# Patient Record
Sex: Female | Born: 1940
Health system: Southern US, Community
[De-identification: ages and names within clinical notes are randomized; demographics above are authoritative.]

## PROBLEM LIST (undated history)

## (undated) DIAGNOSIS — R413 Other amnesia: Secondary | ICD-10-CM

## (undated) DIAGNOSIS — M8580 Other specified disorders of bone density and structure, unspecified site: Secondary | ICD-10-CM

## (undated) DIAGNOSIS — E538 Deficiency of other specified B group vitamins: Secondary | ICD-10-CM

## (undated) DIAGNOSIS — K760 Fatty (change of) liver, not elsewhere classified: Secondary | ICD-10-CM

## (undated) DIAGNOSIS — T7840XA Allergy, unspecified, initial encounter: Secondary | ICD-10-CM

## (undated) DIAGNOSIS — E785 Hyperlipidemia, unspecified: Secondary | ICD-10-CM

## (undated) DIAGNOSIS — M1990 Unspecified osteoarthritis, unspecified site: Secondary | ICD-10-CM

## (undated) DIAGNOSIS — C8590 Non-Hodgkin lymphoma, unspecified, unspecified site: Secondary | ICD-10-CM

## (undated) DIAGNOSIS — K529 Noninfective gastroenteritis and colitis, unspecified: Secondary | ICD-10-CM

## (undated) DIAGNOSIS — K5790 Diverticulosis of intestine, part unspecified, without perforation or abscess without bleeding: Secondary | ICD-10-CM

## (undated) DIAGNOSIS — R011 Cardiac murmur, unspecified: Secondary | ICD-10-CM

## (undated) DIAGNOSIS — C801 Malignant (primary) neoplasm, unspecified: Secondary | ICD-10-CM

## (undated) DIAGNOSIS — G35 Multiple sclerosis: Secondary | ICD-10-CM

## (undated) DIAGNOSIS — K2970 Gastritis, unspecified, without bleeding: Secondary | ICD-10-CM

## (undated) DIAGNOSIS — B9681 Helicobacter pylori [H. pylori] as the cause of diseases classified elsewhere: Secondary | ICD-10-CM

## (undated) DIAGNOSIS — I10 Essential (primary) hypertension: Secondary | ICD-10-CM

## (undated) DIAGNOSIS — K219 Gastro-esophageal reflux disease without esophagitis: Secondary | ICD-10-CM

## (undated) DIAGNOSIS — L309 Dermatitis, unspecified: Secondary | ICD-10-CM

## (undated) HISTORY — DX: Cardiac murmur, unspecified: R01.1

## (undated) HISTORY — DX: Allergy, unspecified, initial encounter: T78.40XA

## (undated) HISTORY — DX: Noninfective gastroenteritis and colitis, unspecified: K52.9

## (undated) HISTORY — DX: Deficiency of other specified B group vitamins: E53.8

## (undated) HISTORY — DX: Diverticulosis of intestine, part unspecified, without perforation or abscess without bleeding: K57.90

## (undated) HISTORY — DX: Fatty (change of) liver, not elsewhere classified: K76.0

## (undated) HISTORY — PX: ROTATOR CUFF REPAIR: SHX139

## (undated) HISTORY — DX: Other specified disorders of bone density and structure, unspecified site: M85.80

## (undated) HISTORY — PX: CERVICAL SPINE SURGERY: SHX589

## (undated) HISTORY — DX: Dermatitis, unspecified: L30.9

## (undated) HISTORY — PX: HEMORRHOID SURGERY: SHX153

## (undated) HISTORY — DX: Gastritis, unspecified, without bleeding: K29.70

## (undated) HISTORY — PX: CHOLECYSTECTOMY: SHX55

## (undated) HISTORY — DX: Helicobacter pylori (H. pylori) as the cause of diseases classified elsewhere: B96.81

## (undated) HISTORY — PX: TUBAL LIGATION: SHX77

## (undated) HISTORY — DX: Other amnesia: R41.3

## (undated) HISTORY — DX: Gastro-esophageal reflux disease without esophagitis: K21.9

## (undated) HISTORY — DX: Essential (primary) hypertension: I10

## (undated) HISTORY — DX: Unspecified osteoarthritis, unspecified site: M19.90

## (undated) HISTORY — DX: Hyperlipidemia, unspecified: E78.5

## (undated) HISTORY — DX: Multiple sclerosis: G35

## (undated) HISTORY — PX: SPINAL FUSION: SHX223

---

## 1988-01-31 ENCOUNTER — Encounter: Payer: Self-pay | Admitting: Internal Medicine

## 1997-10-01 ENCOUNTER — Emergency Department (HOSPITAL_COMMUNITY): Admission: EM | Admit: 1997-10-01 | Discharge: 1997-10-01 | Payer: Self-pay | Admitting: Emergency Medicine

## 1998-02-22 ENCOUNTER — Other Ambulatory Visit: Admission: RE | Admit: 1998-02-22 | Discharge: 1998-02-22 | Payer: Self-pay | Admitting: Obstetrics and Gynecology

## 1998-08-21 ENCOUNTER — Ambulatory Visit (HOSPITAL_COMMUNITY): Admission: RE | Admit: 1998-08-21 | Discharge: 1998-08-21 | Payer: Self-pay | Admitting: Neurosurgery

## 1998-08-21 ENCOUNTER — Encounter: Payer: Self-pay | Admitting: Neurosurgery

## 1998-09-04 ENCOUNTER — Ambulatory Visit (HOSPITAL_COMMUNITY): Admission: RE | Admit: 1998-09-04 | Discharge: 1998-09-04 | Payer: Self-pay | Admitting: Neurosurgery

## 1998-09-04 ENCOUNTER — Encounter: Payer: Self-pay | Admitting: Neurosurgery

## 1998-09-27 ENCOUNTER — Ambulatory Visit (HOSPITAL_COMMUNITY): Admission: RE | Admit: 1998-09-27 | Discharge: 1998-09-27 | Payer: Self-pay | Admitting: Neurology

## 1999-03-18 ENCOUNTER — Other Ambulatory Visit: Admission: RE | Admit: 1999-03-18 | Discharge: 1999-03-18 | Payer: Self-pay | Admitting: Obstetrics and Gynecology

## 1999-03-22 ENCOUNTER — Encounter: Admission: RE | Admit: 1999-03-22 | Discharge: 1999-03-22 | Payer: Self-pay | Admitting: Obstetrics and Gynecology

## 1999-03-22 ENCOUNTER — Encounter: Payer: Self-pay | Admitting: Obstetrics and Gynecology

## 2000-01-16 ENCOUNTER — Encounter: Admission: RE | Admit: 2000-01-16 | Discharge: 2000-01-16 | Payer: Self-pay | Admitting: Family Medicine

## 2000-01-16 ENCOUNTER — Encounter: Payer: Self-pay | Admitting: Family Medicine

## 2000-02-24 ENCOUNTER — Other Ambulatory Visit: Admission: RE | Admit: 2000-02-24 | Discharge: 2000-02-24 | Payer: Self-pay | Admitting: Obstetrics and Gynecology

## 2000-03-23 ENCOUNTER — Encounter: Payer: Self-pay | Admitting: Obstetrics and Gynecology

## 2000-03-23 ENCOUNTER — Encounter: Admission: RE | Admit: 2000-03-23 | Discharge: 2000-03-23 | Payer: Self-pay | Admitting: Obstetrics and Gynecology

## 2001-02-26 ENCOUNTER — Other Ambulatory Visit: Admission: RE | Admit: 2001-02-26 | Discharge: 2001-02-26 | Payer: Self-pay | Admitting: Obstetrics and Gynecology

## 2001-03-25 ENCOUNTER — Encounter: Admission: RE | Admit: 2001-03-25 | Discharge: 2001-03-25 | Payer: Self-pay | Admitting: Obstetrics and Gynecology

## 2001-03-25 ENCOUNTER — Encounter: Payer: Self-pay | Admitting: Obstetrics and Gynecology

## 2001-04-05 ENCOUNTER — Encounter: Payer: Self-pay | Admitting: Internal Medicine

## 2003-03-02 ENCOUNTER — Ambulatory Visit (HOSPITAL_COMMUNITY): Admission: RE | Admit: 2003-03-02 | Discharge: 2003-03-02 | Payer: Self-pay | Admitting: Orthopedic Surgery

## 2003-03-02 ENCOUNTER — Ambulatory Visit (HOSPITAL_BASED_OUTPATIENT_CLINIC_OR_DEPARTMENT_OTHER): Admission: RE | Admit: 2003-03-02 | Discharge: 2003-03-02 | Payer: Self-pay | Admitting: Orthopedic Surgery

## 2004-05-03 ENCOUNTER — Ambulatory Visit: Payer: Self-pay | Admitting: Internal Medicine

## 2004-05-15 ENCOUNTER — Ambulatory Visit: Payer: Self-pay | Admitting: Internal Medicine

## 2004-05-29 ENCOUNTER — Ambulatory Visit: Payer: Self-pay | Admitting: Internal Medicine

## 2004-07-17 ENCOUNTER — Ambulatory Visit: Payer: Self-pay | Admitting: Internal Medicine

## 2004-09-06 ENCOUNTER — Ambulatory Visit: Payer: Self-pay | Admitting: Internal Medicine

## 2004-09-10 ENCOUNTER — Ambulatory Visit: Payer: Self-pay | Admitting: Internal Medicine

## 2004-10-17 ENCOUNTER — Ambulatory Visit: Payer: Self-pay | Admitting: Internal Medicine

## 2004-12-09 ENCOUNTER — Ambulatory Visit: Payer: Self-pay | Admitting: Internal Medicine

## 2005-02-06 ENCOUNTER — Ambulatory Visit: Payer: Self-pay | Admitting: Internal Medicine

## 2007-05-12 ENCOUNTER — Ambulatory Visit: Payer: Self-pay | Admitting: Internal Medicine

## 2007-05-27 ENCOUNTER — Encounter: Payer: Self-pay | Admitting: Internal Medicine

## 2007-05-27 ENCOUNTER — Ambulatory Visit: Payer: Self-pay | Admitting: Internal Medicine

## 2007-07-01 ENCOUNTER — Ambulatory Visit: Payer: Self-pay | Admitting: Internal Medicine

## 2008-04-14 HISTORY — PX: BLADDER SURGERY: SHX569

## 2008-04-14 HISTORY — PX: ABDOMINAL HYSTERECTOMY: SHX81

## 2008-11-17 ENCOUNTER — Telehealth: Payer: Self-pay | Admitting: Internal Medicine

## 2008-11-28 DIAGNOSIS — K5730 Diverticulosis of large intestine without perforation or abscess without bleeding: Secondary | ICD-10-CM | POA: Insufficient documentation

## 2008-11-28 DIAGNOSIS — K7689 Other specified diseases of liver: Secondary | ICD-10-CM | POA: Insufficient documentation

## 2008-11-28 DIAGNOSIS — K5289 Other specified noninfective gastroenteritis and colitis: Secondary | ICD-10-CM | POA: Insufficient documentation

## 2008-11-28 DIAGNOSIS — G35 Multiple sclerosis: Secondary | ICD-10-CM | POA: Insufficient documentation

## 2008-11-28 DIAGNOSIS — E785 Hyperlipidemia, unspecified: Secondary | ICD-10-CM | POA: Insufficient documentation

## 2008-11-28 DIAGNOSIS — Z8711 Personal history of peptic ulcer disease: Secondary | ICD-10-CM | POA: Insufficient documentation

## 2008-12-04 ENCOUNTER — Ambulatory Visit: Payer: Self-pay | Admitting: Internal Medicine

## 2008-12-04 DIAGNOSIS — R6889 Other general symptoms and signs: Secondary | ICD-10-CM | POA: Insufficient documentation

## 2008-12-06 ENCOUNTER — Ambulatory Visit (HOSPITAL_COMMUNITY): Admission: RE | Admit: 2008-12-06 | Discharge: 2008-12-06 | Payer: Self-pay | Admitting: Internal Medicine

## 2008-12-06 ENCOUNTER — Telehealth: Payer: Self-pay | Admitting: Internal Medicine

## 2008-12-13 ENCOUNTER — Encounter (INDEPENDENT_AMBULATORY_CARE_PROVIDER_SITE_OTHER): Payer: Self-pay | Admitting: Obstetrics and Gynecology

## 2008-12-13 ENCOUNTER — Ambulatory Visit (HOSPITAL_COMMUNITY): Admission: RE | Admit: 2008-12-13 | Discharge: 2008-12-14 | Payer: Self-pay | Admitting: Obstetrics and Gynecology

## 2009-11-27 ENCOUNTER — Emergency Department (HOSPITAL_BASED_OUTPATIENT_CLINIC_OR_DEPARTMENT_OTHER): Admission: EM | Admit: 2009-11-27 | Discharge: 2009-11-28 | Payer: Self-pay | Admitting: Emergency Medicine

## 2009-11-28 ENCOUNTER — Ambulatory Visit: Payer: Self-pay | Admitting: Diagnostic Radiology

## 2009-12-27 ENCOUNTER — Encounter: Admission: RE | Admit: 2009-12-27 | Discharge: 2009-12-27 | Payer: Self-pay | Admitting: Neurology

## 2010-01-01 ENCOUNTER — Telehealth (INDEPENDENT_AMBULATORY_CARE_PROVIDER_SITE_OTHER): Payer: Self-pay | Admitting: *Deleted

## 2010-05-14 NOTE — Progress Notes (Signed)
  Phone Note Other Incoming   Request: Send information Summary of Call: Request for records received from [place] [facility]. Request forwarded to [facility] for all of patient's records.

## 2010-06-28 LAB — DIFFERENTIAL
Eosinophils Absolute: 0.1 10*3/uL (ref 0.0–0.7)
Eosinophils Relative: 2 % (ref 0–5)
Lymphs Abs: 1.4 10*3/uL (ref 0.7–4.0)
Monocytes Absolute: 0.5 10*3/uL (ref 0.1–1.0)
Monocytes Relative: 7 % (ref 3–12)
Neutrophils Relative %: 73 % (ref 43–77)

## 2010-06-28 LAB — CBC
HCT: 35.6 % — ABNORMAL LOW (ref 36.0–46.0)
Hemoglobin: 12.4 g/dL (ref 12.0–15.0)
MCH: 32.6 pg (ref 26.0–34.0)
MCV: 93.3 fL (ref 78.0–100.0)
RBC: 3.82 MIL/uL — ABNORMAL LOW (ref 3.87–5.11)

## 2010-06-28 LAB — BASIC METABOLIC PANEL
CO2: 23 mEq/L (ref 19–32)
Chloride: 105 mEq/L (ref 96–112)
GFR calc Af Amer: >60 mL/min (ref >60)
Glucose, Bld: 170 mg/dL — ABNORMAL HIGH (ref 70–99)
Potassium: 4.2 mEq/L (ref 3.5–5.1)
Sodium: 140 mEq/L (ref 135–145)

## 2010-06-28 LAB — URINALYSIS, ROUTINE W REFLEX MICROSCOPIC
Bilirubin Urine: NEGATIVE
Glucose, UA: NEGATIVE mg/dL
Hgb urine dipstick: NEGATIVE
Specific Gravity, Urine: 1.013 (ref 1.005–1.030)

## 2010-07-19 LAB — CBC
MCHC: 33.8 g/dL (ref 30.0–36.0)
MCV: 93.6 fL (ref 78.0–100.0)
MCV: 94.2 fL (ref 78.0–100.0)
Platelets: 237 10*3/uL (ref 150–400)
RBC: 3.64 MIL/uL — ABNORMAL LOW (ref 3.87–5.11)
RDW: 13.3 % (ref 11.5–15.5)
WBC: 6.8 10*3/uL (ref 4.0–10.5)

## 2010-07-20 LAB — DIFFERENTIAL
Basophils Absolute: 0 10*3/uL (ref 0.0–0.1)
Basophils Relative: 0 % (ref 0–1)
Eosinophils Relative: 4 % (ref 0–5)
Monocytes Absolute: 0.3 10*3/uL (ref 0.1–1.0)

## 2010-07-20 LAB — COMPREHENSIVE METABOLIC PANEL
AST: 39 U/L — ABNORMAL HIGH (ref 0–37)
Albumin: 3.7 g/dL (ref 3.5–5.2)
Alkaline Phosphatase: 64 U/L (ref 39–117)
Chloride: 101 mEq/L (ref 96–112)
GFR calc Af Amer: >60 mL/min (ref >60)
Potassium: 3.6 mEq/L (ref 3.5–5.1)
Total Bilirubin: 0.7 mg/dL (ref 0.3–1.2)

## 2010-07-20 LAB — CBC
HCT: 38.4 % (ref 36.0–46.0)
Platelets: 240 10*3/uL (ref 150–400)
WBC: 4.7 10*3/uL (ref 4.0–10.5)

## 2010-08-27 NOTE — Op Note (Signed)
NAMECARMYN, [name]               ACCOUNT NO.:  [id number]   MEDICAL RECORD NO.:  [id number]          PATIENT TYPE:  OIB   LOCATION:  9313                          FACILITY:  [facility]   PHYSICIAN:  Lucille Passy. Ulanda Edison, M.D. DATE OF BIRTH:  1940/04/26   DATE OF PROCEDURE:  12/13/2008  DATE OF DISCHARGE:                               OPERATIVE REPORT   PREOPERATIVE DIAGNOSES:  Uterine prolapse, fourth-degree cystocele, and  second-degree rectocele.   POSTOPERATIVE DIAGNOSES:  Uterine prolapse, fourth-degree cystocele, and  second-degree rectocele.   OPERATION:  Vaginal hysterectomy, A and P repair.   OPERATOR:  Lucille Passy. Ulanda Edison, MD   ASSISTANT:  Clarene Duke, MD   ANESTHESIA:  General anesthesia.   PROCEDURE IN DETAIL:  The patient was brought to the operating room and  placed under satisfactory general anesthesia, and placed in lithotomy  position in the [place] stirrups.  The vulva, vagina, and perineum were  prepped with Betadine solution.  The bladder was catheterized after the  urethra was prepped with Betadine and the Foley catheter was hooked to  straight drain.  A time-out was done, verifying the patient's name and  operation, allergies, and potential complications.  The area was then  draped as a sterile field.  Exam revealed the preop findings, the cul-de-  sac still felt somewhat thick, so I wanted to be especially careful to  avoid any injury to the rectum.  A weighted speculum was placed along  with a Deaver.  Cervix was grasped with a Lahey clamps on the anterior  and posterior lips.  A dilute solution of Neo-Synephrine was injected at  the cervicovaginal junction and a circumferential incision was made  around the cervix.  The bladder was pushed ahead, I could not find a  perfect plain there, so I dissected posteriorly, saw the cul-de-sac  bulging, entered it without difficulty, clamped, cut, and suture ligated  the uterosacral ligaments and held them.  I clamped,  cut, and suture  ligated the cardinal ligaments.  I then was able to find the anterior  peritoneum without difficulty, entered it, retracted the bladder away,  and then continued up the parametrium tissues, clamping, cutting, and  suture ligating, and then inverted the uterus through the incision, and  the cul-de-sac clamped across the upper pedicles, doubly suture ligated  them and held.  I then sutured the posterior vaginal cuff with a running  suture of 0 Vicryl from uterosacral ligament through uterosacral  ligament including the pelvic peritoneum with the vaginal mucosa.  I  then placed an additional suture above the uterosacral ligaments through  the uterosacral ligaments and pelvic peritoneum.  After I done a  pursestring suture of #1 Vicryl through the anterior peritoneum, the  left upper pedicle, uterosacral ligament, pelvic peritoneum, right  uterosacral ligament, right upper pedicle, and back to the anterior  pelvic peritoneum.  Before I tied this down, I tied the suture through  the uterosacral ligaments down and held it to exited through the vagina  later.  I then pushed all material away from the peritoneal closure  site, tied it  down, and cut away the upper pedicles, and the closure  suture.  I then undermined the anterior vaginal mucosa all the way to  the urethral meatus, I developed a cystocele, which was fourth-degree,  it easily developed on the left side and the right side, finding the  proper plain was more difficult, but ultimately did find the exact  plain, created the cystocele without difficulty, then imbricated the  cystocele with multiple interrupted sutures of 0 Vicryl, cut redundant  vaginal mucosa way and then closed the vaginal mucosa in the midline  with interrupted figure of eight sutures of 0 Vicryl.  When I got down  to the inferior most portion of the cuff, I took suture through the  uterosacral ligaments and exited through the vaginal mucosa and tied  it  down to elevate the vaginal cuff up to the uterosacral ligaments.  Before closing the peritoneum, I did search for the ovaries, I can see  both tubes, the right ovary was palpable and was small; the left ovary I  never could palpate, I kept feeling when I thought were bowel  diverticula that I felt preoperatively, but I never could find the  ovary.  The posterior fourchette was then cut across the posterior  vaginal mucosa, it was undermined almost to the cuff.  I developed the  rectocele, fulgurated some bleeders with the Bovie and I sutured them  with 3-0 Vicryl.  Then, I did a rectal exam to ensure that they had been  no rectal injury and then imbricated over the rectocele with several  sutures of 0 Vicryl and cut redundant vaginal mucosa way and reunited  the vaginal mucosa in the midline with interrupted figure-of-eight  sutures of 0 Vicryl and reapproximated the perineal tissue with 3-0  Vicryl.  There was one bleeder close to the upper most part of the  anterior vaginal mucosa closure that I sutured with 3-0 Vicryl for  complete hemostasis.  I then packed the vaginal tissue with 2-inch  Iodoform dressing or packing and terminated the procedure.   Blood loss was estimated by Pathology staff at 150 mL.  I estimated 200  mL.  The patient was then returned to recovery in satisfactory  condition.      Lucille Passy. Ulanda Edison, M.D.  Electronically Signed     TFH/MEDQ  D:  12/13/2008  T:  12/13/2008  Job:  [id number]

## 2010-08-27 NOTE — H&P (Signed)
NAMEEDITHA, BRIDGEFORTH               ACCOUNT NO.:  [id number]   MEDICAL RECORD NO.:  [id number]          PATIENT TYPE:  AMB   LOCATION:  [place]                           FACILITY:  [facility]   PHYSICIAN:  Lucille Passy. Ulanda Edison, M.D. DATE OF BIRTH:  [birth year]   DATE OF ADMISSION:  12/13/2008  DATE OF DISCHARGE:                              HISTORY & PHYSICAL   PRESENT ILLNESS:  This is a 70 year old white female para 3-0-0-3 who is  admitted to the hospital for vaginal hysterectomy, A and P repair  because of uterine prolapse, fourth-degree cystocele, and smaller  rectocele.  This patient has had a relatively benign life with regard to  gynecologic abnormalities.  She did have lichen sclerosis diagnosed by  biopsy in the past, although otherwise has done well.  In June 2010, she  presented to my office complaining that she had more of a problem with  her cystocele in the last few days.  She had always had some stress  urinary incontinence for years, but this was not a problem.  She stated  that her urine flow was slow and after finishing voiding, she would  always have more urine to evacuate.  She does have a history of multiple  sclerosis.  Because of her somewhat minor urinary complaints, I asked  Dr. Diona Fanti to see her regarding whether a saline would be necessary  at the time of the vaginal hysterectomy and A and P repair.  After a  thorough investigation, he felt that a sling was not necessary.  The  patient is admitted now for correction of her cystocele and prolapse.   ALLERGIES:  MORPHINE, SULFA, MACROBID, and KEFLEX.   PAST MEDICAL HISTORY:  Multiple sclerosis.  Colitis.  She has also been  recently diagnosed with a problem with her esophagus that causes sort of  a balloon shape to the esophagus.   OPERATIONS:  Cholecystectomy, tubal ligation, D&C, hemorrhoidectomy.  Tobacco none.   MEDICATIONS:  Vitamin D, Boniva, betaserone, Tricor, gabapentin,  lovastatin, ranitidine, Allegra.   FAMILY HISTORY:  Mother is 15 with no health problems.  Father died of a  kidney infection and prostate problems.  Two brothers have high  cholesterol, 1 brother with high blood pressure.  One sister with high  cholesterol.   PHYSICAL EXAMINATION:  A well-developed, well-nourished white female in  no distress.  HEENT: Normal.  Blood pressure 140/90, pulse 80.  Weight 146-3/4 pounds.  NECK:  Supple without thyromegaly.  HEART:  Normal size and sounds.  No murmurs.  LUNGS:  Clear to auscultation.  Abdomen is soft, nontender.  The vulva and vagina are clean.  There is a fourth-degree cystocele.  The cervix is clean.  The cervix prolapses close to the introitus.  Pap  smear in December 2008 was normal, and the uterus itself is anterior not  enlarged.  There is thickening posterior to the cervix that is thought  to be secondary to diverticular disease.  Rectal exam is negative.   ADMITTING IMPRESSION:  Fourth-degree cystocele, third-degree uterine  prolapse, smaller rectocele.  Multiple sclerosis.   The patient is  admitted for vaginal hysterectomy, A and P repair.  She  has been counseled about the risks of surgery, including but not limited  to heart attack, stroke, pulmonary embolus, wound disruption, hemorrhage  with need for  reoperation and/or transfusion, fistula formation, nerve injury.  She  understands that the surgery will cause shortening and narrowing of the  vagina and might cause problems with intercourse.  She and her husband  have been counseled about this, and they understand and agree to  proceed.      Lucille Passy. Ulanda Edison, M.D.  Electronically Signed     TFH/MEDQ  D:  12/12/2008  T:  12/12/2008  Job:  [id number]

## 2010-08-27 NOTE — Assessment & Plan Note (Signed)
[facility] OFFICE NOTE   NAME:Beidleman, JORDAN CARAVEO                      MRN:          [id number]  DATE:07/01/2007                            DOB:          30-Oct-1940    The patient is a 70 year old white female with inflammatory bowel  disease positive for IBD markers and pseudopolyps on colonoscopy on  May 27, 2007.  She had prior colonoscopies in 2002 and 2006 which  also showed pseudopolyps showing inflammatory tissue and small ileocecal  valve ulceration.  She was in the past symptomatically relieved with  systemic steroids, but this has been discontinued.  She has multiple  sclerosis followed by Dr. Jacolyn Reedy.  The patient has been under  reasonable control with Betaseron and gabapentin.  She in the past was  on high dose steroids and during those times her colitis improved  remarkably well.  I have put her on Entocort 9 mg a day after her most  recent colonoscopy and she showed improvement in her symptoms of  diarrhea.  She is down to 6 mg a day and still is not having much  diarrhea at all.  She is planning to go to 3 mg a day on April 12.  Unfortunately the medication is quite expensive and so we probably will  not be able to keep her on it indefinitely.  Another medical problem is  a positive family history of colon cancer in a direct relative.  The  patient has fatty liver on upper abdominal ultrasound and she was  treated for H. Pylori gastropathy in the past.   PAST MEDICAL HISTORY:  Significant for cholecystectomy in 1986 and  hemorrhoidectomy around 2005.   PHYSICAL EXAMINATION:  VITAL SIGNS:  Blood pressure 128/80, pulse 64,  and weight 150 pounds.  GENERAL:  She was alert and oriented in no distress.  LUNGS:  Clear to auscultation.  HEART:  Normal S1 and normal S2.  ABDOMEN:  Soft and somewhat hypotonic with decreased muscle tone, but  normal active bowel sounds.  Liver edge was about 1-2 cm  below right  costal margin.  It was nontender and not enlarged.  There was no  tenderness.   RECOMMENDATIONS:  Not repeated.   IMPRESSION:  A 70 year old white female with inflammatory bowel disease,  low grade colitis, and multiple sclerosis improved on Entocort, but the  medication is quite expensive.   PLAN:  1. Switch to Asacol 400 mg three tablets twice a day longterm.  2. Consider using 6-mercaptopurine as immunomodulator for control of      her diarrhea.  3. Add probiotics, Align one daily.  4. I would like to see her in three months after she tapers off her      Entocort and starts the      Asacol.  If the Asacol does not completely control her symptoms, we      will try her on 6-mercaptopurine.     Lowella Bandy. Olevia Perches, MD  Electronically Signed    DMB/MedQ  DD: 07/01/2007  DT: 07/01/2007  Job #: [id number]   cc:   Josph Macho  Rosalva Ferron, M.D.  Catherine A. Jacolyn Reedy, M.D.

## 2010-08-27 NOTE — Discharge Summary (Signed)
NAMEKATARA, GRINER               ACCOUNT NO.:  [id number]   MEDICAL RECORD NO.:  [id number]          PATIENT TYPE:  OIB   LOCATION:  9313                          FACILITY:  [facility]   PHYSICIAN:  Lucille Passy. Ulanda Edison, M.D. DATE OF BIRTH:  [birth year]   DATE OF ADMISSION:  12/13/2008  DATE OF DISCHARGE:  12/14/2008                               DISCHARGE SUMMARY   This is a 70 year old white female admitted for vaginal hysterectomy, A  and P repair because of a large cystocele, uterine prolapse and smaller  rectocele.  She underwent a vaginal hysterectomy, A and P repair, on  December 13, 2008, by Dr. Ulanda Edison with Dr. Willis Modena assisting under  general anesthesia with blood loss of about 200 mL.  Postoperatively,  she did well.  The catheter was removed on the first postop day.  She  has voided, but residual urine by scan was approximately 245 mL on the  first postop day.  The patient is ready for discharge.  She has  ambulated well without difficulty, tolerated a regular diet, but she  will go home with the catheter if a repeat scan is greater than 200 mL.   LABORATORY DATA:  A comprehensive metabolic profile that was not  fasting, showed a glucose of 147, SGOT of 39, sodium of 134.  Initial  hemoglobin 13, hematocrit 38.4, white count 47,100, platelet count  240,000.  Followup hemoglobins 11.5 and 11.3.   FINAL DIAGNOSES:  Fourth-degree cystocele, smaller rectocele, uterine  prolapse.   OPERATION:  Vaginal hysterectomy, A and P repair.   FINAL CONDITION:  Improved.   INSTRUCTIONS:  Our regular discharge instructions, report any  temperature elevation greater than 100.4 degrees, report any heavy  vaginal bleeding, avoid vaginal entrance, avoid heavy lifting.  The  patient will be discharged with a catheter if her residual urine is  greater than 200 mL on repeat scanning.  She will be discharged without  a catheter if the residual urine is less than 200 mL.  She is given a  prescription  of Percocet 5/335, 30 tablets, 1 every 4-6 hours as needed  for pain and if she goes home with a catheter, she will be given Cipro  500 mg by mouth twice a day for 7 days.  She is to continue all the  medication she came to the hospital with.  She will return in 5 days  with a catheter or 10-14 days without it.      Lucille Passy. Ulanda Edison, M.D.  Electronically Signed     TFH/MEDQ  D:  12/14/2008  T:  12/15/2008  Job:  [id number]

## 2010-08-30 NOTE — Op Note (Signed)
[name], [name]                         ACCOUNT NO.:  [id number]   MEDICAL RECORD NO.:  [id number]                   PATIENT TYPE:  AMB   LOCATION:  [place]                                  FACILITY:  [facility]   PHYSICIAN:  Ninetta Lights, M.D.              DATE OF BIRTH:  1940/11/18   DATE OF PROCEDURE:  03/02/2003  DATE OF DISCHARGE:                                 OPERATIVE REPORT   PREOPERATIVE DIAGNOSIS:  Marked symptomatic triggering, left thumb.   POSTOPERATIVE DIAGNOSES:  Marked symptomatic triggering, left thumb with  marked intratendinous abrasive partial tearing, flexor tendons thumb.   OPERATIVE PROCEDURE:  Release A1 pulley to obliterate triggering left thumb.  Tenosynovectomy and debridement of flexor tendons.   SURGEON:  Ninetta Lights, M.D.   ASSISTANT:  Aaron Edelman D. Petrarca, P.A.-C.   ANESTHESIA:  A. regional.   SPECIMENS:  None.   CULTURES:  None.   COMPLICATIONS:  None.   DRESSINGS:  Soft compressive with bulky hand dressing and thumb spica  splint.   PROCEDURE:  The patient was brought to the operating room and after adequate  anesthesia had been obtained, prepped and draped in usual sterile fashion.  Transverse incision over the volar aspect of the thumb A1 pulley.  The skin  and subcutaneous tissue divided.  Blunt dissection exposing the A1 pulley.  Marked inflammatory debris.  Neurovascular structures identified and  protected.  The A1 pulley was then released in its entirety with a small  section excised.  Below this, there was marked attritional changes and  tenosynovitis along the flexor tendons.  All the small flaps and frayed  tears on the tendon debrided.  Tenosynovectomy throughout.  The remaining  tendons, which had more than 75% of continuity intact were then inspected.  She could actively extend and flex, confirming all obliteration of  triggering and confirming good continuity of remaining tendons.  Wound  irrigated.  Skin closed with  nylon.  The margins of the wound were injected  with Marcaine.  Sterile compressive dressing with thumb spica splint and  bulky hand dressing applied.  Anesthesia reversed.  Brought to Recovery  Room.  Tolerated surgery well.  No complications.                                               Ninetta Lights, M.D.    DFM/MEDQ  D:  03/02/2003  T:  03/03/2003  Job:  [id number]

## 2011-03-13 ENCOUNTER — Ambulatory Visit (INDEPENDENT_AMBULATORY_CARE_PROVIDER_SITE_OTHER): Payer: Medicare Other | Admitting: Internal Medicine

## 2011-03-13 ENCOUNTER — Encounter: Payer: Self-pay | Admitting: Internal Medicine

## 2011-03-13 DIAGNOSIS — R109 Unspecified abdominal pain: Secondary | ICD-10-CM

## 2011-03-13 DIAGNOSIS — R160 Hepatomegaly, not elsewhere classified: Secondary | ICD-10-CM

## 2011-03-13 DIAGNOSIS — R197 Diarrhea, unspecified: Secondary | ICD-10-CM

## 2011-03-13 DIAGNOSIS — K5289 Other specified noninfective gastroenteritis and colitis: Secondary | ICD-10-CM

## 2011-03-13 DIAGNOSIS — R110 Nausea: Secondary | ICD-10-CM

## 2011-03-13 MED ORDER — DICYCLOMINE HCL 10 MG PO CAPS: 10.0000 mg | ORAL_CAPSULE | Freq: Two times a day (BID) | ORAL | Status: DC

## 2011-03-13 NOTE — Patient Instructions (Signed)
We have sent the following medications to your pharmacy for you to pick up at your convenience: Bentyl You have been scheduled for an endoscopy. Please follow written instructions given to you at your visit today. You have been scheduled for an abdominal ultrasound at [facility] (1st floor of hospital) on Monday 03/17/11 at 8:00 am. Please arrive 15 minutes prior to your appointment for registration. Make certain not to have anything to eat or drink 6 hours prior to your appointment. Should you need to reschedule your appointment, please contact radiology at [phone]. CC :Dr Stephanie Acre

## 2011-03-13 NOTE — Progress Notes (Signed)
[name] [birth year] MRN [id number]    History of Present Illness:  This is a 70 year old white female with multiple sclerosis treated with interferon. She also has inflammatory bowel disease and positive IBD markers. She had multiple inflammatory polyps on colonoscopies in 2006 and again in February 2009. She is currently not being treated for her colitis because she did not tolerate mesalamine. She has been experiencing more crampy abdominal pain and some diarrhea. She denies rectal bleeding. She has a history of diverticulitis documented on a CT scan of the abdomen in August 2011 which showed stranding in the left colon consistent with diverticulitis. There is a positive family history of colon cancer. She has a history of mild hepatosplenomegaly on an upper abdominal ultrasound in 2006 and mild fatty liver. She denies alcohol use. She had an upper endoscopy in 1989 with findings of gastritis and duodenitis. She has had some solid food dysphagia. She also has a history of B12 deficiency. She has been on Zantac for reflux. Patient has been concerned about possible liver problems because she has noticed white-colored stool while on a trip last month.   Past Medical History  Diagnosis Date  . Multiple sclerosis   . Gastritis   . Duodenitis   . Helicobacter pylori gastritis   . Hyperlipidemia   . Fatty liver   . Diverticulosis   . Hypertension   . IBD (inflammatory bowel disease)    Past Surgical History  Procedure Date  . Cholecystectomy   . Hemorrhoid surgery   . Tubal ligation   . Spinal fusion   . Rotator cuff repair   . Abdominal hysterectomy 2010  . Bladder surgery 2010    Bladder Tack    reports that she has quit smoking. She has never used smokeless tobacco. She reports that she drinks alcohol. She reports that she does not use illicit drugs. family history includes Breast cancer in her cousin; Colon cancer in her maternal grandmother; Colon polyps in her brother and  mother; Diabetes in her father; and Heart disease in her father. Allergies  Allergen Reactions  . Amoxicillin     REACTION: hives  . Doxycycline     Raw mouth   . Erythromycin     REACTION: weakness  . Morphine   . Sulfonamide Derivatives         Review of Systems: Positive for dysphagia. Negative for chest pain or shortness of breath. Positive for heartburn. Negative for rectal bleeding  The remainder of the 10 point ROS is negative except as outlined in H&P   Physical Exam: General appearance  Well developed, in no distress. Eyes- non icteric. HEENT nontraumatic, normocephalic. Mouth no lesions, tongue papillated, no cheilosis. Neck supple without adenopathy, thyroid not enlarged, no carotid bruits, no JVD. Lungs Clear to auscultation bilaterally. Cor normal S1, normal S2, regular rhythm, no murmur,  quiet precordium. Abdomen: Soft abdomen mildly tender in left lower quadrant. Pulse cholecystectomy scar in right upper quadrant. Mild hepatomegaly with liver edge 2 cm below right costal margin. No splenomegaly. Rectal: liquidy Hemoccult-negative stool. Extremities no pedal edema. Skin no lesions. Neurological alert and oriented x 3. Psychological normal mood and affect.  Assessment and Plan:  Problem #1 crampy abdominal pain. Patient has low-grade inflammatory bowel disease. Patient is intolerant to mesalamine.Shehas never been treated  For any length of time because her disease was found incidentally on a screening colonoscopy and she was never interested in a long term treatment. We will start Bentyl 10 mg by  mouth twice a day. She is up-to-date on her colonoscopy.  Problem #2 dysphagia and heartburn. This may be further investigated. We need to rule out pernicious anemia resulting in B12 deficiency. We also need to rule out esophageal varices and H. pylori. We will schedule an upper endoscopy and possible dilation.  Problem #3 hepatomegaly. This was noted 6 years ago.  Etiology is not clear although it may be simply autoimmune. We will obtain an upper abdominal ultrasound and liver function tests. She had lab work done at dr [employer]  office.   03/13/2011 Delfin Edis

## 2011-03-17 ENCOUNTER — Ambulatory Visit (HOSPITAL_COMMUNITY)
Admission: RE | Admit: 2011-03-17 | Discharge: 2011-03-17 | Disposition: A | Discharge status: Home / Self Care | Payer: Medicare Other | Source: Ambulatory Visit | Attending: Internal Medicine | Admitting: Internal Medicine

## 2011-03-17 DIAGNOSIS — Q619 Cystic kidney disease, unspecified: Secondary | ICD-10-CM | POA: Insufficient documentation

## 2011-03-17 DIAGNOSIS — K7689 Other specified diseases of liver: Secondary | ICD-10-CM | POA: Insufficient documentation

## 2011-03-17 DIAGNOSIS — R109 Unspecified abdominal pain: Secondary | ICD-10-CM | POA: Insufficient documentation

## 2011-03-17 DIAGNOSIS — R110 Nausea: Secondary | ICD-10-CM

## 2011-03-17 NOTE — Progress Notes (Signed)
Left message for patient to call back  

## 2011-03-17 NOTE — Progress Notes (Signed)
Patient advised of fatty liver and that her spleen and liver are no longer enlarged. Patient verbalizes understanding.

## 2011-03-18 ENCOUNTER — Ambulatory Visit (AMBULATORY_SURGERY_CENTER): Payer: Medicare Other | Admitting: Internal Medicine

## 2011-03-18 ENCOUNTER — Encounter: Payer: Self-pay | Admitting: Internal Medicine

## 2011-03-18 DIAGNOSIS — K2940 Chronic atrophic gastritis without bleeding: Secondary | ICD-10-CM

## 2011-03-18 DIAGNOSIS — K2100 Gastro-esophageal reflux disease with esophagitis, without bleeding: Secondary | ICD-10-CM

## 2011-03-18 DIAGNOSIS — R197 Diarrhea, unspecified: Secondary | ICD-10-CM

## 2011-03-18 DIAGNOSIS — K2980 Duodenitis without bleeding: Secondary | ICD-10-CM

## 2011-03-18 DIAGNOSIS — R110 Nausea: Secondary | ICD-10-CM

## 2011-03-18 DIAGNOSIS — R109 Unspecified abdominal pain: Secondary | ICD-10-CM

## 2011-03-18 MED ORDER — SODIUM CHLORIDE 0.9 % IV SOLN: 500.0000 mL | INTRAVENOUS | Status: DC

## 2011-03-18 NOTE — Patient Instructions (Signed)
Read the handout given to you by your recovery room nurse.   You may resume your routine medications today.   Your biopsy results will be mailed to you within two weeks.   If you have any questions, please call us at [phone]. Thank-you.

## 2011-03-18 NOTE — Progress Notes (Signed)
Patient did not have preoperative order for IV antibiotic SSI prophylaxis. (G8918)  Patient did not experience any of the following events: a burn prior to discharge; a fall within the facility; wrong site/side/patient/procedure/implant event; or a hospital transfer or hospital admission upon discharge from the facility. (G8907)  

## 2011-03-19 ENCOUNTER — Telehealth: Payer: Self-pay | Admitting: *Deleted

## 2011-03-19 NOTE — Telephone Encounter (Signed)
CALLED NUMBER GIVEN IN ADMISSION, NO ANSWER, CALLED MOBILE NUMBER, IDENTIFIER ON ANSWERING MACHINE, MESSAGE LEFT FOR PATIENT.

## 2011-03-24 ENCOUNTER — Encounter: Payer: Self-pay | Admitting: Internal Medicine

## 2011-04-11 ENCOUNTER — Encounter: Payer: Self-pay | Admitting: Internal Medicine

## 2012-12-22 ENCOUNTER — Encounter: Payer: Self-pay | Admitting: Neurology

## 2013-01-05 ENCOUNTER — Ambulatory Visit (INDEPENDENT_AMBULATORY_CARE_PROVIDER_SITE_OTHER): Payer: Medicare Other | Admitting: Neurology

## 2013-01-05 ENCOUNTER — Encounter: Payer: Self-pay | Admitting: Neurology

## 2013-01-05 VITALS — BP 119/72 | HR 80 | Wt 142.0 lb

## 2013-01-05 DIAGNOSIS — R413 Other amnesia: Secondary | ICD-10-CM

## 2013-01-05 DIAGNOSIS — Z5181 Encounter for therapeutic drug level monitoring: Secondary | ICD-10-CM

## 2013-01-05 DIAGNOSIS — G35 Multiple sclerosis: Secondary | ICD-10-CM

## 2013-01-05 HISTORY — DX: Other amnesia: R41.3

## 2013-01-05 MED ORDER — MEMANTINE HCL 10 MG PO TABS: 10.0000 mg | ORAL_TABLET | Freq: Two times a day (BID) | ORAL | Status: DC

## 2013-01-05 NOTE — Progress Notes (Signed)
Reason for visit: Multiple sclerosis  [name] is an 72 y.o. female  History of present illness:  Ms. Well is a 72 year old left-handed white female with a history of multiple sclerosis and a history of a mild memory disturbance. The patient had been on an Exelon patch, but she developed problems with diarrhea. The patient has a history of colitis as well, and a history of a low vitamin B12 level. The patient felt that the Exelon patch did help her memory, and when she went off the medication, she has felt a decline in her ability to concentrate and a decline in her general memory. The patient has not noted any definite changes in her functional level with her multiple sclerosis. There have been no new problems with numbness, weakness, coordination, or with balance. The patient denies any new visual disturbances, and she has been followed by her ophthalmologist on the [place]. The patient is tolerating the Gilenya quite well. The patient reports some discomfort and irritation in the medial aspect of the right eye within the last week. The patient has also noted some decrease in hearing of the right ear. This has been noted since December 2013. The patient denies any ear pain. The patient returns for an evaluation.  Past Medical History  Diagnosis Date  . Multiple sclerosis   . Gastritis   . Duodenitis   . Helicobacter pylori gastritis   . Hyperlipidemia   . Fatty liver   . Diverticulosis   . Hypertension   . IBD (inflammatory bowel disease)   . Arthritis, degenerative     both knees  . Memory loss     mild  . Colitis   . Memory difficulty 01/05/2013    Past Surgical History  Procedure Laterality Date  . Cholecystectomy    . Hemorrhoid surgery    . Tubal ligation    . Spinal fusion    . Rotator cuff repair    . Abdominal hysterectomy  2010  . Bladder surgery  2010    Bladder Tack  . Cervical spine surgery      Family History  Problem Relation Age of Onset  .  Diabetes Father   . Heart disease Father   . Breast cancer Cousin   . Colon cancer Maternal Grandmother     ?  . Colon polyps Mother   . Colon polyps Brother   . Hypertension Brother     Social history:  reports that she has quit smoking. She has never used smokeless tobacco. She reports that  drinks alcohol. She reports that she does not use illicit drugs.    Allergies  Allergen Reactions  . Amoxicillin     REACTION: hives  . Doxycycline     Raw mouth   . Erythromycin     REACTION: weakness  . Macrobid [Nitrofurantoin Macrocrystal]   . Morphine   . Sulfonamide Derivatives     Medications:  Current Outpatient Prescriptions on File Prior to Visit  Medication Sig Dispense Refill  . B Complex Vitamins (VITAMIN B COMPLEX PO) Take 1 tablet by mouth daily.        . benazepril (LOTENSIN) 20 MG tablet Take 20 mg by mouth daily.        [name] Kitchen gabapentin (NEURONTIN) 600 MG tablet Take 600 mg by mouth 3 (three) times daily.        . Glucosamine 500 MG TABS Take 1 tablet by mouth 2 (two) times daily.       [name] Kitchen  lovastatin (MEVACOR) 40 MG tablet Take 40 mg by mouth 2 (two) times daily.        . Omega-3 Fatty Acids (FISH OIL) 1200 MG CAPS Take 1 capsule by mouth daily.        . Probiotic Product (PROBIOTIC FORMULA PO) Take 1 tablet by mouth daily.        . vitamin B-12 (CYANOCOBALAMIN) 1000 MCG tablet Take 1,000 mcg by mouth daily.         No current facility-administered medications on file prior to visit.    ROS:  Out of a complete 14 system review of symptoms, the patient complains only of the following symptoms, and all other reviewed systems are negative.  Fatigue Hearing loss, right ear Blurred vision, dry eyes, eye pain Diarrhea Feeling cold Runny nose Memory loss, numbness in the hands  Blood pressure 119/72, pulse 80, weight 142 lb (64.411 kg).  Physical Exam  General: The patient is alert and cooperative at the time of the examination. Tympanic membranes are clear  bilaterally.  Skin: No significant peripheral edema is noted.   Neurologic Exam  Mental status: Mini-Mental status examination done today shows a total score of 26/30.  Cranial nerves: Facial symmetry is present. Speech is normal, no aphasia or dysarthria is noted. Extraocular movements are full. Visual fields are full. Mild redness of the sclera in the right eye medially is noted. This is not present on the left.  Motor: The patient has good strength in all 4 extremities.  Coordination: The patient has good finger-nose-finger and heel-to-shin bilaterally.  Gait and station: The patient has a normal gait. Tandem gait is minimally unsteady. Romberg is negative. No drift is seen.  Reflexes: Deep tendon reflexes are symmetric.   Assessment/Plan:  One. Multiple sclerosis  2. Mild memory disturbance  3. History of vitamin B12 deficiency  The patient reports some changes in her hearing, and it would be unusual for this to be related to multiple sclerosis, and I have recommended that she seek out an audiologist. The patient will be switched from Exelon to [place] at this time. The patient has colitis, and she could not tolerate the Exelon. The patient will continue the Gilenya, blood work will be done today. We will continue to follow the mild memory issues. The patient will followup in 6-8 months.   Jill Alexanders MD 01/05/2013 8:45 AM  [facility] [address] [place], [place] [address]  Phone [phone] Fax [phone]

## 2013-01-06 ENCOUNTER — Telehealth: Payer: Self-pay | Admitting: Neurology

## 2013-01-06 LAB — CBC WITH DIFFERENTIAL
Basos: 0 %
Eos: 3 %
HCT: 40.1 % (ref 34.0–46.6)
Hemoglobin: 13.2 g/dL (ref 11.1–15.9)
Lymphocytes Absolute: 0.3 10*3/uL — ABNORMAL LOW (ref 0.7–3.1)
MCH: 30.8 pg (ref 26.6–33.0)
MCV: 94 fL (ref 79–97)
Monocytes: 6 %
Neutrophils Relative %: 82 %
Platelets: 241 10*3/uL (ref 150–379)
RBC: 4.29 x10E6/uL (ref 3.77–5.28)
WBC: 3.1 10*3/uL — ABNORMAL LOW (ref 3.4–10.8)

## 2013-01-06 LAB — COMPREHENSIVE METABOLIC PANEL
AST: 28 IU/L (ref 0–40)
Albumin: 4.3 g/dL (ref 3.5–4.8)
Alkaline Phosphatase: 62 IU/L (ref 39–117)
BUN/Creatinine Ratio: 25 (ref 11–26)
BUN: 16 mg/dL (ref 8–27)
CO2: 29 mmol/L (ref 18–29)
Calcium: 10 mg/dL (ref 8.6–10.2)
Chloride: 103 mmol/L (ref 96–108)
Creatinine, Ser: 0.65 mg/dL (ref 0.57–1.00)
Globulin, Total: 3 g/dL (ref 1.5–4.5)
Potassium: 4 mmol/L (ref 3.5–5.2)

## 2013-01-06 NOTE — Telephone Encounter (Signed)
I called the patient. The blood work is unremarkable, white blood count is slightly low at 3.1, but this is expected on Gilenya.

## 2013-01-07 ENCOUNTER — Other Ambulatory Visit: Payer: Self-pay

## 2013-01-07 MED ORDER — FINGOLIMOD HCL 0.5 MG PO CAPS: 0.5000 mg | ORAL_CAPSULE | Freq: Every day | ORAL | Status: DC

## 2013-04-11 ENCOUNTER — Telehealth: Payer: Self-pay | Admitting: Neurology

## 2013-04-11 NOTE — Telephone Encounter (Signed)
Patient has new ins. She is faxing over ins info, she needs Gilenya refill ordered.

## 2013-04-18 MED ORDER — FINGOLIMOD HCL 0.5 MG PO CAPS: 0.5000 mg | ORAL_CAPSULE | Freq: Every day | ORAL | Status: DC

## 2013-04-19 ENCOUNTER — Telehealth: Payer: Self-pay | Admitting: *Deleted

## 2013-04-19 MED ORDER — GABAPENTIN 600 MG PO TABS: 600.0000 mg | ORAL_TABLET | Freq: Three times a day (TID) | ORAL | Status: DC

## 2013-04-19 MED ORDER — MEMANTINE HCL 10 MG PO TABS: 10.0000 mg | ORAL_TABLET | Freq: Two times a day (BID) | ORAL | Status: DC

## 2013-04-19 NOTE — Telephone Encounter (Signed)
All info was already submitted to ins and approved PA Case [id number] effective 04/15/2013.  I called the patient.  She is aware.  She would also like her Namenda and Gabapentin sent to mail order.  I have sent Rx's per patient request.

## 2013-04-20 ENCOUNTER — Telehealth: Payer: Self-pay | Admitting: Neurology

## 2013-04-20 NOTE — Telephone Encounter (Signed)
Sheena with [facility] needs to confirm patient has been on Gilenya before mailing prescription to patient's home.  Please call [phone] to confirm.

## 2013-04-21 NOTE — Telephone Encounter (Signed)
I called back.  Spoke with [employer].  I verified the patient has been taking Gilenya and this is not a new start.  They will note this on her records and contact the patient to schedule shipment.

## 2013-04-26 ENCOUNTER — Telehealth: Payer: Self-pay | Admitting: Internal Medicine

## 2013-04-26 NOTE — Telephone Encounter (Signed)
LVOM FOR PT TO RETURN CALL IN RE REFERRAL  PT SCHEDULE TO SEE DR. Juliann Mule 01/23 @ 9:30.  WELCOME PACKET MAILED.

## 2013-04-26 NOTE — Telephone Encounter (Signed)
C/D 04/26/13 for appt. 05/06/13

## 2013-04-28 ENCOUNTER — Telehealth: Payer: Self-pay | Admitting: Internal Medicine

## 2013-04-28 NOTE — Telephone Encounter (Signed)
S/w pt and gve np appt 01/23 @ 9:30 w/Dr. Juliann Mule  Referring Dr. Jonathon Jordan Dx- Evaluate/treat low wbc, elevated lymphocytes Welcome packet emailed

## 2013-05-06 ENCOUNTER — Encounter: Payer: Self-pay | Admitting: Internal Medicine

## 2013-05-06 ENCOUNTER — Other Ambulatory Visit (HOSPITAL_BASED_OUTPATIENT_CLINIC_OR_DEPARTMENT_OTHER): Payer: Medicare HMO

## 2013-05-06 ENCOUNTER — Ambulatory Visit (HOSPITAL_BASED_OUTPATIENT_CLINIC_OR_DEPARTMENT_OTHER): Payer: Medicare HMO | Admitting: Internal Medicine

## 2013-05-06 ENCOUNTER — Other Ambulatory Visit: Payer: Self-pay | Admitting: Internal Medicine

## 2013-05-06 ENCOUNTER — Telehealth: Payer: Self-pay | Admitting: Internal Medicine

## 2013-05-06 ENCOUNTER — Ambulatory Visit (HOSPITAL_BASED_OUTPATIENT_CLINIC_OR_DEPARTMENT_OTHER): Payer: Medicare PPO

## 2013-05-06 ENCOUNTER — Encounter (INDEPENDENT_AMBULATORY_CARE_PROVIDER_SITE_OTHER): Payer: Self-pay

## 2013-05-06 VITALS — BP 131/69 | HR 81 | Temp 97.9°F | Resp 18 | Ht 66.5 in | Wt 139.9 lb

## 2013-05-06 DIAGNOSIS — K5730 Diverticulosis of large intestine without perforation or abscess without bleeding: Secondary | ICD-10-CM

## 2013-05-06 DIAGNOSIS — D72819 Decreased white blood cell count, unspecified: Secondary | ICD-10-CM

## 2013-05-06 DIAGNOSIS — K7689 Other specified diseases of liver: Secondary | ICD-10-CM

## 2013-05-06 DIAGNOSIS — D72820 Lymphocytosis (symptomatic): Secondary | ICD-10-CM

## 2013-05-06 DIAGNOSIS — G35 Multiple sclerosis: Secondary | ICD-10-CM

## 2013-05-06 LAB — CBC WITH DIFFERENTIAL/PLATELET
BASO%: 0.3 % (ref 0.0–2.0)
Basophils Absolute: 0 10*3/uL (ref 0.0–0.1)
EOS%: 4 % (ref 0.0–7.0)
Eosinophils Absolute: 0.1 10*3/uL (ref 0.0–0.5)
HEMATOCRIT: 39.6 % (ref 34.8–46.6)
HGB: 13.4 g/dL (ref 11.6–15.9)
LYMPH%: 6.4 % — ABNORMAL LOW (ref 14.0–49.7)
MCH: 32.1 pg (ref 25.1–34.0)
MCHC: 33.9 g/dL (ref 31.5–36.0)
MCV: 94.6 fL (ref 79.5–101.0)
MONO#: 0.4 10*3/uL (ref 0.1–0.9)
MONO%: 10.7 % (ref 0.0–14.0)
NEUT#: 2.9 10*3/uL (ref 1.5–6.5)
NEUT%: 78.6 % — AB (ref 38.4–76.8)
PLATELETS: 252 10*3/uL (ref 145–400)
RBC: 4.19 10*6/uL (ref 3.70–5.45)
RDW: 13.3 % (ref 11.2–14.5)
WBC: 3.7 10*3/uL — ABNORMAL LOW (ref 3.9–10.3)
lymph#: 0.2 10*3/uL — ABNORMAL LOW (ref 0.9–3.3)

## 2013-05-06 LAB — COMPREHENSIVE METABOLIC PANEL (CC13)
ALT: 16 U/L (ref 0–55)
ANION GAP: 11 meq/L (ref 3–11)
AST: 24 U/L (ref 5–34)
Albumin: 4.1 g/dL (ref 3.5–5.0)
Alkaline Phosphatase: 49 U/L (ref 40–150)
BILIRUBIN TOTAL: 0.58 mg/dL (ref 0.20–1.20)
BUN: 21.6 mg/dL (ref 7.0–26.0)
CO2: 26 mEq/L (ref 22–29)
Calcium: 10 mg/dL (ref 8.4–10.4)
Chloride: 104 mEq/L (ref 98–109)
Creatinine: 0.8 mg/dL (ref 0.6–1.1)
Glucose: 119 mg/dl (ref 70–140)
Potassium: 3.9 mEq/L (ref 3.5–5.1)
Sodium: 141 mEq/L (ref 136–145)
Total Protein: 7.7 g/dL (ref 6.4–8.3)

## 2013-05-06 LAB — LACTATE DEHYDROGENASE (CC13): LDH: 125 U/L (ref 125–245)

## 2013-05-06 NOTE — Telephone Encounter (Signed)
gv pt appt schedule for july. pof did not come across and was obtained from chart note. per pof pt will f/u in 46mo and have lb drawn at primary's office and [place] script from provider.

## 2013-05-06 NOTE — Progress Notes (Signed)
Checked in new patient with no financial issues. She has appt card and has not been to [place].

## 2013-05-06 NOTE — Patient Instructions (Addendum)
1. Please obtain repeat CBC with differential in 3 months and faxed to our office.  2. RTC in 6 months.  Fingolimod oral capsules What is this medicine? FINGOLIMOD (fin GOL i mod) may help prevent relapses of multiple sclerosis. This medicine is not a cure. This medicine may be used for other purposes; ask your health care provider or pharmacist if you have questions. COMMON BRAND NAME(S): Gilenya What should I tell my health care provider before I take this medicine? They need to know if you have any of these conditions: -diabetes -heart disease -high blood pressure -history of irregular heartbeat -history of stroke -immune system problems -infection (especially a virus infection such as chickenpox, cold sores, or herpes) -liver disease -low blood counts, like low white cell, platelet, or red cell counts -lung or breathing disease, like asthma -uveitis -an unusual or allergic reaction to fingolimod, other medicines, foods, dyes, or preservatives -pregnant or trying to get pregnant -breast-feeding How should I use this medicine? Take this medicine by mouth with a glass of water. Follow the directions on the prescription label. You can take it with or without food. If it upsets your stomach, take it with food. Take your medicine at regular intervals. Do not take it more often than directed. Do not stop taking except on your doctor's advice. A special MedGuide will be given to you by the pharmacist with each prescription and refill. Be sure to read this information carefully each time. Talk to your pediatrician regarding the use of this medicine in children. Special care may be needed. Overdosage: If you think you've taken too much of this medicine contact a poison control center or emergency room at once. Overdosage: If you think you have taken too much of this medicine contact a poison control center or emergency room at once. NOTE: This medicine is only for you. Do not share this  medicine with others. What if I miss a dose? It is important not to miss any doses. If you miss a dose, take it as soon as you can. If it is almost time for your next dose, take only that dose. Do not take double or extra doses. What may interact with this medicine? Do not take this medicine with any of the following medications: -arsenic trioxide -certain antipsychotics like pimozide, thioridazine, ziprasidone -certain medicines for irregular heart beat like amiodarone, disopyramide, dofetilide, ibutilide, procainamide, propafenone, quinidine, sotalol -certain medicines used for nausea like chlorpromazine, droperidol -certain medicines used to treat infections like chloroquine, clarithromycin, erythromycin, pentamidine -cisapride -dextromethorphan; quinidine -dronedarone -methadone -posaconazole -saquinavir  This medicine may also interact with the following medications: -beta-blockers like metoprolol and propranolol -citalopram -digoxin -diltiazem -haloperidol -ketoconazole -live virus vaccines -medicines that lower your chance of fighting infection -mitoxantrone -natalizumab -verapamil This list may not describe all possible interactions. Give your health care provider a list of all the medicines, herbs, non-prescription drugs, or dietary supplements you use. Also tell them if you smoke, drink alcohol, or use illegal drugs. Some items may interact with your medicine. What should I watch for while using this medicine? Tell your doctor or healthcare professional if your symptoms do not start to get better or if they get worse. After the first dose, you will be watched for at least 6 hours. If you miss more than 1 dose within the first 2 weeks of treatment, if you do not take it for more than 7 days during weeks 3 or 4 of treatment, or if you do not take this medicine  for at least 2 weeks after taking it for at least a month, do not start taking it again without talking with your  doctor. You will need to be watched again for at least 6 hours after the first dose. This medicine may stay in your body for up to 2 months after your last dose. Tell your doctor about any unusual symptoms. If you're a woman, do not get pregnant for at least 2 months after your last dose. If you do get pregnant, tell your doctor. Tell your doctor or health care professional right away if you have any change in your eyesight. Talk with your doctor if you have not had chickenpox or the vaccine for chickenpox. Your vision and blood may be tested before and during use of this medicine. What side effects may I notice from receiving this medicine? Side effects that you should report to your doctor or health care professional as soon as possible: -allergic reactions like skin rash, itching or hives, swelling of the face, lips, or tongue -breathing problems -changes in vision -chest pain or palpitations -dark urine -dizziness or fainting -feeling faint or lightheaded, falls -fever or chills, sore throat -general ill feeling or flu-like symptoms -light-colored stools -loss of appetite -nausea -right upper belly pain -unusually slow heartbeat -unusually weak or tired -yellowing of the eyes or skin  Side effects that usually do not require medical attention (Report these to your doctor or health care professional if they continue or are bothersome.): -back pain -cough -diarrhea -headache This list may not describe all possible side effects. Call your doctor for medical advice about side effects. You may report side effects to FDA at 1-800-FDA-1088. Where should I keep my medicine? Keep out of the reach of children. Store at room temperature between 15 and 30 degrees C (59 and 86 degrees F). Throw away any unused medicine after the expiration date. NOTE: This sheet is a summary. It may not cover all possible information. If you have questions about this medicine, talk to your doctor, pharmacist,  or health care provider.  2014, Elsevier/Gold Standard. (2011-10-23 17:25:50)

## 2013-05-06 NOTE — Progress Notes (Signed)
[name] Telephone:(336) [phone]   Fax:(336) [phone]  NEW PATIENT EVALUATION   Name: [name] Date: 05/09/2013 MRN: [id number] DOB: [birth year]  PCP: Lilian Coma, MD   REFERRING PHYSICIAN: Lilian Coma, MD  REASON FOR REFERRAL: Leukopenia    HISTORY OF PRESENT ILLNESS:[name] is a 73 y.o. female who has past medical history of Multiple sclerosis since 2000 well controlled on fingolimod HCL 0.5 mg daily is here for further evaluation of her leukopenia with co-existing neutrophilia.  She is accompanied by her husband [name] and her daughter [name].    She reports that her MS has been well controlled on fingolimid since starting nearly one year ago.  Prior to that she was on interferon.  Her CBC recently has been noticeable to mild leukopenia.  On 04/18/2013, her WBC was 3.2 with increased in Neutrophils of 82.2, decreased in lymphs of 5.3%, a normal Hgb of 12.8 and Plt of 315.   On 04/13/2013, her WBC was 3.3 with increased neutrophil % of 81.9, decreased lymph % of 5.2 and normal Hgb of 12.7 and Plt of 307.  On 04/05/2013, her WBC was normal at 7.2 with increased neutrophil % of 84.9, decreased lymphs of 3.8, with a normal Hgb of 12.4 and plts of 237.  Her CMP on 03/03/2013 was within normal limits.  An earlier CBC on 08/20/2012 demonstrated a wBC of 4.3, Hgb of 2.7, Plts of 294.  There was no differential on this date.  Vitamin B12 was normal range on 08/20/2012.  Finally, a CBC with differential collected when she was on interferon demonstrated a WBC of 9.2 with a mildly low lymph of 11; Hgb of 13.5;  With normal plts of 291.   She reports dry eyes, numbness in forearms, tingling in spine and R calf at times.   She does his dry cough (on lisinopril) occasionally.  She reports heartburn.  She has IBS with 3-4 loose stools/ day.   She has bouts of diverticulitis.  She denies recurrent infections or night sweats.  She denies fevers or chills.  She reports  having bronchitis in 2012.  She finished abx for a bout of diverticulitis treated with 10 days of cipro and metronidazole.  Otherwise, she denies any recent hospitalizations or emergency room visits.     PAST MEDICAL HISTORY:  has a past medical history of Multiple sclerosis; Gastritis; Duodenitis; Helicobacter pylori gastritis; Hyperlipidemia; Fatty liver; Diverticulosis; Hypertension; IBD (inflammatory bowel disease); Arthritis, degenerative; Memory loss; Colitis; Memory difficulty (01/05/2013); and Vitamin B12 deficiency.     PAST SURGICAL HISTORY: Past Surgical History  Procedure Laterality Date  . Cholecystectomy    . Hemorrhoid surgery    . Tubal ligation    . Spinal fusion    . Rotator cuff repair    . Abdominal hysterectomy  2010  . Bladder surgery  2010    Bladder Tack  . Cervical spine surgery       CURRENT MEDICATIONS: has a current medication list which includes the following prescription(s): b complex vitamins, benazepril, cetirizine, vitamin d-3, fenofibrate, fingolimod hcl, fluticasone, gabapentin, glucosamine, lovastatin, memantine, multivitamin, fish oil, probiotic product, vitamin b-12, and vitamin e.   ALLERGIES: Doxycycline; Erythromycin; Macrobid; Morphine; Sulfonamide derivatives; and Amoxicillin   SOCIAL HISTORY:  reports that she has quit smoking. She has never used smokeless tobacco. She reports that she drinks alcohol. She reports that she does not use illicit drugs.   FAMILY HISTORY: family history includes Breast cancer in her  cousin; Colon cancer in her maternal grandmother; Colon polyps in her brother and mother; Diabetes in her father; Heart disease in her father; Hypertension in her brother.   LABORATORY DATA:  CBC    Component Value Date/Time   WBC 3.7* 05/06/2013 0926   WBC 3.1* 01/05/2013 0844   WBC 7.7 11/27/2009 2235   RBC 4.19 05/06/2013 0926   RBC 4.29 01/05/2013 0844   RBC 3.82* 11/27/2009 2235   HGB 13.4 05/06/2013 0926   HGB 13.2 01/05/2013  0844   HCT 39.6 05/06/2013 0926   HCT 40.1 01/05/2013 0844   PLT 252 05/06/2013 0926   PLT 241 01/05/2013 0844   MCV 94.6 05/06/2013 0926   MCV 94 01/05/2013 0844   MCH 32.1 05/06/2013 0926   MCH 30.8 01/05/2013 0844   MCH 32.6 11/27/2009 2235   MCHC 33.9 05/06/2013 0926   MCHC 32.9 01/05/2013 0844   MCHC 34.9 11/27/2009 2235   RDW 13.3 05/06/2013 0926   RDW 13.8 01/05/2013 0844   RDW 12.3 11/27/2009 2235   LYMPHSABS 0.2* 05/06/2013 0926   LYMPHSABS 0.3* 01/05/2013 0844   LYMPHSABS 1.4 11/27/2009 2235   MONOABS 0.4 05/06/2013 0926   MONOABS 0.5 11/27/2009 2235   EOSABS 0.1 05/06/2013 0926   EOSABS 0.1 11/27/2009 2235   BASOSABS 0.0 05/06/2013 0926   BASOSABS 0.0 01/05/2013 0844   BASOSABS 0.1 11/27/2009 2235    CMP     Component Value Date/Time   NA 141 05/06/2013 0926   NA 141 01/05/2013 0844   NA 140 11/27/2009 2235   K 3.9 05/06/2013 0926   K 4.0 01/05/2013 0844   CL 103 01/05/2013 0844   CO2 26 05/06/2013 0926   CO2 29 01/05/2013 0844   GLUCOSE 119 05/06/2013 0926   GLUCOSE 100* 01/05/2013 0844   GLUCOSE 170* 11/27/2009 2235   BUN 21.6 05/06/2013 0926   BUN 16 01/05/2013 0844   BUN 19 11/27/2009 2235   CREATININE 0.8 05/06/2013 0926   CREATININE 0.65 01/05/2013 0844   CALCIUM 10.0 05/06/2013 0926   CALCIUM 10.0 01/05/2013 0844   PROT 7.7 05/06/2013 0926   PROT 7.3 01/05/2013 0844   PROT 7.3 12/11/2008 1010   ALBUMIN 4.1 05/06/2013 0926   ALBUMIN 3.7 12/11/2008 1010   AST 24 05/06/2013 0926   AST 28 01/05/2013 0844   ALT 16 05/06/2013 0926   ALT 16 01/05/2013 0844   ALKPHOS 49 05/06/2013 0926   ALKPHOS 62 01/05/2013 0844   BILITOT 0.58 05/06/2013 0926   BILITOT 0.4 01/05/2013 0844   GFRNONAA 90 01/05/2013 0844   GFRAA 103 01/05/2013 0844   Results for [name], [name] (MRN [id number]) as of 05/09/2013 04:57  Ref. Range 12/13/2008 16:00 12/14/2008 05:25 11/27/2009 22:35 01/05/2013 08:44 05/06/2013 09:26  WBC Latest Range: 3.4-10.8 x10E3/uL 5.9 6.8 7.7 3.1 (L) 3.7 (L)   Results for [name], [name] (MRN  [id number]) as of 05/09/2013 04:57  Ref. Range 12/11/2008 10:10 11/27/2009 22:35 01/05/2013 08:44 05/06/2013 09:26  Lymphocytes Absolute Latest Range: 0.7-3.1 x10E3/uL 1.3 1.4 0.3 (L)    Results for [name], [name] (MRN [id number]) as of 05/09/2013 04:57  Ref. Range 12/11/2008 10:10 11/27/2009 22:35 01/05/2013 08:44 05/06/2013 09:26  Neutrophils Relative % No range found 61 73 82   NEUT% Latest Range: 38.4-76.8 %    78.6 (H)   RADIOGRAPHY: No results found.     REVIEW OF SYSTEMS:  Constitutional: Denies fevers, chills or abnormal weight loss Eyes: Denies blurriness of vision Ears, nose, mouth, throat,  and face: Denies mucositis or sore throat Respiratory: Denies cough, dyspnea or wheezes Cardiovascular: Denies palpitation, chest discomfort or lower extremity swelling Gastrointestinal:  Denies nausea, heartburn or change in bowel habits Skin: Denies abnormal skin rashes Lymphatics: Denies new lymphadenopathy or easy bruising Neurological:Denies numbness, tingling or new weaknesses Behavioral/Psych: Mood is stable, no new changes  All other systems were reviewed with the patient and are negative.  PHYSICAL EXAM:  height is 5' 6.5" (1.689 m) and weight is 139 lb 14.4 oz (63.458 kg). Her oral temperature is 97.9 F (36.6 C). Her blood pressure is 131/69 and her pulse is 81. Her respiration is 18 and oxygen saturation is 97%.    GENERAL:alert, no distress and comfortable; well developed and well nourished.  SKIN: skin color, texture, turgor are normal, no rashes or significant lesions EYES: normal, Conjunctiva are pink and non-injected, sclera clear OROPHARYNX:no exudate, no erythema and lips, buccal mucosa, and tongue normal  NECK: supple, thyroid normal size, non-tender, without nodularity LYMPH:  no palpable lymphadenopathy in the cervical, axillary or inguinal LUNGS: clear to auscultation with normal breathing effort HEART: regular rate & rhythm and no murmurs and no lower extremity  edema ABDOMEN:abdomen soft, non-tender and normal bowel sounds Musculoskeletal:no cyanosis of digits and no clubbing  NEURO: alert & oriented x 3 with fluent speech, no focal motor/sensory deficits   IMPRESSION: [name] is a 73 y.o. female with a history of MS on an immunosuppressive therapy now with mild leukopenia with a neutrophilia.  She is asymptomatic.   PLAN: 1.  Leukopenia. --  Fingolimid is used for Treatment of relapsing forms of multiple sclerosis (MS) to reduce the frequency of clinical exacerbations and to delay the accumulation of physical disability. It can cause a Leukopenia and lymphocytopenia as one of its side effects.  In review of her WBC and differential, Her neutrophils were normal with a normal differential in 11/2008, 11/2009 but became decreased on labs collected on 01/05/2013.  She reports starting fingolimid one year ago.   This coincides with her leukopenia.  Therefore, I think her leukopenia is mostly like a drug-related effect.  A primary bone marrow problem is less likely.  She has normal hemoglobin and plts.   Her calcium and creatinine are all normal.   We will continue to monitor her counts every 3 months and patient has been advised to call our office if she has repeated infections.   We discussed that if counts decrease despite being off this medication and/or involve her hemoglobin and plts, a bone marrow biopsy may be warranted to exclude a primary bone marrow problem.  We will examine a peripheral smear.   2. MS. --Continue Fingolimid as her leukopenia is mild and her MS has been well controlled with this medications.  May require more frequent laboratory monitoring.   3. Follow-up. --Patient will have labs drawn at her PCP's office and faxed to our office in 3 months.  She will follow-up for an office visit and repeat labs in 6 months.   All questions were answered. The patient knows to call the clinic with any problems, questions or concerns. We can  certainly see the patient much sooner if necessary.  I spent 25 minutes counseling the patient face to face. The total time spent in the appointment was 45 minutes.    Betha Shadix, MD 05/09/2013 4:38 AM                   HPI

## 2013-05-11 ENCOUNTER — Encounter: Payer: Self-pay | Admitting: Internal Medicine

## 2013-05-12 NOTE — Progress Notes (Signed)
PATIENT CALLED TO MAKE SURE WE RECEIVED THE REFERRAL FROM DR Jonathon Jordan FOR HER VISIT WITH DR CHISM. WE DID RECEIVE IT FOR 4 VISITS.    05/12/13 LEFT MESSAGE PER HER INSTRUCTIONS ON [phone].

## 2013-05-17 ENCOUNTER — Encounter: Payer: Self-pay | Admitting: *Deleted

## 2013-05-25 ENCOUNTER — Telehealth: Payer: Self-pay

## 2013-05-25 NOTE — Telephone Encounter (Signed)
[employer] sent [place] a letter saying they have approved our request for coverage on Gilenya effective until 04/18/2015 Ref Member ID # [id number]

## 2013-07-05 ENCOUNTER — Ambulatory Visit (INDEPENDENT_AMBULATORY_CARE_PROVIDER_SITE_OTHER): Payer: Commercial Managed Care - HMO | Admitting: Nurse Practitioner

## 2013-07-05 ENCOUNTER — Encounter: Payer: Self-pay | Admitting: Nurse Practitioner

## 2013-07-05 VITALS — BP 120/68 | HR 78 | Ht 67.0 in | Wt 142.0 lb

## 2013-07-05 DIAGNOSIS — G35 Multiple sclerosis: Secondary | ICD-10-CM

## 2013-07-05 DIAGNOSIS — G35D Multiple sclerosis, unspecified: Secondary | ICD-10-CM

## 2013-07-05 DIAGNOSIS — R413 Other amnesia: Secondary | ICD-10-CM

## 2013-07-05 MED ORDER — MEMANTINE HCL ER 28 MG PO CP24: 28.0000 mg | ORAL_CAPSULE | Freq: Every day | ORAL | Status: DC

## 2013-07-05 NOTE — Patient Instructions (Signed)
Will switch to Namenda 77m daily voucher for free month given Will continue Gilenya reviewed labs F/U in 6 months

## 2013-07-05 NOTE — Progress Notes (Signed)
[facility]  PATIENT: [name] DOB: 1940-05-13   REASON FOR VISIT: Follow up  for multiple sclerosis and mild memory disturbance   HISTORY OF PRESENT ILLNESS: [name], 73 year old female returns for followup. She was last seen in this office by Dr. Jannifer Franklin 01/05/2013. She has a history of multiple sclerosis and is currently on gilenya tolerating without side effects. She also has mild memory difficulty and is currently on Namenda 10 mg twice daily. 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 No new interval medical issues. She returns for reevaluation. HISTORY: of multiple sclerosis and a history of a mild memory disturbance. The patient had been on an Exelon patch, but she developed problems with diarrhea. The patient has a history of colitis as well, and a history of a low vitamin B12 level. The patient felt that the Exelon patch did help her memory, and when she went off the medication, she has felt a decline in her ability to concentrate and a decline in her general memory. The patient has not noted any definite changes in her functional level with her multiple sclerosis. There have been no new problems with numbness, weakness, coordination, or with balance. The patient denies any new visual disturbances, and she has been followed by her ophthalmologist on the [place]. The patient is tolerating the Gilenya quite well. The patient reports some discomfort and irritation in the medial aspect of the right eye within the last week. The patient has also noted some decrease in hearing of the right ear. This has been noted since December 2013. The patient denies any ear pain. The patient returns for an evaluation.   REVIEW OF SYSTEMS: Full 14 system review of systems performed and notable only for those listed, all others are neg:  Constitutional: N/A  Cardiovascular: N/A  Ear/Nose/Throat:  Hearing loss  Skin: N/A  Eyes: Sensitivity to light  Respiratory: Shortness of breath Gastroitestinal: Urinary frequency Hematology/Lymphatic: N/A  Endocrine: N/A Musculoskeletal:N/A  Allergy/Immunology: N/A  Neurological: Memory loss, numbness Psychiatric: N/A   ALLERGIES: Allergies  Allergen Reactions  . Doxycycline     Raw mouth   . Erythromycin     REACTION: weakness  . Macrobid [Nitrofurantoin Macrocrystal] Hives  . Morphine   . Sulfonamide Derivatives Hives and Nausea And Vomiting  . Amoxicillin Hives    HOME MEDICATIONS: Outpatient Prescriptions Prior to Visit  Medication Sig Dispense Refill  . B Complex Vitamins (VITAMIN B COMPLEX PO) Take 1 tablet by mouth daily.        . benazepril (LOTENSIN) 20 MG tablet Take 10 mg by mouth daily.       . cetirizine (ZYRTEC) 10 MG tablet Take 10 mg by mouth daily.      . Cholecalciferol (VITAMIN D-3) 5000 UNITS TABS Take 5,000 Units by mouth daily.      . fenofibrate (TRICOR) 145 MG tablet Take 145 mg by mouth daily.      . Fingolimod HCl (GILENYA) 0.5 MG CAPS Take 1 capsule (0.5 mg total) by mouth daily.  30 capsule  6  . fluticasone (FLONASE) 50 MCG/ACT nasal spray Place 50 g into the nose.      . gabapentin (NEURONTIN) 600 MG tablet Take 1 tablet (600 mg total) by mouth 3 (three) times daily.  270 tablet  1  . Glucosamine 500 MG TABS Take 1 tablet by mouth 2 (two) times daily.       [name] Kitchen  lovastatin (MEVACOR) 40 MG tablet Take 40 mg by mouth 2 (two) times daily.        . Multiple Vitamin (MULTIVITAMIN) tablet Take 1 tablet by mouth daily.      . Omega-3 Fatty Acids (FISH OIL) 1200 MG CAPS Take 1 capsule by mouth daily.        . Probiotic Product (PROBIOTIC FORMULA PO) Take 1 tablet by mouth daily.        . vitamin B-12 (CYANOCOBALAMIN) 1000 MCG tablet Take 1,000 mcg by mouth daily.        . vitamin E 400 UNIT capsule Take 400 Units by mouth daily.      . memantine (NAMENDA) 10 MG tablet Take 1 tablet (10 mg total) by mouth 2  (two) times daily.  180 tablet  1   No facility-administered medications prior to visit.    PAST MEDICAL HISTORY: Past Medical History  Diagnosis Date  . Multiple sclerosis   . Gastritis   . Duodenitis   . Helicobacter pylori gastritis   . Hyperlipidemia   . Fatty liver   . Diverticulosis   . Hypertension   . IBD (inflammatory bowel disease)   . Arthritis, degenerative     both knees  . Memory loss     mild  . Colitis   . Memory difficulty 01/05/2013  . Vitamin B12 deficiency     PAST SURGICAL HISTORY: Past Surgical History  Procedure Laterality Date  . Cholecystectomy    . Hemorrhoid surgery    . Tubal ligation    . Spinal fusion    . Rotator cuff repair    . Abdominal hysterectomy  2010  . Bladder surgery  2010    Bladder Tack  . Cervical spine surgery      FAMILY HISTORY: Family History  Problem Relation Age of Onset  . Diabetes Father   . Heart disease Father   . Breast cancer Cousin   . Colon cancer Maternal Grandmother     ? may be duodenal cancer  . Colon polyps Mother   . Colon polyps Brother   . Hypertension Brother     SOCIAL HISTORY: History   Social History  . Marital Status: Married    Spouse Name: [name]     Number of Children: 4  . Years of Education: 12+   Occupational History  . Retired     Social History Main Topics  . Smoking status: Former Research scientist (life sciences)  . Smokeless tobacco: Never Used  . Alcohol Use: Yes     Comment: rare  . Drug Use: No  . Sexual Activity: Not on file   Other Topics Concern  . Not on file   Social History Narrative   Caffeine daily.    Patient lives at home with husband [name] but goes by [employer].    Patient has 4 children.    Patient has 1 year of college.    Patient is retired.    Patient is left handed.      PHYSICAL EXAM  Filed Vitals:   07/05/13 0822  BP: 120/68  Pulse: 78  Height: 5' 7"  (1.702 m)  Weight: 142 lb (64.411 kg)   Body mass index is 22.24 kg/(m^2).  Generalized: Well  developed, in no acute distress  Head: normocephalic and atraumatic,. Oropharynx benign  Neck: Supple, no carotid bruits  Cardiac: Regular rate rhythm, no murmur  Musculoskeletal: No deformity   Neurological examination   Mentation: Alert oriented to time, place, history taking. MMSE  29/30. AFT 20. Follows all commands speech and language fluent  Cranial nerve II-XII: Visual acuity 20/30 bilaterally .Pupils were equal round reactive to light extraocular movements were full, visual field were full on confrontational test. Facial sensation and strength were normal. hearing was intact to finger rubbing bilaterally. Uvula tongue midline. head turning and shoulder shrug were normal and symmetric.Tongue protrusion into cheek strength was normal. Motor: normal bulk and tone, full strength in the BUE, BLE, No focal weakness Coordination: finger-nose-finger, heel-to-shin bilaterally, no dysmetria Reflexes: Brachioradialis 2/2, biceps 2/2, triceps 2/2, patellar 2/2, Achilles 2/2, plantar responses were flexor bilaterally. Gait and Station: Rising up from seated position without assistance, normal stance,  moderate stride, good arm swing, smooth turning, able to perform tiptoe, and heel walking without difficulty. Tandem gait is mildly unsteady  DIAGNOSTIC DATA (LABS, IMAGING, TESTING) - I reviewed patient records, labs, notes, testing and imaging myself where available.  Lab Results  Component Value Date   WBC 3.7* 05/06/2013   HGB 13.4 05/06/2013   HCT 39.6 05/06/2013   MCV 94.6 05/06/2013   PLT 252 05/06/2013      Component Value Date/Time   NA 141 05/06/2013 0926   NA 141 01/05/2013 0844   NA 140 11/27/2009 2235   K 3.9 05/06/2013 0926   K 4.0 01/05/2013 0844   CL 103 01/05/2013 0844   CO2 26 05/06/2013 0926   CO2 29 01/05/2013 0844   GLUCOSE 119 05/06/2013 0926   GLUCOSE 100* 01/05/2013 0844   GLUCOSE 170* 11/27/2009 2235   BUN 21.6 05/06/2013 0926   BUN 16 01/05/2013 0844   BUN 19 11/27/2009 2235     CREATININE 0.8 05/06/2013 0926   CREATININE 0.65 01/05/2013 0844   CALCIUM 10.0 05/06/2013 0926   CALCIUM 10.0 01/05/2013 0844   PROT 7.7 05/06/2013 0926   PROT 7.3 01/05/2013 0844   PROT 7.3 12/11/2008 1010   ALBUMIN 4.1 05/06/2013 0926   ALBUMIN 3.7 12/11/2008 1010   AST 24 05/06/2013 0926   AST 28 01/05/2013 0844   ALT 16 05/06/2013 0926   ALT 16 01/05/2013 0844   ALKPHOS 49 05/06/2013 0926   ALKPHOS 62 01/05/2013 0844   BILITOT 0.58 05/06/2013 0926   BILITOT 0.4 01/05/2013 0844   GFRNONAA 90 01/05/2013 0844   GFRAA 103 01/05/2013 0844    ASSESSMENT AND PLAN  73 y.o. year old female  has a past medical history of Multiple sclerosis; Memory loss; and Vitamin B12 deficiency. here in followup for her memory loss and multiple sclerosis  Will switch to Namenda 61m daily voucher for free month given, MMSE 29/30 today Will continue Gilenya reviewed labs from 04/2013 F/U in 6 months NDennie Bible [facility] [id number] [place] [phone]

## 2013-07-05 NOTE — Progress Notes (Signed)
I have read the note, and I agree with the clinical assessment and plan.  [name],[name]   

## 2013-07-27 ENCOUNTER — Telehealth: Payer: Self-pay | Admitting: Internal Medicine

## 2013-07-27 NOTE — Telephone Encounter (Signed)
pt called to r/s appt due to going [place]...done...pt aware of new d.t

## 2013-07-29 ENCOUNTER — Emergency Department (HOSPITAL_COMMUNITY)
Admission: EM | Admit: 2013-07-29 | Discharge: 2013-07-29 | Disposition: A | Discharge status: Home / Self Care | Payer: Medicare HMO | Attending: Emergency Medicine | Admitting: Emergency Medicine

## 2013-07-29 ENCOUNTER — Encounter (HOSPITAL_COMMUNITY): Payer: Self-pay | Admitting: Emergency Medicine

## 2013-07-29 ENCOUNTER — Emergency Department (HOSPITAL_COMMUNITY): Payer: Medicare HMO

## 2013-07-29 ENCOUNTER — Telehealth: Payer: Self-pay

## 2013-07-29 DIAGNOSIS — K5732 Diverticulitis of large intestine without perforation or abscess without bleeding: Secondary | ICD-10-CM | POA: Insufficient documentation

## 2013-07-29 DIAGNOSIS — Z9089 Acquired absence of other organs: Secondary | ICD-10-CM | POA: Insufficient documentation

## 2013-07-29 DIAGNOSIS — Z880 Allergy status to penicillin: Secondary | ICD-10-CM | POA: Insufficient documentation

## 2013-07-29 DIAGNOSIS — K5792 Diverticulitis of intestine, part unspecified, without perforation or abscess without bleeding: Secondary | ICD-10-CM

## 2013-07-29 DIAGNOSIS — R000 Tachycardia, unspecified: Secondary | ICD-10-CM | POA: Insufficient documentation

## 2013-07-29 DIAGNOSIS — Z9851 Tubal ligation status: Secondary | ICD-10-CM | POA: Insufficient documentation

## 2013-07-29 DIAGNOSIS — IMO0002 Reserved for concepts with insufficient information to code with codable children: Secondary | ICD-10-CM | POA: Insufficient documentation

## 2013-07-29 DIAGNOSIS — M1990 Unspecified osteoarthritis, unspecified site: Secondary | ICD-10-CM | POA: Insufficient documentation

## 2013-07-29 DIAGNOSIS — M1710 Unilateral primary osteoarthritis, unspecified knee: Secondary | ICD-10-CM | POA: Insufficient documentation

## 2013-07-29 DIAGNOSIS — I10 Essential (primary) hypertension: Secondary | ICD-10-CM | POA: Insufficient documentation

## 2013-07-29 DIAGNOSIS — Z90710 Acquired absence of both cervix and uterus: Secondary | ICD-10-CM | POA: Insufficient documentation

## 2013-07-29 DIAGNOSIS — Z87891 Personal history of nicotine dependence: Secondary | ICD-10-CM | POA: Insufficient documentation

## 2013-07-29 DIAGNOSIS — Z79899 Other long term (current) drug therapy: Secondary | ICD-10-CM | POA: Insufficient documentation

## 2013-07-29 DIAGNOSIS — Z8669 Personal history of other diseases of the nervous system and sense organs: Secondary | ICD-10-CM | POA: Insufficient documentation

## 2013-07-29 DIAGNOSIS — E785 Hyperlipidemia, unspecified: Secondary | ICD-10-CM | POA: Insufficient documentation

## 2013-07-29 DIAGNOSIS — E538 Deficiency of other specified B group vitamins: Secondary | ICD-10-CM | POA: Insufficient documentation

## 2013-07-29 DIAGNOSIS — Z8619 Personal history of other infectious and parasitic diseases: Secondary | ICD-10-CM | POA: Insufficient documentation

## 2013-07-29 DIAGNOSIS — Z9889 Other specified postprocedural states: Secondary | ICD-10-CM | POA: Insufficient documentation

## 2013-07-29 LAB — URINALYSIS, ROUTINE W REFLEX MICROSCOPIC
Bilirubin Urine: NEGATIVE
GLUCOSE, UA: NEGATIVE mg/dL
HGB URINE DIPSTICK: NEGATIVE
Ketones, ur: NEGATIVE mg/dL
Leukocytes, UA: NEGATIVE
Nitrite: NEGATIVE
PROTEIN: 100 mg/dL — AB
SPECIFIC GRAVITY, URINE: 1.01 (ref 1.005–1.030)
Urobilinogen, UA: 0.2 mg/dL (ref 0.0–1.0)
pH: 6.5 (ref 5.0–8.0)

## 2013-07-29 LAB — CBC WITH DIFFERENTIAL/PLATELET
Basophils Absolute: 0 10*3/uL (ref 0.0–0.1)
Basophils Relative: 0 % (ref 0–1)
Eosinophils Absolute: 0.1 10*3/uL (ref 0.0–0.7)
Eosinophils Relative: 1 % (ref 0–5)
HCT: 39.4 % (ref 36.0–46.0)
Hemoglobin: 13.2 g/dL (ref 12.0–15.0)
Lymphocytes Relative: 4 % — ABNORMAL LOW (ref 12–46)
Lymphs Abs: 0.3 10*3/uL — ABNORMAL LOW (ref 0.7–4.0)
MCH: 31.4 pg (ref 26.0–34.0)
MCHC: 33.5 g/dL (ref 30.0–36.0)
MCV: 93.6 fL (ref 78.0–100.0)
Monocytes Absolute: 0.9 10*3/uL (ref 0.1–1.0)
Monocytes Relative: 10 % (ref 3–12)
NEUTROS ABS: 8 10*3/uL — AB (ref 1.7–7.7)
Neutrophils Relative %: 86 % — ABNORMAL HIGH (ref 43–77)
PLATELETS: 222 10*3/uL (ref 150–400)
RBC: 4.21 MIL/uL (ref 3.87–5.11)
RDW: 13.2 % (ref 11.5–15.5)
WBC: 9.4 10*3/uL (ref 4.0–10.5)

## 2013-07-29 LAB — COMPREHENSIVE METABOLIC PANEL
ALBUMIN: 3.8 g/dL (ref 3.5–5.2)
ALT: 15 U/L (ref 0–35)
AST: 25 U/L (ref 0–37)
Alkaline Phosphatase: 50 U/L (ref 39–117)
BUN: 33 mg/dL — ABNORMAL HIGH (ref 6–23)
CHLORIDE: 101 meq/L (ref 96–112)
CO2: 21 meq/L (ref 19–32)
Calcium: 9.7 mg/dL (ref 8.4–10.5)
Creatinine, Ser: 1.67 mg/dL — ABNORMAL HIGH (ref 0.50–1.10)
GFR calc Af Amer: 34 mL/min — ABNORMAL LOW (ref >90)
GFR, EST NON AFRICAN AMERICAN: 30 mL/min — AB (ref >90)
Glucose, Bld: 136 mg/dL — ABNORMAL HIGH (ref 70–99)
Potassium: 4.8 mEq/L (ref 3.7–5.3)
Sodium: 139 mEq/L (ref 137–147)
Total Bilirubin: 0.5 mg/dL (ref 0.3–1.2)
Total Protein: 7.4 g/dL (ref 6.0–8.3)

## 2013-07-29 LAB — URINE MICROSCOPIC-ADD ON

## 2013-07-29 MED ORDER — SODIUM CHLORIDE 0.9 % IV BOLUS (SEPSIS)
1000.0000 mL | Freq: Once | INTRAVENOUS | Status: AC
  Administered 2013-07-29: 1000 mL via INTRAVENOUS

## 2013-07-29 MED ORDER — IOHEXOL 300 MG/ML  SOLN
50.0000 mL | Freq: Once | INTRAMUSCULAR | Status: AC | PRN
  Administered 2013-07-29: 50 mL via ORAL

## 2013-07-29 NOTE — ED Notes (Signed)
Per pt, was diagnosed with diverticulitis by PCP yesterday and was given meds-was told to come to ED if increased temp and/or pain

## 2013-07-29 NOTE — Discharge Instructions (Signed)
Diverticulitis °A diverticulum is a small pouch or sac on the colon. Diverticulosis is the presence of these diverticula on the colon. Diverticulitis is the irritation (inflammation) or infection of diverticula. °CAUSES  °The colon and its diverticula contain bacteria. If food particles block the tiny opening to a diverticulum, the bacteria inside can grow and cause an increase in pressure. This leads to infection and inflammation and is called diverticulitis. °SYMPTOMS  °· Abdominal pain and tenderness. Usually, the pain is located on the left side of your abdomen. However, it could be located elsewhere. °· Fever. °· Bloating. °· Feeling sick to your stomach (nausea). °· Throwing up (vomiting). °· Abnormal stools. °DIAGNOSIS  °Your caregiver will take a history and perform a physical exam. Since many things can cause abdominal pain, other tests may be necessary. Tests may include: °· Blood tests. °· Urine tests. °· X-ray of the abdomen. °· CT scan of the abdomen. °Sometimes, surgery is needed to determine if diverticulitis or other conditions are causing your symptoms. °TREATMENT  °Most of the time, you can be treated without surgery. Treatment includes: °· Resting the bowels by only having liquids for a few days. As you improve, you will need to eat a low-fiber diet. °· Intravenous (IV) fluids if you are losing body fluids (dehydrated). °· Antibiotic medicines that treat infections may be given. °· Pain and nausea medicine, if needed. °· Surgery if the inflamed diverticulum has burst. °HOME CARE INSTRUCTIONS  °· Try a clear liquid diet (broth, tea, or water for as long as directed by your caregiver). You may then gradually begin a low-fiber diet as tolerated.  °A low-fiber diet is a diet with less than 10 grams of fiber. Choose the foods below to reduce fiber in the diet: °· White breads, cereals, rice, and pasta. °· Cooked fruits and vegetables or soft fresh fruits and vegetables without the skin. °· Ground or  well-cooked tender beef, ham, veal, lamb, pork, or poultry. °· Eggs and seafood. °· After your diverticulitis symptoms have improved, your caregiver may put you on a high-fiber diet. A high-fiber diet includes 14 grams of fiber for every 1000 calories consumed. For a standard 2000 calorie diet, you would need 28 grams of fiber. Follow these diet guidelines to help you increase the fiber in your diet. It is important to slowly increase the amount fiber in your diet to avoid gas, constipation, and bloating. °· Choose whole-grain breads, cereals, pasta, and brown rice. °· Choose fresh fruits and vegetables with the skin on. Do not overcook vegetables because the more vegetables are cooked, the more fiber is lost. °· Choose more nuts, seeds, legumes, dried peas, beans, and lentils. °· Look for food products that have greater than 3 grams of fiber per serving on the Nutrition Facts label. °· Take all medicine as directed by your caregiver. °· If your caregiver has given you a follow-up appointment, it is very important that you go. Not going could result in lasting (chronic) or permanent injury, pain, and disability. If there is any problem keeping the appointment, call to reschedule. °SEEK MEDICAL CARE IF:  °· Your pain does not improve. °· You have a hard time advancing your diet beyond clear liquids. °· Your bowel movements do not return to normal. °SEEK IMMEDIATE MEDICAL CARE IF:  °· Your pain becomes worse. °· You have an oral temperature above 102° F (38.9° C), not controlled by medicine. °· You have repeated vomiting. °· You have bloody or black, tarry stools. °·   Symptoms that brought you to your caregiver become worse or are not getting better. °MAKE SURE YOU:  °· Understand these instructions. °· Will watch your condition. °· Will get help right away if you are not doing well or get worse. °Document Released: 01/08/2005 Document Revised: 06/23/2011 Document Reviewed: 05/06/2010 °Exit[employer]® Patient Information  ©2014 [employer]. ° °

## 2013-07-29 NOTE — Telephone Encounter (Signed)
Answering pt question of earlier today. Dr Juliann Mule stated to not get CBC/diff until she finishes her antibiotics. She has active divurticulitis at present.

## 2013-07-29 NOTE — ED Provider Notes (Addendum)
CSN: [id number]     Arrival date & time 07/29/13  1652 History   First MD Initiated Contact with Patient 07/29/13 1726     Chief Complaint  Patient presents with  . left lower qaudrant pain      (Consider location/radiation/quality/duration/timing/severity/associated sxs/prior Treatment) HPI Comments: Pt seen by PCP yesterday and dx with diverticulitis and started on flagyl and cipro yesterday based on hx and exam  Patient is a 73 y.o. female presenting with abdominal pain. The history is provided by the patient.  Abdominal Pain Pain location:  LLQ Pain quality: cramping, gnawing and sharp   Pain radiates to:  Does not radiate Pain severity:  Moderate Onset quality:  Gradual Duration:  3 days Timing:  Constant Progression:  Worsening Chronicity:  Recurrent Context: eating   Relieved by:  Nothing Worsened by:  Eating Ineffective treatments:  None tried Associated symptoms: anorexia, fever and nausea   Associated symptoms: no cough, no diarrhea, no dysuria and no vomiting   Risk factors comment:  Hx of diverticulitis   Past Medical History  Diagnosis Date  . Multiple sclerosis   . Gastritis   . Duodenitis   . Helicobacter pylori gastritis   . Hyperlipidemia   . Fatty liver   . Diverticulosis   . Hypertension   . IBD (inflammatory bowel disease)   . Arthritis, degenerative     both knees  . Memory loss     mild  . Colitis   . Memory difficulty 01/05/2013  . Vitamin B12 deficiency    Past Surgical History  Procedure Laterality Date  . Cholecystectomy    . Hemorrhoid surgery    . Tubal ligation    . Spinal fusion    . Rotator cuff repair    . Abdominal hysterectomy  2010  . Bladder surgery  2010    Bladder Tack  . Cervical spine surgery     Family History  Problem Relation Age of Onset  . Diabetes Father   . Heart disease Father   . Breast cancer Cousin   . Colon cancer Maternal Grandmother     ? may be duodenal cancer  . Colon polyps Mother   . Colon  polyps Brother   . Hypertension Brother    History  Substance Use Topics  . Smoking status: Former Research scientist (life sciences)  . Smokeless tobacco: Never Used  . Alcohol Use: Yes     Comment: rare   OB History   Grav Para Term Preterm Abortions TAB SAB Ect Mult Living                 Review of Systems  Constitutional: Positive for fever.  Respiratory: Negative for cough.   Gastrointestinal: Positive for nausea, abdominal pain and anorexia. Negative for vomiting and diarrhea.  Genitourinary: Negative for dysuria.  All other systems reviewed and are negative.     Allergies  Vytorin; Doxycycline; Erythromycin; Macrobid; Morphine; Sulfonamide derivatives; and Amoxicillin  Home Medications   Prior to Admission medications   Medication Sig Start Date End Date Taking? Authorizing Provider  B Complex Vitamins (VITAMIN B COMPLEX PO) Take 1 tablet by mouth daily.      Historical Provider, MD  benazepril (LOTENSIN) 20 MG tablet Take 10 mg by mouth daily.     Historical Provider, MD  Calcium Carb-Cholecalciferol (CALCIUM 1000 + D PO) Take by mouth daily.    Historical Provider, MD  cetirizine (ZYRTEC) 10 MG tablet Take 10 mg by mouth daily.    Historical Provider,  MD  Cholecalciferol (VITAMIN D-3) 5000 UNITS TABS Take 5,000 Units by mouth daily.    Historical Provider, MD  ciprofloxacin (CIPRO) 500 MG tablet  07/28/13   Historical Provider, MD  desonide (DESOWEN) 0.05 % lotion Apply 1 application topically daily.    Historical Provider, MD  fenofibrate (TRICOR) 145 MG tablet Take 145 mg by mouth daily.    Historical Provider, MD  Fingolimod HCl (GILENYA) 0.5 MG CAPS Take 1 capsule (0.5 mg total) by mouth daily. 04/18/13   Kathrynn Ducking, MD  fluticasone (FLONASE) 50 MCG/ACT nasal spray Place 50 g into the nose. 12/24/12   Historical Provider, MD  gabapentin (NEURONTIN) 600 MG tablet Take 1 tablet (600 mg total) by mouth 3 (three) times daily. 04/19/13   Kathrynn Ducking, MD  Glucosamine 500 MG TABS Take 1  tablet by mouth 2 (two) times daily.     Historical Provider, MD  lovastatin (MEVACOR) 40 MG tablet Take 40 mg by mouth 2 (two) times daily.      Historical Provider, MD  Memantine HCl ER (NAMENDA XR) 28 MG CP24 Take 28 mg by mouth daily. 07/05/13   Dennie Bible, NP  metroNIDAZOLE (FLAGYL) 500 MG tablet  07/28/13   Historical Provider, MD  Multiple Vitamin (MULTIVITAMIN) tablet Take 1 tablet by mouth daily.    Historical Provider, MD  Omega-3 Fatty Acids (FISH OIL) 1200 MG CAPS Take 1 capsule by mouth daily.      Historical Provider, MD  Probiotic Product (PROBIOTIC FORMULA PO) Take 1 tablet by mouth daily.      Historical Provider, MD  vitamin B-12 (CYANOCOBALAMIN) 1000 MCG tablet Take 1,000 mcg by mouth daily.      Historical Provider, MD  vitamin E 400 UNIT capsule Take 400 Units by mouth daily.    Historical Provider, MD   BP 134/73  Pulse 118  Temp(Src) 103.1 F (39.5 C) (Oral)  Resp 16  SpO2 96% Physical Exam  Nursing note and vitals reviewed. Constitutional: She is oriented to person, place, and time. She appears well-developed and well-nourished. No distress.  HENT:  Head: Normocephalic and atraumatic.  Mouth/Throat: Oropharynx is clear and moist.  Eyes: Conjunctivae and EOM are normal. Pupils are equal, round, and reactive to light.  Neck: Normal range of motion. Neck supple.  Cardiovascular: Regular rhythm and intact distal pulses.  Tachycardia present.   No murmur heard. Pulmonary/Chest: Effort normal and breath sounds normal. No respiratory distress. She has no wheezes. She has no rales.  Abdominal: Soft. She exhibits no distension. There is tenderness in the left lower quadrant. There is no rebound and no guarding.  Musculoskeletal: Normal range of motion. She exhibits no edema and no tenderness.  Neurological: She is alert and oriented to person, place, and time.  Skin: Skin is warm and dry. No rash noted. No erythema.  Psychiatric: She has a normal mood and affect.  Her behavior is normal.    ED Course  Procedures (including critical care time) Labs Review Labs Reviewed  CBC WITH DIFFERENTIAL - Abnormal; Notable for the following:    Neutrophils Relative % 86 (*)    Neutro Abs 8.0 (*)    Lymphocytes Relative 4 (*)    Lymphs Abs 0.3 (*)    All other components within normal limits  COMPREHENSIVE METABOLIC PANEL - Abnormal; Notable for the following:    Glucose, Bld 136 (*)    BUN 33 (*)    Creatinine, Ser 1.67 (*)    GFR calc  non Af Amer 30 (*)    GFR calc Af Amer 34 (*)    All other components within normal limits  URINALYSIS, ROUTINE W REFLEX MICROSCOPIC - Abnormal; Notable for the following:    Protein, ur 100 (*)    All other components within normal limits  URINE MICROSCOPIC-ADD ON - Abnormal; Notable for the following:    Casts HYALINE CASTS (*)    All other components within normal limits    Imaging Review Ct Abdomen Pelvis Wo Contrast  07/29/2013   CLINICAL DATA:  Diverticulitis.  Fever and increasing pain.  EXAM: CT ABDOMEN AND PELVIS WITHOUT CONTRAST  TECHNIQUE: Multidetector CT imaging of the abdomen and pelvis was performed following the standard protocol without intravenous contrast.  COMPARISON:  CT ABD/PELVIS W CM dated 11/28/2009  FINDINGS: Lung bases show no acute findings. Heart size normal. No pericardial or pleural effusion.  Liver margin may be minimally irregular. Cholecystectomy. Adrenal glands are unremarkable. Low-attenuation lesion in the right kidney measures 12 mm, stable and likely a cyst. Punctate calcifications in the kidneys bilaterally. Spleen, pancreas and stomach are unremarkable. Duodenal diverticulum is incidentally noted. Thickening of the distal and terminal ileum appear similar to 11/28/2009. Inflammatory stranding and haziness are seen around the distal descending and proximal sigmoid colon. No extraluminal air or fluid collection.  Hysterectomy. Ovaries are visualized. Atherosclerotic calcification of the  arterial vasculature without abdominal aortic aneurysm. Retroperitoneal lymph nodes measure up to 12 mm in the periaortic region, unchanged. No free fluid. Degenerative changes are seen in the spine.  IMPRESSION: 1. Diverticulitis involving the distal descending and proximal sigmoid colon. No complicating features. 2. Liver margin may be minimally irregular, suggestive of early/mild cirrhosis. 3. Punctate bilateral renal stones.   Electronically Signed   By: Lorin Picket M.D.   On: 07/29/2013 20:10     EKG Interpretation None      MDM   Final diagnoses:  Diverticulitis    Patient diagnosed with diverticulitis he yesterday by her PCP based on history and exam. She was started on Cipro and Flagyl however last night developed a fever with persistent fever today up to 103. She called her physician and they recommended she come in for further evaluation. Patient has a history of diverticulitis and today has left lower quadrant pain without guarding or rebound.  She denies any vomiting but decreased appetite and mild nausea. Denies any bloody stools. Positive bowel sounds without evidence or concern for bowel obstruction. CT ordered to rule out complicated diverticulitis. Patient has taken 2 doses of antibiotics at this point. White cell count yesterday was 7.8 no other labs available.  CBC, CMP, UA, CT abdomen pelvis with contrast pending. Patient given IV fluids.  8:25 PM CT showed uncomplicated diverticulitis and will d/c home to continue abx.  Blanchie Dessert, MD 07/29/13 8250  Blanchie Dessert, MD 07/29/13 2040

## 2013-08-22 ENCOUNTER — Telehealth: Payer: Self-pay | Admitting: Nurse Practitioner

## 2013-08-22 MED ORDER — MEMANTINE HCL ER 28 MG PO CP24: 28.0000 mg | ORAL_CAPSULE | Freq: Every day | ORAL | Status: DC

## 2013-08-22 NOTE — Telephone Encounter (Signed)
Patient finished Namenda tablets and now wants Rx for Namenda 28 in capsules called to [facility]--thank you.

## 2013-08-22 NOTE — Telephone Encounter (Signed)
Rx has been sent  

## 2013-09-02 ENCOUNTER — Other Ambulatory Visit: Payer: Self-pay

## 2013-09-02 MED ORDER — GABAPENTIN 600 MG PO TABS: 600.0000 mg | ORAL_TABLET | Freq: Three times a day (TID) | ORAL | Status: DC

## 2013-10-30 ENCOUNTER — Other Ambulatory Visit: Payer: Self-pay

## 2013-10-30 MED ORDER — FINGOLIMOD HCL 0.5 MG PO CAPS: 0.5000 mg | ORAL_CAPSULE | Freq: Every day | ORAL | Status: DC

## 2013-11-03 ENCOUNTER — Ambulatory Visit (HOSPITAL_BASED_OUTPATIENT_CLINIC_OR_DEPARTMENT_OTHER): Payer: Commercial Managed Care - HMO

## 2013-11-03 ENCOUNTER — Encounter: Payer: Self-pay | Admitting: Internal Medicine

## 2013-11-03 ENCOUNTER — Ambulatory Visit (HOSPITAL_BASED_OUTPATIENT_CLINIC_OR_DEPARTMENT_OTHER): Payer: Medicare HMO | Admitting: Internal Medicine

## 2013-11-03 ENCOUNTER — Telehealth: Payer: Self-pay | Admitting: Internal Medicine

## 2013-11-03 VITALS — BP 150/76 | HR 80 | Temp 98.2°F | Resp 18 | Ht 67.0 in | Wt 137.2 lb

## 2013-11-03 DIAGNOSIS — D72819 Decreased white blood cell count, unspecified: Secondary | ICD-10-CM

## 2013-11-03 DIAGNOSIS — G35 Multiple sclerosis: Secondary | ICD-10-CM

## 2013-11-03 LAB — CBC WITH DIFFERENTIAL/PLATELET
BASO%: 0.2 % (ref 0.0–2.0)
BASOS ABS: 0 10*3/uL (ref 0.0–0.1)
EOS ABS: 0.2 10*3/uL (ref 0.0–0.5)
EOS%: 3.3 % (ref 0.0–7.0)
HCT: 38.1 % (ref 34.8–46.6)
HEMOGLOBIN: 12.2 g/dL (ref 11.6–15.9)
LYMPH#: 0.3 10*3/uL — AB (ref 0.9–3.3)
LYMPH%: 6.4 % — ABNORMAL LOW (ref 14.0–49.7)
MCH: 31 pg (ref 25.1–34.0)
MCHC: 32 g/dL (ref 31.5–36.0)
MCV: 96.7 fL (ref 79.5–101.0)
MONO#: 0.4 10*3/uL (ref 0.1–0.9)
MONO%: 9.1 % (ref 0.0–14.0)
NEUT%: 81 % — ABNORMAL HIGH (ref 38.4–76.8)
NEUTROS ABS: 3.7 10*3/uL (ref 1.5–6.5)
Platelets: 243 10*3/uL (ref 145–400)
RBC: 3.94 10*6/uL (ref 3.70–5.45)
RDW: 14 % (ref 11.2–14.5)
WBC: 4.5 10*3/uL (ref 3.9–10.3)

## 2013-11-03 LAB — CHCC SMEAR

## 2013-11-03 NOTE — Patient Instructions (Addendum)
Neutropenia Neutropenia is a condition that occurs when the level of a certain type of white blood cell (neutrophil) in your body becomes lower than normal. Neutrophils are made in the bone marrow and fight infections. These cells protect against bacteria and viruses. The fewer neutrophils you have, and the longer your body remains without them, the greater your risk of getting a severe infection becomes. CAUSES  The cause of neutropenia may be hard to determine. However, it is usually due to 3 main problems:   Decreased production of neutrophils. This may be due to:  Certain medicines such as chemotherapy.  Genetic problems.  Cancer.  Radiation treatments.  Vitamin deficiency.  Some pesticides.  Increased destruction of neutrophils. This may be due to:  Overwhelming infections.  Hemolytic anemia. This is when the body destroys its own blood cells.  Chemotherapy.  Neutrophils moving to areas of the body where they cannot fight infections. This may be due to:  Dialysis procedures.  Conditions where the spleen becomes enlarged. Neutrophils are held in the spleen and are not available to the rest of the body.  Overwhelming infections. The neutrophils are held in the area of the infection and are not available to the rest of the body. SYMPTOMS  There are no specific symptoms of neutropenia. The lack of neutrophils can result in an infection, and an infection can cause various problems. DIAGNOSIS  Diagnosis is made by a blood test. A complete blood count is performed. The normal level of neutrophils in human blood differs with age and race. Infants have lower counts than older children and adults. African Americans have lower counts than Caucasians or Asians. The average adult level is 1500 cells/mm3 of blood. Neutrophil counts are interpreted as follows:  Greater than 1000 cells/mm3 gives normal protection against infection.  500 to 1000 cells/mm3 gives an increased risk for  infection.  200 to 500 cells/mm3 is a greater risk for severe infection.  Lower than 200 cells/mm3 is a marked risk of infection. This may require hospitalization and treatment with antibiotic medicines. TREATMENT  Treatment depends on the underlying cause, severity, and presence of infections or symptoms. It also depends on your health. Your caregiver will discuss the treatment plan with you. Mild cases are often easily treated and have a good outcome. Preventative measures may also be started to limit your risk of infections. Treatment can include:  Taking antibiotics.  Stopping medicines that are known to cause neutropenia.  Correcting nutritional deficiencies by eating green vegetables to supply folic acid and taking vitamin B supplements.  Stopping exposure to pesticides if your neutropenia is related to pesticide exposure.  Taking a blood growth factor called sargramostim, pegfilgrastim, or filgrastim if you are undergoing chemotherapy for cancer. This stimulates white blood cell production.  Removal of the spleen if you have Felty's syndrome and have repeated infections. HOME CARE INSTRUCTIONS   Follow your caregiver's instructions about when you need to have blood work done.  Wash your hands often. Make sure others who come in contact with you also wash their hands.  Wash raw fruits and vegetables before eating them. They can carry bacteria and fungi.  Avoid people with colds or spreadable (contagious) diseases (chickenpox, herpes zoster, influenza).  Avoid large crowds.  Avoid construction areas. The dust can release fungus into the air.  Be cautious around children in daycare or school environments.  Take care of your respiratory system by coughing and deep breathing.  Bathe daily.  Protect your skin from cuts and  burns.  Do not work in the garden or with flowers and plants.  Care for the mouth before and after meals by brushing with a soft toothbrush. If you have  mucositis, do not use mouthwash. Mouthwash contains alcohol and can dry out the mouth even more.  Clean the area between the genitals and the anus (perineal area) after urination and bowel movements. Women need to wipe from front to back.  Use a water soluble lubricant during sexual intercourse and practice good hygiene after. Do not have intercourse if you are severely neutropenic. Check with your caregiver for guidelines.  Exercise daily as tolerated.  Avoid people who were vaccinated with a live vaccine in the past 30 days. You should not receive live vaccines (polio, typhoid).  Do not provide direct care for pets. Avoid animal droppings. Do not clean litter boxes and bird cages.  Do not share food utensils.  Do not use tampons, enemas, or rectal suppositories unless directed by your caregiver.  Use an electric razor to remove hair.  Wash your hands after handling magazines, letters, and newspapers. SEEK IMMEDIATE MEDICAL CARE IF:   You have a fever.  You have chills or start to shake.  You feel nauseous or vomit.  You develop mouth sores.  You develop aches and pains.  You have redness and swelling around open wounds.  Your skin is warm to the touch.  You have pus coming from your wounds.  You develop swollen lymph nodes.  You feel weak or fatigued.  You develop red streaks on the skin. MAKE SURE YOU:  Understand these instructions.  Will watch your condition.  Will get help right away if you are not doing well or get worse. Document Released: 09/20/2001 Document Revised: 06/23/2011 Document Reviewed: 10/18/2010 [facility] Patient Information 2015 [place], [place]. This information is not intended to replace advice given to you by your health care provider. Make sure you discuss any questions you have with your health care provider.   Nosebleed Nosebleeds can be caused by many conditions, including trauma, infections, polyps, foreign bodies, dry mucous membranes  or climate, medicines, and air conditioning. Most nosebleeds occur in the front of the nose. Because of this location, most nosebleeds can be controlled by pinching the nostrils gently and continuously for at least 10 to 20 minutes. The long, continuous pressure allows enough time for the blood to clot. If pressure is released during that 10 to 20 minute time period, the process may have to be started again. The nosebleed may stop by itself or quit with pressure, or it may need concentrated heating (cautery) or pressure from packing. HOME CARE INSTRUCTIONS   If your nose was packed, try to maintain the pack inside until your health care provider removes it. If a gauze pack was used and it starts to fall out, gently replace it or cut the end off. Do not cut if a balloon catheter was used to pack the nose. Otherwise, do not remove unless instructed.  Avoid blowing your nose for 12 hours after treatment. This could dislodge the pack or clot and start the bleeding again.  If the bleeding starts again, sit up and bend forward, gently pinching the front half of your nose continuously for 20 minutes.  If bleeding was caused by dry mucous membranes, use over-the-counter saline nasal spray or gel. This will keep the mucous membranes moist and allow them to heal. If you must use a lubricant, choose the water-soluble variety. Use it only sparingly and  not within several hours of lying down.  Do not use petroleum jelly or mineral oil, as these may drip into the lungs and cause serious problems.  Maintain humidity in your home by using less air conditioning or by using a humidifier.  Do not use aspirin or medicines which make bleeding more likely. Your health care provider can give you recommendations on this.  Resume normal activities as you are able, but try to avoid straining, lifting, or bending at the waist for several days.  If the nosebleeds become recurrent and the cause is unknown, your health care  provider may suggest laboratory tests. SEEK MEDICAL CARE IF: You have a fever. SEEK IMMEDIATE MEDICAL CARE IF:   Bleeding recurs and cannot be controlled.  There is unusual bleeding from or bruising on other parts of the body.  Nosebleeds continue.  There is any worsening of the condition which originally brought you in.  You become light-headed, feel faint, become sweaty, or vomit blood. MAKE SURE YOU:   Understand these instructions.  Will watch your condition.  Will get help right away if you are not doing well or get worse. Document Released: 01/08/2005 Document Revised: 08/15/2013 Document Reviewed: 03/01/2009 [facility] Patient Information 2015 [place], [place]. This information is not intended to replace advice given to you by your health care provider. Make sure you discuss any questions you have with your health care provider.

## 2013-11-03 NOTE — Progress Notes (Signed)
[facility] OFFICE PROGRESS NOTE  Vicki Coma, MD [address] [place] [address]  DIAGNOSIS: Leukopenia - Plan: CBC with Differential, Basic metabolic panel (Bmet) - [facility], CBC with Differential, Basic metabolic panel (Bmet) - [facility]  Chief Complaint  Patient presents with  . Leukopenia    CURRENT TREATMENT: Observation.  INTERVAL HISTORY: [name] 73 y.o. female who has past medical history of Multiple sclerosis since 2000 well controlled on fingolimod HCL 0.5 mg daily is here for follow up of her leukopenia with co-existing neutrophilia. She was initially seen by me on 05/06/2013.   Today, she is alone and states she is doing well overall.  She does endorse a second bout of diverticulitis on 07/29/2013.  It involved the distal descending and proximal sigmoid colon without complicating features. It has resolved.  She does take flonase daily and states intermittent nose bleeds all lasting less than 5 mins. She denies any recent hospitalizations or emergency room visits.  She went to a 10-day trip to [place] without incident.   She reported dry eyes, numbness in forearms, tingling in spine and R calf at times. She does his dry cough (on lisinopril) occasionally. She reports heartburn. She has IBS with 3-4 loose stools/ day.  She denies recurrent infections or night sweats. She denies fevers or chills.   Leukopenia history: Previously, she reports that her MS has been well controlled on fingolimid since starting nearly one year ago. Prior to that she was on interferon. Her CBC had been noticeable to mild leukopenia. On 04/18/2013, her WBC was 3.2 with increased in Neutrophils of 82.2, decreased in lymphs of 5.3%, a normal Hgb of 12.8 and Plt of 315. On 04/13/2013, her WBC was 3.3 with increased neutrophil % of 81.9, decreased lymph % of 5.2 and normal Hgb of 12.7 and Plt of 307. On 04/05/2013, her WBC was normal at 7.2 with increased neutrophil % of  84.9, decreased lymphs of 3.8, with a normal Hgb of 12.4 and plts of 237. Her CMP on 03/03/2013 was within normal limits. An earlier CBC on 08/20/2012 demonstrated a wBC of 4.3, Hgb of 2.7, Plts of 294. There was no differential on this date. Vitamin B12 was normal range on 08/20/2012. Finally, a CBC with differential collected when she was on interferon demonstrated a WBC of 9.2 with a mildly low lymph of 11; Hgb of 13.5; With normal plts of 291.  MEDICAL HISTORY: Past Medical History  Diagnosis Date  . Multiple sclerosis   . Gastritis   . Duodenitis   . Helicobacter pylori gastritis   . Hyperlipidemia   . Fatty liver   . Diverticulosis   . Hypertension   . IBD (inflammatory bowel disease)   . Arthritis, degenerative     both knees  . Memory loss     mild  . Colitis   . Memory difficulty 01/05/2013  . Vitamin B12 deficiency     INTERIM HISTORY: has HYPERLIPIDEMIA; MULTIPLE SCLEROSIS; INFLAMMATORY BOWEL DISEASE; DIVERTICULOSIS, COLON; FATTY LIVER DISEASE; OTHER SYMPTOMS INVOLVING HEAD AND NECK; PERSONAL HISTORY OF PEPTIC ULCER DISEASE; Memory difficulty; and Leukopenia on her problem list.    ALLERGIES:  is allergic to vytorin; doxycycline; erythromycin; macrobid; morphine; sulfonamide derivatives; and amoxicillin.  MEDICATIONS: has a current medication list which includes the following prescription(s): b complex vitamins, calcium carb-cholecalciferol, carboxymethylcellulose, cetirizine, vitamin d-3, desonide, evening primrose oil, fenofibrate, fingolimod hcl, fluticasone, gabapentin, glucosamine, lovastatin, memantine hcl er, multivitamin, fish oil, probiotic product, vitamin b-12,  and vitamin e.  SURGICAL HISTORY:  Past Surgical History  Procedure Laterality Date  . Cholecystectomy    . Hemorrhoid surgery    . Tubal ligation    . Spinal fusion    . Rotator cuff repair    . Abdominal hysterectomy  2010  . Bladder surgery  2010    Bladder Tack  . Cervical spine surgery       REVIEW OF SYSTEMS:   Constitutional: Denies fevers, chills or abnormal weight loss Eyes: Denies blurriness of vision Ears, nose, mouth, throat, and face: Denies mucositis or sore throat Respiratory: Denies cough, dyspnea or wheezes Cardiovascular: Denies palpitation, chest discomfort or lower extremity swelling Gastrointestinal:  Denies nausea, heartburn or change in bowel habits Skin: Denies abnormal skin rashes Lymphatics: Denies new lymphadenopathy or easy bruising Neurological:Denies numbness, tingling or new weaknesses Behavioral/Psych: Mood is stable, no new changes  All other systems were reviewed with the patient and are negative.  PHYSICAL EXAMINATION: ECOG PERFORMANCE STATUS: 0 - Asymptomatic  Blood pressure 150/76, pulse 80, temperature 98.2 F (36.8 C), temperature source Oral, resp. rate 18, height 5' 7" (1.702 m), weight 137 lb 3.2 oz (62.234 kg), SpO2 97.00%.  GENERAL:alert, no distress and comfortable; well developed and well nourished.  SKIN: skin color, texture, turgor are normal, no rashes or significant lesions EYES: normal, Conjunctiva are pink and non-injected, sclera clear OROPHARYNX:no exudate, no erythema and lips, buccal mucosa, and tongue normal  NECK: supple, thyroid normal size, non-tender, without nodularity LYMPH:  no palpable lymphadenopathy in the cervical, axillary or supraclavicular LUNGS: clear to auscultation with normal breathing effort, no wheezes or rhonchi HEART: regular rate & rhythm and no murmurs and no lower extremity edema ABDOMEN:abdomen soft, non-tender and normal bowel sounds Musculoskeletal:no cyanosis of digits and no clubbing  NEURO: alert & oriented x 3 with fluent speech, no focal motor/sensory deficits  Labs:  Lab Results  Component Value Date   WBC 4.5 11/03/2013   HGB 12.2 11/03/2013   HCT 38.1 11/03/2013   MCV 96.7 11/03/2013   PLT 243 11/03/2013   NEUTROABS 3.7 11/03/2013      Chemistry      Component Value  Date/Time   NA 139 07/29/2013 1740   NA 141 05/06/2013 0926   NA 141 01/05/2013 0844   K 4.8 07/29/2013 1740   K 3.9 05/06/2013 0926   CL 101 07/29/2013 1740   CO2 21 07/29/2013 1740   CO2 26 05/06/2013 0926   BUN 33* 07/29/2013 1740   BUN 21.6 05/06/2013 0926   BUN 16 01/05/2013 0844   CREATININE 1.67* 07/29/2013 1740   CREATININE 0.8 05/06/2013 0926      Component Value Date/Time   CALCIUM 9.7 07/29/2013 1740   CALCIUM 10.0 05/06/2013 0926   ALKPHOS 50 07/29/2013 1740   ALKPHOS 49 05/06/2013 0926   AST 25 07/29/2013 1740   AST 24 05/06/2013 0926   ALT 15 07/29/2013 1740   ALT 16 05/06/2013 0926   BILITOT 0.5 07/29/2013 1740   BILITOT 0.58 05/06/2013 0926     Studies:  No results found.   RADIOGRAPHIC STUDIES: No results found.  IMPRESSION: [name] is a 73 y.o. female with a history of MS on an immunosuppressive therapy now with mild leukopenia with a neutrophilia, now resolved. She is asymptomatic.   PLAN:  1. Leukopenia, resolved.  --She will require labs today.  Her WBC is 4.5. She denies any recurrent infections.  -- Fingolimid is used for Treatment of relapsing forms  of multiple sclerosis (MS) to reduce the frequency of clinical exacerbations and to delay the accumulation of physical disability. It can cause a Leukopenia and lymphocytopenia as one of its side effects. In review of her WBC and differential, Her neutrophils were normal with a normal differential in 11/2008, 11/2009 but became decreased on labs collected on 01/05/2013. She reports starting fingolimid one year ago. This coincides with her leukopenia. Therefore, I think her leukopenia is mostly like a drug-related effect.   --A primary bone marrow problem is less likely. She has normal hemoglobin and plts. We will continue to monitor her counts every 3 months and patient has been advised to call our office if she has repeated infections. We discussed that if counts decrease despite being off this medication and/or involve  her hemoglobin and plts, a bone marrow biopsy may be warranted to exclude a primary bone marrow problem.   2. MS, stable.  --Continue Fingolimid as her leukopenia is mild and her MS has been well controlled with this medications. May require more frequent laboratory monitoring.   3. Follow-up.  --Patient will have labs drawn at her PCP's office and faxed to our office in 3 months. She will follow-up for an office visit and repeat labs in 6 months.   All questions were answered. The patient knows to call the clinic with any problems, questions or concerns. We can certainly see the patient much sooner if necessary.  I spent 10 minutes counseling the patient face to face. The total time spent in the appointment was 15 minutes.    , , MD 11/03/2013 10:33 AM    HPI

## 2013-11-03 NOTE — Telephone Encounter (Signed)
gv and printed appt sched and avs for pt for July and Jan 2016....sent to lab

## 2013-11-04 ENCOUNTER — Ambulatory Visit: Payer: Medicare HMO

## 2013-12-27 ENCOUNTER — Other Ambulatory Visit: Payer: Self-pay

## 2013-12-27 MED ORDER — MEMANTINE HCL ER 28 MG PO CP24: 28.0000 mg | ORAL_CAPSULE | Freq: Every day | ORAL | Status: DC

## 2014-01-05 ENCOUNTER — Ambulatory Visit (INDEPENDENT_AMBULATORY_CARE_PROVIDER_SITE_OTHER): Payer: Commercial Managed Care - HMO | Admitting: Nurse Practitioner

## 2014-01-05 ENCOUNTER — Encounter: Payer: Self-pay | Admitting: Nurse Practitioner

## 2014-01-05 VITALS — BP 142/80 | HR 75 | Ht 67.0 in | Wt 142.0 lb

## 2014-01-05 DIAGNOSIS — R413 Other amnesia: Secondary | ICD-10-CM

## 2014-01-05 DIAGNOSIS — Z5181 Encounter for therapeutic drug level monitoring: Secondary | ICD-10-CM

## 2014-01-05 DIAGNOSIS — G35 Multiple sclerosis: Secondary | ICD-10-CM

## 2014-01-05 LAB — COMPREHENSIVE METABOLIC PANEL
ALBUMIN: 4.6 g/dL (ref 3.5–4.8)
ALK PHOS: 59 IU/L (ref 39–117)
ALT: 16 IU/L (ref 0–32)
AST: 28 IU/L (ref 0–40)
Albumin/Globulin Ratio: 1.6 (ref 1.1–2.5)
BILIRUBIN TOTAL: 0.3 mg/dL (ref 0.0–1.2)
BUN / CREAT RATIO: 24 (ref 11–26)
BUN: 18 mg/dL (ref 8–27)
CO2: 31 mmol/L — ABNORMAL HIGH (ref 18–29)
CREATININE: 0.75 mg/dL (ref 0.57–1.00)
Calcium: 10 mg/dL (ref 8.7–10.3)
Chloride: 102 mmol/L (ref 96–108)
GFR calc non Af Amer: 80 mL/min/{1.73_m2} (ref >59)
GFR, EST AFRICAN AMERICAN: 92 mL/min/{1.73_m2} (ref >59)
Globulin, Total: 2.8 g/dL (ref 1.5–4.5)
Glucose: 82 mg/dL (ref 65–99)
Potassium: 4.1 mmol/L (ref 3.5–5.2)
Sodium: 141 mmol/L (ref 134–144)
Total Protein: 7.4 g/dL (ref 6.0–8.5)

## 2014-01-05 LAB — CBC WITH DIFFERENTIAL
BASOS: 0 %
Basophils Absolute: 0 10*3/uL (ref 0.0–0.2)
EOS ABS: 0.1 10*3/uL (ref 0.0–0.4)
EOS: 3 %
HCT: 40.1 % (ref 34.0–46.6)
HEMOGLOBIN: 13.3 g/dL (ref 11.1–15.9)
LYMPHS: 15 %
Lymphocytes Absolute: 0.5 10*3/uL — ABNORMAL LOW (ref 0.7–3.1)
MCH: 30.9 pg (ref 26.6–33.0)
MCHC: 33.2 g/dL (ref 31.5–35.7)
MCV: 93 fL (ref 79–97)
Monocytes Absolute: 0.4 10*3/uL (ref 0.1–0.9)
Monocytes: 11 %
NEUTROS ABS: 2.4 10*3/uL (ref 1.4–7.0)
Neutrophils Relative %: 60 %
Platelets: 245 10*3/uL (ref 150–379)
RBC: 4.31 x10E6/uL (ref 3.77–5.28)
RDW: 13.5 % (ref 12.3–15.4)
WBC: 3.4 10*3/uL (ref 3.4–10.8)

## 2014-01-05 LAB — IMMATURE CELLS: Bands(Auto) Relative: 11 %

## 2014-01-05 NOTE — Progress Notes (Signed)
[facility]  PATIENT: [name] DOB: [birth year]   REASON FOR VISIT: for MS and memory loss   HISTORY OF PRESENT ILLNESS: [name], 73 year old female returns for followup. She was last seen in this office 07/05/13.  She has a history of multiple sclerosis and is currently on gilenya tolerating without side effects.There has been no new problems with numbness, weakness, coordination, or with balance. No falls, exercising several times per week. The patient denies any new visual disturbances, She also has mild memory difficulty and is currently on Namenda 29m daily. 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 No new interval medical issues. She returns for reevaluation.   HISTORY: of multiple sclerosis and a history of a mild memory disturbance. The patient had been on an Exelon patch, but she developed problems with diarrhea. The patient has a history of colitis as well, and a history of a low vitamin B12 level. The patient felt that the Exelon patch did help her memory, and when she went off the medication, she has felt a decline in her ability to concentrate and a decline in her general memory. The patient has not noted any definite changes in her functional level with her multiple sclerosis. There have been no new problems with numbness, weakness, coordination, or with balance. The patient denies any new visual disturbances, and she has been followed by her ophthalmologist on the [place] The patient is tolerating the Gilenya quite well. The patient reports some discomfort and irritation in the medial aspect of the right eye within the last week. The patient has also noted some decrease in hearing of the right ear. This has been noted since December 2013. The patient denies any ear pain. The patient returns for an evaluation.  REVIEW OF SYSTEMS: Full 14 system review of systems performed  and notable only for those listed, all others are neg:  Constitutional: N/A  Cardiovascular: N/A  Ear/Nose/Throat: N/A  Skin: N/A  Eyes: Blurred vision  Respiratory: N/A  Gastroitestinal: N/A  Hematology/Lymphatic: N/A  Endocrine: Intolerance to heat  Musculoskeletal: Joint pain, neck stiffness Allergy/Immunology: Environmental allergies Neurological: Memory loss Psychiatric: N/A Sleep : NA   ALLERGIES: Allergies  Allergen Reactions  . Vytorin [Ezetimibe-Simvastatin]     White skin patch  . Doxycycline     Raw mouth   . Erythromycin     REACTION: weakness  . Macrobid [Nitrofurantoin Macrocrystal] Hives  . Morphine   . Sulfonamide Derivatives Hives and Nausea And Vomiting  . Amoxicillin Hives    HOME MEDICATIONS: Outpatient Prescriptions Prior to Visit  Medication Sig Dispense Refill  . B Complex Vitamins (VITAMIN B COMPLEX PO) Take 1 tablet by mouth daily.       . Calcium Carb-Cholecalciferol (CALCIUM 1000 + D PO) Take by mouth daily.      . carboxymethylcellulose 1 % ophthalmic solution Apply 1 drop to eye daily.      . cetirizine (ZYRTEC) 10 MG tablet Take 10 mg by mouth as needed.       . Cholecalciferol (VITAMIN D-3) 5000 UNITS TABS Take 5,000 Units by mouth daily.      .[name] Kitchendesonide (DESOWEN) 0.05 % lotion Apply 1 application topically daily.      . Evening Primrose Oil 500 MG CAPS Take 1 capsule by mouth daily.      . fenofibrate (TRICOR) 145 MG tablet Take 145 mg by mouth daily.      .[name] Kitchen  Fingolimod HCl (GILENYA) 0.5 MG CAPS Take 1 capsule (0.5 mg total) by mouth daily.  30 capsule  6  . fluticasone (FLONASE) 50 MCG/ACT nasal spray Place 1 spray into the nose as needed.       . gabapentin (NEURONTIN) 600 MG tablet Take 1 tablet (600 mg total) by mouth 3 (three) times daily.  270 tablet  1  . Glucosamine 500 MG TABS Take 1 tablet by mouth 2 (two) times daily.       [name] Kitchen lovastatin (MEVACOR) 40 MG tablet Take 40 mg by mouth 2 (two) times daily.        . Memantine HCl ER  (NAMENDA XR) 28 MG CP24 Take 28 mg by mouth daily.  90 capsule  0  . Multiple Vitamin (MULTIVITAMIN) tablet Take 0.5 tablets by mouth daily.       . Omega-3 Fatty Acids (FISH OIL) 1200 MG CAPS Take 1 capsule by mouth daily.       . Probiotic Product (PROBIOTIC FORMULA PO) Take 1 tablet by mouth daily.        . vitamin B-12 (CYANOCOBALAMIN) 1000 MCG tablet Take 1,000 mcg by mouth daily.        . vitamin E 400 UNIT capsule Take 400 Units by mouth daily.       No facility-administered medications prior to visit.    PAST MEDICAL HISTORY: Past Medical History  Diagnosis Date  . Multiple sclerosis   . Gastritis   . Duodenitis   . Helicobacter pylori gastritis   . Hyperlipidemia   . Fatty liver   . Diverticulosis   . Hypertension   . IBD (inflammatory bowel disease)   . Arthritis, degenerative     both knees  . Memory loss     mild  . Colitis   . Memory difficulty 01/05/2013  . Vitamin B12 deficiency   . Osteopenia     PAST SURGICAL HISTORY: Past Surgical History  Procedure Laterality Date  . Cholecystectomy    . Hemorrhoid surgery    . Tubal ligation    . Spinal fusion    . Rotator cuff repair    . Abdominal hysterectomy  2010  . Bladder surgery  2010    Bladder Tack  . Cervical spine surgery      FAMILY HISTORY: Family History  Problem Relation Age of Onset  . Diabetes Father   . Heart disease Father   . Breast cancer Cousin   . Colon cancer Maternal Grandmother     ? may be duodenal cancer  . Colon polyps Mother   . Colon polyps Brother   . Hypertension Brother     SOCIAL HISTORY: History   Social History  . Marital Status: Married    Spouse Name: Eduard Clos     Number of Children: 4  . Years of Education: 12+   Occupational History  . Retired     Social History Main Topics  . Smoking status: Former Research scientist (life sciences)  . Smokeless tobacco: Never Used  . Alcohol Use: Yes     Comment: rare  . Drug Use: No  . Sexual Activity: Not on file   Other Topics Concern    . Not on file   Social History Narrative   Caffeine daily.    Patient lives at home with husband Eduard Clos but goes by [employer].    Patient has 4 children.    Patient has 1 year of college.    Patient is retired.    Patient is  left handed.      PHYSICAL EXAM  Filed Vitals:   01/05/14 0822  BP: 142/80  Pulse: 75  Height: 5' 7"  (1.702 m)  Weight: 142 lb (64.411 kg)   Body mass index is 22.24 kg/(m^2). Generalized: Well developed, in no acute distress  Head: normocephalic and atraumatic,. Oropharynx benign  Neck: Supple, no carotid bruits  Cardiac: Regular rate rhythm, no murmur  Musculoskeletal: No deformity  Neurological examination  Mentation: Alert oriented to time, place, history taking. MMSE 30/30. AFT 20. Follows all commands speech and language fluent  Cranial nerve II-XII: Visual acuity 20/50 with glasses.Pupils were equal round reactive to light extraocular movements were full, visual field were full on confrontational test. Facial sensation and strength were normal. hearing was intact to finger rubbing bilaterally. Uvula tongue midline. head turning and shoulder shrug were normal and symmetric.Tongue protrusion into cheek strength was normal.  Motor: normal bulk and tone, full strength in the BUE, BLE, No focal weakness  Coordination: finger-nose-finger, heel-to-shin bilaterally, no dysmetria  Sensory: Intact to pinprick, soft touch, and vibratory in upper and lower extremities Reflexes: Brachioradialis 2/2, biceps 2/2, triceps 2/2, patellar 2/2, Achilles 2/2, plantar responses were flexor bilaterally.  Gait and Station: Rising up from seated position without assistance, normal stance, moderate stride, good arm swing, smooth turning, able to perform tiptoe, and heel walking without difficulty. Tandem gait is mildly unsteady  DIAGNOSTIC DATA (LABS, IMAGING, TESTING) - I reviewed patient records, labs, notes, testing and imaging myself where available.  Lab Results  Component  Value Date   WBC 4.5 11/03/2013   HGB 12.2 11/03/2013   HCT 38.1 11/03/2013   MCV 96.7 11/03/2013   PLT 243 11/03/2013      Component Value Date/Time   NA 139 07/29/2013 1740   NA 141 05/06/2013 0926   NA 141 01/05/2013 0844   K 4.8 07/29/2013 1740   K 3.9 05/06/2013 0926   CL 101 07/29/2013 1740   CO2 21 07/29/2013 1740   CO2 26 05/06/2013 0926   GLUCOSE 136* 07/29/2013 1740   GLUCOSE 119 05/06/2013 0926   GLUCOSE 100* 01/05/2013 0844   BUN 33* 07/29/2013 1740   BUN 21.6 05/06/2013 0926   BUN 16 01/05/2013 0844   CREATININE 1.67* 07/29/2013 1740   CREATININE 0.8 05/06/2013 0926   CALCIUM 9.7 07/29/2013 1740   CALCIUM 10.0 05/06/2013 0926   PROT 7.4 07/29/2013 1740   PROT 7.7 05/06/2013 0926   PROT 7.3 01/05/2013 0844   ALBUMIN 3.8 07/29/2013 1740   ALBUMIN 4.1 05/06/2013 0926   AST 25 07/29/2013 1740   AST 24 05/06/2013 0926   ALT 15 07/29/2013 1740   ALT 16 05/06/2013 0926   ALKPHOS 50 07/29/2013 1740   ALKPHOS 49 05/06/2013 0926   BILITOT 0.5 07/29/2013 1740   BILITOT 0.58 05/06/2013 0926   GFRNONAA 30* 07/29/2013 1740   GFRAA 34* 07/29/2013 1740    ASSESSMENT AND PLAN  73 y.o. year old female  has a past medical history of Multiple sclerosis;  Fatty liver; Diverticulosis; Hypertension; IBD (inflammatory bowel disease); Arthritis, degenerative; Memory loss; Colitis;  Vitamin B12 deficiency; here  to followup  Continue Namenda at current dose, memory score is stable just refilled  Continue gilenya, Check labs today, CBC CMP Followup in 6 months next visit with Dr. Jerline Pain, [facility], [facility], [facility] [address] [place], [place] [address] [phone]

## 2014-01-05 NOTE — Patient Instructions (Signed)
Continue Namenda at current dose, memory score is stable Continue gilenya, Check labs today Followup in 6 months next visit with Dr. Jannifer Franklin

## 2014-01-06 ENCOUNTER — Telehealth: Payer: Self-pay | Admitting: Nurse Practitioner

## 2014-01-06 NOTE — Telephone Encounter (Signed)
Patient returning Carolyn's call. She will be available at this number until late this afternoon. Best number is  [phone].

## 2014-01-06 NOTE — Telephone Encounter (Signed)
Discussed with Dr. Janann Colonel, [place] repeat in 3 months. TC to patient and made aware of labs.

## 2014-01-06 NOTE — Telephone Encounter (Signed)
Patient calling to state that she has a family emergency and can be reached at her cell phone on [phone] or a detailed message can be left on her home number.

## 2014-01-06 NOTE — Telephone Encounter (Signed)
Attempted to call all 3 numbers. Left message I will call back.

## 2014-01-06 NOTE — Progress Notes (Signed)
I have read the note, and I agree with the clinical assessment and plan.  WILLIS,CHARLES KEITH   

## 2014-02-01 ENCOUNTER — Telehealth: Payer: Self-pay | Admitting: Nurse Practitioner

## 2014-02-01 NOTE — Telephone Encounter (Signed)
Spoke to patient and she is aware that she needs to come in to have labs drawn and she does not need an appointment scheduled for just labs.

## 2014-02-01 NOTE — Telephone Encounter (Signed)
Patient calling to check whether she still needs to come in and have blood work done in 3 months, states she couldn't remember exactly what Hoyle Sauer had said, please return call and advise.

## 2014-03-29 ENCOUNTER — Other Ambulatory Visit: Payer: Self-pay | Admitting: Dermatology

## 2014-04-03 ENCOUNTER — Telehealth: Payer: Self-pay | Admitting: Nurse Practitioner

## 2014-04-03 ENCOUNTER — Other Ambulatory Visit (INDEPENDENT_AMBULATORY_CARE_PROVIDER_SITE_OTHER): Payer: Self-pay

## 2014-04-03 DIAGNOSIS — Z5181 Encounter for therapeutic drug level monitoring: Secondary | ICD-10-CM

## 2014-04-03 DIAGNOSIS — Z0289 Encounter for other administrative examinations: Secondary | ICD-10-CM

## 2014-04-03 DIAGNOSIS — R413 Other amnesia: Secondary | ICD-10-CM

## 2014-04-03 NOTE — Telephone Encounter (Signed)
Patient here for 3 month CBC follow up.

## 2014-04-04 ENCOUNTER — Encounter: Payer: Self-pay | Admitting: *Deleted

## 2014-04-04 LAB — CBC WITH DIFFERENTIAL/PLATELET
Basophils Absolute: 0 10*3/uL (ref 0.0–0.2)
Basos: 1 %
EOS ABS: 0.2 10*3/uL (ref 0.0–0.4)
EOS: 5 %
HEMATOCRIT: 39.4 % (ref 34.0–46.6)
HEMOGLOBIN: 12.8 g/dL (ref 11.1–15.9)
IMMATURE GRANS (ABS): 0 10*3/uL (ref 0.0–0.1)
Immature Granulocytes: 0 %
Lymphocytes Absolute: 0.3 10*3/uL — ABNORMAL LOW (ref 0.7–3.1)
Lymphs: 8 %
MCH: 30 pg (ref 26.6–33.0)
MCHC: 32.5 g/dL (ref 31.5–35.7)
MCV: 93 fL (ref 79–97)
MONOS ABS: 0.4 10*3/uL (ref 0.1–0.9)
Monocytes: 10 %
NEUTROS ABS: 2.7 10*3/uL (ref 1.4–7.0)
NEUTROS PCT: 76 %
RBC: 4.26 x10E6/uL (ref 3.77–5.28)
RDW: 14.2 % (ref 12.3–15.4)
WBC: 3.5 10*3/uL (ref 3.4–10.8)

## 2014-04-12 ENCOUNTER — Telehealth: Payer: Self-pay | Admitting: Oncology

## 2014-04-12 NOTE — Telephone Encounter (Signed)
, °

## 2014-04-17 ENCOUNTER — Telehealth: Payer: Self-pay | Admitting: Neurology

## 2014-04-21 ENCOUNTER — Telehealth: Payer: Self-pay | Admitting: *Deleted

## 2014-04-21 NOTE — Telephone Encounter (Signed)
Ins has been contacted.  The request is currently under review.  They will notify patient of outcome once a decision has been made.  I called back.  Got no answer.  Left message.

## 2014-04-21 NOTE — Telephone Encounter (Signed)
Patient stated prior authorization is needed for Rx Fingolimod HCl (GILENYA) 0.5 MG CAPS.  Please forward to [facility], new ID # [place], group # [id number].  Please call and advise.

## 2014-04-21 NOTE — Telephone Encounter (Signed)
-----   Message from Dennie Bible, NP sent at 04/04/2014 10:12 AM EST ----- Labs ok will recheck next visit

## 2014-04-21 NOTE — Telephone Encounter (Signed)
LVM of normal labs and to call back with any questions or concerns.

## 2014-04-21 NOTE — Telephone Encounter (Signed)
Patient calling back and spoke with medical records regarding faxed copy of new insurance card.  Medical records informed patient that fax would have went to Pharmacy.  Patient checking status.  Please call and advise.

## 2014-04-24 ENCOUNTER — Telehealth: Payer: Self-pay | Admitting: Neurology

## 2014-04-24 ENCOUNTER — Telehealth: Payer: Self-pay

## 2014-04-24 MED ORDER — FINGOLIMOD HCL 0.5 MG PO CAPS: 0.5000 mg | ORAL_CAPSULE | Freq: Every day | ORAL | Status: DC

## 2014-04-24 NOTE — Telephone Encounter (Signed)
Holland Falling has approved the request for coverage on Gilenya effective until 04/14/2015 Ref # [id number]

## 2014-04-24 NOTE — Telephone Encounter (Signed)
The patient has changed to [name].  [employer] was trying to fill an old Rx they had on file.

## 2014-04-24 NOTE — Telephone Encounter (Signed)
Aisha with [facility] is calling to advise the Rx for Gilenya cannot be filled because the patients insurance termed 04-13-14. Thank you.

## 2014-04-25 ENCOUNTER — Other Ambulatory Visit: Payer: Self-pay

## 2014-04-25 MED ORDER — GABAPENTIN 600 MG PO TABS: 600.0000 mg | ORAL_TABLET | Freq: Three times a day (TID) | ORAL | Status: DC

## 2014-04-25 MED ORDER — MEMANTINE HCL ER 28 MG PO CP24: 28.0000 mg | ORAL_CAPSULE | Freq: Every day | ORAL | Status: DC

## 2014-04-26 ENCOUNTER — Other Ambulatory Visit: Payer: Self-pay

## 2014-04-26 MED ORDER — FINGOLIMOD HCL 0.5 MG PO CAPS: 0.5000 mg | ORAL_CAPSULE | Freq: Every day | ORAL | Status: DC

## 2014-04-26 NOTE — Telephone Encounter (Signed)
April, Pharmacist with [facility] # [phone], stated patient wants Rx refill for Fingolimod HCl (GILENYA) 0.5 MG CAPS forwarded to [facility].  Please call and advise

## 2014-04-26 NOTE — Telephone Encounter (Signed)
I called and spoke with April.  She feels confident they will be able to get this drug from their wholesaler even though it is a specialty med.  I will send Rx, and they will cal [place] back if they are not able to get this drug.

## 2014-05-02 ENCOUNTER — Encounter: Payer: Self-pay | Admitting: Internal Medicine

## 2014-05-04 ENCOUNTER — Ambulatory Visit: Payer: Medicare PPO

## 2014-05-04 ENCOUNTER — Other Ambulatory Visit: Payer: Medicare PPO

## 2014-05-15 ENCOUNTER — Other Ambulatory Visit: Payer: Self-pay | Admitting: *Deleted

## 2014-05-15 DIAGNOSIS — D72819 Decreased white blood cell count, unspecified: Secondary | ICD-10-CM

## 2014-05-16 ENCOUNTER — Other Ambulatory Visit (HOSPITAL_BASED_OUTPATIENT_CLINIC_OR_DEPARTMENT_OTHER): Payer: Medicare HMO

## 2014-05-16 ENCOUNTER — Ambulatory Visit (HOSPITAL_BASED_OUTPATIENT_CLINIC_OR_DEPARTMENT_OTHER): Payer: Medicare HMO | Admitting: Oncology

## 2014-05-16 VITALS — BP 146/74 | HR 73 | Temp 98.4°F | Resp 18 | Ht 67.0 in | Wt 140.9 lb

## 2014-05-16 DIAGNOSIS — D72810 Lymphocytopenia: Secondary | ICD-10-CM

## 2014-05-16 DIAGNOSIS — D72819 Decreased white blood cell count, unspecified: Secondary | ICD-10-CM

## 2014-05-16 LAB — CBC WITH DIFFERENTIAL/PLATELET
BASO%: 0.5 % (ref 0.0–2.0)
Basophils Absolute: 0 10*3/uL (ref 0.0–0.1)
EOS%: 4.7 % (ref 0.0–7.0)
Eosinophils Absolute: 0.2 10*3/uL (ref 0.0–0.5)
HEMATOCRIT: 39.9 % (ref 34.8–46.6)
HEMOGLOBIN: 12.7 g/dL (ref 11.6–15.9)
LYMPH#: 0.2 10*3/uL — AB (ref 0.9–3.3)
LYMPH%: 6.5 % — AB (ref 14.0–49.7)
MCH: 30.1 pg (ref 25.1–34.0)
MCHC: 31.9 g/dL (ref 31.5–36.0)
MCV: 94.3 fL (ref 79.5–101.0)
MONO#: 0.4 10*3/uL (ref 0.1–0.9)
MONO%: 10.4 % (ref 0.0–14.0)
NEUT#: 2.9 10*3/uL (ref 1.5–6.5)
NEUT%: 77.9 % — AB (ref 38.4–76.8)
Platelets: 218 10*3/uL (ref 145–400)
RBC: 4.23 10*6/uL (ref 3.70–5.45)
RDW: 13.5 % (ref 11.2–14.5)
WBC: 3.7 10*3/uL — ABNORMAL LOW (ref 3.9–10.3)

## 2014-05-16 NOTE — Progress Notes (Signed)
  [facility] OFFICE PROGRESS NOTE   Diagnosis: Lymphopenia  INTERVAL HISTORY:   [name] was evaluated by Dr. Juliann Mule for lymphopenia. She began Gilenya in early 2014. She reports this has helped the him as symptoms. Ms. Mchale feels well. No recent infection aside from an episode of diverticulitis. She has a history of inflammatory bowel disease, but is not on treatment. She denies recent steroid therapy and radiation exposure. She has a hot flash when she misses a dose of gabapentin. She denies risk factors for HIV infection.  Objective:  Vital signs in last 24 hours:  Blood pressure 146/74, pulse 73, temperature 98.4 F (36.9 C), temperature source Oral, resp. rate 18, height 5' 7"  (1.702 m), weight 140 lb 14.4 oz (63.912 kg).    HEENT: Neck without mass Lymphatics: No cervical, supra-clavicular, or inguinal nodes. Soft mobile 1/2-1 cm bilateral axillary nodes versus prominent fat pads. Resp: Lungs clear bilaterally Cardio: Regular rate and rhythm GI: No hepatosplenomegaly, nontender, no mass Vascular: No leg edema   Lab Results:  Lab Results  Component Value Date   WBC 3.7* 05/16/2014   HGB 12.7 05/16/2014   HCT 39.9 05/16/2014   MCV 94.3 05/16/2014   PLT 218 05/16/2014   NEUTROABS 2.9 05/16/2014   absolute lymphocyte count 0.2   Medications: I have reviewed the patient's current medications.  Assessment/Plan:  She has persistent lymphopenia and is stable from a hematologic standpoint. I suspect the lymphopenia is related to fingolimod or less likely gabapentin. I have a low clinical suspicion for another systemic process causing the lymphopenia.  Disposition:  She plans to continue clinical follow-up with Drs. Willis and [facility]. I will defer a decision on discontinuing the fingolimod to Dr. Jannifer Franklin.  She is not scheduled for a follow-up appointment in the hematology clinic. I am available to see her in the future as needed.  Betsy Coder,  MD  05/16/2014  11:14 AM

## 2014-06-01 ENCOUNTER — Ambulatory Visit (AMBULATORY_SURGERY_CENTER): Payer: Medicare HMO

## 2014-06-01 VITALS — Ht 65.5 in | Wt 142.6 lb

## 2014-06-01 DIAGNOSIS — Z860100 Personal history of colon polyps, unspecified: Secondary | ICD-10-CM

## 2014-06-01 MED ORDER — MOVIPREP 100 G PO SOLR: 1.0000 | Freq: Once | ORAL | Status: DC

## 2014-06-01 NOTE — Progress Notes (Signed)
No allergies to eggs or soy (GI intolerance to eggs/takes flu shot without difficulties) No home oxygen No past problems with anesthesia (PONV with general anesthesia) No diet/weight loss meds  Has email  Emmi instructions given for colonoscopy

## 2014-06-08 ENCOUNTER — Encounter: Payer: Self-pay | Admitting: Internal Medicine

## 2014-06-14 ENCOUNTER — Encounter: Payer: Self-pay | Admitting: Internal Medicine

## 2014-06-14 ENCOUNTER — Encounter: Payer: Self-pay | Admitting: *Deleted

## 2014-06-14 ENCOUNTER — Ambulatory Visit (AMBULATORY_SURGERY_CENTER): Payer: Medicare HMO | Admitting: Internal Medicine

## 2014-06-14 VITALS — BP 142/67 | HR 64 | Temp 96.9°F | Resp 16 | Ht 65.5 in | Wt 142.0 lb

## 2014-06-14 DIAGNOSIS — Z86010 Personal history of colonic polyps: Secondary | ICD-10-CM

## 2014-06-14 DIAGNOSIS — K5010 Crohn's disease of large intestine without complications: Secondary | ICD-10-CM

## 2014-06-14 DIAGNOSIS — K529 Noninfective gastroenteritis and colitis, unspecified: Secondary | ICD-10-CM

## 2014-06-14 MED ORDER — SODIUM CHLORIDE 0.9 % IV SOLN: 500.0000 mL | INTRAVENOUS | Status: DC

## 2014-06-14 NOTE — Progress Notes (Signed)
Called to room to assist during endoscopic procedure.  Patient ID and intended procedure confirmed with present staff. Received instructions for my participation in the procedure from the performing physician.  

## 2014-06-14 NOTE — Op Note (Signed)
[place]  [address] [place], [address]   COLONOSCOPY PROCEDURE REPORT  PATIENT: [name], [name]  MR#: [id number] BIRTHDATE: 03/08/1941 , 73  yrs. old GENDER: female ENDOSCOPIST: Lafayette Dragon, MD REFERRED BW:LSLHTD Stephanie Acre, M.D. PROCEDURE DATE:  06/14/2014 PROCEDURE:   Colonoscopy with biopsy First Screening Colonoscopy - Avg.  risk and is 50 yrs.  old or older - No.  Prior Negative Screening - Now for repeat screening. N/A  History of Adenoma - Now for follow-up colonoscopy & has been > or = to 3 yrs.  Yes hx of adenoma.  Has been 3 or more years since last colonoscopy.  Polyps Removed Today? No.  Polyps Removed Today? No.  Recommend repeat exam, <10 yrs? Polyps Removed Today? No.  Recommend repeat exam, <10 yrs? No. ASA CLASS:   Class II INDICATIONS:history of adenomatous polyp in 2006.  Last colonoscopy in February 2009 showed inflammatory polyp and a cecal ulcer. Biopsies confirmed focal colitis.  Patient has positive IBD markers for Crohn's disease.  She has chronic diarrhea..  She discontinued all medications. MEDICATIONS: Monitored anesthesia care and Propofol 250 mg IV  DESCRIPTION OF PROCEDURE:   After the risks benefits and alternatives of the procedure were thoroughly explained, informed consent was obtained.  The digital rectal exam revealed no abnormalities of the rectum.   The [id number]  endoscope was introduced through the anus and advanced to the terminal ileum which was intubated for a short distance. No adverse events experienced.   The quality of the prep was good, using MoviPrep The instrument was then slowly withdrawn as the colon was fully examined.      COLON FINDINGS: Mucosa throughout the colon was normal.  There was moderately severe diverticulosis of the sigmoid and descending colon with focal narrowing hypertrophy and thickened folds. Ileocecal valve was erythematous edematous and friable.  Multiple biopsies  were obtained area terminal ileum was entered and showed mild erythema of the distal ileum.  Biopsies were obtained to rule out ileitis.  Colonoscopy was then slowly retracted and random biopsies were obtained from the right transverse and left colon to rule out microscopic colitis.  Retroflexed views revealed no abnormalities. The time to cecum=8 minutes 09 seconds.  Withdrawal time=13 minutes 19 seconds.  The scope was withdrawn and the procedure completed. COMPLICATIONS: There were no immediate complications.  ENDOSCOPIC IMPRESSION: Mucosa throughout the colon was normal.  There was moderately severe diverticulosis of the sigmoid and descending colon with focal narrowing hypertrophy and thickened folds.  Ileocecal valve was erythematous edematous and friable.  Multiple biopsies were obtained area terminal ileum was entered and showed mild erythema of the distal ileum.  Biopsies were obtained to rule out ileitis. Colonoscopy was then slowly retracted and random biopsies were obtained from the right transverse and left colon to rule out microscopic colitis in summary there is evidence of focal colitis involving ileocecal valve. Multiple biopsies obtained from terminal ileum and randomly through the colon as well as from the ileocecal valve  RECOMMENDATIONS: 1.  Await pathology results 2.  Depending on the biopsy results of patient may reinitiate treatment for inflammatory bowel disease.  He was that the prior medication did not help.  We will see her in the office to discuss options for treatment,  eSigned:  Lafayette Dragon, MD 06/14/2014 9:05 AM   cc:   PATIENT NAME:  Shavonte, Zhao MR#: [id number]

## 2014-06-14 NOTE — Patient Instructions (Signed)
YOU HAD AN ENDOSCOPIC PROCEDURE TODAY AT [facility]:   Refer to the procedure report that was given to you for any specific questions about what was found during the examination.  If the procedure report does not answer your questions, please call your gastroenterologist to clarify.  If you requested that your care partner not be given the details of your procedure findings, then the procedure report has been included in a sealed envelope for you to review at your convenience later.  YOU SHOULD EXPECT: Some feelings of bloating in the abdomen. Passage of more gas than usual.  Walking can help get rid of the air that was put into your GI tract during the procedure and reduce the bloating. If you had a lower endoscopy (such as a colonoscopy or flexible sigmoidoscopy) you may notice spotting of blood in your stool or on the toilet paper. If you underwent a bowel prep for your procedure, you may not have a normal bowel movement for a few days.  Please Note:  You might notice some irritation and congestion in your nose or some drainage.  This is from the oxygen used during your procedure.  There is no need for concern and it should clear up in a day or so.  SYMPTOMS TO REPORT IMMEDIATELY:   Following lower endoscopy (colonoscopy or flexible sigmoidoscopy):  Excessive amounts of blood in the stool  Significant tenderness or worsening of abdominal pains  Swelling of the abdomen that is new, acute  Fever of 100F or higher   A gastroenterologist can be reached at any hour by calling [phone].   DIET: Your first meal following the procedure should be a small meal and then it is ok to progress to your normal diet. Heavy or fried foods are harder to digest and may make you feel nauseous or bloated.  Likewise, meals heavy in dairy and vegetables can increase bloating.  Drink plenty of fluids but you should avoid alcoholic beverages for 24 hours.  ACTIVITY:  You should plan to take it  easy for the rest of today and you should NOT DRIVE or use heavy machinery until tomorrow (because of the sedation medicines used during the test).    FOLLOW UP: Our staff will call the number listed on your records the next business day following your procedure to check on you and address any questions or concerns that you may have regarding the information given to you following your procedure. If we do not reach you, we will leave a message.  However, if you are feeling well and you are not experiencing any problems, there is no need to return our call.  We will assume that you have returned to your regular daily activities without incident.  If any biopsies were taken you will be contacted by phone or by letter within the next 1-3 weeks.  Please call us at [phone] if you have not heard about the biopsies in 3 weeks.    SIGNATURES/CONFIDENTIALITY: You and/or your care partner have signed paperwork which will be entered into your electronic medical record.  These signatures attest to the fact that that the information above on your After Visit Summary has been reviewed and is understood.  Full responsibility of the confidentiality of this discharge information lies with you and/or your care-partner.  Wait biopsies.  [name] will mail you a letter with your appointment date and time. If you do not receive this by 06/21/14 please call the office for a  follow-up appointment-first available.

## 2014-06-14 NOTE — Progress Notes (Signed)
Patient awakening,vss,report to rn 

## 2014-06-15 ENCOUNTER — Telehealth: Payer: Self-pay

## 2014-06-15 NOTE — Telephone Encounter (Signed)
Left message on answering machine. 

## 2014-06-22 ENCOUNTER — Encounter: Payer: Self-pay | Admitting: Internal Medicine

## 2014-07-04 ENCOUNTER — Ambulatory Visit (INDEPENDENT_AMBULATORY_CARE_PROVIDER_SITE_OTHER): Payer: Medicare HMO | Admitting: Internal Medicine

## 2014-07-04 ENCOUNTER — Other Ambulatory Visit (INDEPENDENT_AMBULATORY_CARE_PROVIDER_SITE_OTHER): Payer: Medicare HMO

## 2014-07-04 ENCOUNTER — Encounter: Payer: Self-pay | Admitting: Internal Medicine

## 2014-07-04 VITALS — BP 130/70 | HR 90 | Ht 65.6 in | Wt 141.6 lb

## 2014-07-04 DIAGNOSIS — K50119 Crohn's disease of large intestine with unspecified complications: Secondary | ICD-10-CM

## 2014-07-04 DIAGNOSIS — R197 Diarrhea, unspecified: Secondary | ICD-10-CM

## 2014-07-04 LAB — VITAMIN B12: Vitamin B-12: 480 pg/mL (ref 211–911)

## 2014-07-04 LAB — SEDIMENTATION RATE: Sed Rate: 40 mm/hr — ABNORMAL HIGH (ref 0–22)

## 2014-07-04 MED ORDER — BUDESONIDE 3 MG PO CP24: 9.0000 mg | ORAL_CAPSULE | Freq: Every day | ORAL | Status: DC

## 2014-07-04 NOTE — Progress Notes (Signed)
[name] 05-Apr-1941 [id number]  Note: This dictation was prepared with Dragon digital system. Any transcriptional errors that result from this procedure are unintentional.   History of Present Illness: This is a 74 year old white female with inflammatory bowel disease. Positive IBD markers. Chronic diarrhea. History of multiple sclerosis treated with Gilenia .5 mg qd by Dr Jannifer Franklin. She has history of inflammatory polyps on colonoscopy in 2009. Adenomatous polyp in 2006 and cecal ulcer on the ileocecal valve on a recent colonoscopy in March 2016 which showed moderately severe diverticulosis of the left colon. She is intolerant to mesalamine. Biopsies of the colon were negative except for ileocecal valve which showed minimal active colitis with focal inflammation but no granulomas. Hhe has had 3 episodes of diverticulitis since August 2011 last attack 10/12/2013. She is currently asymptomatic    Past Medical History  Diagnosis Date  . Multiple sclerosis   . Gastritis   . Duodenitis   . Helicobacter pylori gastritis   . Hyperlipidemia   . Fatty liver   . Diverticulosis   . Hypertension   . IBD (inflammatory bowel disease)   . Arthritis, degenerative     both knees  . Memory loss     mild  . Colitis   . Memory difficulty 01/05/2013  . Vitamin B12 deficiency   . Osteopenia     Past Surgical History  Procedure Laterality Date  . Cholecystectomy    . Hemorrhoid surgery    . Tubal ligation    . Spinal fusion    . Rotator cuff repair    . Abdominal hysterectomy  2010  . Bladder surgery  2010    Bladder Tack  . Cervical spine surgery      Allergies  Allergen Reactions  . Vytorin [Ezetimibe-Simvastatin]     White skin patch  . Doxycycline     Raw mouth   . Erythromycin     REACTION: weakness  . Macrobid [Nitrofurantoin Macrocrystal] Hives  . Morphine   . Sulfonamide Derivatives Hives and Nausea And Vomiting  . Amoxicillin Hives    Family history and social history  have been reviewed.  Review of Systems: Average she has 3 bowel movements a day mostly loose. Occasional nocturnal diarrhea. Denies abdominal pain. Denies nausea vomiting or rectal bleeding  The remainder of the 10 point ROS is negative except as outlined in the H&P  Physical Exam: General Appearance Well developed, in no distress    Neurological Alert and oriented x 3 Psychological Normal mood and affect  Assessment and Plan:  Patient came to discuss results of colonoscopy which again  confirms inflammatory changes at the ileocecal valve suggestive of  limited inflammatory bowel disease. She also has a  history of inflammatory polyps and positive IBD markers. We will start her on trial of Entocort 9 mg daily for 4 weeks 6 mg daily for 4 weeks and 3 mg daily for 4 weeks. We will be checking her sedimentation rates today, B12 and sprue profile. Last CT scan of the abdomen in April 2015 showed thickening of the terminal ileum as well as diverticulitis causing sigmoid colon stranding. I will see her in 3 months  Cc Dr Jonathon Jordan  Delfin Edis 07/04/2014

## 2014-07-04 NOTE — Patient Instructions (Signed)
Your physician has requested that you go to the basement for the following lab work before leaving today: We have sent the following medications to your pharmacy for you to pick up at your convenience: Follow up with Dr Olevia Perches on 09/08/14 3:30 pm. CC:  Jonathon Jordan MD

## 2014-07-05 LAB — CELIAC PANEL 10
Endomysial Screen: NEGATIVE
GLIADIN IGA: 9 U (ref <20)
Gliadin IgG: 2 Units (ref <20)
IgA: 257 mg/dL (ref 69–380)
TISSUE TRANSGLUT AB: 1 U/mL (ref <6)
Tissue Transglutaminase Ab, IgA: 1 U/mL (ref <4)

## 2014-07-06 ENCOUNTER — Ambulatory Visit (INDEPENDENT_AMBULATORY_CARE_PROVIDER_SITE_OTHER): Payer: Medicare HMO | Admitting: Neurology

## 2014-07-06 ENCOUNTER — Encounter: Payer: Self-pay | Admitting: Neurology

## 2014-07-06 VITALS — BP 160/90 | HR 80 | Ht 65.0 in | Wt 143.2 lb

## 2014-07-06 DIAGNOSIS — E538 Deficiency of other specified B group vitamins: Secondary | ICD-10-CM

## 2014-07-06 DIAGNOSIS — Z5181 Encounter for therapeutic drug level monitoring: Secondary | ICD-10-CM

## 2014-07-06 DIAGNOSIS — G35 Multiple sclerosis: Secondary | ICD-10-CM | POA: Diagnosis not present

## 2014-07-06 DIAGNOSIS — R413 Other amnesia: Secondary | ICD-10-CM

## 2014-07-06 HISTORY — DX: Deficiency of other specified B group vitamins: E53.8

## 2014-07-06 NOTE — Patient Instructions (Signed)

## 2014-07-06 NOTE — Progress Notes (Signed)
Reason for visit: Multiple sclerosis  [name] is an 74 y.o. female  History of present illness:  [name] is a 74 year old left-handed white female with a history of multiple sclerosis. The patient has been on Gilenya, she is tolerating medication well. She reports some ongoing fatigue issues, but she has not had any new problems with numbness, weakness, visual disturbance, bowel or bladder control issues, or problems with balance. The patient does have issues with colitis, and a vitamin B12 deficiency. She has some fecal incontinence at times. The patient has been evaluated for a low white blood count, but this is an expected effect of the Gilenya. The patient feels that overall she is doing well. She has noted some mild problems with memory, she believes this has been stable. She is on Namenda for this. The patient also takes gabapentin. She returns to this office for an evaluation. The last MRI the brain was done in 2011, by my review appears to show fairly minimal periventricular white matter changes.  Past Medical History  Diagnosis Date  . Multiple sclerosis   . Gastritis   . Duodenitis   . Helicobacter pylori gastritis   . Hyperlipidemia   . Fatty liver   . Diverticulosis   . Hypertension   . IBD (inflammatory bowel disease)   . Arthritis, degenerative     both knees  . Memory loss     mild  . Colitis   . Memory difficulty 01/05/2013  . Vitamin B12 deficiency   . Osteopenia   . B12 deficiency 07/06/2014    Past Surgical History  Procedure Laterality Date  . Cholecystectomy    . Hemorrhoid surgery    . Tubal ligation    . Spinal fusion    . Rotator cuff repair    . Abdominal hysterectomy  2010  . Bladder surgery  2010    Bladder Tack  . Cervical spine surgery      Family History  Problem Relation Age of Onset  . Diabetes Father   . Heart disease Father   . Breast cancer Cousin   . Colon cancer Maternal Grandmother     ? may be duodenal cancer  .  Colon polyps Mother   . Colon polyps Brother   . Hypertension Brother     Social history:  reports that she has quit smoking. She has never used smokeless tobacco. She reports that she does not drink alcohol or use illicit drugs.    Allergies  Allergen Reactions  . Vytorin [Ezetimibe-Simvastatin]     White skin patch  . Doxycycline     Raw mouth   . Erythromycin     REACTION: weakness  . Macrobid [Nitrofurantoin Macrocrystal] Hives  . Morphine   . Sulfonamide Derivatives Hives and Nausea And Vomiting  . Amoxicillin Hives    Medications:  Prior to Admission medications   Medication Sig Start Date End Date Taking? Authorizing Provider  B Complex Vitamins (VITAMIN B COMPLEX PO) Take 1 tablet by mouth daily.    Yes Historical Provider, MD  budesonide (ENTOCORT EC) 3 MG 24 hr capsule Take 3 capsules (9 mg total) by mouth daily. 3 capsules daily for 4 weeks, then 2 per day for 4 weeks, then 1 per day for 4 weeks then stop 07/04/14  Yes [place] Dragon, MD  Calcium Carb-Cholecalciferol (CALCIUM 1000 + D PO) Take by mouth daily.   Yes Historical Provider, MD  carboxymethylcellulose 1 % ophthalmic solution Apply 1 drop  to eye daily.   Yes Historical Provider, MD  cetirizine (ZYRTEC) 10 MG tablet Take 10 mg by mouth as needed.    Yes Historical Provider, MD  Cholecalciferol (VITAMIN D-3) 5000 UNITS TABS Take 5,000 Units by mouth daily.   Yes Historical Provider, MD  desonide (DESOWEN) 0.05 % lotion Apply 1 application topically daily.   Yes Historical Provider, MD  Evening Primrose Oil 500 MG CAPS Take 1 capsule by mouth daily.   Yes Historical Provider, MD  fenofibrate (TRICOR) 145 MG tablet Take 145 mg by mouth daily.   Yes Historical Provider, MD  Fingolimod HCl (GILENYA) 0.5 MG CAPS Take 1 capsule (0.5 mg total) by mouth daily. 04/26/14  Yes Kathrynn Ducking, MD  fluticasone [facility]) 50 MCG/ACT nasal spray Place 1 spray into the nose as needed.  12/24/12  Yes Historical Provider, MD    gabapentin (NEURONTIN) 600 MG tablet Take 1 tablet (600 mg total) by mouth 3 (three) times daily. 04/25/14  Yes Kathrynn Ducking, MD  Glucosamine 500 MG TABS Take 1 tablet by mouth 2 (two) times daily.    Yes Historical Provider, MD  lovastatin (MEVACOR) 40 MG tablet Take 40 mg by mouth 2 (two) times daily.     Yes Historical Provider, MD  memantine (NAMENDA XR) 28 MG CP24 24 hr capsule Take 1 capsule (28 mg total) by mouth daily. 04/25/14  Yes Kathrynn Ducking, MD  Multiple Vitamin (MULTIVITAMIN) tablet Take 0.5 tablets by mouth daily.    Yes Historical Provider, MD  Omega-3 Fatty Acids (FISH OIL) 1200 MG CAPS Take 1 capsule by mouth daily.    Yes Historical Provider, MD  Probiotic Product (PROBIOTIC FORMULA PO) Take 1 tablet by mouth daily.     Yes Historical Provider, MD  vitamin B-12 (CYANOCOBALAMIN) 1000 MCG tablet Take 1,000 mcg by mouth daily.     Yes Historical Provider, MD    ROS:  Out of a complete 14 system review of symptoms, the patient complains only of the following symptoms, and all other reviewed systems are negative.  Runny nose Light sensitivity, blurred vision Heat intolerance Rectal bleeding, diarrhea, incontinence of bowels Environmental allergy Urinary urgency  Blood pressure 160/90, pulse 80, height 5' 5"  (1.651 m), weight 143 lb 3.2 oz (64.955 kg).  Physical Exam  General: The patient is alert and cooperative at the time of the examination.  Skin: No significant peripheral edema is noted.   Neurologic Exam  Mental status: The patient is alert and oriented x 3 at the time of the examination. The patient has apparent normal recent and remote memory, with an apparently normal attention span and concentration ability. Mini-Mental Status Examination done today shows a total score of 30/30.   Cranial nerves: Facial symmetry is present. Speech is normal, no aphasia or dysarthria is noted. Extraocular movements are full. Visual fields are full. Pupils are equal,  round, and reactive to light. Discs are flat bilaterally.  Motor: The patient has good strength in all 4 extremities.  Sensory examination: Soft touch sensation is symmetric on the face, arms, and legs.  Coordination: The patient has good finger-nose-finger and heel-to-shin bilaterally.  Gait and station: The patient has a normal gait. Tandem gait is normal. Romberg is negative. No drift is seen.  Reflexes: Deep tendon reflexes are symmetric.   Assessment/Plan:  1. Multiple sclerosis  2. Mild leukopenia on Gilenya  3. History of vitamin B12 deficiency  4. Mild memory disturbance  The patient overall is doing relatively well.  We will check blood work today, and recheck MRI of the brain, the last MRI was done in 2011. The patient will follow-up in 6 months. She will contact our office if new issues arise.  Jill Alexanders MD 07/06/2014 8:28 AM  [facility] [address] [place], [place] [address]  Phone [phone] Fax [phone]

## 2014-07-07 LAB — CBC WITH DIFFERENTIAL/PLATELET
Basophils Absolute: 0 10*3/uL (ref 0.0–0.2)
Basos: 1 %
EOS: 3 %
Eosinophils Absolute: 0.1 10*3/uL (ref 0.0–0.4)
HCT: 41.2 % (ref 34.0–46.6)
HEMOGLOBIN: 13.5 g/dL (ref 11.1–15.9)
IMMATURE GRANULOCYTES: 0 %
Immature Grans (Abs): 0 10*3/uL (ref 0.0–0.1)
Lymphocytes Absolute: 0.3 10*3/uL — ABNORMAL LOW (ref 0.7–3.1)
Lymphs: 9 %
MCH: 30.7 pg (ref 26.6–33.0)
MCHC: 32.8 g/dL (ref 31.5–35.7)
MCV: 94 fL (ref 79–97)
MONOCYTES: 9 %
Monocytes Absolute: 0.3 10*3/uL (ref 0.1–0.9)
Neutrophils Absolute: 2.7 10*3/uL (ref 1.4–7.0)
Neutrophils Relative %: 78 %
Platelets: 234 10*3/uL (ref 150–379)
RBC: 4.4 x10E6/uL (ref 3.77–5.28)
RDW: 14.4 % (ref 12.3–15.4)
WBC: 3.4 10*3/uL (ref 3.4–10.8)

## 2014-07-07 LAB — COMPREHENSIVE METABOLIC PANEL
ALT: 17 IU/L (ref 0–32)
AST: 27 IU/L (ref 0–40)
Albumin/Globulin Ratio: 1.5 (ref 1.1–2.5)
Albumin: 4.5 g/dL (ref 3.5–4.8)
Alkaline Phosphatase: 55 IU/L (ref 39–117)
BUN/Creatinine Ratio: 19 (ref 11–26)
BUN: 16 mg/dL (ref 8–27)
Bilirubin Total: 0.5 mg/dL (ref 0.0–1.2)
CALCIUM: 10.1 mg/dL (ref 8.7–10.3)
CO2: 25 mmol/L (ref 18–29)
CREATININE: 0.85 mg/dL (ref 0.57–1.00)
Chloride: 100 mmol/L (ref 97–108)
GFR calc non Af Amer: 69 mL/min/{1.73_m2} (ref >59)
GFR, EST AFRICAN AMERICAN: 79 mL/min/{1.73_m2} (ref >59)
Globulin, Total: 3 g/dL (ref 1.5–4.5)
Glucose: 135 mg/dL — ABNORMAL HIGH (ref 65–99)
Potassium: 4.2 mmol/L (ref 3.5–5.2)
Sodium: 140 mmol/L (ref 134–144)
TOTAL PROTEIN: 7.5 g/dL (ref 6.0–8.5)

## 2014-07-19 ENCOUNTER — Telehealth: Payer: Self-pay | Admitting: Neurology

## 2014-07-19 NOTE — Telephone Encounter (Signed)
I called back.  Got no answer.  Left message providing info we currently have on file.

## 2014-07-19 NOTE — Telephone Encounter (Signed)
Collie Siad with [facility] is calling to get insurance information for the patient in order to fill Gilenya 70m taking 1 capsule daily. Please call and advise. Thank you.

## 2014-07-20 ENCOUNTER — Ambulatory Visit
Admission: RE | Admit: 2014-07-20 | Discharge: 2014-07-20 | Disposition: A | Discharge status: Home / Self Care | Payer: Medicare HMO | Source: Ambulatory Visit | Attending: Neurology | Admitting: Neurology

## 2014-07-20 DIAGNOSIS — G35 Multiple sclerosis: Secondary | ICD-10-CM | POA: Diagnosis not present

## 2014-07-20 MED ORDER — GADOBENATE DIMEGLUMINE 529 MG/ML IV SOLN: 13.0000 mL | Freq: Once | INTRAVENOUS | Status: AC | PRN

## 2014-07-21 ENCOUNTER — Other Ambulatory Visit: Payer: Self-pay | Admitting: Neurology

## 2014-07-23 ENCOUNTER — Telehealth: Payer: Self-pay | Admitting: Neurology

## 2014-07-23 NOTE — Telephone Encounter (Signed)
  I called the patient. The MRI of the brain shows good stability from 2011 with the MS.  MRI brain 07/22/14:  IMPRESSION: Abnormal MRI scan of the brain showing scattered periventricular, subcortical white matter hyperintensities compatible with multiple sclerosis. No enhancing lesions are noted. There is mild degree of generalized cerebral atrophy. Overall no significant change compared with prior MRI scan dated 12/27/2009

## 2014-09-08 ENCOUNTER — Encounter: Payer: Self-pay | Admitting: Internal Medicine

## 2014-09-08 ENCOUNTER — Ambulatory Visit (INDEPENDENT_AMBULATORY_CARE_PROVIDER_SITE_OTHER): Payer: Medicare HMO | Admitting: Internal Medicine

## 2014-09-08 VITALS — BP 160/80 | HR 76 | Ht 65.75 in | Wt 143.5 lb

## 2014-09-08 DIAGNOSIS — R197 Diarrhea, unspecified: Secondary | ICD-10-CM

## 2014-09-08 DIAGNOSIS — K50119 Crohn's disease of large intestine with unspecified complications: Secondary | ICD-10-CM

## 2014-09-08 MED ORDER — LIDOCAINE (ANORECTAL) 5 % EX CREA: TOPICAL_CREAM | CUTANEOUS | Status: DC

## 2014-09-08 NOTE — Progress Notes (Signed)
[name] 05-09-1940 [id number]  Note: This dictation was prepared with Dragon digital system. Any transcriptional errors that result from this procedure are unintentional.   History of Present Illness: This is a 66 white female with inflammatory bowel disease and positive IBD markers. Chronic diarrhea and MS taking Gilenia followed by  Dr. Jannifer Franklin. She had cecal ulcer on most recent colonoscopy in March 2016 and moderately severe diverticulosis. She also had adenomatous polyp in 2006 and an inflammatory polyp in 2009.  CT scan of the abdomen in 07/14/2013 showed  nonspecific thickening of the terminal ileum. We started  Entecort 9 mg daily for 4 weeks and she tapered  to 6 mg daily for past 3 weeks. After next week she will go down to 3 mg daily. She feels much improved on budesonide. Unfortunately she is allergic to mesalamine and cannot take it. 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 Last white cell count 07/06/2014 was 3.4. She has gained total of fall 3 pounds since last appointment    Past Medical History  Diagnosis Date  . Multiple sclerosis   . Gastritis   . Duodenitis   . Helicobacter pylori gastritis   . Hyperlipidemia   . Fatty liver   . Diverticulosis   . Hypertension   . IBD (inflammatory bowel disease)   . Arthritis, degenerative     both knees  . Memory loss     mild  . Colitis   . Memory difficulty 01/05/2013  . Vitamin B12 deficiency   . Osteopenia   . B12 deficiency 07/06/2014    Past Surgical History  Procedure Laterality Date  . Cholecystectomy    . Hemorrhoid surgery    . Tubal ligation    . Spinal fusion    . Rotator cuff repair    . Abdominal hysterectomy  2010  . Bladder surgery  2010    Bladder Tack  . Cervical spine surgery      Allergies  Allergen Reactions  . Vytorin [Ezetimibe-Simvastatin]     White skin patch  . Doxycycline     Raw mouth   . Erythromycin     REACTION: weakness  .  Macrobid [Nitrofurantoin Macrocrystal] Hives  . Morphine   . Sulfonamide Derivatives Hives and Nausea And Vomiting  . Amoxicillin Hives    Family history and social history have been reviewed.  Review of Systems: Occasional diarrhea but mostly formed stools. Denies rectal bleeding. Denies abdominal pain  The remainder of the 10 point ROS is negative except as outlined in the H&P  Physical Exam: General Appearance Well developed, in no distress Eyes  Non icteric  HEENT  Non traumatic, normocephalic  Mouth No lesion, tongue papillated, no cheilosis Neck Supple without adenopathy, thyroid not enlarged, no carotid bruits, no JVD Lungs Clear to auscultation bilaterally COR Normal S1, normal S2, regular rhythm, no murmur, quiet precordium Abdomen soft nontender abdomen with normoactive bowel sounds. No distention or tympany Rectal not done Extremities  No pedal edema Skin No lesions Neurological Alert and oriented x 3 Psychological Normal mood and affect  Assessment and Plan:   74 year old white female with inflammatory bowel disease likely Crohn's colitis and possibly ileitis as per per abnormal CT scan of the abdomen last year. She is intolerant to mesalamine and we are  unable to use immunomodulators due to low white blood cell count. We will keep her on budesonide 3 mg daily until next appointment in  2 months. and if her symptoms stay as mild as they are at this point we may not consider biologicals. However if her diarrhea resumes we'll consider Humira or Entyvio.. I will see her end of July 2016    Delfin Edis 09/08/2014

## 2014-09-08 NOTE — Patient Instructions (Addendum)
We have sent the following medications to your pharmacy for you to pick up at your convenience:[facility]icare.  Continue budesonide 2 tablets daily x 1 week, then reduce to 1 tablet daily x 1 week. Continue this until appt with Dr. Olevia Perches on 11/07/14 at 1:45pm.   Please follow up with your primary care physician regarding your elevated blood pressure.  Dr Jonathon Jordan

## 2014-10-24 ENCOUNTER — Other Ambulatory Visit: Payer: Self-pay | Admitting: Neurology

## 2014-11-07 ENCOUNTER — Ambulatory Visit (INDEPENDENT_AMBULATORY_CARE_PROVIDER_SITE_OTHER): Payer: Medicare HMO | Admitting: Internal Medicine

## 2014-11-07 ENCOUNTER — Encounter: Payer: Self-pay | Admitting: Internal Medicine

## 2014-11-07 VITALS — BP 102/62 | HR 88 | Ht 65.75 in | Wt 141.1 lb

## 2014-11-07 DIAGNOSIS — K50119 Crohn's disease of large intestine with unspecified complications: Secondary | ICD-10-CM | POA: Diagnosis not present

## 2014-11-07 MED ORDER — BALSALAZIDE DISODIUM 750 MG PO CAPS: 1500.0000 mg | ORAL_CAPSULE | Freq: Every day | ORAL | Status: DC

## 2014-11-07 NOTE — Progress Notes (Signed)
[name] 1940-07-11 [id number]  Note: This dictation was prepared with Dragon digital system. Any transcriptional errors that result from this procedure are unintentional.   History of Present Illness: This is a 74 year old white female with Crohn's colitis and a positive IBD markers, also multiple sclerosis followed by Dr. Jannifer Franklin, currently on [place]. She has chronic low-grade diarrhea. Last colonoscopy in March 2016 confirmed cecal ulcer ( biopsies chronic minimally active colitis), normal TI biopsies, normal random Bx's of the colon. and diverticulosis. Prior colonoscopy in 2009 showed inflammatory polyps and in 2006 tubular adenoma. She just came off budesonide which was tapered from 9 mg to 3 mg. She is doing well. She has a chronica leukopenia  which is a contraindication for use of  immunomodulators. She is allergic to mesalamine. She did not want to use biologicals at this time. She takes occasionally Imodium when necessary diarrhea    Past Medical History  Diagnosis Date  . Multiple sclerosis   . Gastritis   . Duodenitis   . Helicobacter pylori gastritis   . Hyperlipidemia   . Fatty liver   . Diverticulosis   . Hypertension   . IBD (inflammatory bowel disease)   . Arthritis, degenerative     both knees  . Memory loss     mild  . Colitis   . Memory difficulty 01/05/2013  . Vitamin B12 deficiency   . Osteopenia   . B12 deficiency 07/06/2014    Past Surgical History  Procedure Laterality Date  . Cholecystectomy    . Hemorrhoid surgery    . Tubal ligation    . Spinal fusion    . Rotator cuff repair    . Abdominal hysterectomy  2010  . Bladder surgery  2010    Bladder Tack  . Cervical spine surgery      Allergies  Allergen Reactions  . Vytorin [Ezetimibe-Simvastatin]     White skin patch  . Doxycycline     Raw mouth   . Erythromycin     REACTION: weakness  . Macrobid [Nitrofurantoin Macrocrystal] Hives  . Morphine   . Sulfonamide Derivatives Hives and  Nausea And Vomiting  . Amoxicillin Hives    Family history and social history have been reviewed.  Review of Systems: Occasionally loose stools. Denies abdominal pain weight has been stable  The remainder of the 10 point ROS is negative except as outlined in the H&P  Physical Exam: General Appearance Well developed, in no distress Psychological Normal mood and affect  Assessment and Plan:  74 year old white female with the mild Crohn's colitis responded to budesonide. We will start: Colazal 750 mg 2 a day. She has a history of leukopenia. She does not qualify for  immunomodulators. She is allergic to mesalamine. She will transfer to care or Dr. Silverio Decamp following my retirement    Delfin Edis 11/07/2014

## 2014-11-07 NOTE — Patient Instructions (Addendum)
We have sent the following medications to your pharmacy for you to pick up at your convenience:Colazal. Dr Jonathon Jordan,

## 2015-01-09 ENCOUNTER — Ambulatory Visit (INDEPENDENT_AMBULATORY_CARE_PROVIDER_SITE_OTHER): Payer: Medicare HMO | Admitting: Nurse Practitioner

## 2015-01-09 ENCOUNTER — Encounter: Payer: Self-pay | Admitting: Nurse Practitioner

## 2015-01-09 VITALS — BP 125/84 | HR 88 | Ht 65.5 in | Wt 141.4 lb

## 2015-01-09 DIAGNOSIS — D72819 Decreased white blood cell count, unspecified: Secondary | ICD-10-CM | POA: Diagnosis not present

## 2015-01-09 DIAGNOSIS — G35 Multiple sclerosis: Secondary | ICD-10-CM | POA: Diagnosis not present

## 2015-01-09 DIAGNOSIS — G3184 Mild cognitive impairment, so stated: Secondary | ICD-10-CM | POA: Diagnosis not present

## 2015-01-09 DIAGNOSIS — Z5181 Encounter for therapeutic drug level monitoring: Secondary | ICD-10-CM

## 2015-01-09 NOTE — Progress Notes (Signed)
I have read the note, and I agree with the clinical assessment and plan.  WILLIS,CHARLES KEITH   

## 2015-01-09 NOTE — Progress Notes (Signed)
[facility]  PATIENT: [name] DOB: March 25, 1941   REASON FOR VISIT:  Follow-up for multiple sclerosis  mild cognitive impairment HISTORY FROM: patient    HISTORY OF PRESENT ILLNESS:[name] is a 74 year old left-handed white female with a history of multiple sclerosis. The patient has been on Gilenya, she is tolerating medication well. She reports some ongoing fatigue issues, but she has not had any new problems with numbness, weakness, visual disturbance, bowel or bladder control issues, or problems with balance. The patient does have issues with colitis, and a vitamin B12 deficiency. She has some fecal incontinence at times. The patient has been evaluated for a low white blood count, but this is an expected effect of the Gilenya. The patient feels that overall she is doing well. She has noted some mild  Cognitive impairment she believes this has been stable. She is on Namenda for this. The patient also takes gabapentin. She returns to this office for an evaluation. The last MRI the brain was done in 2016,  without significant change from 2011.  REVIEW OF SYSTEMS: Full 14 system review of systems performed and notable only for those listed, all others are neg:  Constitutional: neg  Cardiovascular: neg Ear/Nose/Throat: neg  Skin: neg Eyes: neg Respiratory: neg Gastroitestinal: neg  Hematology/Lymphatic: neg  Endocrine:  intolerance to heat and cold Musculoskeletal:neg Allergy/Immunology: neg Neurological:  Numbness,  Memory loss Psychiatric: neg Sleep : neg   ALLERGIES: Allergies  Allergen Reactions  . Vytorin [Ezetimibe-Simvastatin]     White skin patch  . Doxycycline     Raw mouth   . Erythromycin     REACTION: weakness  . Macrobid [Nitrofurantoin Macrocrystal] Hives  . Morphine   . Sulfonamide Derivatives Hives and Nausea And Vomiting  . Amoxicillin Hives    HOME MEDICATIONS: Outpatient Prescriptions Prior to Visit  Medication Sig  Dispense Refill  . B Complex Vitamins (VITAMIN B COMPLEX PO) Take 1 tablet by mouth daily.     . balsalazide (COLAZAL) 750 MG capsule Take 2 capsules (1,500 mg total) by mouth daily. 60 capsule 11  . Calcium Carb-Cholecalciferol (CALCIUM 1000 + D PO) Take by mouth daily.    . carboxymethylcellulose 1 % ophthalmic solution Apply 1 drop to eye daily.    . cetirizine (ZYRTEC) 10 MG tablet Take 10 mg by mouth as needed.     . Cholecalciferol (VITAMIN D-3) 5000 UNITS TABS Take 5,000 Units by mouth daily.    [name] Kitchen desonide (DESOWEN) 0.05 % lotion Apply 1 application topically daily.    . Evening Primrose Oil 500 MG CAPS Take 1 capsule by mouth daily.    . fenofibrate (TRICOR) 145 MG tablet Take 145 mg by mouth daily.    [name] Kitchen gabapentin (NEURONTIN) 600 MG tablet TAKE ONE TABLET BY MOUTH THREE TIMES DAILY 270 tablet 1  . GILENYA 0.5 MG CAPS TAKE ONE CAPSULE BY MOUTH DAILY 30 capsule 1  . Glucosamine 500 MG TABS Take 1 tablet by mouth 2 (two) times daily.     . Lidocaine, Anorectal, (RECTICARE) 5 % CREA Use topically twice daily 15 g 1  . lovastatin (MEVACOR) 40 MG tablet Take 40 mg by mouth 2 (two) times daily.      . memantine (NAMENDA XR) 28 MG CP24 24 hr capsule Take 1 capsule (28 mg total) by mouth daily. 90 capsule 1  . Multiple Vitamin (MULTIVITAMIN) tablet Take 0.5 tablets by mouth daily.     . Omega-3 Fatty Acids (FISH OIL) 1200 MG  CAPS Take 1 capsule by mouth daily.     . Probiotic Product (PROBIOTIC FORMULA PO) Take 1 tablet by mouth daily.      . vitamin B-12 (CYANOCOBALAMIN) 1000 MCG tablet Take 1,000 mcg by mouth daily.       No facility-administered medications prior to visit.    PAST MEDICAL HISTORY: Past Medical History  Diagnosis Date  . Multiple sclerosis   . Gastritis   . Duodenitis   . Helicobacter pylori gastritis   . Hyperlipidemia   . Fatty liver   . Diverticulosis   . Hypertension   . IBD (inflammatory bowel disease)   . Arthritis, degenerative     both knees  . Memory  loss     mild  . Colitis   . Memory difficulty 01/05/2013  . Vitamin B12 deficiency   . Osteopenia   . B12 deficiency 07/06/2014    PAST SURGICAL HISTORY: Past Surgical History  Procedure Laterality Date  . Cholecystectomy    . Hemorrhoid surgery    . Tubal ligation    . Spinal fusion    . Rotator cuff repair    . Abdominal hysterectomy  2010  . Bladder surgery  2010    Bladder Tack  . Cervical spine surgery      FAMILY HISTORY: Family History  Problem Relation Age of Onset  . Diabetes Father   . Heart disease Father   . Breast cancer Cousin   . Colon cancer Maternal Grandmother     ? may be duodenal cancer  . Colon polyps Mother   . Colon polyps Brother   . Hypertension Brother     SOCIAL HISTORY: Social History   Social History  . Marital Status: Married    Spouse Name: Eduard Clos   . Number of Children: 4  . Years of Education: 12+   Occupational History  . Retired     Social History Main Topics  . Smoking status: Former Research scientist (life sciences)  . Smokeless tobacco: Never Used  . Alcohol Use: No     Comment: rare  . Drug Use: No  . Sexual Activity: Not on file   Other Topics Concern  . Not on file   Social History Narrative   Caffeine daily.    Patient lives at home with husband Eduard Clos but goes by [employer].    Patient has 4 children.    Patient has 1 year of college.    Patient is retired.    Patient is left handed.    Patient drinks 2 cups of coffee per day.     PHYSICAL EXAM  Filed Vitals:   01/09/15 0754  BP: 125/84  Pulse: 88  Height: 5' 5.5" (1.664 m)  Weight: 141 lb 6.4 oz (64.139 kg)   Body mass index is 23.16 kg/(m^2). General: The patient is alert and cooperative at the time of the examination. Skin: No significant peripheral edema is noted.  Neurologic Exam Mental status: The patient is alert and oriented x 3 at the time of the examination. The patient has apparent normal recent and remote memory, with an apparently normal attention span and  concentration ability. Mini-Mental Status Examination done today shows a total score of 30/30.AFT 17. Clock drawing 4/4.  Cranial nerves: Facial symmetry is present. Speech is normal, no aphasia or dysarthria is noted. Extraocular movements are full. Visual fields are full. Pupils are equal, round, and reactive to light. Discs are flat bilaterally. Motor: The patient has good strength in all 4 extremities. No  focal weakness Sensory examination: Soft touch sensation is symmetric on the face, arms, and legs. Coordination: The patient has good finger-nose-finger and heel-to-shin bilaterally. Gait and station: The patient has a normal gait. Tandem gait is normal. Romberg is negative. No drift is seen. Reflexes: Deep tendon reflexes are symmetric upper and lower.  DIAGNOSTIC DATA (LABS, IMAGING, TESTING) - I reviewed patient records, labs, notes, testing and imaging myself where available.  Lab Results  Component Value Date   WBC 3.4 07/06/2014   HGB 13.5 07/06/2014   HCT 41.2 07/06/2014   MCV 94 07/06/2014   PLT 234 07/06/2014      Component Value Date/Time   NA 140 07/06/2014 0830   NA 139 07/29/2013 1740   NA 141 05/06/2013 0926   K 4.2 07/06/2014 0830   K 3.9 05/06/2013 0926   CL 100 07/06/2014 0830   CO2 25 07/06/2014 0830   CO2 26 05/06/2013 0926   GLUCOSE 135* 07/06/2014 0830   GLUCOSE 136* 07/29/2013 1740   GLUCOSE 119 05/06/2013 0926   BUN 16 07/06/2014 0830   BUN 33* 07/29/2013 1740   BUN 21.6 05/06/2013 0926   CREATININE 0.85 07/06/2014 0830   CREATININE 0.8 05/06/2013 0926   CALCIUM 10.1 07/06/2014 0830   CALCIUM 10.0 05/06/2013 0926   PROT 7.5 07/06/2014 0830   PROT 7.4 07/29/2013 1740   PROT 7.7 05/06/2013 0926   ALBUMIN 3.8 07/29/2013 1740   ALBUMIN 4.1 05/06/2013 0926   AST 27 07/06/2014 0830   AST 24 05/06/2013 0926   ALT 17 07/06/2014 0830   ALT 16 05/06/2013 0926   ALKPHOS 55 07/06/2014 0830   ALKPHOS 49 05/06/2013 0926   BILITOT 0.5 07/06/2014 0830    BILITOT 0.3 01/05/2014 0916   BILITOT 0.58 05/06/2013 0926   GFRNONAA 69 07/06/2014 0830   GFRAA 79 07/06/2014 0830    Lab Results  Component Value Date   VITAMINB12 480 07/04/2014    ASSESSMENT AND PLAN  74 y.o. year old female  has a past medical history of Multiple sclerosis; Hyperlipidemia; IBD (inflammatory bowel disease); Arthritis, degenerative; Memory loss;  Vitamin B12 deficiency;  here to follow up.The last MRI the brain was done in 2016,  without significant change from 2011.   Continue Gilenya at current dose Get labs today , CBC and CMP to monitor adverse effects on Gilenya particularly lymphocyte count  Continue Namenda at current dose Memory score is stable FU in 6 months Dennie Bible, [facility], [facility], APRN  [facility] [address] [place], [place] [address] [phone]

## 2015-01-09 NOTE — Patient Instructions (Addendum)
Continue Gilenya at current dose Get labs today  Continue Namenda at current dose Memory score is stable FU in 6 months

## 2015-01-10 ENCOUNTER — Other Ambulatory Visit: Payer: Self-pay | Admitting: Neurology

## 2015-01-10 ENCOUNTER — Telehealth: Payer: Self-pay | Admitting: Nurse Practitioner

## 2015-01-10 LAB — COMPREHENSIVE METABOLIC PANEL
ALBUMIN: 4.2 g/dL (ref 3.5–4.8)
ALT: 16 IU/L (ref 0–32)
AST: 28 IU/L (ref 0–40)
Albumin/Globulin Ratio: 1.4 (ref 1.1–2.5)
Alkaline Phosphatase: 62 IU/L (ref 39–117)
BUN / CREAT RATIO: 20 (ref 11–26)
BUN: 16 mg/dL (ref 8–27)
Bilirubin Total: 0.4 mg/dL (ref 0.0–1.2)
CALCIUM: 9.9 mg/dL (ref 8.7–10.3)
CO2: 24 mmol/L (ref 18–29)
CREATININE: 0.81 mg/dL (ref 0.57–1.00)
Chloride: 101 mmol/L (ref 97–108)
GFR, EST AFRICAN AMERICAN: 83 mL/min/{1.73_m2} (ref >59)
GFR, EST NON AFRICAN AMERICAN: 72 mL/min/{1.73_m2} (ref >59)
GLUCOSE: 110 mg/dL — AB (ref 65–99)
Globulin, Total: 2.9 g/dL (ref 1.5–4.5)
Potassium: 4.1 mmol/L (ref 3.5–5.2)
Sodium: 142 mmol/L (ref 134–144)
Total Protein: 7.1 g/dL (ref 6.0–8.5)

## 2015-01-10 LAB — CBC WITH DIFFERENTIAL/PLATELET
BASOS ABS: 0 10*3/uL (ref 0.0–0.2)
Basos: 0 %
EOS (ABSOLUTE): 0.1 10*3/uL (ref 0.0–0.4)
EOS: 4 %
HEMOGLOBIN: 12.8 g/dL (ref 11.1–15.9)
Hematocrit: 40.3 % (ref 34.0–46.6)
Immature Grans (Abs): 0 10*3/uL (ref 0.0–0.1)
Immature Granulocytes: 1 %
Lymphocytes Absolute: 0.3 10*3/uL — ABNORMAL LOW (ref 0.7–3.1)
Lymphs: 8 %
MCH: 29.9 pg (ref 26.6–33.0)
MCHC: 31.8 g/dL (ref 31.5–35.7)
MCV: 94 fL (ref 79–97)
MONOCYTES: 12 %
Monocytes Absolute: 0.4 10*3/uL (ref 0.1–0.9)
Neutrophils Absolute: 2.7 10*3/uL (ref 1.4–7.0)
Neutrophils: 75 %
Platelets: 271 10*3/uL (ref 150–379)
RBC: 4.28 x10E6/uL (ref 3.77–5.28)
RDW: 13.9 % (ref 12.3–15.4)
WBC: 3.6 10*3/uL (ref 3.4–10.8)

## 2015-01-10 NOTE — Telephone Encounter (Signed)
I called and spoke to pt and relayed her lab results as stable.  She verbalized understanding.   I released results to mychart.

## 2015-01-10 NOTE — Telephone Encounter (Signed)
Labs are stable please call the patient

## 2015-01-23 ENCOUNTER — Other Ambulatory Visit: Payer: Self-pay | Admitting: Neurology

## 2015-03-12 ENCOUNTER — Other Ambulatory Visit: Payer: Self-pay | Admitting: Neurology

## 2015-03-30 ENCOUNTER — Telehealth: Payer: Self-pay | Admitting: Neurology

## 2015-03-30 NOTE — Telephone Encounter (Signed)
Patient called regarding 73 yr old grandson diagnosed with RSV, daughter wonders if she should cancel her trip to avoid exposure to patient. Please call to advise if ok for pt to be around daughter and grandson.

## 2015-03-30 NOTE — Telephone Encounter (Signed)
Spoke to patient. She says her daughter and 60 month old grandson who has RSV were coming to visit for the holidays, but does not think it is a good idea now with patient's low white blood count in the past and MS therapy. Requesting advice. Explained to  patient the difficulty to make determination. Advised patient to have daughter to contact pediatrician to get his opinion being that the patient says she feels fine and her grandson now just has an ear infection. Patient was very understanding and agreed.

## 2015-03-30 NOTE — Telephone Encounter (Signed)
Returned call. No answer.

## 2015-04-24 ENCOUNTER — Telehealth: Payer: Self-pay | Admitting: Neurology

## 2015-04-24 NOTE — Telephone Encounter (Signed)
Ins has been contacted.  Request is under review Ref # [facility]  Holland Falling has approved the request for coverage on Gilenya effective until 04/13/2016 Ref # [id number]

## 2015-04-24 NOTE — Telephone Encounter (Signed)
Patient called to advise certification is due for renewal on GILENYA 0.5 MG CAPS this month in order for her to get more refills.

## 2015-04-26 ENCOUNTER — Other Ambulatory Visit: Payer: Self-pay | Admitting: Neurology

## 2015-04-30 DIAGNOSIS — R0981 Nasal congestion: Secondary | ICD-10-CM | POA: Diagnosis not present

## 2015-05-21 ENCOUNTER — Telehealth: Payer: Self-pay | Admitting: Neurology

## 2015-05-21 DIAGNOSIS — Z1231 Encounter for screening mammogram for malignant neoplasm of breast: Secondary | ICD-10-CM | POA: Diagnosis not present

## 2015-05-21 NOTE — Telephone Encounter (Signed)
Tim with [facility] [phone] opt [phone] ext [phone] called sts need updated Start form (SRF) for pt.

## 2015-05-22 NOTE — Telephone Encounter (Signed)
Form completed, signed and faxed to [phone].

## 2015-05-30 ENCOUNTER — Encounter: Payer: Self-pay | Admitting: Internal Medicine

## 2015-06-12 DIAGNOSIS — R69 Illness, unspecified: Secondary | ICD-10-CM | POA: Diagnosis not present

## 2015-06-28 ENCOUNTER — Ambulatory Visit (INDEPENDENT_AMBULATORY_CARE_PROVIDER_SITE_OTHER): Payer: Medicare HMO | Admitting: Nurse Practitioner

## 2015-06-28 ENCOUNTER — Encounter: Payer: Self-pay | Admitting: Nurse Practitioner

## 2015-06-28 VITALS — BP 139/86 | HR 72 | Ht 65.5 in | Wt 137.4 lb

## 2015-06-28 DIAGNOSIS — Z5181 Encounter for therapeutic drug level monitoring: Secondary | ICD-10-CM | POA: Diagnosis not present

## 2015-06-28 DIAGNOSIS — G35 Multiple sclerosis: Secondary | ICD-10-CM | POA: Diagnosis not present

## 2015-06-28 DIAGNOSIS — G3184 Mild cognitive impairment, so stated: Secondary | ICD-10-CM

## 2015-06-28 NOTE — Progress Notes (Signed)
I have read the note, and I agree with the clinical assessment and plan.  WILLIS,CHARLES KEITH   

## 2015-06-28 NOTE — Patient Instructions (Signed)
Continue Gilenya at current dose Get labs today , CBC and CMP to monitor adverse effects on Gilenya particularly lymphocyte count and Vitamin D level Continue Namenda at current dose will refill patient will call back with pharmacy Memory score is stable FU in 6 months

## 2015-06-28 NOTE — Progress Notes (Signed)
[facility]  PATIENT: [name] DOB: 12-Jul-1940   REASON FOR VISIT: Follow-up for multiple sclerosis mild cognitive impairment HISTORY FROM: Patient    HISTORY OF PRESENT ILLNESS:[name] is a 75 year old left-handed white female with a history of multiple sclerosis. The patient has been on Gilenya, she is tolerating medication well. She reports some ongoing fatigue issues, but she has not had any new problems with numbness, weakness,  bowel or bladder control issues, or problems with balance. She does have cataracts and is supposed to have surgery by Dr. Katy Fitch.  The patient does have issues with colitis, and a vitamin B12 deficiency. She has some fecal incontinence at times, none recently. The patient has been evaluated for a low white blood count, but this is an expected effect of the Gilenya. The patient feels that overall she is doing well. She has noted some mild Cognitive impairment she believes this has been stable. She is on Namenda for this. The patient also takes gabapentin. She has some increased anxiety at present because her divorced daughter with grandchild is living with her temporarily . She participates in yoga and also walks for exercise when the weather is nice. She returns to this office for an evaluation. The last MRI the brain was done in 2016, without significant change from 2011.    REVIEW OF SYSTEMS: Full 14 system review of systems performed and notable only for those listed, all others are neg:  Constitutional: neg  Cardiovascular: neg Ear/Nose/Throat: Hearing loss  Skin: neg Eyes: Blurred vision Respiratory: neg Gastroitestinal: neg  Hematology/Lymphatic: neg  Endocrine: Intolerance to heat Musculoskeletal:neg Allergy/Immunology: neg Neurological: Mild cognitive impairment, occasional numbness Psychiatric: neg Sleep : neg   ALLERGIES: Allergies  Allergen Reactions  . Vytorin [Ezetimibe-Simvastatin]     White skin patch    . Doxycycline     Raw mouth   . Erythromycin     REACTION: weakness  . Macrobid [Nitrofurantoin Macrocrystal] Hives  . Morphine Nausea And Vomiting  . Sulfonamide Derivatives Hives and Nausea And Vomiting  . Amoxicillin Hives    HOME MEDICATIONS: Outpatient Prescriptions Prior to Visit  Medication Sig Dispense Refill  . B Complex Vitamins (VITAMIN B COMPLEX PO) Take 1 tablet by mouth daily.     . Calcium Carb-Cholecalciferol (CALCIUM 1000 + D PO) Take by mouth daily.    . carboxymethylcellulose 1 % ophthalmic solution Apply 1 drop to eye daily.    . Cholecalciferol (VITAMIN D-3) 5000 UNITS TABS Take 5,000 Units by mouth daily.    [name] Kitchen desonide (DESOWEN) 0.05 % lotion Apply 1 application topically daily.    . Evening Primrose Oil 500 MG CAPS Take 1 capsule by mouth daily.    . fenofibrate (TRICOR) 145 MG tablet Take 145 mg by mouth daily.    [name] Kitchen gabapentin (NEURONTIN) 600 MG tablet TAKE ONE TABLET BY MOUTH THREE TIMES DAILY 270 tablet 1  . GILENYA 0.5 MG CAPS TAKE ONE CAPSULE BY MOUTH DAILY 30 capsule 6  . Glucosamine 500 MG TABS Take 1 tablet by mouth 2 (two) times daily.     [name] Kitchen lovastatin (MEVACOR) 40 MG tablet Take 40 mg by mouth 2 (two) times daily.      . Multiple Vitamin (MULTIVITAMIN) tablet Take 0.5 tablets by mouth daily.     [name] Kitchen NAMENDA XR 28 MG CP24 24 hr capsule TAKE ONE TABLET BY MOUTH DAILY 90 capsule 0  . Omega-3 Fatty Acids (FISH OIL) 1200 MG CAPS Take 1 capsule by mouth daily.     [name] Kitchen  Probiotic Product (PROBIOTIC FORMULA PO) Take 1 tablet by mouth daily.      . vitamin B-12 (CYANOCOBALAMIN) 1000 MCG tablet Take 1,000 mcg by mouth daily.      . Lidocaine, Anorectal, (RECTICARE) 5 % CREA Use topically twice daily (Patient not taking: Reported on 06/28/2015) 15 g 1  . balsalazide (COLAZAL) 750 MG capsule Take 2 capsules (1,500 mg total) by mouth daily. 60 capsule 11  . cetirizine (ZYRTEC) 10 MG tablet Take 10 mg by mouth as needed.     [name] Kitchen ESTRACE VAGINAL 0.1 MG/GM vaginal cream  INSERT 1 G VAGINALLY TWICE A WEEK AS DIRECTED.  2   No facility-administered medications prior to visit.    PAST MEDICAL HISTORY: Past Medical History  Diagnosis Date  . Multiple sclerosis ([place])   . Gastritis   . Duodenitis   . Helicobacter pylori gastritis   . Hyperlipidemia   . Fatty liver   . Diverticulosis   . Hypertension   . IBD (inflammatory bowel disease)   . Arthritis, degenerative     both knees  . Memory loss     mild  . Colitis   . Memory difficulty 01/05/2013  . Vitamin B12 deficiency   . Osteopenia   . B12 deficiency 07/06/2014    PAST SURGICAL HISTORY: Past Surgical History  Procedure Laterality Date  . Cholecystectomy    . Hemorrhoid surgery    . Tubal ligation    . Spinal fusion    . Rotator cuff repair    . Abdominal hysterectomy  2010  . Bladder surgery  2010    Bladder Tack  . Cervical spine surgery      FAMILY HISTORY: Family History  Problem Relation Age of Onset  . Diabetes Father   . Heart disease Father   . Breast cancer Cousin   . Colon cancer Maternal Grandmother     ? may be duodenal cancer  . Colon polyps Mother   . Colon polyps Brother   . Hypertension Brother     SOCIAL HISTORY: Social History   Social History  . Marital Status: Married    Spouse Name: [name]   . Number of Children: 4  . Years of Education: 12+   Occupational History  . Retired     Social History Main Topics  . Smoking status: Former Smoker    Quit date: 06/27/1997  . Smokeless tobacco: Never Used  . Alcohol Use: No     Comment: rare  . Drug Use: No  . Sexual Activity: Not on file   Other Topics Concern  . Not on file   Social History Narrative   Caffeine daily.    Patient lives at home with husband [name] but goes by [employer].    Patient has 4 children.    Patient has 1 year of college.    Patient is retired.    Patient is left handed.    Patient drinks 2 cups of coffee per day.     PHYSICAL EXAM  Filed Vitals:   06/28/15 0753    Height: 5' 5.5" (1.664 m)  Weight: 137 lb 6.4 oz (62.324 kg)   Body mass index is 22.51 kg/(m^2). General: The patient is alert and cooperative at the time of the examination. Skin: No significant peripheral edema is noted.  Neurologic Exam Mental status: The patient is alert and oriented x 3 at the time of the examination. The patient has apparent normal recent and remote memory, with an apparently normal attention  span and concentration ability. Mini-Mental Status Examination done today shows a total score of 30/30.AFT 16. Clock drawing 4/4.  Cranial nerves: Facial symmetry is present. Speech is normal, no aphasia or dysarthria is noted. Extraocular movements are full. Visual fields are full. Pupils are equal, round, and reactive to light. Discs are flat bilaterally. Visual acuity 20/30 bilaterally. Motor: The patient has good strength in all 4 extremities. No focal weakness Sensory examination: Soft touch sensation, pinprick and vibratory are symmetric on the face, arms, and legs. Coordination: The patient has good finger-nose-finger and heel-to-shin bilaterally. Gait and station: The patient has a normal gait. Tandem gait is normal. Romberg is negative. No drift is seen. Reflexes: Deep tendon reflexes are symmetric upper and lower.  DIAGNOSTIC DATA (LABS, IMAGING, TESTING) - I reviewed patient records, labs, notes, testing and imaging myself where available.  Lab Results  Component Value Date   WBC 3.6 01/09/2015   HGB 13.5 07/06/2014   HCT 40.3 01/09/2015   MCV 94 01/09/2015   PLT 271 01/09/2015      Component Value Date/Time   NA 142 01/09/2015 0851   NA 139 07/29/2013 1740   NA 141 05/06/2013 0926   K 4.1 01/09/2015 0851   K 3.9 05/06/2013 0926   CL 101 01/09/2015 0851   CO2 24 01/09/2015 0851   CO2 26 05/06/2013 0926   GLUCOSE 110* 01/09/2015 0851   GLUCOSE 136* 07/29/2013 1740   GLUCOSE 119 05/06/2013 0926   BUN 16 01/09/2015 0851   BUN 33* 07/29/2013 1740   BUN  21.6 05/06/2013 0926   CREATININE 0.81 01/09/2015 0851   CREATININE 0.8 05/06/2013 0926   CALCIUM 9.9 01/09/2015 0851   CALCIUM 10.0 05/06/2013 0926   PROT 7.1 01/09/2015 0851   PROT 7.4 07/29/2013 1740   PROT 7.7 05/06/2013 0926   ALBUMIN 4.2 01/09/2015 0851   ALBUMIN 3.8 07/29/2013 1740   ALBUMIN 4.1 05/06/2013 0926   AST 28 01/09/2015 0851   AST 24 05/06/2013 0926   ALT 16 01/09/2015 0851   ALT 16 05/06/2013 0926   ALKPHOS 62 01/09/2015 0851   ALKPHOS 49 05/06/2013 0926   BILITOT 0.4 01/09/2015 0851   BILITOT 0.3 01/05/2014 0916   BILITOT 0.58 05/06/2013 0926   GFRNONAA 72 01/09/2015 0851   GFRAA 83 01/09/2015 0851   ASSESSMENT AND PLAN 75 y.o. year old female has a past medical history of Multiple sclerosis; Hyperlipidemia; IBD (inflammatory bowel disease); Arthritis, degenerative; Memory loss; Vitamin B12 deficiency; vitamin D deficiency here to follow up.The last MRI the brain was done in 2016, without significant change from 2011.   Continue Gilenya at current dose Get labs today , CBC and CMP to monitor adverse effects on Gilenya particularly lymphocyte count and vitamin D level Continue Namenda at current dose, patient will call with the correct mail order drug company Continue Neurontin at current dose, patient to call the correct mail order drug company Memory score is stable Continue yoga and walking for exercise FU in 6 months Dennie Bible, [facility], [facility], [facility] [address] [place], [place] [address] [phone]

## 2015-06-29 ENCOUNTER — Telehealth: Payer: Self-pay | Admitting: *Deleted

## 2015-06-29 LAB — CBC WITH DIFFERENTIAL/PLATELET
BASOS ABS: 0 10*3/uL (ref 0.0–0.2)
Basos: 1 %
EOS (ABSOLUTE): 0.1 10*3/uL (ref 0.0–0.4)
Eos: 3 %
HEMOGLOBIN: 13.3 g/dL (ref 11.1–15.9)
Hematocrit: 42.1 % (ref 34.0–46.6)
IMMATURE GRANULOCYTES: 1 %
Immature Grans (Abs): 0 10*3/uL (ref 0.0–0.1)
LYMPHS ABS: 0.1 10*3/uL — AB (ref 0.7–3.1)
LYMPHS: 4 %
MCH: 29.7 pg (ref 26.6–33.0)
MCHC: 31.6 g/dL (ref 31.5–35.7)
MCV: 94 fL (ref 79–97)
MONOCYTES: 14 %
Monocytes Absolute: 0.5 10*3/uL (ref 0.1–0.9)
Neutrophils Absolute: 3 10*3/uL (ref 1.4–7.0)
Neutrophils: 77 %
Platelets: 292 10*3/uL (ref 150–379)
RBC: 4.48 x10E6/uL (ref 3.77–5.28)
RDW: 14.1 % (ref 12.3–15.4)
WBC: 3.8 10*3/uL (ref 3.4–10.8)

## 2015-06-29 LAB — COMPREHENSIVE METABOLIC PANEL
ALK PHOS: 59 IU/L (ref 39–117)
ALT: 16 IU/L (ref 0–32)
AST: 29 IU/L (ref 0–40)
Albumin/Globulin Ratio: 1.7 (ref 1.2–2.2)
Albumin: 4.6 g/dL (ref 3.5–4.8)
BILIRUBIN TOTAL: 0.5 mg/dL (ref 0.0–1.2)
BUN/Creatinine Ratio: 25 (ref 11–26)
BUN: 19 mg/dL (ref 8–27)
CHLORIDE: 98 mmol/L (ref 96–106)
CO2: 26 mmol/L (ref 18–29)
Calcium: 10.3 mg/dL (ref 8.7–10.3)
Creatinine, Ser: 0.76 mg/dL (ref 0.57–1.00)
GFR calc Af Amer: 90 mL/min/{1.73_m2} (ref >59)
GFR calc non Af Amer: 78 mL/min/{1.73_m2} (ref >59)
Globulin, Total: 2.7 g/dL (ref 1.5–4.5)
Glucose: 98 mg/dL (ref 65–99)
POTASSIUM: 5 mmol/L (ref 3.5–5.2)
Sodium: 141 mmol/L (ref 134–144)
Total Protein: 7.3 g/dL (ref 6.0–8.5)

## 2015-06-29 LAB — VITAMIN D 25 HYDROXY (VIT D DEFICIENCY, FRACTURES): VIT D 25 HYDROXY: 45 ng/mL (ref 30.0–100.0)

## 2015-06-29 NOTE — Telephone Encounter (Signed)
-----   Message from Dennie Bible, NP sent at 06/29/2015  9:50 AM EDT ----- Please have patient repeat CBC in 1 month. Vitamin D level is 45. Continue Vitamin D at current dose. CMP normal. Please call

## 2015-06-29 NOTE — Telephone Encounter (Signed)
LMVM for pt to return call for lab results.  

## 2015-07-02 NOTE — Telephone Encounter (Signed)
I gave her the labs results.   She verbalized understanding.  She will come in one month and repeat CBC.

## 2015-07-02 NOTE — Telephone Encounter (Signed)
Patient returned Sandy's call

## 2015-07-04 ENCOUNTER — Telehealth: Payer: Self-pay | Admitting: Nurse Practitioner

## 2015-07-04 ENCOUNTER — Telehealth: Payer: Self-pay

## 2015-07-04 MED ORDER — GABAPENTIN 600 MG PO TABS: 600.0000 mg | ORAL_TABLET | Freq: Three times a day (TID) | ORAL | Status: DC

## 2015-07-04 MED ORDER — MEMANTINE HCL ER 28 MG PO CP24: 28.0000 mg | ORAL_CAPSULE | Freq: Every day | ORAL | Status: DC

## 2015-07-04 NOTE — Telephone Encounter (Signed)
Patient called to advise [name] is in the car with her and has just been diagnosed positive flu, Grandson's Pediatrician Dr. Elnita Maxwell recommended she call our office, for possible tamiflu for her due to her recent labs (lymphocytes) being low. Try to send to [name] (recommended to call 1st in case they don't have Tamiflu) as [name] is out of Tamiflu.[name] Kitchen

## 2015-07-04 NOTE — Telephone Encounter (Signed)
Spoke with patient who expressed concern over her recent lab results and taking tamiflu. Discussed with Daun Peacock, NP who suggested patient call her PCP for medication. Called patient back and advised. She verbalized understanding, appreciation.

## 2015-07-04 NOTE — Telephone Encounter (Signed)
Refill request came in, refills sent to to patient's pharmacy

## 2015-07-10 ENCOUNTER — Ambulatory Visit: Payer: Medicare HMO | Admitting: Nurse Practitioner

## 2015-08-01 ENCOUNTER — Other Ambulatory Visit (INDEPENDENT_AMBULATORY_CARE_PROVIDER_SITE_OTHER): Payer: Self-pay

## 2015-08-01 ENCOUNTER — Other Ambulatory Visit: Payer: Self-pay | Admitting: *Deleted

## 2015-08-01 DIAGNOSIS — G35 Multiple sclerosis: Secondary | ICD-10-CM

## 2015-08-01 DIAGNOSIS — Z0289 Encounter for other administrative examinations: Secondary | ICD-10-CM

## 2015-08-01 NOTE — Progress Notes (Signed)
Pt came in for CBC diff (repeat one month).  Placed order.

## 2015-08-02 ENCOUNTER — Telehealth: Payer: Self-pay | Admitting: Nurse Practitioner

## 2015-08-02 LAB — CBC WITH DIFFERENTIAL/PLATELET
Basophils Absolute: 0 10*3/uL (ref 0.0–0.2)
Basos: 0 %
EOS (ABSOLUTE): 0.1 10*3/uL (ref 0.0–0.4)
Eos: 3 %
HEMATOCRIT: 39.3 % (ref 34.0–46.6)
HEMOGLOBIN: 12.9 g/dL (ref 11.1–15.9)
Immature Grans (Abs): 0 10*3/uL (ref 0.0–0.1)
Immature Granulocytes: 1 %
LYMPHS: 9 %
Lymphocytes Absolute: 0.3 10*3/uL — ABNORMAL LOW (ref 0.7–3.1)
MCH: 30.4 pg (ref 26.6–33.0)
MCHC: 32.8 g/dL (ref 31.5–35.7)
MCV: 93 fL (ref 79–97)
MONOCYTES: 8 %
Monocytes Absolute: 0.2 10*3/uL (ref 0.1–0.9)
Neutrophils Absolute: 2.4 10*3/uL (ref 1.4–7.0)
Neutrophils: 79 %
Platelets: 227 10*3/uL (ref 150–379)
RBC: 4.24 x10E6/uL (ref 3.77–5.28)
RDW: 14.4 % (ref 12.3–15.4)
WBC: 3.1 10*3/uL — AB (ref 3.4–10.8)

## 2015-08-02 NOTE — Telephone Encounter (Signed)
Called and spoke to patient relayed labs were stable. Patient understood.

## 2015-08-02 NOTE — Telephone Encounter (Signed)
-----   Message from Dennie Bible, NP sent at 08/02/2015  7:57 AM EDT ----- Labs stable please call the patient

## 2015-08-02 NOTE — Progress Notes (Signed)
Quick Note:  Called and spoke to patient relayed labs were stable. Patient understood. ______

## 2015-08-02 NOTE — Progress Notes (Signed)
Quick Note:  Spoke to patient relayed labs were stable. Patient understood. ______

## 2015-08-23 DIAGNOSIS — H04123 Dry eye syndrome of bilateral lacrimal glands: Secondary | ICD-10-CM | POA: Diagnosis not present

## 2015-08-23 DIAGNOSIS — Z09 Encounter for follow-up examination after completed treatment for conditions other than malignant neoplasm: Secondary | ICD-10-CM | POA: Diagnosis not present

## 2015-08-23 DIAGNOSIS — Z79899 Other long term (current) drug therapy: Secondary | ICD-10-CM | POA: Diagnosis not present

## 2015-08-23 DIAGNOSIS — G35 Multiple sclerosis: Secondary | ICD-10-CM | POA: Diagnosis not present

## 2015-08-23 DIAGNOSIS — H2513 Age-related nuclear cataract, bilateral: Secondary | ICD-10-CM | POA: Diagnosis not present

## 2015-08-23 DIAGNOSIS — H18423 Band keratopathy, bilateral: Secondary | ICD-10-CM | POA: Diagnosis not present

## 2015-08-31 ENCOUNTER — Telehealth: Payer: Self-pay | Admitting: Neurology

## 2015-08-31 NOTE — Telephone Encounter (Signed)
Dr. Clent Jacks has seen the patient, no evidence of retinal pathology on Gilenya. Evaluation was done 08/23/2015.

## 2015-10-12 DIAGNOSIS — R69 Illness, unspecified: Secondary | ICD-10-CM | POA: Diagnosis not present

## 2015-10-12 DIAGNOSIS — E785 Hyperlipidemia, unspecified: Secondary | ICD-10-CM | POA: Diagnosis not present

## 2015-10-12 DIAGNOSIS — E538 Deficiency of other specified B group vitamins: Secondary | ICD-10-CM | POA: Diagnosis not present

## 2015-10-12 DIAGNOSIS — D709 Neutropenia, unspecified: Secondary | ICD-10-CM | POA: Diagnosis not present

## 2015-10-12 DIAGNOSIS — I10 Essential (primary) hypertension: Secondary | ICD-10-CM | POA: Diagnosis not present

## 2015-10-12 DIAGNOSIS — Z0000 Encounter for general adult medical examination without abnormal findings: Secondary | ICD-10-CM | POA: Diagnosis not present

## 2015-10-12 DIAGNOSIS — L299 Pruritus, unspecified: Secondary | ICD-10-CM | POA: Diagnosis not present

## 2015-10-12 DIAGNOSIS — Z79899 Other long term (current) drug therapy: Secondary | ICD-10-CM | POA: Diagnosis not present

## 2015-10-12 DIAGNOSIS — G35 Multiple sclerosis: Secondary | ICD-10-CM | POA: Diagnosis not present

## 2015-10-12 DIAGNOSIS — E559 Vitamin D deficiency, unspecified: Secondary | ICD-10-CM | POA: Diagnosis not present

## 2015-11-01 ENCOUNTER — Other Ambulatory Visit: Payer: Self-pay | Admitting: Neurology

## 2015-11-06 ENCOUNTER — Telehealth: Payer: Self-pay | Admitting: Neurology

## 2015-11-06 NOTE — Telephone Encounter (Signed)
Called and spoke to pt. Reports that she is feeling some better this afternoon but is going out of town on Friday, driving to [place] and then on a 7 day cruise. Says that her balance has been off and she's had more dizziness/weakness over the past few days. Work-in appt scheduled for tomorrow morning.

## 2015-11-06 NOTE — Telephone Encounter (Signed)
Patient called states had episode yesterday, "dizziness, gait is off, states has been feeling weak recently, little finger on right hand lost sensation, like when a bee stings and you touch it, this morning feels better, head still doesn't feel right, slightly unbalanced, when closes eyes, feels woozy".

## 2015-11-06 NOTE — Telephone Encounter (Signed)
Pt called said she will be leaving around 5 and can be reached at [phone]

## 2015-11-07 ENCOUNTER — Encounter: Payer: Self-pay | Admitting: Neurology

## 2015-11-07 ENCOUNTER — Ambulatory Visit (INDEPENDENT_AMBULATORY_CARE_PROVIDER_SITE_OTHER): Payer: Medicare HMO | Admitting: Neurology

## 2015-11-07 ENCOUNTER — Telehealth: Payer: Self-pay | Admitting: Neurology

## 2015-11-07 VITALS — BP 141/84 | HR 83 | Ht 65.5 in | Wt 136.5 lb

## 2015-11-07 DIAGNOSIS — R42 Dizziness and giddiness: Secondary | ICD-10-CM

## 2015-11-07 DIAGNOSIS — R413 Other amnesia: Secondary | ICD-10-CM

## 2015-11-07 DIAGNOSIS — R269 Unspecified abnormalities of gait and mobility: Secondary | ICD-10-CM

## 2015-11-07 DIAGNOSIS — R5382 Chronic fatigue, unspecified: Secondary | ICD-10-CM | POA: Diagnosis not present

## 2015-11-07 DIAGNOSIS — G35 Multiple sclerosis: Secondary | ICD-10-CM | POA: Diagnosis not present

## 2015-11-07 DIAGNOSIS — Z5181 Encounter for therapeutic drug level monitoring: Secondary | ICD-10-CM | POA: Diagnosis not present

## 2015-11-07 DIAGNOSIS — E538 Deficiency of other specified B group vitamins: Secondary | ICD-10-CM | POA: Diagnosis not present

## 2015-11-07 MED ORDER — PREDNISONE 10 MG PO TABS: ORAL_TABLET | ORAL | 0 refills | Status: DC

## 2015-11-07 NOTE — Progress Notes (Signed)
Reason for visit: Multiple sclerosis  [name] is an 74 y.o. female  History of present illness:  [name] is a 75 year old left-handed white female with a history multiple sclerosis associated with a mild memory disorder. The patient has felt poorly over the last month, she has had an increase in generalized fatigue, and a generalized sense of weakness. Within the last week, she has had some increased problems with dizziness at times associated with vertigo. The patient has gait instability that has developed, and some slight numbness of the right fifth finger that may come and go. The patient has not had any falls, but she may stagger a bit. In the past, the patient has had what sounds like positional vertigo tending to have some dizziness when she rolls to the right side. This was increased within the last week, but over the last several days the dizziness has improved some. The patient clearly is not functioning normally, she remains on her Gilenya. She denies any hearing changes, ear pain, ringing in the ears. She has not had any double vision or loss of vision. She denies any changes in bowel or bladder function. The patient comes to the office for an evaluation.  Past Medical History:  Diagnosis Date  . Arthritis, degenerative    both knees  . B12 deficiency 07/06/2014  . Colitis   . Diverticulosis   . Duodenitis   . Fatty liver   . Gastritis   . Helicobacter pylori gastritis   . Hyperlipidemia   . Hypertension   . IBD (inflammatory bowel disease)   . Memory difficulty 01/05/2013  . Memory loss    mild  . Multiple sclerosis ([place])   . Osteopenia   . Vitamin B12 deficiency     Past Surgical History:  Procedure Laterality Date  . ABDOMINAL HYSTERECTOMY  2010  . BLADDER SURGERY  2010   Bladder Tack  . CERVICAL SPINE SURGERY    . CHOLECYSTECTOMY    . HEMORRHOID SURGERY    . ROTATOR CUFF REPAIR    . SPINAL FUSION    . TUBAL LIGATION      Family History    Problem Relation Age of Onset  . Diabetes Father   . Heart disease Father   . Breast cancer Cousin   . Colon cancer Maternal Grandmother     ? may be duodenal cancer  . Colon polyps Mother   . Colon polyps Brother   . Hypertension Brother     Social history:  reports that she quit smoking about 18 years ago. She has never used smokeless tobacco. She reports that she does not drink alcohol or use drugs.    Allergies  Allergen Reactions  . Vytorin [Ezetimibe-Simvastatin]     White skin patch  . Doxycycline     Raw mouth   . Erythromycin     REACTION: weakness  . Macrobid [Nitrofurantoin Macrocrystal] Hives  . Morphine Nausea And Vomiting  . Sulfonamide Derivatives Hives and Nausea And Vomiting  . Amoxicillin Hives    Medications:  Prior to Admission medications   Medication Sig Start Date End Date Taking? Authorizing Provider  B Complex Vitamins (VITAMIN B COMPLEX PO) Take 1 tablet by mouth daily.     Historical Provider, MD  Calcium Carb-Cholecalciferol (CALCIUM 1000 + D PO) Take by mouth daily.    Historical Provider, MD  carboxymethylcellulose 1 % ophthalmic solution Apply 1 drop to eye daily.    Historical Provider, MD  Cholecalciferol (  VITAMIN D-3) 5000 UNITS TABS Take 5,000 Units by mouth daily.    Historical Provider, MD  desonide (DESOWEN) 0.05 % lotion Apply 1 application topically daily.    Historical Provider, MD  Evening Primrose Oil 500 MG CAPS Take 1 capsule by mouth daily.    Historical Provider, MD  fenofibrate (TRICOR) 145 MG tablet Take 145 mg by mouth daily.    Historical Provider, MD  fexofenadine (ALLEGRA) 180 MG tablet Take 180 mg by mouth daily.    Historical Provider, MD  gabapentin (NEURONTIN) 600 MG tablet Take 1 tablet (600 mg total) by mouth 3 (three) times daily. 07/04/15   Kathrynn Ducking, MD  GILENYA 0.5 MG CAPS TAKE ONE CAPSULE BY MOUTH DAILY 11/01/15   Kathrynn Ducking, MD  Glucosamine 500 MG TABS Take 1 tablet by mouth 2 (two) times daily.      Historical Provider, MD  Lidocaine, Anorectal, (RECTICARE) 5 % CREA Use topically twice daily Patient not taking: Reported on 06/28/2015 09/08/14   Lafayette Dragon, MD  lovastatin (MEVACOR) 40 MG tablet Take 40 mg by mouth 2 (two) times daily.      Historical Provider, MD  memantine (NAMENDA XR) 28 MG CP24 24 hr capsule Take 1 capsule (28 mg total) by mouth daily. 07/04/15   Kathrynn Ducking, MD  Multiple Vitamin (MULTIVITAMIN) tablet Take 0.5 tablets by mouth daily.     Historical Provider, MD  Omega-3 Fatty Acids (FISH OIL) 1200 MG CAPS Take 1 capsule by mouth daily.     Historical Provider, MD  Probiotic Product (PROBIOTIC FORMULA PO) Take 1 tablet by mouth daily.      Historical Provider, MD  vitamin B-12 (CYANOCOBALAMIN) 1000 MCG tablet Take 1,000 mcg by mouth daily.      Historical Provider, MD    ROS:  Out of a complete 14 system review of symptoms, the patient complains only of the following symptoms, and all other reviewed systems are negative.  Dizziness Weakness, gait disturbance  There were no vitals taken for this visit.  Physical Exam  General: The patient is alert and cooperative at the time of the examination.  Ears: Tympanic membranes are clear bilaterally.  Skin: No significant peripheral edema is noted.   Neurologic Exam  Mental status: The patient is alert and oriented x 3 at the time of the examination. The patient has apparent normal recent and remote memory, with an apparently normal attention span and concentration ability. Mini-Mental Status Examination done today shows a total score of 30/30. The patient is able to name 18 animals in 30 seconds.   Cranial nerves: Facial symmetry is present. Speech is normal, no aphasia or dysarthria is noted. Extraocular movements are full. Visual fields are full. Pupils are equal, round, and reactive to light. Discs are flat bilaterally.  Motor: The patient has good strength in all 4 extremities.  Sensory examination: Soft  touch sensation is symmetric on the face, arms, and legs.  Coordination: The patient has good finger-nose-finger and heel-to-shin bilaterally.  Gait and station: The patient has a slightly wide-based, unsteady gait. Tandem gait is unsteady. Romberg is negative.  Reflexes: Deep tendon reflexes are symmetric.   MRI brain 07/21/14:  IMPRESSION: Abnormal MRI scan of the brain showing scattered periventricular, subcortical white matter hyperintensities compatible with multiple sclerosis. No enhancing lesions are noted. There is mild degree of generalized cerebral atrophy. Overall no significant change compared with prior MRI scan dated 12/27/2009  * MRI scan images were reviewed online. I  agree with the written report.    Assessment/Plan:  1. Multiple sclerosis  2. Mild memory disturbance  3. Gait instability  The patient has developed subjective sensations of weakness and fatigue, and more recently she has had some increase in dizziness, associated with a gait disturbance. A mild MS exacerbation is considered. The patient will be set up for MRI evaluation of the brain with and without gadolinium enhancement. Blood work will be done today. A prednisone Dosepak was given in case the patient has a significant exacerbation in her ability to walk. The patient will be going on a cruise to the [place] over the next week or 10 days. She will follow-up for her next scheduled appointment.  Jill Alexanders MD 11/07/2015 7:37 AM  [facility] [address] [place], [place] [address]  Phone [phone] Fax [phone]

## 2015-11-08 LAB — COMPREHENSIVE METABOLIC PANEL
A/G RATIO: 1.5 (ref 1.2–2.2)
ALBUMIN: 4.5 g/dL (ref 3.5–4.8)
ALT: 16 IU/L (ref 0–32)
AST: 31 IU/L (ref 0–40)
Alkaline Phosphatase: 67 IU/L (ref 39–117)
BUN / CREAT RATIO: 22 (ref 12–28)
BUN: 18 mg/dL (ref 8–27)
Bilirubin Total: 0.3 mg/dL (ref 0.0–1.2)
CALCIUM: 10.1 mg/dL (ref 8.7–10.3)
CO2: 25 mmol/L (ref 18–29)
CREATININE: 0.81 mg/dL (ref 0.57–1.00)
Chloride: 102 mmol/L (ref 96–106)
GFR, EST AFRICAN AMERICAN: 83 mL/min/{1.73_m2} (ref >59)
GFR, EST NON AFRICAN AMERICAN: 72 mL/min/{1.73_m2} (ref >59)
GLOBULIN, TOTAL: 3.1 g/dL (ref 1.5–4.5)
Glucose: 134 mg/dL — ABNORMAL HIGH (ref 65–99)
POTASSIUM: 5.5 mmol/L — AB (ref 3.5–5.2)
SODIUM: 142 mmol/L (ref 134–144)
Total Protein: 7.6 g/dL (ref 6.0–8.5)

## 2015-11-08 LAB — TSH: TSH: 0.914 u[IU]/mL (ref 0.450–4.500)

## 2015-11-08 LAB — CBC WITH DIFFERENTIAL/PLATELET
BASOS: 1 %
Basophils Absolute: 0 10*3/uL (ref 0.0–0.2)
EOS (ABSOLUTE): 0.2 10*3/uL (ref 0.0–0.4)
EOS: 6 %
Hematocrit: 41.9 % (ref 34.0–46.6)
Hemoglobin: 13.2 g/dL (ref 11.1–15.9)
Immature Grans (Abs): 0 10*3/uL (ref 0.0–0.1)
Immature Granulocytes: 1 %
LYMPHS ABS: 0.3 10*3/uL — AB (ref 0.7–3.1)
Lymphs: 7 %
MCH: 30.2 pg (ref 26.6–33.0)
MCHC: 31.5 g/dL (ref 31.5–35.7)
MCV: 96 fL (ref 79–97)
MONOS ABS: 0.4 10*3/uL (ref 0.1–0.9)
Monocytes: 11 %
NEUTROS ABS: 2.9 10*3/uL (ref 1.4–7.0)
Neutrophils: 74 %
PLATELETS: 241 10*3/uL (ref 150–379)
RBC: 4.37 x10E6/uL (ref 3.77–5.28)
RDW: 13.6 % (ref 12.3–15.4)
WBC: 3.8 10*3/uL (ref 3.4–10.8)

## 2015-11-08 LAB — VITAMIN B12: Vitamin B-12: 539 pg/mL (ref 211–946)

## 2015-11-19 ENCOUNTER — Ambulatory Visit
Admission: RE | Admit: 2015-11-19 | Discharge: 2015-11-19 | Disposition: A | Discharge status: Home / Self Care | Payer: Medicare HMO | Source: Ambulatory Visit | Attending: Neurology | Admitting: Neurology

## 2015-11-19 ENCOUNTER — Telehealth: Payer: Self-pay | Admitting: Neurology

## 2015-11-19 DIAGNOSIS — G35 Multiple sclerosis: Secondary | ICD-10-CM | POA: Diagnosis not present

## 2015-11-19 MED ORDER — GADOBENATE DIMEGLUMINE 529 MG/ML IV SOLN
12.0000 mL | Freq: Once | INTRAVENOUS | Status: AC | PRN
  Administered 2015-11-19: 12 mL via INTRAVENOUS

## 2015-11-19 NOTE — Telephone Encounter (Signed)
I called the patient. MRI the brain shows no change from April 2016, no evidence of new MS lesions.    MRI brain 11/19/15:  IMPRESSION:  This MRI of the brain with and without contrast shows the following: 1.    Scattered T2/FLAIR hyperintense foci in the subcortical, deep and periventricular white matter. The pattern is nonspecific and could be seen with chronic microvascular ischemic change or demyelination. None of the foci appeared to be acute. 2.    There are no acute findings.  3.    Compared to the MRI dated 07/20/2014, there is no interval change.

## 2015-12-05 DIAGNOSIS — E785 Hyperlipidemia, unspecified: Secondary | ICD-10-CM | POA: Diagnosis not present

## 2015-12-05 DIAGNOSIS — Z0000 Encounter for general adult medical examination without abnormal findings: Secondary | ICD-10-CM | POA: Diagnosis not present

## 2015-12-05 DIAGNOSIS — K5010 Crohn's disease of large intestine without complications: Secondary | ICD-10-CM | POA: Diagnosis not present

## 2015-12-05 DIAGNOSIS — G35 Multiple sclerosis: Secondary | ICD-10-CM | POA: Diagnosis not present

## 2015-12-13 DIAGNOSIS — R69 Illness, unspecified: Secondary | ICD-10-CM | POA: Diagnosis not present

## 2015-12-28 ENCOUNTER — Encounter: Payer: Self-pay | Admitting: Neurology

## 2015-12-28 ENCOUNTER — Ambulatory Visit (INDEPENDENT_AMBULATORY_CARE_PROVIDER_SITE_OTHER): Payer: Medicare HMO | Admitting: Neurology

## 2015-12-28 VITALS — BP 142/84 | HR 76 | Ht 65.5 in | Wt 138.0 lb

## 2015-12-28 DIAGNOSIS — R300 Dysuria: Secondary | ICD-10-CM | POA: Diagnosis not present

## 2015-12-28 DIAGNOSIS — R42 Dizziness and giddiness: Secondary | ICD-10-CM | POA: Diagnosis not present

## 2015-12-28 DIAGNOSIS — R413 Other amnesia: Secondary | ICD-10-CM | POA: Diagnosis not present

## 2015-12-28 DIAGNOSIS — G35 Multiple sclerosis: Secondary | ICD-10-CM

## 2015-12-28 DIAGNOSIS — R269 Unspecified abnormalities of gait and mobility: Secondary | ICD-10-CM | POA: Diagnosis not present

## 2015-12-28 NOTE — Progress Notes (Signed)
Reason for visit: Multiple sclerosis  [name] is an 75 y.o. female  History of present illness:  [name] is a 75 year old left-handed white female with a history multiple sclerosis, currently treated with Gilenya. The patient has been tolerating the medication fairly well. Two months ago, she began having some increased fatigue and difficulty with balance. The patient was given a prednisone Dosepak to take if the symptoms worsen, but she never had to take it. The fatigue has improved, but she continues to have some issues with episodic vertigo that seems to be positional. The patient will note vertigo when she looks to the right or if she looks up. If she stays still, the vertigo goes away rapidly, sometimes associated with some nausea. The patient has some gait instability associated with the dizziness, but no falls. She continues her yoga sessions. Recently, she has noted some sensations of a urinary tract infection. She does have occasional fecal and urinary incontinence. MRI of the brain was done recently, showed no change from the year prior.  Past Medical History:  Diagnosis Date  . Arthritis, degenerative    both knees  . B12 deficiency 07/06/2014  . Colitis   . Diverticulosis   . Duodenitis   . Fatty liver   . Gastritis   . Helicobacter pylori gastritis   . Hyperlipidemia   . Hypertension   . IBD (inflammatory bowel disease)   . Memory difficulty 01/05/2013  . Memory loss    mild  . Multiple sclerosis ([place])   . Osteopenia   . Vitamin B12 deficiency     Past Surgical History:  Procedure Laterality Date  . ABDOMINAL HYSTERECTOMY  2010  . BLADDER SURGERY  2010   Bladder Tack  . CERVICAL SPINE SURGERY    . CHOLECYSTECTOMY    . HEMORRHOID SURGERY    . ROTATOR CUFF REPAIR    . SPINAL FUSION    . TUBAL LIGATION      Family History  Problem Relation Age of Onset  . Diabetes Father   . Heart disease Father   . Colon polyps Mother   . Breast cancer Cousin    . Colon cancer Maternal Grandmother     ? may be duodenal cancer  . Colon polyps Brother   . Hypertension Brother     Social history:  reports that she quit smoking about 18 years ago. She has never used smokeless tobacco. She reports that she does not drink alcohol or use drugs.    Allergies  Allergen Reactions  . Vytorin [Ezetimibe-Simvastatin]     White skin patch  . Doxycycline     Raw mouth   . Erythromycin     REACTION: weakness  . Macrobid [Nitrofurantoin Macrocrystal] Hives  . Morphine Nausea And Vomiting  . Sulfonamide Derivatives Hives and Nausea And Vomiting  . Amoxicillin Hives    Medications:  Prior to Admission medications   Medication Sig Start Date End Date Taking? Authorizing Provider  B Complex Vitamins (VITAMIN B COMPLEX PO) Take 1 tablet by mouth daily.    Yes Historical Provider, MD  Calcium Carb-Cholecalciferol (CALCIUM 1000 + D PO) Take by mouth daily.   Yes Historical Provider, MD  carboxymethylcellulose 1 % ophthalmic solution Apply 1 drop to eye daily.   Yes Historical Provider, MD  Cholecalciferol (VITAMIN D-3) 5000 UNITS TABS Take 5,000 Units by mouth daily.   Yes Historical Provider, MD  desonide (DESOWEN) 0.05 % lotion Apply 1 application topically daily.  Yes Historical Provider, MD  Evening Primrose Oil 500 MG CAPS Take 1 capsule by mouth daily.   Yes Historical Provider, MD  fenofibrate (TRICOR) 145 MG tablet Take 145 mg by mouth daily.   Yes Historical Provider, MD  fexofenadine (ALLEGRA) 180 MG tablet Take 180 mg by mouth daily.   Yes Historical Provider, MD  gabapentin (NEURONTIN) 600 MG tablet Take 1 tablet (600 mg total) by mouth 3 (three) times daily. 07/04/15  Yes Kathrynn Ducking, MD  GILENYA 0.5 MG CAPS TAKE ONE CAPSULE BY MOUTH DAILY 11/01/15  Yes Kathrynn Ducking, MD  Glucosamine 500 MG TABS Take 1 tablet by mouth 2 (two) times daily.    Yes Historical Provider, MD  lovastatin (MEVACOR) 40 MG tablet Take 40 mg by mouth 2 (two) times  daily.     Yes Historical Provider, MD  memantine (NAMENDA XR) 28 MG CP24 24 hr capsule Take 1 capsule (28 mg total) by mouth daily. 07/04/15  Yes Kathrynn Ducking, MD  Multiple Vitamin (MULTIVITAMIN) tablet Take 0.5 tablets by mouth daily.    Yes Historical Provider, MD  Omega-3 Fatty Acids (FISH OIL) 1200 MG CAPS Take 1 capsule by mouth daily.    Yes Historical Provider, MD  Probiotic Product (PROBIOTIC FORMULA PO) Take 1 tablet by mouth daily.     Yes Historical Provider, MD  vitamin B-12 (CYANOCOBALAMIN) 1000 MCG tablet Take 1,000 mcg by mouth daily.     Yes Historical Provider, MD  predniSONE (DELTASONE) 10 MG tablet Begin taking 6 tablets daily, taper by one tablet every other day until off the medication. Patient not taking: Reported on 12/28/2015 11/07/15   Kathrynn Ducking, MD    ROS:  Out of a complete 14 system review of symptoms, the patient complains only of the following symptoms, and all other reviewed systems are negative.  Light sensitivity Hearing loss Heat intolerance Diarrhea Environmental allergies Dizziness, numbness  Height 5' 5.5" (1.664 m), weight 138 lb (62.6 kg).  Physical Exam  General: The patient is alert and cooperative at the time of the examination.  Eyes: Pupils are equal, round, and reactive to light. Discs are flat bilaterally.  Ears: Tympanic membranes are clear bilaterally  Skin: No significant peripheral edema is noted.   Neurologic Exam  Mental status: The patient is alert and oriented x 3 at the time of the examination. The patient has apparent normal recent and remote memory, with an apparently normal attention span and concentration ability. Mini-Mental Status Examination done today shows a total score 29/30.   Cranial nerves: Facial symmetry is present. Speech is normal, no aphasia or dysarthria is noted. Extraocular movements are full. Visual fields are full.  Motor: The patient has good strength in all 4 extremities.  Sensory  examination: Soft touch sensation is symmetric on the face, arms, and legs.  Coordination: The patient has good finger-nose-finger and heel-to-shin bilaterally. The Nyan-Barrany procedure was done today, when turning the head to the right, there was some horizontal and rotatory nystagmus when looking to the left that correlated with symptoms.  Gait and station: The patient has a normal gait. Tandem gait is slightly unsteady. Romberg is negative, but is slightly unsteady. No drift is seen.  Reflexes: Deep tendon reflexes are symmetric.   MRI brain 11/19/15:  IMPRESSION: This MRI of the brain with and without contrast shows the following: 1. Scattered T2/FLAIR hyperintense foci in the subcortical, deep and periventricular white matter. The pattern is nonspecific and could be seen with  chronic microvascular ischemic change or demyelination. None of the foci appeared to be acute. 2. There are no acute findings.  3. Compared to the MRI dated 07/20/2014, there is no interval change.  * MRI scan images were reviewed online. I agree with the written report.    Assessment/Plan:  1. Multiple sclerosis  2. Positional vertigo   3. Mild gait disturbance  4. Mild memory disturbance  The patient will be sent to physical therapy for vestibular rehabilitation. The patient will follow-up through this office in 6 months. Given the recent symptoms of dysuria, a urinalysis will be done today.  Jill Alexanders MD 12/28/2015 8:05 AM  [facility] [address] [place], [place] [address]  Phone [phone] Fax [phone]

## 2015-12-29 ENCOUNTER — Telehealth: Payer: Self-pay | Admitting: Neurology

## 2015-12-29 LAB — URINALYSIS, ROUTINE W REFLEX MICROSCOPIC
Bilirubin, UA: NEGATIVE
GLUCOSE, UA: NEGATIVE
Ketones, UA: NEGATIVE
Nitrite, UA: NEGATIVE
Specific Gravity, UA: 1.018 (ref 1.005–1.030)
Urobilinogen, Ur: 0.2 mg/dL (ref 0.2–1.0)
pH, UA: 6 (ref 5.0–7.5)

## 2015-12-29 LAB — MICROSCOPIC EXAMINATION
Casts: NONE SEEN /lpf
EPITHELIAL CELLS (NON RENAL): NONE SEEN /HPF (ref 0–10)
RBC, UA: >30 /hpf — AB (ref 0–?)
WBC, UA: >30 /hpf — AB (ref 0–?)

## 2015-12-29 MED ORDER — CIPROFLOXACIN HCL 250 MG PO TABS: 250.0000 mg | ORAL_TABLET | Freq: Two times a day (BID) | ORAL | 0 refills | Status: DC

## 2015-12-29 NOTE — Telephone Encounter (Signed)
Urinalysis shows a UTI. I will call in Cipro for this patient. I discussed this with her.

## 2016-01-17 ENCOUNTER — Ambulatory Visit: Payer: Medicare HMO | Attending: Neurology

## 2016-01-17 DIAGNOSIS — R2689 Other abnormalities of gait and mobility: Secondary | ICD-10-CM

## 2016-01-17 DIAGNOSIS — R42 Dizziness and giddiness: Secondary | ICD-10-CM | POA: Diagnosis not present

## 2016-01-17 DIAGNOSIS — H8113 Benign paroxysmal vertigo, bilateral: Secondary | ICD-10-CM | POA: Diagnosis not present

## 2016-01-17 NOTE — Therapy (Signed)
[facility] [address] [place], [place], [address] Phone: [phone]   Fax:  [phone]  Physical Therapy Evaluation  Patient Details  Name: [name] MRN: [id number] Date of Birth: 1940/07/06 Referring Provider: Dr. Jannifer Franklin  Encounter Date: 01/17/2016      PT End of Session - 01/17/16 1332    Visit Number 1   Number of Visits 5   Date for PT Re-Evaluation 02/16/16   Authorization Type G-CODE EVERY 10TH VISIT.    PT Start Time 0804   PT Stop Time 0845   PT Time Calculation (min) 41 min   Activity Tolerance Patient tolerated treatment well   Behavior During Therapy [facility] for tasks assessed/performed      Past Medical History:  Diagnosis Date  . Arthritis, degenerative    both knees  . B12 deficiency 07/06/2014  . Colitis   . Diverticulosis   . Duodenitis   . Fatty liver   . Gastritis   . Helicobacter pylori gastritis   . Hyperlipidemia   . Hypertension   . IBD (inflammatory bowel disease)   . Memory difficulty 01/05/2013  . Memory loss    mild  . Multiple sclerosis ([place])   . Osteopenia   . Vitamin B12 deficiency     Past Surgical History:  Procedure Laterality Date  . ABDOMINAL HYSTERECTOMY  2010  . BLADDER SURGERY  2010   Bladder Tack  . CERVICAL SPINE SURGERY    . CHOLECYSTECTOMY    . HEMORRHOID SURGERY    . ROTATOR CUFF REPAIR    . SPINAL FUSION    . TUBAL LIGATION      There were no vitals filed for this visit.       Subjective Assessment - 01/17/16 0809    Subjective Pt reported dizziness started in July. Pt stated dizziness was very strong when it began and she had difficulty walking, along with nausea. Pt reported dizziness has slowly improved. pt reports she has difficulty getting back to her car, after yoga. Pt describes dizziness as a spinning a sensation. pt reports dizziness lasts less than 1 minute, and then feels unsteady/woozy afterwards. Looking up, rolling over in bed  makes dizziness worse. Dizziness at worst: 8/10, at best: 0/10.    Pertinent History MS, colitis, HTN, arthritis   Patient Stated Goals To make dizziness go away.    Currently in Pain? No/denies            [facility] PT Assessment - 01/17/16 0813      Assessment   Medical Diagnosis Dizziness and giddiness   Referring Provider Dr. Jannifer Franklin   Onset Date/Surgical Date 11/09/15   Hand Dominance Left   Prior Therapy none for dizziness     Precautions   Precautions Fall     Restrictions   Weight Bearing Restrictions No     Balance Screen   Has the patient fallen in the past 6 months Yes   How many times? 1  looking up in the closet and turned to the right   Has the patient had a decrease in activity level because of a fear of falling?  Yes   Is the patient reluctant to leave their home because of a fear of falling?  No     Home Environment   Living Environment Private residence   Living Arrangements Spouse/significant other   Available Help at Discharge Family   Type of [place] Access Level entry   Home  Layout Two level;Able to live on main level with bedroom/bathroom   Alternate Level Stairs-Number of Steps 12   Alternate Level Stairs-Rails Right   Home Equipment None     Prior Function   Level of Independence Independent   Vocation Part time employment   Vocation Requirements clerical work   Leisure yoga     Cognition   Overall Cognitive Status History of cognitive impairments - at baseline  per pt's chart     Sensation   Light Touch Appears Intact   Additional Comments Pt reports intermittent N/T in hands and feet.      Ambulation/Gait   Ambulation/Gait Yes   Ambulation/Gait Assistance 7: Independent   Ambulation Distance (Feet) 100 Feet   Assistive device None   Gait Pattern Within Functional Limits;Step-through pattern   Ambulation Surface Level;Indoor   Gait velocity 3.64f/sec. without an AD.            Vestibular Assessment - 01/17/16 0821       Symptom Behavior   Type of Dizziness Spinning   Frequency of Dizziness Daily   Duration of Dizziness Less than one minute   Aggravating Factors Looking up to the ceiling  rolling in bed     Occulomotor Exam   Occulomotor Alignment Normal   Spontaneous Absent   Gaze-induced Absent   Head shaking Horizontal Absent   Smooth Pursuits Intact   Saccades Intact   Comment Pt reported slight wooziness/nausea after occulomotor exam.     Vestibulo-Occular Reflex   VOR 1 Head Only (x 1 viewing) WNL but pt reported slight dizziness during vertical VOR     Positional Testing   Dix-Hallpike Dix-Hallpike Left;Dix-Hallpike Right   Horizontal Canal Testing Horizontal Canal Right;Horizontal Canal Left     Dix-Hallpike Right   Dix-Hallpike Right Duration Less than 15 sec. with delayed onset (~10 sec.). 4/10 dizziness.   Dix-Hallpike Right Symptoms Upbeat, right rotatory nystagmus     Dix-Hallpike Left   Dix-Hallpike Left Duration none   Dix-Hallpike Left Symptoms No nystagmus     Horizontal Canal Right   Horizontal Canal Right Duration none   Horizontal Canal Right Symptoms Normal     Horizontal Canal Left   Horizontal Canal Left Duration Pt reported slight dizziness   Horizontal Canal Left Symptoms Normal                Vestibular Treatment/Exercise - 01/17/16 1331      Vestibular Treatment/Exercise   Vestibular Treatment Provided Canalith Repositioning   Canalith Repositioning Epley Manuever Right      EPLEY MANUEVER RIGHT   Number of Reps  1   Overall Response Symptoms Resolved   Response Details  No nystagmus or dizziness noted after treatment.                PT Education - 01/17/16 1332    Education provided Yes   Education Details PT discussed frequency/duration. PT edcuated pt on BPPV.   Person(s) Educated Patient;Spouse   Methods Explanation   Comprehension Verbalized understanding          PT Short Term Goals - 01/17/16 1337      PT SHORT  TERM GOAL #1   Title same as LTGs.            PT Long Term Goals - 01/17/16 1337      PT LONG TERM GOAL #1   Title Pt will report 0/10 dizziness during positional testing to improve quality of life. TARGET DATE FOR ALL  LTGS: 02/14/16   Status New     PT LONG TERM GOAL #2   Title Perform FGA and write goal prn.   Status New     PT LONG TERM GOAL #3   Title Pt will amb. 1000' over even/uneven terrain, while performing head turns, with dizziness not incr. > 2 points to improve functional mobility.    Status New     PT LONG TERM GOAL #4   Title Pt will be able to perform yoga without an incr. in dizziness to improve quality of life.    Status New               Plan - 01/17/16 1333    Clinical Impression Statement Pt is a pleasant 75y/o female presenting to OPPT neuro with dizziness. Pt's PMH significant for the following: MS, colitis, HTN, arthritis. Pt presented with the following impairments during the exam: dizziness, guarded amb. 2/2 dizziness, and impaired balance. Pt's symptoms and upbeating torsional nystagmus during R Dix-Hallpike consistent with R pBPPV. Pt's symptoms and nystagmus resolved after one treatment. 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 PT will formally assess balance next session, as FGA not performed 2/2 time constraints.    Rehab Potential Good   Clinical Impairments Affecting Rehab Potential MS and chart states pt has mild cognitive deficits.    PT Frequency 1x / week   PT Duration 4 weeks   PT Treatment/Interventions ADLs/Self Care Home Management;Biofeedback;Canalith Repostioning;Neuromuscular re-education;Therapeutic exercise;Therapeutic activities;Balance training;Functional mobility training;Stair training;Gait training;DME Instruction;Patient/family education;Orthotic Fit/Training;Vestibular;Manual techniques   PT Next Visit Plan Re-assess for R pBPPV and treat prn. Perform FGA and write goal prn.    Consulted and Agree with  Plan of Care Patient      Patient will benefit from skilled therapeutic intervention in order to improve the following deficits and impairments:  Dizziness, Decreased balance, Abnormal gait  Visit Diagnosis: BPPV (benign paroxysmal positional vertigo), bilateral - Plan: PT plan of care cert/re-cert  Dizziness and giddiness - Plan: PT plan of care cert/re-cert  Other abnormalities of gait and mobility - Plan: PT plan of care cert/re-cert      G-Codes - 77/93/90 1339    Functional Assessment Tool Used 4/10 dizziness during R Dix-Hallpike and 8/10 dizziness worst (lookin up and rolling to R in bed).   Functional Limitation Changing and maintaining body position   Changing and Maintaining Body Position Current Status [phone]) At least 40 percent but less than 60 percent impaired, limited or restricted   Changing and Maintaining Body Position Goal Status ([id number]) At least 1 percent but less than 20 percent impaired, limited or restricted       Problem List Patient Active Problem List   Diagnosis Date Noted  . Abnormality of gait 11/07/2015  . Dizziness and giddiness 11/07/2015  . Mild cognitive impairment 01/09/2015  . B12 deficiency 07/06/2014  . Leukopenia 05/06/2013  . Memory difficulty 01/05/2013  . OTHER SYMPTOMS INVOLVING HEAD AND NECK 12/04/2008  . HYPERLIPIDEMIA 11/28/2008  . MULTIPLE SCLEROSIS 11/28/2008  . INFLAMMATORY BOWEL DISEASE 11/28/2008  . DIVERTICULOSIS, COLON 11/28/2008  . FATTY LIVER DISEASE 11/28/2008  . PERSONAL HISTORY OF PEPTIC ULCER DISEASE 11/28/2008    Dalia Jollie L 01/17/2016, 1:40 PM  [facility] [address] [place], [place], [address] Phone: [phone]   Fax:  [phone]  Name: [name] MRN: [id number] Date of Birth: 09/15/1940  Geoffry Paradise, PT,DPT 01/17/16 1:41 PM Phone: [phone] Fax: [phone]

## 2016-01-18 DIAGNOSIS — M1612 Unilateral primary osteoarthritis, left hip: Secondary | ICD-10-CM | POA: Diagnosis not present

## 2016-01-18 DIAGNOSIS — M1611 Unilateral primary osteoarthritis, right hip: Secondary | ICD-10-CM | POA: Diagnosis not present

## 2016-01-23 DIAGNOSIS — R69 Illness, unspecified: Secondary | ICD-10-CM | POA: Diagnosis not present

## 2016-02-04 ENCOUNTER — Ambulatory Visit: Payer: Medicare HMO | Admitting: Physical Therapy

## 2016-02-04 DIAGNOSIS — H8113 Benign paroxysmal vertigo, bilateral: Secondary | ICD-10-CM | POA: Diagnosis not present

## 2016-02-04 NOTE — Therapy (Signed)
[facility] [address] [place], [place], [address] Phone: [phone]   Fax:  [phone]  Physical Therapy Treatment  Patient Details  Name: [name] MRN: [id number] Date of Birth: [birth year] Referring Provider: Dr. Jannifer Franklin  Encounter Date: 02/04/2016      PT End of Session - 02/04/16 0832    Visit Number 2   Number of Visits 5   Date for PT Re-Evaluation 02/16/16   PT Start Time 0801   PT Stop Time 0832  treatment ended early due to symptoms resolved - no additional rx warranted   PT Time Calculation (min) 31 min      Past Medical History:  Diagnosis Date  . Arthritis, degenerative    both knees  . B12 deficiency 07/06/2014  . Colitis   . Diverticulosis   . Duodenitis   . Fatty liver   . Gastritis   . Helicobacter pylori gastritis   . Hyperlipidemia   . Hypertension   . IBD (inflammatory bowel disease)   . Memory difficulty 01/05/2013  . Memory loss    mild  . Multiple sclerosis ([place])   . Osteopenia   . Vitamin B12 deficiency     Past Surgical History:  Procedure Laterality Date  . ABDOMINAL HYSTERECTOMY  2010  . BLADDER SURGERY  2010   Bladder Tack  . CERVICAL SPINE SURGERY    . CHOLECYSTECTOMY    . HEMORRHOID SURGERY    . ROTATOR CUFF REPAIR    . SPINAL FUSION    . TUBAL LIGATION      There were no vitals filed for this visit.      Subjective Assessment - 02/04/16 1811    Currently in Pain? No/denies                          Vestibular Treatment/Exercise - 02/04/16 1155      Vestibular Treatment/Exercise   Vestibular Treatment Provided Canalith Repositioning   Canalith Repositioning Epley Manuever Right      EPLEY MANUEVER RIGHT   Number of Reps  2   Overall Response Improved Symptoms   Response Details  No nystagmus noted on either rep but pt reported some "minute dizziness" in 1st position of maneuver;  pt reported no change in this sensation on  2nd rep but states it is so minimal that it is not affecting her functionally     Neuro Re-ed; (+) R sidelying test for minimal c/o vertigo; (+) R Dix-Hallpike test with c/o vertigo but no nystagmus  noted in test position          PT Education - 02/04/16 0831    Education provided Yes   Education Details pt educated on etiology of BPPV and was given handout; instructed in Brandt-Daroff for complete resolution of symptoms   Person(s) Educated Patient   Methods Explanation;Handout   Comprehension Verbalized understanding;Returned demonstration          PT Short Term Goals - 01/17/16 1337      PT SHORT TERM GOAL #1   Title same as LTGs.            PT Long Term Goals - 01/17/16 1337      PT LONG TERM GOAL #1   Title Pt will report 0/10 dizziness during positional testing to improve quality of life. TARGET DATE FOR ALL LTGS: 02/14/16   Status New     PT LONG TERM GOAL #2  Title Perform FGA and write goal prn.   Status New     PT LONG TERM GOAL #3   Title Pt will amb. 1000' over even/uneven terrain, while performing head turns, with dizziness not incr. > 2 points to improve functional mobility.    Status New     PT LONG TERM GOAL #4   Title Pt will be able to perform yoga without an incr. in dizziness to improve quality of life.    Status New               Plan - 02/04/16 1813    Clinical Impression Statement Pt had minimal c/o vertigo in R Dix-Hallpike position with minimal rotary nystagmus on 1st rep only; pt reports she is satisfied with progress made and states that the "minute" vertigo she is now experiencing is not affecting her functionally                                                                                         Rehab Potential Good   Clinical Impairments Affecting Rehab Potential MS and chart states pt has mild cognitive deficits.    PT Frequency 1x / week   PT Duration 4 weeks   PT Treatment/Interventions ADLs/Self Care Home  Management;Biofeedback;Canalith Repostioning;Neuromuscular re-education;Therapeutic exercise;Therapeutic activities;Balance training;Functional mobility training;Stair training;Gait training;DME Instruction;Patient/family education;Orthotic Fit/Training;Vestibular;Manual techniques   PT Next Visit Plan Re-assess for R pBPPV and treat prn. Perform FGA and write goal prn.    Consulted and Agree with Plan of Care Patient      Patient will benefit from skilled therapeutic intervention in order to improve the following deficits and impairments:  Dizziness, Decreased balance, Abnormal gait  Visit Diagnosis: BPPV (benign paroxysmal positional vertigo), bilateral     Problem List Patient Active Problem List   Diagnosis Date Noted  . Abnormality of gait 11/07/2015  . Dizziness and giddiness 11/07/2015  . Mild cognitive impairment 01/09/2015  . B12 deficiency 07/06/2014  . Leukopenia 05/06/2013  . Memory difficulty 01/05/2013  . OTHER SYMPTOMS INVOLVING HEAD AND NECK 12/04/2008  . HYPERLIPIDEMIA 11/28/2008  . MULTIPLE SCLEROSIS 11/28/2008  . INFLAMMATORY BOWEL DISEASE 11/28/2008  . DIVERTICULOSIS, COLON 11/28/2008  . FATTY LIVER DISEASE 11/28/2008  . PERSONAL HISTORY OF PEPTIC ULCER DISEASE 11/28/2008     PHYSICAL THERAPY DISCHARGE SUMMARY  Visits from Start of Care: 2  Current functional level related to goals / functional outcomes: See above for progress towards goals - R BPPV almost fully resolved   Remaining deficits: Minimal c/o vertigo intermittently with certain head movements - pt reports this is not limiting her functionally  - requesting D/C   Education / Equipment: Pt has been instructed in Brandt-Daroff exercises prn for R BPPV: also educated in etiology of R BPPV - information given  Plan: Patient agrees to discharge.  Patient goals were partially met. Patient is being discharged due to the patient's request.  ?????        Pt requesting D/C due to being pleased  with progress achieved and her current functional status.  OVZCHY, IFOYD XAJOINO, PT 02/04/2016, 6:24 PM  [facility] [address]  [place], [place], [address] Phone: [phone]   Fax:  [phone]  Name: ANNALIS KACZMARCZYK MRN: [id number] Date of Birth: [birth year]

## 2016-02-04 NOTE — Patient Instructions (Signed)
Sit to Side-Lying    Sit on edge of bed. 1. Turn head 45 to right. 2. Maintain head position and lie down slowly on left side. Hold until symptoms subside. 3. Sit up slowly. Hold until symptoms subside. 4. Turn head 45 to left. 5. Maintain head position and lie down slowly on right side. Hold until symptoms subside. 6. Sit up slowly. Repeat sequence _5_ times per session. Do __3__ sessions per day.  Copyright  VHI. All rights reserved.  Benign Positional Vertigo Vertigo is the feeling that you or your surroundings are moving when they are not. Benign positional vertigo is the most common form of vertigo. The cause of this condition is not serious (is benign). This condition is triggered by certain movements and positions (is positional). This condition can be dangerous if it occurs while you are doing something that could endanger you or others, such as driving.  CAUSES In many cases, the cause of this condition is not known. It may be caused by a disturbance in an area of the inner ear that helps your brain to sense movement and balance. This disturbance can be caused by a viral infection (labyrinthitis), head injury, or repetitive motion. RISK FACTORS This condition is more likely to develop in:  Women.  People who are 70 years of age or older. SYMPTOMS Symptoms of this condition usually happen when you move your head or your eyes in different directions. Symptoms may start suddenly, and they usually last for less than a minute. Symptoms may include:  Loss of balance and falling.  Feeling like you are spinning or moving.  Feeling like your surroundings are spinning or moving.  Nausea and vomiting.  Blurred vision.  Dizziness.  Involuntary eye movement (nystagmus). Symptoms can be mild and cause only slight annoyance, or they can be severe and interfere with daily life. Episodes of benign positional vertigo may return (recur) over time, and they may be triggered by certain  movements. Symptoms may improve over time. DIAGNOSIS This condition is usually diagnosed by medical history and a physical exam of the head, neck, and ears. You may be referred to a health care provider who specializes in ear, nose, and throat (ENT) problems (otolaryngologist) or a provider who specializes in disorders of the nervous system (neurologist). You may have additional testing, including:  MRI.  A CT scan.  Eye movement tests. Your health care provider may ask you to change positions quickly while he or she watches you for symptoms of benign positional vertigo, such as nystagmus. Eye movement may be tested with an electronystagmogram (ENG), caloric stimulation, the Dix-Hallpike test, or the roll test.  An electroencephalogram (EEG). This records electrical activity in your brain.  Hearing tests. TREATMENT Usually, your health care provider will treat this by moving your head in specific positions to adjust your inner ear back to normal. Surgery may be needed in severe cases, but this is rare. In some cases, benign positional vertigo may resolve on its own in 2-4 weeks. HOME CARE INSTRUCTIONS Safety  Move slowly.Avoid sudden body or head movements.  Avoid driving.  Avoid operating heavy machinery.  Avoid doing any tasks that would be dangerous to you or others if a vertigo episode would occur.  If you have trouble walking or keeping your balance, try using a cane for stability. If you feel dizzy or unstable, sit down right away.  Return to your normal activities as told by your health care provider. Ask your health care provider what activities  are safe for you. General Instructions  Take over-the-counter and prescription medicines only as told by your health care provider.  Avoid certain positions or movements as told by your health care provider.  Drink enough fluid to keep your urine clear or pale yellow.  Keep all follow-up visits as told by your health care  provider. This is important. SEEK MEDICAL CARE IF:  You have a fever.  Your condition gets worse or you develop new symptoms.  Your family or friends notice any behavioral changes.  Your nausea or vomiting gets worse.  You have numbness or a "pins and needles" sensation. SEEK IMMEDIATE MEDICAL CARE IF:  You have difficulty speaking or moving.  You are always dizzy.  You faint.  You develop severe headaches.  You have weakness in your legs or arms.  You have changes in your hearing or vision.  You develop a stiff neck.  You develop sensitivity to light.   This information is not intended to replace advice given to you by your health care provider. Make sure you discuss any questions you have with your health care provider.   Document Released: 01/06/2006 Document Revised: 12/20/2014 Document Reviewed: 07/24/2014 Elsevier Interactive Patient Education [employer].

## 2016-03-28 DIAGNOSIS — N644 Mastodynia: Secondary | ICD-10-CM | POA: Diagnosis not present

## 2016-04-15 DIAGNOSIS — N644 Mastodynia: Secondary | ICD-10-CM | POA: Diagnosis not present

## 2016-04-15 DIAGNOSIS — Z803 Family history of malignant neoplasm of breast: Secondary | ICD-10-CM | POA: Diagnosis not present

## 2016-04-17 DIAGNOSIS — J329 Chronic sinusitis, unspecified: Secondary | ICD-10-CM | POA: Diagnosis not present

## 2016-05-19 ENCOUNTER — Telehealth: Payer: Self-pay | Admitting: Neurology

## 2016-05-19 NOTE — Telephone Encounter (Signed)
Patient received a letter from [facility] stating a PA is needed for medication GILENYA 0.5 MG CAPS.

## 2016-05-19 NOTE — Telephone Encounter (Signed)
Received letter by mail today that Gilenya cap 0.101m is not on formulary. Needs PA.

## 2016-05-20 NOTE — Telephone Encounter (Signed)
Called patient back. Verified that she uses: [facility], [place], [place] - [address] to get refills on Gilenya. Advised I am working on her PA and will call with any updates. She verbalized understanding.

## 2016-05-20 NOTE — Telephone Encounter (Signed)
Received approval letter from [facility] for Gilenya effective 04/12/16-04/13/17.  Referral number: [id number].

## 2016-05-20 NOTE — Telephone Encounter (Signed)
Submitted PA Gilenya on covermymeds this am.  (Key: [id number]). Awaiting response.

## 2016-05-20 NOTE — Telephone Encounter (Signed)
Called and LVM for pt advising PA approved. Gave [facility] phone number if she has further questions.

## 2016-06-26 ENCOUNTER — Ambulatory Visit (INDEPENDENT_AMBULATORY_CARE_PROVIDER_SITE_OTHER): Payer: Medicare HMO | Admitting: Neurology

## 2016-06-26 ENCOUNTER — Encounter: Payer: Self-pay | Admitting: Neurology

## 2016-06-26 VITALS — BP 133/80 | HR 79 | Ht 65.5 in | Wt 136.5 lb

## 2016-06-26 DIAGNOSIS — G35 Multiple sclerosis: Secondary | ICD-10-CM | POA: Diagnosis not present

## 2016-06-26 DIAGNOSIS — Z5181 Encounter for therapeutic drug level monitoring: Secondary | ICD-10-CM | POA: Diagnosis not present

## 2016-06-26 DIAGNOSIS — G3184 Mild cognitive impairment, so stated: Secondary | ICD-10-CM | POA: Diagnosis not present

## 2016-06-26 NOTE — Progress Notes (Signed)
Reason for visit: Multiple sclerosis  [name] is an 76 y.o. female  History of present illness:  [name] is a 76 year old left-handed white female with a history of multiple sclerosis and Crohn's disease. The patient has been doing well with her multiple sclerosis, she remains on Gilenya, she is tolerating the medication well. She does have occasional diarrhea with the Crohn's disease. She reports no new symptoms of numbness, weakness, balance changes, difficulty controlling the bowels or the bladder, or any changes in vision. She does have some chronic fatigue issues that have remained stable. She reports some difficulty with focusing on tasks more so than a true memory problem. She believes that this issue has remained stable as well. She denies any new issues that have come up on a medical basis since last seen. She returns for an evaluation. She does have a history of cataracts, surgery has not yet been recommended.  Past Medical History:  Diagnosis Date  . Arthritis, degenerative    both knees  . B12 deficiency 07/06/2014  . Colitis   . Diverticulosis   . Duodenitis   . Fatty liver   . Gastritis   . Helicobacter pylori gastritis   . Hyperlipidemia   . Hypertension   . IBD (inflammatory bowel disease)   . Memory difficulty 01/05/2013  . Memory loss    mild  . Multiple sclerosis ([place])   . Osteopenia   . Vitamin B12 deficiency     Past Surgical History:  Procedure Laterality Date  . ABDOMINAL HYSTERECTOMY  2010  . BLADDER SURGERY  2010   Bladder Tack  . CERVICAL SPINE SURGERY    . CHOLECYSTECTOMY    . HEMORRHOID SURGERY    . ROTATOR CUFF REPAIR    . SPINAL FUSION    . TUBAL LIGATION      Family History  Problem Relation Age of Onset  . Diabetes Father   . Heart disease Father   . Colon polyps Mother   . Breast cancer Cousin   . Colon cancer Maternal Grandmother     ? may be duodenal cancer  . Colon polyps Brother   . Hypertension Brother      Social history:  reports that she quit smoking about 19 years ago. She has never used smokeless tobacco. She reports that she does not drink alcohol or use drugs.    Allergies  Allergen Reactions  . Vytorin [Ezetimibe-Simvastatin]     White skin patch  . Doxycycline     Raw mouth   . Erythromycin     REACTION: weakness  . Macrobid [Nitrofurantoin Macrocrystal] Hives  . Morphine Nausea And Vomiting  . Sulfonamide Derivatives Hives and Nausea And Vomiting  . Amoxicillin Hives    Medications:  Prior to Admission medications   Medication Sig Start Date End Date Taking? Authorizing Provider  B Complex Vitamins (VITAMIN B COMPLEX PO) Take 1 tablet by mouth daily.    Yes Historical Provider, MD  cetirizine (ZYRTEC) 10 MG tablet Take 10 mg by mouth daily.   Yes Historical Provider, MD  Cholecalciferol (VITAMIN D-3) 5000 UNITS TABS Take 5,000 Units by mouth daily.   Yes Historical Provider, MD  desonide (DESOWEN) 0.05 % lotion Apply 1 application topically daily.   Yes Historical Provider, MD  Evening Primrose Oil 500 MG CAPS Take 1 capsule by mouth daily.   Yes Historical Provider, MD  fenofibrate (TRICOR) 145 MG tablet Take 145 mg by mouth daily.   Yes Historical  Provider, MD  gabapentin (NEURONTIN) 600 MG tablet Take 1 tablet (600 mg total) by mouth 3 (three) times daily. 07/04/15  Yes Kathrynn Ducking, MD  GILENYA 0.5 MG CAPS TAKE ONE CAPSULE BY MOUTH DAILY 11/01/15  Yes Kathrynn Ducking, MD  Glucosamine 500 MG TABS Take 1 tablet by mouth 2 (two) times daily.    Yes Historical Provider, MD  lovastatin (MEVACOR) 40 MG tablet Take 40 mg by mouth 2 (two) times daily.     Yes Historical Provider, MD  memantine (NAMENDA) 10 MG tablet Take 10 mg by mouth 2 (two) times daily. 06/07/16  Yes Historical Provider, MD  Multiple Vitamin (MULTIVITAMIN) tablet Take 0.5 tablets by mouth daily.    Yes Historical Provider, MD  Probiotic Product (PROBIOTIC FORMULA PO) Take 1 tablet by mouth daily.     Yes  Historical Provider, MD  vitamin B-12 (CYANOCOBALAMIN) 1000 MCG tablet Take 1,000 mcg by mouth daily.     Yes Historical Provider, MD    ROS:  Out of a complete 14 system review of symptoms, the patient complains only of the following symptoms, and all other reviewed systems are negative.  Runny nose Light sensitivity Heat intolerance Frequent waking Environmental allergies Joint pain Memory loss  Blood pressure 133/80, pulse 79, height 5' 5.5" (1.664 m), weight 136 lb 8 oz (61.9 kg).  Physical Exam  General: The patient is alert and cooperative at the time of the examination.  Skin: No significant peripheral edema is noted.   Neurologic Exam  Mental status: The patient is alert and oriented x 3 at the time of the examination. The patient has apparent normal recent and remote memory, with an apparently normal attention span and concentration ability. Mini-Mental Status Examination done today shows a total score 30/30.   Cranial nerves: Facial symmetry is present. Speech is normal, no aphasia or dysarthria is noted. Extraocular movements are full. Visual fields are full. Pupils are equal, round, and reactive to light. Discs are flat bilaterally.  Motor: The patient has good strength in all 4 extremities.  Sensory examination: Soft touch sensation is symmetric on the face, arms, and legs.  Coordination: The patient has good finger-nose-finger and heel-to-shin bilaterally.  Gait and station: The patient has a normal gait. Tandem gait is normal. Romberg is negative. No drift is seen.  Reflexes: Deep tendon reflexes are symmetric.   Assessment/Plan:  1. Multiple sclerosis  2. Minimum cognitive impairment  The patient is doing well at this time, she will continue the [place]istad. Blood work will be done today, she will follow-up in 6 months or sooner if needed.  Jill Alexanders MD 06/26/2016 8:27 AM  [facility] [address] [place],  [place] [address]  Phone [phone] Fax [phone]

## 2016-06-27 LAB — CBC WITH DIFFERENTIAL/PLATELET
BASOS ABS: 0 10*3/uL (ref 0.0–0.2)
Basos: 0 %
EOS (ABSOLUTE): 0.1 10*3/uL (ref 0.0–0.4)
EOS: 3 %
HEMATOCRIT: 40.3 % (ref 34.0–46.6)
Hemoglobin: 12.7 g/dL (ref 11.1–15.9)
IMMATURE GRANS (ABS): 0 10*3/uL (ref 0.0–0.1)
IMMATURE GRANULOCYTES: 1 %
LYMPHS ABS: 0.2 10*3/uL — AB (ref 0.7–3.1)
LYMPHS: 6 %
MCH: 29.5 pg (ref 26.6–33.0)
MCHC: 31.5 g/dL (ref 31.5–35.7)
MCV: 94 fL (ref 79–97)
MONOCYTES: 12 %
Monocytes Absolute: 0.5 10*3/uL (ref 0.1–0.9)
Neutrophils Absolute: 3 10*3/uL (ref 1.4–7.0)
Neutrophils: 78 %
Platelets: 251 10*3/uL (ref 150–379)
RBC: 4.3 x10E6/uL (ref 3.77–5.28)
RDW: 14.1 % (ref 12.3–15.4)
WBC: 3.9 10*3/uL (ref 3.4–10.8)

## 2016-06-27 LAB — COMPREHENSIVE METABOLIC PANEL
ALT: 15 IU/L (ref 0–32)
AST: 23 IU/L (ref 0–40)
Albumin/Globulin Ratio: 1.5 (ref 1.2–2.2)
Albumin: 4.3 g/dL (ref 3.5–4.8)
Alkaline Phosphatase: 61 IU/L (ref 39–117)
BUN/Creatinine Ratio: 24 (ref 12–28)
BUN: 16 mg/dL (ref 8–27)
Bilirubin Total: 0.3 mg/dL (ref 0.0–1.2)
CO2: 23 mmol/L (ref 18–29)
CREATININE: 0.66 mg/dL (ref 0.57–1.00)
Calcium: 9.4 mg/dL (ref 8.7–10.3)
Chloride: 103 mmol/L (ref 96–106)
GFR calc Af Amer: 101 mL/min/{1.73_m2} (ref >59)
GFR calc non Af Amer: 87 mL/min/{1.73_m2} (ref >59)
GLUCOSE: 77 mg/dL (ref 65–99)
Globulin, Total: 2.8 g/dL (ref 1.5–4.5)
Potassium: 4 mmol/L (ref 3.5–5.2)
Sodium: 142 mmol/L (ref 134–144)
TOTAL PROTEIN: 7.1 g/dL (ref 6.0–8.5)

## 2016-07-31 ENCOUNTER — Telehealth: Payer: Self-pay | Admitting: Neurology

## 2016-07-31 NOTE — Telephone Encounter (Signed)
Patient called office to see if she is a candidate for new shingle shot due to her being on Gilenya. Please call patient and okay to leave message on answering machine at home per patient.

## 2016-07-31 NOTE — Telephone Encounter (Signed)
I called patient. The use of Gilenya will make the effectiveness of the vaccination less, there is no direct contraindication to getting vaccination, however.

## 2016-08-07 ENCOUNTER — Other Ambulatory Visit: Payer: Self-pay | Admitting: Neurology

## 2016-08-22 DIAGNOSIS — H2513 Age-related nuclear cataract, bilateral: Secondary | ICD-10-CM | POA: Diagnosis not present

## 2016-08-22 DIAGNOSIS — H04123 Dry eye syndrome of bilateral lacrimal glands: Secondary | ICD-10-CM | POA: Diagnosis not present

## 2016-08-22 DIAGNOSIS — H18423 Band keratopathy, bilateral: Secondary | ICD-10-CM | POA: Diagnosis not present

## 2016-08-22 DIAGNOSIS — G35 Multiple sclerosis: Secondary | ICD-10-CM | POA: Diagnosis not present

## 2016-08-22 DIAGNOSIS — Z79899 Other long term (current) drug therapy: Secondary | ICD-10-CM | POA: Diagnosis not present

## 2016-08-22 DIAGNOSIS — Z09 Encounter for follow-up examination after completed treatment for conditions other than malignant neoplasm: Secondary | ICD-10-CM | POA: Diagnosis not present

## 2016-08-26 DIAGNOSIS — L309 Dermatitis, unspecified: Secondary | ICD-10-CM | POA: Diagnosis not present

## 2016-08-26 DIAGNOSIS — L573 Poikiloderma of Civatte: Secondary | ICD-10-CM | POA: Diagnosis not present

## 2016-08-26 DIAGNOSIS — B078 Other viral warts: Secondary | ICD-10-CM | POA: Diagnosis not present

## 2016-08-26 DIAGNOSIS — L819 Disorder of pigmentation, unspecified: Secondary | ICD-10-CM | POA: Diagnosis not present

## 2016-08-26 DIAGNOSIS — D1801 Hemangioma of skin and subcutaneous tissue: Secondary | ICD-10-CM | POA: Diagnosis not present

## 2016-08-26 DIAGNOSIS — L570 Actinic keratosis: Secondary | ICD-10-CM | POA: Diagnosis not present

## 2016-08-26 DIAGNOSIS — L720 Epidermal cyst: Secondary | ICD-10-CM | POA: Diagnosis not present

## 2016-08-27 DIAGNOSIS — Z0100 Encounter for examination of eyes and vision without abnormal findings: Secondary | ICD-10-CM | POA: Diagnosis not present

## 2016-08-28 ENCOUNTER — Other Ambulatory Visit: Payer: Self-pay | Admitting: Neurology

## 2016-09-10 ENCOUNTER — Encounter: Payer: Self-pay | Admitting: Sports Medicine

## 2016-09-10 ENCOUNTER — Ambulatory Visit (INDEPENDENT_AMBULATORY_CARE_PROVIDER_SITE_OTHER): Payer: Medicare HMO | Admitting: Sports Medicine

## 2016-09-10 ENCOUNTER — Ambulatory Visit (INDEPENDENT_AMBULATORY_CARE_PROVIDER_SITE_OTHER): Payer: Medicare HMO

## 2016-09-10 VITALS — BP 120/72 | HR 79 | Ht 65.5 in | Wt 134.4 lb

## 2016-09-10 DIAGNOSIS — R222 Localized swelling, mass and lump, trunk: Secondary | ICD-10-CM | POA: Diagnosis not present

## 2016-09-10 DIAGNOSIS — G35 Multiple sclerosis: Secondary | ICD-10-CM | POA: Diagnosis not present

## 2016-09-10 DIAGNOSIS — M47812 Spondylosis without myelopathy or radiculopathy, cervical region: Secondary | ICD-10-CM

## 2016-09-10 DIAGNOSIS — M4184 Other forms of scoliosis, thoracic region: Secondary | ICD-10-CM | POA: Diagnosis not present

## 2016-09-10 DIAGNOSIS — M4322 Fusion of spine, cervical region: Secondary | ICD-10-CM | POA: Diagnosis not present

## 2016-09-10 DIAGNOSIS — M542 Cervicalgia: Secondary | ICD-10-CM | POA: Diagnosis not present

## 2016-09-10 NOTE — Progress Notes (Signed)
OFFICE VISIT NOTE [name]. [place], [place] [facility] [place]  [name] - 76 y.o. female MRN [id number]  Date of birth: 10/30/1940  Visit Date: 09/10/2016  PCP: Jonathon Jordan, MD   Referred by: Jonathon Jordan, MD  Burlene Arnt, CMA acting as scribe for Dr. Paulla Fore.  SUBJECTIVE:   Chief Complaint  Patient presents with  . bain in back   HPI: As below and per problem based documentation when appropriate.  Pt presents today with complaint of pain/knot on upper back. She has pain with the knot on the left side of she back. Last week she had some weakness in her left arm.  Pain has been off and on for years. The knot will "flare-up" from time to time.   The pain is described as soreness and is rated as 5/10 with flare-up.  Flare-ups seem to be random, nothing in particular seems to cause the knot to flare-up. Pt does have pain on the right side of her back when she raises her arm.  Improves with Tiger Balm Therapies tried include Tiger Balm. Pt has not tried applying heat but she has iced the area with temporary relief.   Other associated symptoms include: Pt c/o decreased ROM in both shoulders  Pt denies fever, chills, night sweats.   Pt has not had any recent imaging of her back.     Review of Systems  Constitutional: Negative for chills and fever.  Respiratory: Negative for shortness of breath and wheezing.   Cardiovascular: Negative for chest pain, palpitations and leg swelling.  Musculoskeletal: Negative for falls.  Neurological: Negative for dizziness, tingling and headaches.  Endo/Heme/Allergies: Does not bruise/bleed easily.    Otherwise per HPI.  HISTORY & PERTINENT PRIOR DATA:  No specialty comments available. She reports that she quit smoking about 19 years ago. She has never used smokeless tobacco. No results for input(s): HGBA1C, LABURIC in the last 8760 hours. Medications & Allergies reviewed per  EMR Patient Active Problem List   Diagnosis Date Noted  . Osteoarthritis of cervical spine 09/15/2016  . Abnormality of gait 11/07/2015  . Dizziness and giddiness 11/07/2015  . Mild cognitive impairment 01/09/2015  . B12 deficiency 07/06/2014  . Leukopenia 05/06/2013  . Memory difficulty 01/05/2013  . OTHER SYMPTOMS INVOLVING HEAD AND NECK 12/04/2008  . HYPERLIPIDEMIA 11/28/2008  . Multiple sclerosis ([place]) 11/28/2008  . INFLAMMATORY BOWEL DISEASE 11/28/2008  . DIVERTICULOSIS, COLON 11/28/2008  . FATTY LIVER DISEASE 11/28/2008  . PERSONAL HISTORY OF PEPTIC ULCER DISEASE 11/28/2008   Past Medical History:  Diagnosis Date  . Arthritis, degenerative    both knees  . B12 deficiency 07/06/2014  . Colitis   . Diverticulosis   . Duodenitis   . Fatty liver   . Gastritis   . Helicobacter pylori gastritis   . Hyperlipidemia   . Hypertension   . IBD (inflammatory bowel disease)   . Memory difficulty 01/05/2013  . Memory loss    mild  . Multiple sclerosis ([place])   . Osteopenia   . Vitamin B12 deficiency    Family History  Problem Relation Age of Onset  . Diabetes Father   . Heart disease Father   . Colon polyps Mother   . Breast cancer Cousin   . Colon cancer Maternal Grandmother        ? may be duodenal cancer  . Colon polyps Brother   . Hypertension Brother    Past Surgical History:  Procedure Laterality Date  . ABDOMINAL HYSTERECTOMY  2010  . BLADDER SURGERY  2010   Bladder Tack  . CERVICAL SPINE SURGERY    . CHOLECYSTECTOMY    . HEMORRHOID SURGERY    . ROTATOR CUFF REPAIR    . SPINAL FUSION    . TUBAL LIGATION     Social History   Occupational History  . Retired  [employer]    Social History Main Topics  . Smoking status: Former Smoker    Quit date: 06/27/1997  . Smokeless tobacco: Never Used  . Alcohol use No     Comment: rare  . Drug use: No  . Sexual activity: Not on file    OBJECTIVE:  VS:  HT:5' 5.5" (166.4 cm)    WT:134 lb 6.4 oz (61 kg)  BMI:22.1    BP:120/72  HR:79bpm  TEMP: ( )  RESP:96 % EXAM: Findings:  WDWN, NAD, Non-toxic appearing Alert & appropriately interactive Not depressed or anxious appearing No increased work of breathing. Pupils are equal. EOM intact without nystagmus No clubbing or cyanosis of the extremities appreciated No significant rashes/lesions/ulcerations overlying the examined area. Radial pulses 2+/4.  No significant generalized UE edema. Sensation intact to light touch in upper extremities.  Neck & Shoulders: Well aligned, no significant torticollis No significant midline tenderness.   TTP over bilateral paraspinal muscles and rhomboids/levator scapula. Cervical ROM:       Flexion: Normal      Extension: Normal      Right   - Rotation: 60 Sidebending: 15       Left     - Rotation: 60 Sidebending: 20  NEURAL TENSION SIGNS Right       Brachial Plexus Squeeze: Mildly tender       Arm Squeeze Test: Moderately tender      Spurling's Compression Test:  Ipsilateral -pain radiating into the rhomboids   Left       Brachial Plexus Squeeze: Mildly tender       Arm Squeeze Test: Non-tender       Spurling's Compression Test:  Ipsilateral -no pain  Lhermitte's Compression test:  Negative/ No radiation   REFLEXES                           Right                         Left DTR - C5 -Biceps               2+/4                       2+/4 DTR - C6 - Brachiorad 2+/4                       2+/4 DTR - C7 - Triceps              2+/4                       2+/4 UMN - Hoffman's Negative/Normal Negative/Normal  MOTOR TESTING: Intact in all UE myotomes      Dg Cervical Spine 2 Or 3 Views  Result Date: 09/10/2016 CLINICAL DATA:  Chronic neck pain. Some left arm pain and weakness. No recent injury. No odontoid view per providers protocol. EXAM: CERVICAL SPINE - 2-3 VIEW COMPARISON:  Thoracic spine  radiographs - earlier same day FINDINGS: C1 to the superior endplate of T2  is imaged on the provided lateral radiograph. Post C5-C7 ACDF and interbody fusion with apparent complete bony incorporation. No definite anterolisthesis or retrolisthesis. An open mouth odontoid radiograph was not provided Cervical vertebral body heights appear preserved. Remaining cervical intervertebral disc space heights appear preserved. Calcifications overlie the expected location the bilateral carotid bulbs. Regional soft tissues appear otherwise normal. Atherosclerotic plaque within the aortic arch. IMPRESSION: 1. Post C5-C7 ACDF and intervertebral disc space replacement without evidence of hardware failure or loosening. 2. No definite explanation for patient's chronic neck pain. Electronically Signed   By: Sandi Mariscal M.D.   On: 09/10/2016 09:00   Dg Thoracic Spine W/swimmers  Result Date: 09/10/2016 CLINICAL DATA:  Pt presents today with complaint of pain/knot on upper back. She has pain with the knot on the left side of she back. No swimmers performed. Able to visualize C7-T1 on c-spine films. EXAM: THORACIC SPINE - 3 VIEWS COMPARISON:  Cervical spine radiographs - earlier same day FINDINGS: There is a mild-to-moderate scoliotic curvature of the thoracolumbar spine with dominant mid component convex the right measuring approximately 14 degrees (as measured on the superior endplate of T9 to the inferior endplate of Y50). No anterolisthesis or retrolisthesis. Thoracic vertebral body heights are preserved. Thoracic intervertebral disc space heights are preserved Limited visualization of the adjacent thorax demonstrates atherosclerotic plaque within the aortic arch. Post cholecystectomy. Post lower cervical ACDF, incompletely evaluated. Regional soft tissues appear normal. IMPRESSION: 1. No definitive radiographic correlate for patient's palpable area of concern. 2. Mild-to-moderate scoliotic curvature of the thoracolumbar spine. 3.  Aortic Atherosclerosis (ICD10-I70.0). Electronically Signed   By: Sandi Mariscal M.D.   On: 09/10/2016 08:58   ASSESSMENT & PLAN:   Problem List Items Addressed This Visit    Multiple sclerosis [facility])    Patient is followed by neurology Dr. Jannifer Franklin and prior to making any medication changes I would like his input.  She has had multiple flareups of MS in the past and reports this seems to be significantly different and does seem to correlate more with the prior neck issues she had had.  If any lack of improvement or continued ongoing care consider reevaluation by neurology.      Osteoarthritis of cervical spine - Primary    Patient is concerned regarding a mass that is felt her back.  I believe this is we will represented of a trigger point likely secondary to the underlying nerve root impingement from her cervical spine.  She marked changes on x-ray does appear to have worsening adjacent level disease or priors C5, 6, 7 fusion.  We will begin with conservative measures including referral to physical therapy.  She is already on gabapentin and I would like to consider titrating her over to either Gralise or Lyrica given the lack of efficacy for her at this time.  She would like to avoid any type of steroid therapy at this time given the extensive amount that she has been on past for her multiple sclerosis.  We will plan to refer her to physical therapy for spine conditioning.  If any lack of improvement consider further evaluation with repeat imaging of the cervical spine that would likely plan to refer back to her neurosurgeon for their recommendations on further imaging      Relevant Orders   Ambulatory referral to Physical Therapy    Other Visit Diagnoses    Mass on back  Relevant Orders   DG Cervical Spine 2 or 3 views (Completed)   DG Thoracic Spine W/Swimmers (Completed)   Neck pain       Relevant Orders   Ambulatory referral to Physical Therapy   Fusion of spine, cervical region          Follow-up: Return in about 6 weeks (around 10/22/2016).    CMA/ATC served as Education administrator during this visit. History, Physical, and Plan performed by medical provider. Documentation and orders reviewed and attested to.      Teresa Coombs, [facility] Physician

## 2016-09-15 ENCOUNTER — Telehealth: Payer: Self-pay | Admitting: Neurology

## 2016-09-15 DIAGNOSIS — M47812 Spondylosis without myelopathy or radiculopathy, cervical region: Secondary | ICD-10-CM | POA: Insufficient documentation

## 2016-09-15 NOTE — Telephone Encounter (Signed)
I called patient. Patient has trigger points in the shoulder area and back. She will be going to physical therapy, I have suggested that they could possibly do dry needling, if they do not, I will be happy to try to do trigger point injections into the areas that are painful.  I will not increase the gabapentin or change the medication at this time.

## 2016-09-15 NOTE — Telephone Encounter (Signed)
I called the patient, left a message. The patient has had some increased back and neck discomfort, the current dose of gabapentin is not helping her. We may consider switching over to Lyrica to see if this helps better.

## 2016-09-15 NOTE — Assessment & Plan Note (Signed)
Patient is followed by neurology Dr. Jannifer Franklin and prior to making any medication changes I would like his input.  She has had multiple flareups of MS in the past and reports this seems to be significantly different and does seem to correlate more with the prior neck issues she had had.  If any lack of improvement or continued ongoing care consider reevaluation by neurology.

## 2016-09-15 NOTE — Assessment & Plan Note (Signed)
Patient is concerned regarding a mass that is felt her back.  I believe this is we will represented of a trigger point likely secondary to the underlying nerve root impingement from her cervical spine.  She marked changes on x-ray does appear to have worsening adjacent level disease or priors C5, 6, 7 fusion.  We will begin with conservative measures including referral to physical therapy.  She is already on gabapentin and I would like to consider titrating her over to either Gralise or Lyrica given the lack of efficacy for her at this time.  She would like to avoid any type of steroid therapy at this time given the extensive amount that she has been on past for her multiple sclerosis.  We will plan to refer her to physical therapy for spine conditioning.  If any lack of improvement consider further evaluation with repeat imaging of the cervical spine that would likely plan to refer back to her neurosurgeon for their recommendations on further imaging

## 2016-09-18 ENCOUNTER — Ambulatory Visit (INDEPENDENT_AMBULATORY_CARE_PROVIDER_SITE_OTHER): Payer: Medicare HMO | Admitting: Physical Therapy

## 2016-09-18 DIAGNOSIS — M542 Cervicalgia: Secondary | ICD-10-CM | POA: Diagnosis not present

## 2016-09-18 DIAGNOSIS — M791 Myalgia, unspecified site: Secondary | ICD-10-CM

## 2016-09-18 DIAGNOSIS — R293 Abnormal posture: Secondary | ICD-10-CM

## 2016-09-18 DIAGNOSIS — R29898 Other symptoms and signs involving the musculoskeletal system: Secondary | ICD-10-CM

## 2016-09-18 NOTE — Patient Instructions (Signed)
Flexibility: Upper Trapezius Stretch    Gently grasp right side of head while holding onto the chair or bed. Tilt head away until a gentle stretch is felt. Hold __30__ seconds. Repeat _2-3___ times per set. Do _1___ sets per session. Do _2-3___ sessions per day.  Levator Scapula Stretch, Sitting    Sit, one hand tucked under hip on side to be stretched, other hand over top of head. Turn head toward other side and look down. Use hand on head to gently stretch neck in that position. Hold _30__ seconds. Repeat __2-3_ times per session. Do _2-3__ sessions per day.  Scapula Adduction With Pectoralis Stretch: Low - Standing   Shoulders at 45 hands even with shoulders, keeping weight through legs, shift weight forward until you feel pull or stretch through the front of your chest. Hold _30__ seconds. Do _2-3__ times, _2-3__ times per day.   Scapular Retraction (Standing)    With arms at sides, pinch shoulder blades together.  Hold for 5 seconds. Repeat _10___ times per set. Do _1___ sets per session. Do __2-3__ sessions per day.  http://orth.exer.us/944   Copyright  VHI. All rights reserved.     Trigger Point Dry Needling  . What is Trigger Point Dry Needling (DN)? o DN is a physical therapy technique used to treat muscle pain and dysfunction. Specifically, DN helps deactivate muscle trigger points (muscle knots).  o A thin filiform needle is used to penetrate the skin and stimulate the underlying trigger point. The goal is for a local twitch response (LTR) to occur and for the trigger point to relax. No medication of any kind is injected during the procedure.   . What Does Trigger Point Dry Needling Feel Like?  o The procedure feels different for each individual patient. Some patients report that they do not actually feel the needle enter the skin and overall the process is not painful. Very mild bleeding may occur. However, many patients feel a deep cramping in the muscle in  which the needle was inserted. This is the local twitch response.   [name] Kitchen How Will I feel after the treatment? o Soreness is normal, and the onset of soreness may not occur for a few hours. Typically this soreness does not last longer than two days.  o Bruising is uncommon, however; ice can be used to decrease any possible bruising.  o In rare cases feeling tired or nauseous after the treatment is normal. In addition, your symptoms may get worse before they get better, this period will typically not last longer than 24 hours.   . What Can I do After My Treatment? o Increase your hydration by drinking more water for the next 24 hours. o You may place ice or heat on the areas treated that have become sore, however, do not use heat on inflamed or bruised areas. Heat often brings more relief post needling. o You can continue your regular activities, but vigorous activity is not recommended initially after the treatment for 24 hours. o DN is best combined with other physical therapy such as strengthening, stretching, and other therapies.

## 2016-09-18 NOTE — Therapy (Signed)
[facility] [address] [place], [place], [address] Phone: [phone]   Fax:  [phone]  Physical Therapy Evaluation  Patient Details  Name: [name] MRN: [id number] Date of Birth: 1941-03-30 Referring Provider: Dr. Teresa Coombs  Encounter Date: 09/18/2016      PT End of Session - 09/18/16 0848    Visit Number 1   Number of Visits 12   Date for PT Re-Evaluation 10/30/16   PT Start Time 0800   PT Stop Time 0841   PT Time Calculation (min) 41 min   Activity Tolerance Patient tolerated treatment well   Behavior During Therapy [facility] for tasks assessed/performed      Past Medical History:  Diagnosis Date  . Arthritis, degenerative    both knees  . B12 deficiency 07/06/2014  . Colitis   . Diverticulosis   . Duodenitis   . Fatty liver   . Gastritis   . Helicobacter pylori gastritis   . Hyperlipidemia   . Hypertension   . IBD (inflammatory bowel disease)   . Memory difficulty 01/05/2013  . Memory loss    mild  . Multiple sclerosis ([place])   . Osteopenia   . Vitamin B12 deficiency     Past Surgical History:  Procedure Laterality Date  . ABDOMINAL HYSTERECTOMY  2010  . BLADDER SURGERY  2010   Bladder Tack  . CERVICAL SPINE SURGERY    . CHOLECYSTECTOMY    . HEMORRHOID SURGERY    . ROTATOR CUFF REPAIR    . SPINAL FUSION    . TUBAL LIGATION      There were no vitals filed for this visit.       Subjective Assessment - 09/18/16 0803    Subjective Pt is a 76 y/o female who presents to OPPT with bil pain in neck and shoulders, left worse than right.  Pt reports ~ 2 weeks ago experienced Lt sided weakness which resolved after 5-6 days (denies facial weakness).    Pertinent History MS (relapsing remitting), L RTC Repair, ACDF C5-7   Diagnostic tests xrays   Patient Stated Goals improve pain, decrease flare ups   Currently in Pain? Yes   Pain Score 0-No pain   Pain Location Back   Pain Orientation Upper  upper  thoracic along scapula   Pain Descriptors / Indicators Sore;Aching   Pain Type Chronic pain   Pain Radiating Towards none   Pain Onset More than a month ago   Pain Frequency Intermittent   Aggravating Factors  unknown; recently has been carrying new granddaugther (65 weeks old)   Pain Relieving Factors massages, tiger balm            [facility] PT Assessment - 09/18/16 0812      Assessment   Medical Diagnosis neck pain   Referring Provider Dr. Teresa Coombs   Onset Date/Surgical Date --  4-5 years ago   Hand Dominance Left   Next MD Visit 10/22/16   Prior Therapy at neuro for BPPV     Precautions   Precautions None     Restrictions   Weight Bearing Restrictions No     Balance Screen   Has the patient fallen in the past 6 months No   Has the patient had a decrease in activity level because of a fear of falling?  No   Is the patient reluctant to leave their home because of a fear of falling?  No     Home Environment  Living Environment Private residence   Living Arrangements Spouse/significant other;Children  daughter's family temporarily   Available Help at Discharge Family   Type of [place] Access Level entry   [place] Two level;Able to live on main level with bedroom/bathroom   Alternate Level Stairs-Number of Steps 14   Alternate Level Stairs-Rails Right   Home Equipment None     Prior Function   Level of Independence Independent   Vocation Part time employment   Comptroller, bookkeeping work   Leisure walking, meet with friends, go out to eat, spend time with grandchildren, yoga     Cognition   Overall Cognitive Status Within Functional Limits for tasks assessed     Posture/Postural Control   Posture/Postural Control Postural limitations   Postural Limitations Rounded Shoulders;Forward head     AROM   Overall AROM Comments increased tightness with sidebending   AROM Assessment Site Cervical   Cervical Flexion WNL    Cervical Extension WNL   Cervical - Right Side Bend 20   Cervical - Left Side Bend 22   Cervical - Right Rotation 40   Cervical - Left Rotation 40     Strength   Strength Assessment Site Shoulder;Elbow   Right/Left Shoulder Right;Left   Right Shoulder Flexion 4/5   Right Shoulder ABduction 4/5   Right Shoulder Internal Rotation 5/5   Right Shoulder External Rotation 4/5   Left Shoulder Flexion 5/5   Left Shoulder ABduction 4/5   Left Shoulder Internal Rotation 5/5   Left Shoulder External Rotation 4/5   Right/Left Elbow Right;Left   Right Elbow Flexion 5/5   Right Elbow Extension 4/5   Left Elbow Flexion 5/5   Left Elbow Extension 5/5     Palpation   Palpation comment trigger points and tightness along bil UT and levator, Lt > Rt     Special Tests    Special Tests Cervical   Cervical Tests Spurling's;Dictraction     Spurling's   Findings Negative     Distraction Test   Findngs Negative            Objective measurements completed on examination: See above findings.          [facility] Adult PT Treatment/Exercise - 09/18/16 0812      Exercises   Exercises Neck     Neck Exercises: Seated   Shoulder Rolls Backwards;5 reps   Other Seated Exercise scapular retraction 10 x 5 sec     Neck Exercises: Stretches   Upper Trapezius Stretch 1 rep;30 seconds   Upper Trapezius Stretch Limitations bil; for HEP instruction   Levator Stretch 1 rep;30 seconds   Levator Stretch Limitations bil; for HEP instruction   Other Neck Stretches low doorway stretch 2x30 sec for HEP instruction                PT Education - 09/18/16 0848    Education provided Yes   Education Details HEP, TDN, plan of care   Person(s) Educated Patient   Methods Explanation;Demonstration;Handout   Comprehension Verbalized understanding;Returned demonstration;Need further instruction             PT Long Term Goals - 09/18/16 5573      PT LONG TERM GOAL #1   Title independent with  HEP (10/30/16)   Time 6   Period Weeks   Status New     PT LONG TERM GOAL #2   Title verbalize understanding of posture and body mechanics to  decrease risk of reinjury (10/30/16)   Time 6   Period Weeks   Status New     PT LONG TERM GOAL #3   Title demonstrate decreased trigger points with decreased fascial restrictions for improved UE function (10/30/16)   Time 6   Period Weeks   Status New     PT LONG TERM GOAL #4   Title report 50% decrease in symptoms for improved function (10/30/16)   Time 6   Period Weeks   Status New                Plan - 10/17/16 0848    Clinical Impression Statement Pt is a 76 y/o female who presents to OPPT with 5 year history of intermittent neck and upper thoracic pain.  Pt demonstrates mild ROM deficits, postural dysfunction, strength deficits and trigger points.  Will benefit from PT to address deficits.   History and Personal Factors relevant to plan of care: MS   Clinical Presentation Evolving   Clinical Presentation due to: MS, unknown aggravating factors   Clinical Decision Making Moderate   Rehab Potential Good   PT Frequency 2x / week  plan to see 1x/wk with option to see 2x if needed   PT Duration 6 weeks   PT Treatment/Interventions ADLs/Self Care Home Management;Cryotherapy;Electrical Stimulation;Moist Heat;Ultrasound;Therapeutic exercise;Therapeutic activities;Patient/family education;Manual techniques;Passive range of motion;Taping;Dry needling   PT Next Visit Plan review HEP, TDN as indicated, modalities PRN, continue posture exercises   Consulted and Agree with Plan of Care Patient      Patient will benefit from skilled therapeutic intervention in order to improve the following deficits and impairments:  Postural dysfunction, Decreased range of motion, Increased muscle spasms, Increased fascial restricitons, Decreased strength  Visit Diagnosis: Cervicalgia - Plan: PT plan of care cert/re-cert  Other symptoms and signs  involving the musculoskeletal system - Plan: PT plan of care cert/re-cert  Myalgia - Plan: PT plan of care cert/re-cert  Abnormal posture - Plan: PT plan of care cert/re-cert      [facility] PT PB G-CODES - 10/17/16 0856    Functional Assessment Tool Used  clinical judgement, porstural limitations, decreased ROM and strength   Functional Limitations Carrying, moving and handling objects   Carrying, Moving and Handling Objects Current Status (N4709) At least 20 percent but less than 40 percent impaired, limited or restricted   Carrying, Moving and Handling Objects Goal Status (G2836) At least 1 percent but less than 20 percent impaired, limited or restricted       Problem List Patient Active Problem List   Diagnosis Date Noted  . Osteoarthritis of cervical spine 09/15/2016  . Abnormality of gait 11/07/2015  . Dizziness and giddiness 11/07/2015  . Mild cognitive impairment 01/09/2015  . B12 deficiency 07/06/2014  . Leukopenia 05/06/2013  . Memory difficulty 01/05/2013  . OTHER SYMPTOMS INVOLVING HEAD AND NECK 12/04/2008  . HYPERLIPIDEMIA 11/28/2008  . Multiple sclerosis ([place]) 11/28/2008  . INFLAMMATORY BOWEL DISEASE 11/28/2008  . DIVERTICULOSIS, COLON 11/28/2008  . FATTY LIVER DISEASE 11/28/2008  . PERSONAL HISTORY OF PEPTIC ULCER DISEASE 11/28/2008     Laureen Abrahams, PT, DPT October 17, 2016 8:57 AM    [facility] [address] [place], [place], [address] Phone: [phone]   Fax:  [phone]  Name: SYDELL PROWELL MRN: [id number] Date of Birth: Dec 15, 1940

## 2016-09-22 ENCOUNTER — Ambulatory Visit (INDEPENDENT_AMBULATORY_CARE_PROVIDER_SITE_OTHER): Payer: Medicare HMO | Admitting: Physical Therapy

## 2016-09-22 DIAGNOSIS — R293 Abnormal posture: Secondary | ICD-10-CM

## 2016-09-22 DIAGNOSIS — R29898 Other symptoms and signs involving the musculoskeletal system: Secondary | ICD-10-CM | POA: Diagnosis not present

## 2016-09-22 DIAGNOSIS — M542 Cervicalgia: Secondary | ICD-10-CM

## 2016-09-22 DIAGNOSIS — M791 Myalgia, unspecified site: Secondary | ICD-10-CM

## 2016-09-22 NOTE — Therapy (Signed)
[facility] [address] [place], [place], [address] Phone: [phone]   Fax:  [phone]  Physical Therapy Treatment  Patient Details  Name: [name] MRN: [id number] Date of Birth: December 28, 1940 Referring Provider: Dr. Teresa Coombs  Encounter Date: 09/22/2016      PT End of Session - 09/22/16 0929    Visit Number 2   Number of Visits 12   Date for PT Re-Evaluation 10/30/16   PT Start Time 0846   PT Stop Time 0927   PT Time Calculation (min) 41 min   Activity Tolerance Patient tolerated treatment well;No increased pain   Behavior During Therapy [facility] for tasks assessed/performed      Past Medical History:  Diagnosis Date  . Arthritis, degenerative    both knees  . B12 deficiency 07/06/2014  . Colitis   . Diverticulosis   . Duodenitis   . Fatty liver   . Gastritis   . Helicobacter pylori gastritis   . Hyperlipidemia   . Hypertension   . IBD (inflammatory bowel disease)   . Memory difficulty 01/05/2013  . Memory loss    mild  . Multiple sclerosis ([place])   . Osteopenia   . Vitamin B12 deficiency     Past Surgical History:  Procedure Laterality Date  . ABDOMINAL HYSTERECTOMY  2010  . BLADDER SURGERY  2010   Bladder Tack  . CERVICAL SPINE SURGERY    . CHOLECYSTECTOMY    . HEMORRHOID SURGERY    . ROTATOR CUFF REPAIR    . SPINAL FUSION    . TUBAL LIGATION      There were no vitals filed for this visit.      Subjective Assessment - 09/22/16 0845    Subjective doing okay; reports doorway exercises is irritating Rt shoulder a little bit.     Pertinent History MS (relapsing remitting), L RTC Repair, ACDF C5-7   Diagnostic tests xrays   Patient Stated Goals improve pain, decrease flare ups   Pain Score 0-No pain                         [facility] Adult PT Treatment/Exercise - 09/22/16 0001      Self-Care   Self-Care Other Self-Care Comments   Other Self-Care Comments  instructed in myofascial  release with ball     Neck Exercises: Theraband   Shoulder Extension 15 reps;Red   Shoulder Extension Limitations 5 sec hold   Rows 15 reps;Red   Rows Limitations 5 sec hold   Shoulder External Rotation 15 reps;Red   Shoulder External Rotation Limitations supine on towel roll   Horizontal ABduction 15 reps;Red   Horizontal ABduction Limitations supine on towel roll     Neck Exercises: Stretches   Upper Trapezius Stretch 2 reps;30 seconds   Levator Stretch 2 reps;30 seconds   Other Neck Stretches towel stretch supine x 5 min with arms out   Other Neck Stretches reviewed doorway stretch; pt prefers "t" arm hold                PT Education - 09/22/16 0929    Education provided Yes   Education Details use of ball for myofascial work   [employer]) Educated Patient   Methods Explanation;Demonstration   Comprehension Verbalized understanding;Returned demonstration             PT Long Term Goals - 09/18/16 0851      PT LONG TERM GOAL #1  Title independent with HEP (10/30/16)   Time 6   Period Weeks   Status New     PT LONG TERM GOAL #2   Title verbalize understanding of posture and body mechanics to decrease risk of reinjury (10/30/16)   Time 6   Period Weeks   Status New     PT LONG TERM GOAL #3   Title demonstrate decreased trigger points with decreased fascial restrictions for improved UE function (10/30/16)   Time 6   Period Weeks   Status New     PT LONG TERM GOAL #4   Title report 50% decrease in symptoms for improved function (10/30/16)   Time 6   Period Weeks   Status New               Plan - 09/22/16 2419    Clinical Impression Statement Pt needed min cues for HEP today but overall compliant with exercises.  Progressed to theraband exercises today without increase in pain.  Pt to see what color therabands she has a home and will update HEP next session.  Will continue to benefit from PT to maximize function.   PT Treatment/Interventions  ADLs/Self Care Home Management;Cryotherapy;Electrical Stimulation;Moist Heat;Ultrasound;Therapeutic exercise;Therapeutic activities;Patient/family education;Manual techniques;Passive range of motion;Taping;Dry needling   PT Next Visit Plan add theraband to HEP, TDN as indicated, modalities PRN, continue posture exercises   Consulted and Agree with Plan of Care Patient      Patient will benefit from skilled therapeutic intervention in order to improve the following deficits and impairments:  Postural dysfunction, Decreased range of motion, Increased muscle spasms, Increased fascial restricitons, Decreased strength  Visit Diagnosis: Cervicalgia  Other symptoms and signs involving the musculoskeletal system  Myalgia  Abnormal posture     Problem List Patient Active Problem List   Diagnosis Date Noted  . Osteoarthritis of cervical spine 09/15/2016  . Abnormality of gait 11/07/2015  . Dizziness and giddiness 11/07/2015  . Mild cognitive impairment 01/09/2015  . B12 deficiency 07/06/2014  . Leukopenia 05/06/2013  . Memory difficulty 01/05/2013  . OTHER SYMPTOMS INVOLVING HEAD AND NECK 12/04/2008  . HYPERLIPIDEMIA 11/28/2008  . Multiple sclerosis ([place]) 11/28/2008  . INFLAMMATORY BOWEL DISEASE 11/28/2008  . DIVERTICULOSIS, COLON 11/28/2008  . FATTY LIVER DISEASE 11/28/2008  . PERSONAL HISTORY OF PEPTIC ULCER DISEASE 11/28/2008      Laureen Abrahams, PT, DPT 09/22/16 9:34 AM    [facility] [address] [place], [place], [address] Phone: [phone]   Fax:  [phone]  Name: [name] MRN: [id number] Date of Birth: 1940/04/27

## 2016-10-01 ENCOUNTER — Ambulatory Visit (INDEPENDENT_AMBULATORY_CARE_PROVIDER_SITE_OTHER): Payer: Medicare HMO | Admitting: Physical Therapy

## 2016-10-01 DIAGNOSIS — R29898 Other symptoms and signs involving the musculoskeletal system: Secondary | ICD-10-CM | POA: Diagnosis not present

## 2016-10-01 DIAGNOSIS — M542 Cervicalgia: Secondary | ICD-10-CM

## 2016-10-01 DIAGNOSIS — R293 Abnormal posture: Secondary | ICD-10-CM | POA: Diagnosis not present

## 2016-10-01 DIAGNOSIS — M791 Myalgia, unspecified site: Secondary | ICD-10-CM

## 2016-10-01 NOTE — Patient Instructions (Signed)
Scapular Retraction: Rowing (Eccentric) - Arms - Side (Resistance Band)    Hold end of band in each hand. Pull back until elbows are even with trunk. Keep elbows by sides, thumbs up. Slowly release for 3-5 seconds. Use ___green_____ resistance band. _10__ reps per set, _1-2__ sets per day, __7_ days per week.   Strengthening: Resisted Internal Rotation   Hold tubing in left hand, elbow at side and forearm out. Rotate forearm in across body.  Repeat with other arm. Repeat __10__ times per set. Do __1__ sets per session. Do __1-2__ sessions per day.  http://orth.exer.us/830   Copyright  VHI. All rights reserved.  Strengthening: Resisted External Rotation   Hold tubing in right hand, elbow at side and forearm across body. Rotate forearm out. Repeat with other arm. Repeat __10__ times per set. Do _1___ sets per session. Do __1-2__ sessions per day.        Strengthening: Resisted Extension   Hold tubing in both hands, arm forward. Pull arm back, elbow straight. Repeat __10__ times per set. Do __1__ sets per session. Do _1-2___ sessions per day.  http://orth.exer.us/832   Copyright  VHI. All rights reserved.

## 2016-10-01 NOTE — Therapy (Signed)
[facility] [address] [place], [place], [address] Phone: [phone]   Fax:  [phone]  Physical Therapy Treatment  Patient Details  Name: [name] MRN: [id number] Date of Birth: October 25, 1940 Referring Provider: Dr. Teresa Coombs  Encounter Date: 10/01/2016      PT End of Session - 10/01/16 0839    Visit Number 3   Number of Visits 12   Date for PT Re-Evaluation 10/30/16   PT Start Time 0758   PT Stop Time 0838   PT Time Calculation (min) 40 min   Activity Tolerance Patient tolerated treatment well   Behavior During Therapy [facility] for tasks assessed/performed      Past Medical History:  Diagnosis Date  . Arthritis, degenerative    both knees  . B12 deficiency 07/06/2014  . Colitis   . Diverticulosis   . Duodenitis   . Fatty liver   . Gastritis   . Helicobacter pylori gastritis   . Hyperlipidemia   . Hypertension   . IBD (inflammatory bowel disease)   . Memory difficulty 01/05/2013  . Memory loss    mild  . Multiple sclerosis ([place])   . Osteopenia   . Vitamin B12 deficiency     Past Surgical History:  Procedure Laterality Date  . ABDOMINAL HYSTERECTOMY  2010  . BLADDER SURGERY  2010   Bladder Tack  . CERVICAL SPINE SURGERY    . CHOLECYSTECTOMY    . HEMORRHOID SURGERY    . ROTATOR CUFF REPAIR    . SPINAL FUSION    . TUBAL LIGATION      There were no vitals filed for this visit.      Subjective Assessment - 10/01/16 0756    Subjective hasn't had any issues since starting physical therapy.  doing well.  reports neck feels a little stiff today.   Pertinent History MS (relapsing remitting), L RTC Repair, ACDF C5-7   Patient Stated Goals improve pain, decrease flare ups   Pain Score 0-No pain                         [facility] Adult PT Treatment/Exercise - 10/01/16 0801      Neck Exercises: Theraband   Shoulder Extension 10 reps;Green   Shoulder Extension Limitations 5 sec hold   Rows  10 reps;Green   Rows Limitations 5 sec hold   Shoulder External Rotation 10 reps;Green   Shoulder External Rotation Limitations bil; standing   Horizontal ABduction 15 reps;Red   Horizontal ABduction Limitations standing   Other Theraband Exercises standing bil external rotation with red theraband x 10 reps     Neck Exercises: Supine   Neck Retraction 10 reps;5 secs   Capital Flexion 10 reps;3 secs     Manual Therapy   Manual Therapy Myofascial release;Soft tissue mobilization   Soft tissue mobilization bil cervical paraspinals and bil levator scapulae and upper traps   Myofascial Release suboccipital release     Neck Exercises: Stretches   Upper Trapezius Stretch 2 reps;30 seconds  supine   Other Neck Stretches towel stretch supine x 5 min with arms out                PT Education - 10/01/16 0838    Education provided Yes   Education Details HEP for strengthening   Person(s) Educated Patient   Methods Explanation;Demonstration;Handout   Comprehension Verbalized understanding;Returned demonstration  PT Long Term Goals - 09/18/16 7341      PT LONG TERM GOAL #1   Title independent with HEP (10/30/16)   Time 6   Period Weeks   Status New     PT LONG TERM GOAL #2   Title verbalize understanding of posture and body mechanics to decrease risk of reinjury (10/30/16)   Time 6   Period Weeks   Status New     PT LONG TERM GOAL #3   Title demonstrate decreased trigger points with decreased fascial restrictions for improved UE function (10/30/16)   Time 6   Period Weeks   Status New     PT LONG TERM GOAL #4   Title report 50% decrease in symptoms for improved function (10/30/16)   Time 6   Period Weeks   Status New               Plan - 10/01/16 9379    Clinical Impression Statement Pt reports no "flare ups" since beginning PT and compliant with exercises.  Added posterior shoulder strengthening exercises to HEP today.  Will plan to see how  pt is doing next week to determine hold v. continue.   PT Treatment/Interventions ADLs/Self Care Home Management;Cryotherapy;Electrical Stimulation;Moist Heat;Ultrasound;Therapeutic exercise;Therapeutic activities;Patient/family education;Manual techniques;Passive range of motion;Taping;Dry needling   PT Next Visit Plan review HEP, TDN if needed; discuss hold v continue (depending on how pt does this week)   Consulted and Agree with Plan of Care Patient      Patient will benefit from skilled therapeutic intervention in order to improve the following deficits and impairments:  Postural dysfunction, Decreased range of motion, Increased muscle spasms, Increased fascial restricitons, Decreased strength  Visit Diagnosis: Cervicalgia  Other symptoms and signs involving the musculoskeletal system  Myalgia  Abnormal posture     Problem List Patient Active Problem List   Diagnosis Date Noted  . Osteoarthritis of cervical spine 09/15/2016  . Abnormality of gait 11/07/2015  . Dizziness and giddiness 11/07/2015  . Mild cognitive impairment 01/09/2015  . B12 deficiency 07/06/2014  . Leukopenia 05/06/2013  . Memory difficulty 01/05/2013  . OTHER SYMPTOMS INVOLVING HEAD AND NECK 12/04/2008  . HYPERLIPIDEMIA 11/28/2008  . Multiple sclerosis ([place]) 11/28/2008  . INFLAMMATORY BOWEL DISEASE 11/28/2008  . DIVERTICULOSIS, COLON 11/28/2008  . FATTY LIVER DISEASE 11/28/2008  . PERSONAL HISTORY OF PEPTIC ULCER DISEASE 11/28/2008      Laureen Abrahams, PT, DPT 10/01/16 8:41 AM    [facility] [place], [place], [address] Phone: [phone]   Fax:  [phone]  Name: [name] MRN: [id number] Date of Birth: 04/17/1940

## 2016-10-08 ENCOUNTER — Ambulatory Visit (INDEPENDENT_AMBULATORY_CARE_PROVIDER_SITE_OTHER): Payer: Medicare HMO | Admitting: Physical Therapy

## 2016-10-08 DIAGNOSIS — R29898 Other symptoms and signs involving the musculoskeletal system: Secondary | ICD-10-CM | POA: Diagnosis not present

## 2016-10-08 DIAGNOSIS — M542 Cervicalgia: Secondary | ICD-10-CM | POA: Diagnosis not present

## 2016-10-08 DIAGNOSIS — M791 Myalgia, unspecified site: Secondary | ICD-10-CM

## 2016-10-08 DIAGNOSIS — R293 Abnormal posture: Secondary | ICD-10-CM | POA: Diagnosis not present

## 2016-10-08 NOTE — Patient Instructions (Signed)

## 2016-10-08 NOTE — Therapy (Addendum)
[facility] [address] [place], [place], [address] Phone: [phone]   Fax:  [phone]  Physical Therapy Treatment  Patient Details  Name: [name] MRN: [id number] Date of Birth: 08-02-1940 Referring Provider: Dr. Teresa Coombs  Encounter Date: 10/08/2016      PT End of Session - 10/08/16 0831    Visit Number 4   Number of Visits 12   Date for PT Re-Evaluation 10/30/16   PT Start Time 0759   PT Stop Time 0829  d/c visit   PT Time Calculation (min) 30 min   Activity Tolerance Patient tolerated treatment well   Behavior During Therapy [facility] for tasks assessed/performed      Past Medical History:  Diagnosis Date  . Arthritis, degenerative    both knees  . B12 deficiency 07/06/2014  . Colitis   . Diverticulosis   . Duodenitis   . Fatty liver   . Gastritis   . Helicobacter pylori gastritis   . Hyperlipidemia   . Hypertension   . IBD (inflammatory bowel disease)   . Memory difficulty 01/05/2013  . Memory loss    mild  . Multiple sclerosis ([place])   . Osteopenia   . Vitamin B12 deficiency     Past Surgical History:  Procedure Laterality Date  . ABDOMINAL HYSTERECTOMY  2010  . BLADDER SURGERY  2010   Bladder Tack  . CERVICAL SPINE SURGERY    . CHOLECYSTECTOMY    . HEMORRHOID SURGERY    . ROTATOR CUFF REPAIR    . SPINAL FUSION    . TUBAL LIGATION      There were no vitals filed for this visit.      Subjective Assessment - 10/08/16 0802    Subjective doing well; still having a little discomfort along Rt shoulder, but able to manage.  feels 70% improved from last flare.   Pertinent History MS (relapsing remitting), L RTC Repair, ACDF C5-7   Patient Stated Goals improve pain, decrease flare ups   Currently in Pain? No/denies            [facility] PT Assessment - 10/08/16 0830      Palpation   Palpation comment trigger points still present in UT and levator; currently latent and not causing pain                      [facility] Adult PT Treatment/Exercise - 10/08/16 0807      Self-Care   Other Self-Care Comments  educated on posture and body mechanics     Neck Exercises: Theraband   Scapula Retraction --   Shoulder Extension 15 reps;Red   Rows 15 reps;Red   Shoulder External Rotation 15 reps;Red   Other Theraband Exercises standing bil internal rotation with red theraband x 15 reps     Neck Exercises: Stretches   Upper Trapezius Stretch 1 rep;30 seconds   Levator Stretch 1 rep;30 seconds                PT Education - 10/08/16 0829    Education provided Yes   Education Details posture/body mechanics   Person(s) Educated Patient   Methods Explanation;Handout   Comprehension Verbalized understanding             PT Long Term Goals - 10/08/16 0831      PT LONG TERM GOAL #1   Title independent with HEP (10/30/16)   Status Achieved     PT LONG TERM GOAL #2  Title verbalize understanding of posture and body mechanics to decrease risk of reinjury (10/30/16)   Status Achieved     PT LONG TERM GOAL #3   Title demonstrate decreased trigger points with decreased fascial restrictions for improved UE function (10/30/16)   Baseline 11-04-2016: trigger points present but currently latent   Status Partially Met     PT LONG TERM GOAL #4   Title report 50% decrease in symptoms for improved function (10/30/16)   Status Achieved               Plan - Nov 04, 2016 9201    Clinical Impression Statement Pt continues to be doing well without any flare ups.  Only had blue theraband at home so decreased to red theraband due to slight increase in Rt shoulder pain with ex rotation with blue theraband.  At this time recommend holding PT x 30 days and pt to return if symptoms develop.   PT Treatment/Interventions ADLs/Self Care Home Management;Cryotherapy;Electrical Stimulation;Moist Heat;Ultrasound;Therapeutic exercise;Therapeutic activities;Patient/family education;Manual  techniques;Passive range of motion;Taping;Dry needling   PT Next Visit Plan hold x 30 days; will need new g code and reassessment if pt returns; possible TDN   Consulted and Agree with Plan of Care Patient      Patient will benefit from skilled therapeutic intervention in order to improve the following deficits and impairments:  Postural dysfunction, Decreased range of motion, Increased muscle spasms, Increased fascial restricitons, Decreased strength  Visit Diagnosis: Cervicalgia  Other symptoms and signs involving the musculoskeletal system  Myalgia  Abnormal posture       [facility] PT PB G-CODES - 11-04-2016 0833    Functional Assessment Tool Used  clinical judgement, educated on posture, no flare ups   Functional Limitations Carrying, moving and handling objects   Carrying, Moving and Handling Objects Goal Status (E0712) At least 1 percent but less than 20 percent impaired, limited or restricted   Carrying, Moving and Handling Objects Discharge Status [phone]) At least 1 percent but less than 20 percent impaired, limited or restricted      Problem List Patient Active Problem List   Diagnosis Date Noted  . Osteoarthritis of cervical spine 09/15/2016  . Abnormality of gait 11/07/2015  . Dizziness and giddiness 11/07/2015  . Mild cognitive impairment 01/09/2015  . B12 deficiency 07/06/2014  . Leukopenia 05/06/2013  . Memory difficulty 01/05/2013  . OTHER SYMPTOMS INVOLVING HEAD AND NECK 12/04/2008  . HYPERLIPIDEMIA 11/28/2008  . Multiple sclerosis ([place]) 11/28/2008  . INFLAMMATORY BOWEL DISEASE 11/28/2008  . DIVERTICULOSIS, COLON 11/28/2008  . FATTY LIVER DISEASE 11/28/2008  . PERSONAL HISTORY OF PEPTIC ULCER DISEASE 11/28/2008     Laureen Abrahams, PT, DPT Nov 04, 2016 8:36 AM    [place], [place], [address] Phone: [phone]   Fax:  [phone]  Name: CATALAYA GARR MRN: [id number] Date of  Birth: 07-29-1940      PHYSICAL THERAPY DISCHARGE SUMMARY  Visits from Start of Care: 4  Current functional level related to goals / functional outcomes: See above   Remaining deficits: n/a   Education / Equipment: HEP, posture/body mechanics  Plan: Patient agrees to discharge.  Patient goals were met. Patient is being discharged due to meeting the stated rehab goals.  ?????     Laureen Abrahams, PT, DPT 11/07/16 9:19 AM   [place], [place], [address] Phone: [phone]  Fax: [phone]

## 2016-10-21 DIAGNOSIS — W57XXXA Bitten or stung by nonvenomous insect and other nonvenomous arthropods, initial encounter: Secondary | ICD-10-CM | POA: Diagnosis not present

## 2016-10-21 DIAGNOSIS — S0006XA Insect bite (nonvenomous) of scalp, initial encounter: Secondary | ICD-10-CM | POA: Diagnosis not present

## 2016-10-22 ENCOUNTER — Ambulatory Visit: Payer: Medicare HMO | Admitting: Sports Medicine

## 2016-10-28 DIAGNOSIS — R5383 Other fatigue: Secondary | ICD-10-CM | POA: Diagnosis not present

## 2016-10-28 DIAGNOSIS — R0602 Shortness of breath: Secondary | ICD-10-CM | POA: Diagnosis not present

## 2016-10-28 DIAGNOSIS — R1032 Left lower quadrant pain: Secondary | ICD-10-CM | POA: Diagnosis not present

## 2016-10-28 DIAGNOSIS — R252 Cramp and spasm: Secondary | ICD-10-CM | POA: Diagnosis not present

## 2016-10-30 ENCOUNTER — Other Ambulatory Visit: Payer: Self-pay | Admitting: Neurology

## 2016-12-03 DIAGNOSIS — R69 Illness, unspecified: Secondary | ICD-10-CM | POA: Diagnosis not present

## 2016-12-03 DIAGNOSIS — S43429A Sprain of unspecified rotator cuff capsule, initial encounter: Secondary | ICD-10-CM | POA: Diagnosis not present

## 2016-12-03 DIAGNOSIS — E538 Deficiency of other specified B group vitamins: Secondary | ICD-10-CM | POA: Diagnosis not present

## 2016-12-03 DIAGNOSIS — Z79899 Other long term (current) drug therapy: Secondary | ICD-10-CM | POA: Diagnosis not present

## 2016-12-03 DIAGNOSIS — M1990 Unspecified osteoarthritis, unspecified site: Secondary | ICD-10-CM | POA: Diagnosis not present

## 2016-12-03 DIAGNOSIS — D709 Neutropenia, unspecified: Secondary | ICD-10-CM | POA: Diagnosis not present

## 2016-12-03 DIAGNOSIS — E785 Hyperlipidemia, unspecified: Secondary | ICD-10-CM | POA: Diagnosis not present

## 2016-12-03 DIAGNOSIS — E559 Vitamin D deficiency, unspecified: Secondary | ICD-10-CM | POA: Diagnosis not present

## 2016-12-03 DIAGNOSIS — Z0000 Encounter for general adult medical examination without abnormal findings: Secondary | ICD-10-CM | POA: Diagnosis not present

## 2016-12-03 DIAGNOSIS — R634 Abnormal weight loss: Secondary | ICD-10-CM | POA: Diagnosis not present

## 2016-12-09 ENCOUNTER — Other Ambulatory Visit: Payer: Self-pay | Admitting: Neurology

## 2016-12-18 DIAGNOSIS — M8589 Other specified disorders of bone density and structure, multiple sites: Secondary | ICD-10-CM | POA: Diagnosis not present

## 2016-12-23 DIAGNOSIS — N3000 Acute cystitis without hematuria: Secondary | ICD-10-CM | POA: Diagnosis not present

## 2017-01-02 ENCOUNTER — Ambulatory Visit (INDEPENDENT_AMBULATORY_CARE_PROVIDER_SITE_OTHER): Payer: Medicare HMO | Admitting: Neurology

## 2017-01-02 ENCOUNTER — Encounter: Payer: Self-pay | Admitting: Neurology

## 2017-01-02 VITALS — BP 131/77 | HR 82 | Ht 65.5 in | Wt 127.0 lb

## 2017-01-02 DIAGNOSIS — R413 Other amnesia: Secondary | ICD-10-CM

## 2017-01-02 DIAGNOSIS — R269 Unspecified abnormalities of gait and mobility: Secondary | ICD-10-CM

## 2017-01-02 DIAGNOSIS — Z5181 Encounter for therapeutic drug level monitoring: Secondary | ICD-10-CM

## 2017-01-02 DIAGNOSIS — G35 Multiple sclerosis: Secondary | ICD-10-CM | POA: Diagnosis not present

## 2017-01-02 NOTE — Progress Notes (Signed)
Reason for visit: Multiple sclerosis  [name] is an 76 y.o. female  History of present illness:  [name] is a 76 year old left-handed white female with a history of multiple sclerosis currently treated with Gilenya. The patient has tolerated this medication quite well, she is having no side effects. Over the summer she has had several viral illnesses and a urinary tract infection. The patient has not noted any new symptoms of numbness, weakness, gait disturbance, or visual disturbance associated with multiple sclerosis. MRI of the brain evaluation last year showed good stability. The patient does have some chronic issues with fecal incontinence. She returns to this office for further evaluation. She does report a mild memory disturbance, this has not changed much since last seen.  Past Medical History:  Diagnosis Date  . Arthritis, degenerative    both knees  . B12 deficiency 07/06/2014  . Colitis   . Diverticulosis   . Duodenitis   . Fatty liver   . Gastritis   . Helicobacter pylori gastritis   . Hyperlipidemia   . Hypertension   . IBD (inflammatory bowel disease)   . Memory difficulty 01/05/2013  . Memory loss    mild  . Multiple sclerosis ([place])   . Osteopenia   . Vitamin B12 deficiency     Past Surgical History:  Procedure Laterality Date  . ABDOMINAL HYSTERECTOMY  2010  . BLADDER SURGERY  2010   Bladder Tack  . CERVICAL SPINE SURGERY    . CHOLECYSTECTOMY    . HEMORRHOID SURGERY    . ROTATOR CUFF REPAIR    . SPINAL FUSION    . TUBAL LIGATION      Family History  Problem Relation Age of Onset  . Diabetes Father   . Heart disease Father   . Colon polyps Mother   . Breast cancer Cousin   . Colon cancer Maternal Grandmother        ? may be duodenal cancer  . Colon polyps Brother   . Hypertension Brother     Social history:  reports that she quit smoking about 19 years ago. She has never used smokeless tobacco. She reports that she does not drink  alcohol or use drugs.    Allergies  Allergen Reactions  . Vytorin [Ezetimibe-Simvastatin]     White skin patch  . Doxycycline     Raw mouth   . Erythromycin     REACTION: weakness  . Macrobid [Nitrofurantoin Macrocrystal] Hives  . Morphine Nausea And Vomiting  . Sulfonamide Derivatives Hives and Nausea And Vomiting  . Amoxicillin Hives    Medications:  Prior to Admission medications   Medication Sig Start Date End Date Taking? Authorizing Provider  B Complex Vitamins (VITAMIN B COMPLEX PO) Take 1 tablet by mouth daily.    Yes [provider]  cetirizine (ZYRTEC) 10 MG tablet Take 10 mg by mouth daily.   Yes [provider]  Cholecalciferol (VITAMIN D-3) 5000 UNITS TABS Take 5,000 Units by mouth daily.   Yes [provider]  desonide (DESOWEN) 0.05 % lotion Apply 1 application topically daily.   Yes [provider]  Evening Primrose Oil 500 MG CAPS Take 1 capsule by mouth daily.   Yes [provider]  fenofibrate (TRICOR) 145 MG tablet Take 145 mg by mouth daily.   Yes [provider]  gabapentin (NEURONTIN) 600 MG tablet TAKE ONE TABLET BY MOUTH THREE TIMES DAILY 08/07/16  Yes Kathrynn Ducking, MD  GILENYA 0.5  MG CAPS TAKE ONE CAPSULE BY MOUTH DAILY 10/30/16  Yes Kathrynn Ducking, MD  Glucosamine 500 MG TABS Take 1 tablet by mouth 2 (two) times daily.    Yes [provider]  lovastatin (MEVACOR) 40 MG tablet Take 40 mg by mouth 2 (two) times daily.     Yes [provider]  memantine (NAMENDA) 10 MG tablet TAKE ONE TABLET BY MOUTH TWICE DAILY (titrate) 12/09/16  Yes Kathrynn Ducking, MD  Multiple Vitamin (MULTIVITAMIN) tablet Take 0.5 tablets by mouth daily.    Yes [provider]  Probiotic Product (PROBIOTIC FORMULA PO) Take 1 tablet by mouth daily.     Yes [provider]  vitamin B-12 (CYANOCOBALAMIN) 1000 MCG tablet Take 1,000 mcg by mouth daily.     Yes [provider]     ROS:  Out of a complete 14 system review of symptoms, the patient complains only of the following symptoms, and all other reviewed systems are negative.  Decreased appetite, weight reduction Hearing loss, runny nose Heat intolerance Incontinence of the bowels Environmental allergies, frequent infections Numbness  Blood pressure 131/77, pulse 82, height 5' 5.5" (1.664 m), weight 127 lb (57.6 kg).  Physical Exam  General: The patient is alert and cooperative at the time of the examination.  Skin: No significant peripheral edema is noted.   Neurologic Exam  Mental status: The patient is alert and oriented x 3 at the time of the examination. The patient has apparent normal recent and remote memory, with an apparently normal attention span and concentration ability. Mini-Mental Status Examination done today shows a total score of 29/30.  Cranial nerves: Facial symmetry is present. Speech is normal, no aphasia or dysarthria is noted. Extraocular movements are full. Visual fields are full. Pupils are equal, round, and reactive to light. Discs are flat bilaterally.  Motor: The patient has good strength in all 4 extremities.  Sensory examination: Soft touch sensation is symmetric on the face, arms, and legs.  Coordination: The patient has good finger-nose-finger and heel-to-shin bilaterally.  Gait and station: The patient has a normal gait. Tandem gait is normal. Romberg is negative. No drift is seen.  Reflexes: Deep tendon reflexes are symmetric.   Assessment/Plan:  1. Multiple sclerosis  2. Mild memory disturbance  The patient is doing well on Gilenya, we will continue to follow this issue with memory over time. The patient will have blood work done today and she will follow-up in 6 months.  Jill Alexanders MD 01/02/2017 8:04 AM  [facility] [address] [place], [place] [address]  Phone [phone] Fax [phone]

## 2017-01-03 LAB — COMPREHENSIVE METABOLIC PANEL
A/G RATIO: 1.6 (ref 1.2–2.2)
ALBUMIN: 4.2 g/dL (ref 3.5–4.8)
ALT: 9 IU/L (ref 0–32)
AST: 19 IU/L (ref 0–40)
Alkaline Phosphatase: 52 IU/L (ref 39–117)
BILIRUBIN TOTAL: 0.4 mg/dL (ref 0.0–1.2)
BUN / CREAT RATIO: 21 (ref 12–28)
BUN: 18 mg/dL (ref 8–27)
CALCIUM: 9.8 mg/dL (ref 8.7–10.3)
CHLORIDE: 103 mmol/L (ref 96–106)
CO2: 23 mmol/L (ref 20–29)
Creatinine, Ser: 0.85 mg/dL (ref 0.57–1.00)
GFR, EST AFRICAN AMERICAN: 78 mL/min/{1.73_m2} (ref >59)
GFR, EST NON AFRICAN AMERICAN: 67 mL/min/{1.73_m2} (ref >59)
GLOBULIN, TOTAL: 2.6 g/dL (ref 1.5–4.5)
Glucose: 89 mg/dL (ref 65–99)
POTASSIUM: 4 mmol/L (ref 3.5–5.2)
Sodium: 140 mmol/L (ref 134–144)
TOTAL PROTEIN: 6.8 g/dL (ref 6.0–8.5)

## 2017-01-03 LAB — CBC WITH DIFFERENTIAL/PLATELET
BASOS ABS: 0 10*3/uL (ref 0.0–0.2)
BASOS: 1 %
EOS (ABSOLUTE): 0.1 10*3/uL (ref 0.0–0.4)
Eos: 3 %
HEMOGLOBIN: 12.3 g/dL (ref 11.1–15.9)
Hematocrit: 39.5 % (ref 34.0–46.6)
IMMATURE GRANS (ABS): 0 10*3/uL (ref 0.0–0.1)
Immature Granulocytes: 1 %
LYMPHS ABS: 0.4 10*3/uL — AB (ref 0.7–3.1)
Lymphs: 9 %
MCH: 29.6 pg (ref 26.6–33.0)
MCHC: 31.1 g/dL — ABNORMAL LOW (ref 31.5–35.7)
MCV: 95 fL (ref 79–97)
Monocytes Absolute: 0.3 10*3/uL (ref 0.1–0.9)
Monocytes: 8 %
NEUTROS ABS: 3.2 10*3/uL (ref 1.4–7.0)
Neutrophils: 78 %
Platelets: 272 10*3/uL (ref 150–379)
RBC: 4.15 x10E6/uL (ref 3.77–5.28)
RDW: 14 % (ref 12.3–15.4)
WBC: 4 10*3/uL (ref 3.4–10.8)

## 2017-01-14 DIAGNOSIS — D1722 Benign lipomatous neoplasm of skin and subcutaneous tissue of left arm: Secondary | ICD-10-CM | POA: Diagnosis not present

## 2017-01-14 DIAGNOSIS — Z01419 Encounter for gynecological examination (general) (routine) without abnormal findings: Secondary | ICD-10-CM | POA: Diagnosis not present

## 2017-01-14 DIAGNOSIS — B373 Candidiasis of vulva and vagina: Secondary | ICD-10-CM | POA: Diagnosis not present

## 2017-01-14 DIAGNOSIS — M7989 Other specified soft tissue disorders: Secondary | ICD-10-CM | POA: Diagnosis not present

## 2017-01-26 DIAGNOSIS — R69 Illness, unspecified: Secondary | ICD-10-CM | POA: Diagnosis not present

## 2017-02-02 ENCOUNTER — Other Ambulatory Visit: Payer: Self-pay | Admitting: Neurology

## 2017-03-13 DIAGNOSIS — J209 Acute bronchitis, unspecified: Secondary | ICD-10-CM | POA: Diagnosis not present

## 2017-03-13 DIAGNOSIS — N3090 Cystitis, unspecified without hematuria: Secondary | ICD-10-CM | POA: Diagnosis not present

## 2017-03-13 DIAGNOSIS — R399 Unspecified symptoms and signs involving the genitourinary system: Secondary | ICD-10-CM | POA: Diagnosis not present

## 2017-04-01 DIAGNOSIS — N3090 Cystitis, unspecified without hematuria: Secondary | ICD-10-CM | POA: Diagnosis not present

## 2017-04-16 DIAGNOSIS — Z1231 Encounter for screening mammogram for malignant neoplasm of breast: Secondary | ICD-10-CM | POA: Diagnosis not present

## 2017-04-20 ENCOUNTER — Telehealth: Payer: Self-pay | Admitting: Neurology

## 2017-04-20 NOTE — Telephone Encounter (Signed)
Pt called wanting to inform us she has an authorization letter for Adna for the year. Please call to discuss

## 2017-04-22 DIAGNOSIS — R928 Other abnormal and inconclusive findings on diagnostic imaging of breast: Secondary | ICD-10-CM | POA: Diagnosis not present

## 2017-04-22 DIAGNOSIS — N6001 Solitary cyst of right breast: Secondary | ICD-10-CM | POA: Diagnosis not present

## 2017-04-23 ENCOUNTER — Other Ambulatory Visit: Payer: Self-pay | Admitting: Neurology

## 2017-04-29 ENCOUNTER — Other Ambulatory Visit: Payer: Self-pay | Admitting: Radiology

## 2017-04-29 DIAGNOSIS — N6312 Unspecified lump in the right breast, upper inner quadrant: Secondary | ICD-10-CM | POA: Diagnosis not present

## 2017-04-29 DIAGNOSIS — N6311 Unspecified lump in the right breast, upper outer quadrant: Secondary | ICD-10-CM | POA: Diagnosis not present

## 2017-04-29 DIAGNOSIS — N641 Fat necrosis of breast: Secondary | ICD-10-CM | POA: Diagnosis not present

## 2017-04-29 DIAGNOSIS — N61 Mastitis without abscess: Secondary | ICD-10-CM | POA: Diagnosis not present

## 2017-05-04 NOTE — Telephone Encounter (Signed)
Initiated PA Gilenya on covermymeds. PTW:[id number]

## 2017-05-04 NOTE — Telephone Encounter (Signed)
Pt is needing approval for Gilenya. Pt is wanting a call back to discuss and to know when its ben sent

## 2017-05-04 NOTE — Telephone Encounter (Signed)
Received fax notification from [employer] that [place] approved 04/12/2017-04/13/2018. Referral Number: [id number].  Member ID: [id number]

## 2017-05-04 NOTE — Telephone Encounter (Signed)
Called pt, LVM (ok per DPR) that PA Gilenya approved via her insurance. Advised her to call back if she has further questions/concerns.

## 2017-05-04 NOTE — Telephone Encounter (Signed)
Submitted PA Gilenya on covermymeds. Waiting on determination.

## 2017-05-15 ENCOUNTER — Ambulatory Visit
Admission: RE | Admit: 2017-05-15 | Discharge: 2017-05-15 | Disposition: A | Discharge status: Home / Self Care | Payer: Medicare HMO | Source: Ambulatory Visit | Attending: Family Medicine | Admitting: Family Medicine

## 2017-05-15 ENCOUNTER — Other Ambulatory Visit: Payer: Self-pay | Admitting: Family Medicine

## 2017-05-15 DIAGNOSIS — R05 Cough: Secondary | ICD-10-CM | POA: Diagnosis not present

## 2017-05-15 DIAGNOSIS — R059 Cough, unspecified: Secondary | ICD-10-CM

## 2017-05-15 DIAGNOSIS — R634 Abnormal weight loss: Secondary | ICD-10-CM | POA: Diagnosis not present

## 2017-05-15 DIAGNOSIS — Z681 Body mass index (BMI) 19 or less, adult: Secondary | ICD-10-CM | POA: Diagnosis not present

## 2017-05-15 DIAGNOSIS — M799 Soft tissue disorder, unspecified: Secondary | ICD-10-CM | POA: Diagnosis not present

## 2017-05-18 ENCOUNTER — Other Ambulatory Visit: Payer: Self-pay | Admitting: Neurology

## 2017-05-19 ENCOUNTER — Other Ambulatory Visit: Payer: Self-pay | Admitting: Family Medicine

## 2017-05-19 DIAGNOSIS — M7989 Other specified soft tissue disorders: Secondary | ICD-10-CM

## 2017-05-25 ENCOUNTER — Ambulatory Visit
Admission: RE | Admit: 2017-05-25 | Discharge: 2017-05-25 | Disposition: A | Discharge status: Home / Self Care | Payer: Medicare HMO | Source: Ambulatory Visit | Attending: Family Medicine | Admitting: Family Medicine

## 2017-05-25 DIAGNOSIS — D1722 Benign lipomatous neoplasm of skin and subcutaneous tissue of left arm: Secondary | ICD-10-CM | POA: Diagnosis not present

## 2017-05-25 DIAGNOSIS — M7989 Other specified soft tissue disorders: Secondary | ICD-10-CM

## 2017-06-04 DIAGNOSIS — I499 Cardiac arrhythmia, unspecified: Secondary | ICD-10-CM | POA: Diagnosis not present

## 2017-06-04 DIAGNOSIS — R630 Anorexia: Secondary | ICD-10-CM | POA: Diagnosis not present

## 2017-06-04 DIAGNOSIS — Z681 Body mass index (BMI) 19 or less, adult: Secondary | ICD-10-CM | POA: Diagnosis not present

## 2017-06-04 DIAGNOSIS — R0602 Shortness of breath: Secondary | ICD-10-CM | POA: Diagnosis not present

## 2017-06-04 DIAGNOSIS — R634 Abnormal weight loss: Secondary | ICD-10-CM | POA: Diagnosis not present

## 2017-06-04 DIAGNOSIS — R05 Cough: Secondary | ICD-10-CM | POA: Diagnosis not present

## 2017-06-04 DIAGNOSIS — W57XXXA Bitten or stung by nonvenomous insect and other nonvenomous arthropods, initial encounter: Secondary | ICD-10-CM | POA: Diagnosis not present

## 2017-06-04 DIAGNOSIS — D702 Other drug-induced agranulocytosis: Secondary | ICD-10-CM | POA: Diagnosis not present

## 2017-06-09 ENCOUNTER — Other Ambulatory Visit: Payer: Self-pay | Admitting: Family Medicine

## 2017-06-09 DIAGNOSIS — R053 Chronic cough: Secondary | ICD-10-CM

## 2017-06-09 DIAGNOSIS — R05 Cough: Secondary | ICD-10-CM

## 2017-06-10 ENCOUNTER — Encounter (HOSPITAL_BASED_OUTPATIENT_CLINIC_OR_DEPARTMENT_OTHER): Payer: Self-pay

## 2017-06-10 ENCOUNTER — Ambulatory Visit (HOSPITAL_BASED_OUTPATIENT_CLINIC_OR_DEPARTMENT_OTHER)
Admission: RE | Admit: 2017-06-10 | Discharge: 2017-06-10 | Disposition: A | Discharge status: Home / Self Care | Payer: Medicare HMO | Source: Ambulatory Visit | Attending: Family Medicine | Admitting: Family Medicine

## 2017-06-10 ENCOUNTER — Other Ambulatory Visit: Payer: Self-pay | Admitting: Family Medicine

## 2017-06-10 ENCOUNTER — Other Ambulatory Visit (HOSPITAL_BASED_OUTPATIENT_CLINIC_OR_DEPARTMENT_OTHER): Payer: Self-pay | Admitting: Family Medicine

## 2017-06-10 DIAGNOSIS — R05 Cough: Secondary | ICD-10-CM | POA: Insufficient documentation

## 2017-06-10 DIAGNOSIS — I2510 Atherosclerotic heart disease of native coronary artery without angina pectoris: Secondary | ICD-10-CM | POA: Insufficient documentation

## 2017-06-10 DIAGNOSIS — R160 Hepatomegaly, not elsewhere classified: Secondary | ICD-10-CM | POA: Insufficient documentation

## 2017-06-10 DIAGNOSIS — N2889 Other specified disorders of kidney and ureter: Secondary | ICD-10-CM | POA: Diagnosis not present

## 2017-06-10 DIAGNOSIS — R053 Chronic cough: Secondary | ICD-10-CM

## 2017-06-10 DIAGNOSIS — I700 Atherosclerosis of aorta: Secondary | ICD-10-CM | POA: Insufficient documentation

## 2017-06-10 DIAGNOSIS — R634 Abnormal weight loss: Secondary | ICD-10-CM | POA: Diagnosis not present

## 2017-06-10 DIAGNOSIS — E042 Nontoxic multinodular goiter: Secondary | ICD-10-CM | POA: Insufficient documentation

## 2017-06-10 DIAGNOSIS — J439 Emphysema, unspecified: Secondary | ICD-10-CM | POA: Insufficient documentation

## 2017-06-10 DIAGNOSIS — R911 Solitary pulmonary nodule: Secondary | ICD-10-CM | POA: Diagnosis not present

## 2017-06-10 MED ORDER — IOPAMIDOL (ISOVUE-300) INJECTION 61%
100.0000 mL | Freq: Once | INTRAVENOUS | Status: AC | PRN
  Administered 2017-06-10: 80 mL via INTRAVENOUS

## 2017-06-12 DIAGNOSIS — K50918 Crohn's disease, unspecified, with other complication: Secondary | ICD-10-CM | POA: Diagnosis not present

## 2017-06-12 DIAGNOSIS — N2889 Other specified disorders of kidney and ureter: Secondary | ICD-10-CM | POA: Diagnosis not present

## 2017-06-12 DIAGNOSIS — Z87891 Personal history of nicotine dependence: Secondary | ICD-10-CM | POA: Diagnosis not present

## 2017-06-12 DIAGNOSIS — R634 Abnormal weight loss: Secondary | ICD-10-CM | POA: Diagnosis not present

## 2017-06-12 DIAGNOSIS — R630 Anorexia: Secondary | ICD-10-CM | POA: Diagnosis not present

## 2017-06-12 DIAGNOSIS — R197 Diarrhea, unspecified: Secondary | ICD-10-CM | POA: Diagnosis not present

## 2017-06-18 DIAGNOSIS — N2889 Other specified disorders of kidney and ureter: Secondary | ICD-10-CM | POA: Diagnosis not present

## 2017-06-18 DIAGNOSIS — R634 Abnormal weight loss: Secondary | ICD-10-CM | POA: Diagnosis not present

## 2017-06-18 DIAGNOSIS — K50918 Crohn's disease, unspecified, with other complication: Secondary | ICD-10-CM | POA: Diagnosis not present

## 2017-06-22 ENCOUNTER — Other Ambulatory Visit: Payer: Medicare HMO

## 2017-06-30 DIAGNOSIS — N2889 Other specified disorders of kidney and ureter: Secondary | ICD-10-CM | POA: Diagnosis not present

## 2017-06-30 DIAGNOSIS — C679 Malignant neoplasm of bladder, unspecified: Secondary | ICD-10-CM | POA: Diagnosis not present

## 2017-07-03 ENCOUNTER — Other Ambulatory Visit: Payer: Self-pay

## 2017-07-03 ENCOUNTER — Ambulatory Visit: Payer: Medicare HMO | Admitting: Neurology

## 2017-07-03 ENCOUNTER — Encounter: Payer: Self-pay | Admitting: Neurology

## 2017-07-03 ENCOUNTER — Encounter: Payer: Self-pay | Admitting: Urgent Care

## 2017-07-03 ENCOUNTER — Ambulatory Visit (INDEPENDENT_AMBULATORY_CARE_PROVIDER_SITE_OTHER): Payer: Medicare HMO | Admitting: Urgent Care

## 2017-07-03 VITALS — BP 120/66 | HR 91 | Temp 98.1°F | Ht 65.0 in | Wt 115.6 lb

## 2017-07-03 VITALS — BP 117/75 | HR 114 | Ht 65.0 in | Wt 115.0 lb

## 2017-07-03 DIAGNOSIS — G3184 Mild cognitive impairment, so stated: Secondary | ICD-10-CM | POA: Diagnosis not present

## 2017-07-03 DIAGNOSIS — R109 Unspecified abdominal pain: Secondary | ICD-10-CM | POA: Diagnosis not present

## 2017-07-03 DIAGNOSIS — Z8719 Personal history of other diseases of the digestive system: Secondary | ICD-10-CM | POA: Diagnosis not present

## 2017-07-03 DIAGNOSIS — K5730 Diverticulosis of large intestine without perforation or abscess without bleeding: Secondary | ICD-10-CM

## 2017-07-03 DIAGNOSIS — G35 Multiple sclerosis: Secondary | ICD-10-CM | POA: Diagnosis not present

## 2017-07-03 MED ORDER — CIPROFLOXACIN HCL 500 MG PO TABS: 500.0000 mg | ORAL_TABLET | Freq: Two times a day (BID) | ORAL | 0 refills | Status: DC

## 2017-07-03 MED ORDER — METRONIDAZOLE 500 MG PO TABS: 500.0000 mg | ORAL_TABLET | Freq: Three times a day (TID) | ORAL | 0 refills | Status: DC

## 2017-07-03 NOTE — Patient Instructions (Addendum)
Make sure you are hydrating well, eating high fiber diet.    Diverticulitis Diverticulitis is infection or inflammation of small pouches (diverticula) in the colon that form due to a condition called diverticulosis. Diverticula can trap stool (feces) and bacteria, causing infection and inflammation. Diverticulitis may cause severe stomach pain and diarrhea. It may lead to tissue damage in the colon that causes bleeding. The diverticula may also burst (rupture) and cause infected stool to enter other areas of the abdomen. Complications of diverticulitis can include:  Bleeding.  Severe infection.  Severe pain.  Rupture (perforation) of the colon.  Blockage (obstruction) of the colon.  What are the causes? This condition is caused by stool becoming trapped in the diverticula, which allows bacteria to grow in the diverticula. This leads to inflammation and infection. What increases the risk? You are more likely to develop this condition if:  You have diverticulosis. The risk for diverticulosis increases if: ? You are overweight or obese. ? You use tobacco products. ? You do not get enough exercise.  You eat a diet that does not include enough fiber. High-fiber foods include fruits, vegetables, beans, nuts, and whole grains.  What are the signs or symptoms? Symptoms of this condition may include:  Pain and tenderness in the abdomen. The pain is normally located on the left side of the abdomen, but it may occur in other areas.  Fever and chills.  Bloating.  Cramping.  Nausea.  Vomiting.  Changes in bowel routines.  Blood in your stool.  How is this diagnosed? This condition is diagnosed based on:  Your medical history.  A physical exam.  Tests to make sure there is nothing else causing your condition. These tests may include: ? Blood tests. ? Urine tests. ? Imaging tests of the abdomen, including X-rays, ultrasounds, MRIs, or CT scans.  How is this  treated? Most cases of this condition are mild and can be treated at home. Treatment may include:  Taking over-the-counter pain medicines.  Following a clear liquid diet.  Taking antibiotic medicines by mouth.  Rest.  More severe cases may need to be treated at a hospital. Treatment may include:  Not eating or drinking.  Taking prescription pain medicine.  Receiving antibiotic medicines through an IV tube.  Receiving fluids and nutrition through an IV tube.  Surgery.  When your condition is under control, your health care provider may recommend that you have a colonoscopy. This is an exam to look at the entire large intestine. During the exam, a lubricated, bendable tube is inserted into the anus and then passed into the rectum, colon, and other parts of the large intestine. A colonoscopy can show how severe your diverticula are and whether something else may be causing your symptoms. Follow these instructions at home: Medicines  Take over-the-counter and prescription medicines only as told by your health care provider. These include fiber supplements, probiotics, and stool softeners.  If you were prescribed an antibiotic medicine, take it as told by your health care provider. Do not stop taking the antibiotic even if you start to feel better.  Do not drive or use heavy machinery while taking prescription pain medicine. General instructions  Follow a full liquid diet or another diet as directed by your health care provider. After your symptoms improve, your health care provider may tell you to change your diet. He or she may recommend that you eat a diet that contains at least 25 g (25 grams) of fiber daily. Fiber makes it  easier to pass stool. Healthy sources of fiber include: ? Berries. One cup contains 4-8 grams of fiber. ? Beans or lentils. One half cup contains 5-8 grams of fiber. ? Green vegetables. One cup contains 4 grams of fiber.  Exercise for at least 30 minutes, 3  times each week. You should exercise hard enough to raise your heart rate and break a sweat.  Keep all follow-up visits as told by your health care provider. This is important. You may need a colonoscopy. Contact a health care provider if:  Your pain does not improve.  You have a hard time drinking or eating food.  Your bowel movements do not return to normal. Get help right away if:  Your pain gets worse.  Your symptoms do not get better with treatment.  Your symptoms suddenly get worse.  You have a fever.  You vomit more than one time.  You have stools that are bloody, black, or tarry. Summary  Diverticulitis is infection or inflammation of small pouches (diverticula) in the colon that form due to a condition called diverticulosis. Diverticula can trap stool (feces) and bacteria, causing infection and inflammation.  You are at higher risk for this condition if you have diverticulosis and you eat a diet that does not include enough fiber.  Most cases of this condition are mild and can be treated at home. More severe cases may need to be treated at a hospital.  When your condition is under control, your health care provider may recommend that you have an exam called a colonoscopy. This exam can show how severe your diverticula are and whether something else may be causing your symptoms. This information is not intended to replace advice given to you by your health care provider. Make sure you discuss any questions you have with your health care provider. Document Released: 01/08/2005 Document Revised: 05/03/2016 Document Reviewed: 05/03/2016 Elsevier Interactive Patient Education  2018 [employer].     IF you received an x-ray today, you will receive an invoice from [facility]. Please contact [facility] at [phone] with questions or concerns regarding your invoice.   IF you received labwork today, you will receive an invoice from [facility]. Please  contact [facility] at [phone] with questions or concerns regarding your invoice.   Our billing staff will not be able to assist you with questions regarding bills from these companies.  You will be contacted with the lab results as soon as they are available. The fastest way to get your results is to activate your My Chart account. Instructions are located on the last page of this paperwork. If you have not heard from [place] regarding the results in 2 weeks, please contact this office.

## 2017-07-03 NOTE — Progress Notes (Signed)
    MRN: [id number] DOB: Feb 17, 1941  Subjective:   [name] is a 77 y.o. female presenting for acute onset this morning of lower abdominal pain that is reminiscent of her previous diverticulitis pain. Denies fever, n/v, bloody stools, constipation, dysuria, hematuria. Patient just started Megace as she is undergoing treatment for renal cancer. CT abdomen/pelvis from 06/20/2017, shows diverticulosis without diverticulitis.   [name] has a current medication list which includes the following prescription(s): cetirizine, fenofibrate, gabapentin, glucosamine, lovastatin, megestrol, memantine, multivitamin, and bacillus coagulans-inulin. Also is allergic to vytorin [ezetimibe-simvastatin]; doxycycline; erythromycin; macrobid [nitrofurantoin macrocrystal]; morphine; sulfonamide derivatives; and amoxicillin.  [name]  has a past medical history of Arthritis, degenerative, B12 deficiency (07/06/2014), Colitis, Diverticulosis, Duodenitis, Fatty liver, Gastritis, Helicobacter pylori gastritis, Hyperlipidemia, Hypertension, IBD (inflammatory bowel disease), Memory difficulty (01/05/2013), Memory loss, Multiple sclerosis ([place]), Osteopenia, and Vitamin B12 deficiency. Also  has a past surgical history that includes Cholecystectomy; Hemorrhoid surgery; Tubal ligation; Spinal fusion; Rotator cuff repair; Abdominal hysterectomy (2010); Bladder surgery (2010); and Cervical spine surgery.  Objective:   Vitals: BP 120/66 (BP Location: Left Arm, Patient Position: Sitting, Cuff Size: Normal)   Pulse 91   Temp 98.1 F (36.7 C) (Oral)   Ht 5' 5"  (1.651 m)   Wt 115 lb 9.6 oz (52.4 kg)   SpO2 97%   BMI 19.24 kg/m   Physical Exam  Constitutional: She is oriented to person, place, and time. She appears well-developed and well-nourished.  HENT:  Mouth/Throat: Oropharynx is clear and moist.  Cardiovascular: Normal rate, regular rhythm and intact distal pulses. Exam reveals no gallop and no friction rub.  No murmur  heard. Pulmonary/Chest: No respiratory distress. She has no wheezes. She has no rales.  Abdominal: Soft. Bowel sounds are normal. She exhibits no distension and no mass. There is tenderness (mild-moderate over LLQ). There is no rebound and no guarding.  Neurological: She is alert and oriented to person, place, and time.  Skin: Skin is warm and dry.  Psychiatric: She has a normal mood and affect.   Assessment and Plan :   Abdominal pain, unspecified abdominal location  Diverticulosis of colon  History of diverticulitis  Counseled patient on differential, reviewed labs and imaging from recent work up. Discussed differential with patient, she is most concerned about diverticulitis. Provided patient with a script for ciprofloxacin and Flagyl, empiric treatment for diverticulitis given her history and LLQ pain. She will monitor her symptoms and start treatment as instructed. Return-to-clinic precautions discussed, patient verbalized understanding.   Jaynee Eagles, PA-C Primary Care at [facility] [phone] 07/03/2017  4:10 PM

## 2017-07-03 NOTE — Progress Notes (Signed)
Reason for visit: Multiple sclerosis  [name] is an 77 y.o. female  History of present illness:  [name] is a 77 year old left-handed white female with a history of multiple sclerosis and Crohn's disease.  The patient has been on Gilenya, she has tolerated the medication very well, she has not noted any new symptoms associated with her multiple sclerosis.  She denies any visual changes, new numbness or weakness of the face, arms, or legs.  She denies any significant balance changes or difficulty controlling the bowels or the bladder.  She has recently had some unexplained weight loss, she was found to have a mass in the left kidney, but also a smaller mass in the right kidney.  The patient has been followed through oncology at [facility].  She is felt possibly to have a lymphoma, she will be starting chemotherapy in the near future following a biopsy that will be done next week.  The patient is felt to have a stage II cancer.  The patient currently remains on Gilenya.  She returns for an evaluation.  Past Medical History:  Diagnosis Date  . Arthritis, degenerative    both knees  . B12 deficiency 07/06/2014  . Colitis   . Diverticulosis   . Duodenitis   . Fatty liver   . Gastritis   . Helicobacter pylori gastritis   . Hyperlipidemia   . Hypertension   . IBD (inflammatory bowel disease)   . Memory difficulty 01/05/2013  . Memory loss    mild  . Multiple sclerosis ([place])   . Osteopenia   . Vitamin B12 deficiency     Past Surgical History:  Procedure Laterality Date  . ABDOMINAL HYSTERECTOMY  2010  . BLADDER SURGERY  2010   Bladder Tack  . CERVICAL SPINE SURGERY    . CHOLECYSTECTOMY    . HEMORRHOID SURGERY    . ROTATOR CUFF REPAIR    . SPINAL FUSION    . TUBAL LIGATION      Family History  Problem Relation Age of Onset  . Diabetes Father   . Heart disease Father   . Colon polyps Mother   . Breast cancer Cousin   . Colon cancer Maternal Grandmother       ? may be duodenal cancer  . Colon polyps Brother   . Hypertension Brother     Social history:  reports that she quit smoking about 20 years ago. She has never used smokeless tobacco. She reports that she does not drink alcohol or use drugs.    Allergies  Allergen Reactions  . Vytorin [Ezetimibe-Simvastatin]     White skin patch  . Doxycycline     Raw mouth   . Erythromycin     REACTION: weakness  . Macrobid [Nitrofurantoin Macrocrystal] Hives  . Morphine Nausea And Vomiting  . Sulfonamide Derivatives Hives and Nausea And Vomiting  . Amoxicillin Hives    Medications:  Prior to Admission medications   Medication Sig Start Date End Date Taking? Authorizing Provider  cetirizine (ZYRTEC) 10 MG tablet Take 10 mg by mouth daily.   Yes [provider]  desonide (DESOWEN) 0.05 % lotion Apply 1 application topically daily.   Yes [provider]  fenofibrate (TRICOR) 145 MG tablet Take 145 mg by mouth daily.   Yes [provider]  gabapentin (NEURONTIN) 600 MG tablet TAKE ONE TABLET BY MOUTH THREE TIMES A DAY 05/18/17  Yes Kathrynn Ducking, MD  GILENYA 0.5 MG CAPS TAKE ONE  CAPSULE BY MOUTH DAILY 10/30/16  Yes Kathrynn Ducking, MD  Glucosamine 500 MG TABS Take 1 tablet by mouth 2 (two) times daily.    Yes [provider]  lovastatin (MEVACOR) 40 MG tablet Take 40 mg by mouth 2 (two) times daily.     Yes [provider]  megestrol (MEGACE) 40 MG tablet Take by mouth. 06/30/17 12/27/17 Yes [provider]  memantine (NAMENDA) 10 MG tablet TAKE ONE TABLET BY MOUTH TWICE DAILY (titrate) 02/02/17  Yes Kathrynn Ducking, MD  Multiple Vitamin (MULTIVITAMIN) tablet Take 0.5 tablets by mouth daily.    Yes [provider]  Probiotic Product (PROBIOTIC FORMULA PO) Take 1 tablet by mouth daily.     Yes [provider]    ROS:  Out of a complete 14 system review of symptoms, the patient complains only of the following symptoms,  and all other reviewed systems are negative.  Fatigue Weight loss  Blood pressure 117/75, pulse (!) 114, height 5' 5"  (1.651 m), weight 115 lb (52.2 kg).  Physical Exam  General: The patient is alert and cooperative at the time of the examination.  Skin: No significant peripheral edema is noted.   Neurologic Exam  Mental status: The patient is alert and oriented x 3 at the time of the examination. The patient has apparent normal recent and remote memory, with an apparently normal attention span and concentration ability.   Cranial nerves: Facial symmetry is present. Speech is normal, no aphasia or dysarthria is noted. Extraocular movements are full. Visual fields are full.  Pupils are equal, round, and reactive to light.  Discs are flat bilaterally.  Motor: The patient has good strength in all 4 extremities.  Sensory examination: Soft touch sensation is symmetric on the face, arms, and legs.  Coordination: The patient has good finger-nose-finger and heel-to-shin bilaterally.  Gait and station: The patient has a normal gait. Tandem gait is normal. Romberg is negative. No drift is seen.  Reflexes: Deep tendon reflexes are symmetric.   Assessment/Plan:  1.  Multiple sclerosis  2.  Cancer, possible lymphoma  The patient will be entering into chemotherapy in the near future.  The patient will stop Gilenya at this time.  We will plan on not using a disease modifying agent while the patient is in chemotherapy.  We may consider starting another medication following chemotherapy treatments.  The patient will follow-up in 6 months, she may be seen sooner if needed.  Jill Alexanders MD 07/03/2017 8:19 AM  [facility] [address] [place], [place] [address]  Phone [phone] Fax [phone]

## 2017-07-10 DIAGNOSIS — C8339 Diffuse large B-cell lymphoma, extranodal and solid organ sites: Secondary | ICD-10-CM | POA: Diagnosis not present

## 2017-07-10 DIAGNOSIS — N2889 Other specified disorders of kidney and ureter: Secondary | ICD-10-CM | POA: Diagnosis not present

## 2017-07-16 DIAGNOSIS — C8590 Non-Hodgkin lymphoma, unspecified, unspecified site: Secondary | ICD-10-CM | POA: Diagnosis not present

## 2017-07-16 DIAGNOSIS — N2889 Other specified disorders of kidney and ureter: Secondary | ICD-10-CM | POA: Diagnosis not present

## 2017-07-22 DIAGNOSIS — C8590 Non-Hodgkin lymphoma, unspecified, unspecified site: Secondary | ICD-10-CM | POA: Diagnosis not present

## 2017-07-30 DIAGNOSIS — Z1159 Encounter for screening for other viral diseases: Secondary | ICD-10-CM | POA: Diagnosis not present

## 2017-07-30 DIAGNOSIS — Z87891 Personal history of nicotine dependence: Secondary | ICD-10-CM | POA: Diagnosis not present

## 2017-07-30 DIAGNOSIS — C8339 Diffuse large B-cell lymphoma, extranodal and solid organ sites: Secondary | ICD-10-CM | POA: Diagnosis not present

## 2017-08-03 DIAGNOSIS — R791 Abnormal coagulation profile: Secondary | ICD-10-CM | POA: Diagnosis not present

## 2017-08-04 DIAGNOSIS — C8330 Diffuse large B-cell lymphoma, unspecified site: Secondary | ICD-10-CM | POA: Diagnosis not present

## 2017-08-04 DIAGNOSIS — C8339 Diffuse large B-cell lymphoma, extranodal and solid organ sites: Secondary | ICD-10-CM | POA: Diagnosis not present

## 2017-08-06 ENCOUNTER — Telehealth: Payer: Self-pay | Admitting: Neurology

## 2017-08-06 DIAGNOSIS — C8339 Diffuse large B-cell lymphoma, extranodal and solid organ sites: Secondary | ICD-10-CM | POA: Diagnosis not present

## 2017-08-06 DIAGNOSIS — Z5112 Encounter for antineoplastic immunotherapy: Secondary | ICD-10-CM | POA: Diagnosis not present

## 2017-08-06 DIAGNOSIS — Z5111 Encounter for antineoplastic chemotherapy: Secondary | ICD-10-CM | POA: Diagnosis not present

## 2017-08-06 DIAGNOSIS — G459 Transient cerebral ischemic attack, unspecified: Secondary | ICD-10-CM

## 2017-08-06 DIAGNOSIS — G35 Multiple sclerosis: Secondary | ICD-10-CM

## 2017-08-06 NOTE — Telephone Encounter (Signed)
Error

## 2017-08-06 NOTE — Telephone Encounter (Signed)
I called the patient.  The patient indicates that she began having some numbness and weakness of the left leg that began to 3 weeks ago, but the patient has had intermittent episodes of more significant numbness and foot drop on the left that will come and go in a few minutes and then resolve.  This episodes are somewhat atypical for MS issue, I would be concerned about cerebrovascular disease.  The patient will be set up for MRI of the brain and a carotid Doppler study.  The patient has been confirmed to have B-cell lymphoma.  It sounds like she may be getting intrathecal chemotherapy treatments as well.  She will be going on steroids prior to her chemotherapy.

## 2017-08-06 NOTE — Addendum Note (Signed)
Addended by: Kathrynn Ducking on: 08/06/2017 09:27 AM   Modules accepted: Orders

## 2017-08-06 NOTE — Telephone Encounter (Signed)
[employer] medicare order sent to [facility]. They will obtain the auth and will reach out to the pt to schedule.

## 2017-08-06 NOTE — Telephone Encounter (Signed)
Pt has developed drop foot , her ability to flex her foot has been noticed for a few days.  Pt said hours after having port installed for chemo  4 hours later she experienced it again.  Pt states after a few mins. It corrects itself.  Pt is asking for a call to discuss and to know what she needs to relay to the chemo staff.  Today is her first chemo appointment.  Please call, if unable to reach pt on her mobile she Is asking her husband be called at [phone]

## 2017-08-10 ENCOUNTER — Ambulatory Visit (HOSPITAL_COMMUNITY)
Admission: RE | Admit: 2017-08-10 | Discharge: 2017-08-10 | Disposition: A | Discharge status: Home / Self Care | Payer: Medicare HMO | Source: Ambulatory Visit | Attending: Neurology | Admitting: Neurology

## 2017-08-10 ENCOUNTER — Telehealth: Payer: Self-pay | Admitting: Neurology

## 2017-08-10 DIAGNOSIS — I6523 Occlusion and stenosis of bilateral carotid arteries: Secondary | ICD-10-CM | POA: Insufficient documentation

## 2017-08-10 DIAGNOSIS — C8510 Unspecified B-cell lymphoma, unspecified site: Secondary | ICD-10-CM | POA: Diagnosis not present

## 2017-08-10 DIAGNOSIS — G35 Multiple sclerosis: Secondary | ICD-10-CM | POA: Insufficient documentation

## 2017-08-10 DIAGNOSIS — G459 Transient cerebral ischemic attack, unspecified: Secondary | ICD-10-CM | POA: Insufficient documentation

## 2017-08-10 NOTE — Telephone Encounter (Signed)
I called the patient.  The carotid Doppler study is unremarkable, MRI of the brain is pending, I will call the patient went to get this report.    Carotid doppler study 08/10/17:  Final Interpretation: Right Carotid: Velocities in the right ICA are consistent with a 1-39% stenosis.  Left Carotid: Velocities in the left ICA are consistent with a 1-39% stenosis.  Vertebrals: Bilateral vertebral arteries demonstrate antegrade flow. Subclavians: Normal flow hemodynamics were seen in bilateral subclavian       arteries.

## 2017-08-10 NOTE — Progress Notes (Signed)
VASCULAR LAB PRELIMINARY  PRELIMINARY  PRELIMINARY  PRELIMINARY  Carotid duplex completed.    Preliminary report:  1-39% ICA plaquing. Vertebral artery flow is antegrade.   [name], RVT 08/10/2017, 10:38 AM

## 2017-08-20 ENCOUNTER — Ambulatory Visit
Admission: RE | Admit: 2017-08-20 | Discharge: 2017-08-20 | Disposition: A | Discharge status: Home / Self Care | Payer: Medicare HMO | Source: Ambulatory Visit | Attending: Neurology | Admitting: Neurology

## 2017-08-20 DIAGNOSIS — G459 Transient cerebral ischemic attack, unspecified: Secondary | ICD-10-CM | POA: Diagnosis not present

## 2017-08-20 DIAGNOSIS — G35 Multiple sclerosis: Secondary | ICD-10-CM

## 2017-08-20 MED ORDER — GADOBENATE DIMEGLUMINE 529 MG/ML IV SOLN
10.0000 mL | Freq: Once | INTRAVENOUS | Status: AC | PRN
  Administered 2017-08-20: 10 mL via INTRAVENOUS

## 2017-08-20 NOTE — Telephone Encounter (Signed)
[employer] Medicare Josem Kaufmann: [id number] (exp. 08/19/17 to 11/17/17) patient is scheduled at GI for 08/20/17.

## 2017-08-21 ENCOUNTER — Telehealth: Payer: Self-pay | Admitting: Neurology

## 2017-08-21 NOTE — Telephone Encounter (Signed)
   I called the patient.  MRI of the brain was stable, very minimal white matter changes are seen, do not appear to be typical for multiple sclerosis.  Some cortical atrophy is seen.  The patient will be coming off of Gilenya when she enters into chemotherapy for her lymphoma.   MRI brain 08/21/17:  MPRESSION: Abnormal MRI scan of the brain showing mild changes of chronic microvascular ischemia and generalized cerebral atrophy.  There are incidental changes of chronic paranasal sinus inflammation with a large mucous retention cyst/polyp in the left maxillary antrum.  No enhancing lesions are noted.  Overall no significant change compared with previous MRI dated 11/19/2015

## 2017-08-22 ENCOUNTER — Ambulatory Visit: Payer: Medicare HMO | Admitting: Physician Assistant

## 2017-08-27 DIAGNOSIS — C8339 Diffuse large B-cell lymphoma, extranodal and solid organ sites: Secondary | ICD-10-CM | POA: Diagnosis not present

## 2017-08-27 DIAGNOSIS — K1230 Oral mucositis (ulcerative), unspecified: Secondary | ICD-10-CM | POA: Diagnosis not present

## 2017-08-27 DIAGNOSIS — G4700 Insomnia, unspecified: Secondary | ICD-10-CM | POA: Diagnosis not present

## 2017-08-27 DIAGNOSIS — R5383 Other fatigue: Secondary | ICD-10-CM | POA: Diagnosis not present

## 2017-08-27 DIAGNOSIS — Z5111 Encounter for antineoplastic chemotherapy: Secondary | ICD-10-CM | POA: Diagnosis not present

## 2017-08-27 DIAGNOSIS — Z87891 Personal history of nicotine dependence: Secondary | ICD-10-CM | POA: Diagnosis not present

## 2017-09-17 DIAGNOSIS — G4700 Insomnia, unspecified: Secondary | ICD-10-CM | POA: Diagnosis not present

## 2017-09-17 DIAGNOSIS — R5383 Other fatigue: Secondary | ICD-10-CM | POA: Diagnosis not present

## 2017-09-17 DIAGNOSIS — C8339 Diffuse large B-cell lymphoma, extranodal and solid organ sites: Secondary | ICD-10-CM | POA: Diagnosis not present

## 2017-09-17 DIAGNOSIS — R634 Abnormal weight loss: Secondary | ICD-10-CM | POA: Diagnosis not present

## 2017-09-17 DIAGNOSIS — Z87891 Personal history of nicotine dependence: Secondary | ICD-10-CM | POA: Diagnosis not present

## 2017-09-17 DIAGNOSIS — R630 Anorexia: Secondary | ICD-10-CM | POA: Diagnosis not present

## 2017-09-17 DIAGNOSIS — Z5111 Encounter for antineoplastic chemotherapy: Secondary | ICD-10-CM | POA: Diagnosis not present

## 2017-09-17 DIAGNOSIS — Z5112 Encounter for antineoplastic immunotherapy: Secondary | ICD-10-CM | POA: Diagnosis not present

## 2017-09-17 DIAGNOSIS — Z681 Body mass index (BMI) 19 or less, adult: Secondary | ICD-10-CM | POA: Diagnosis not present

## 2017-09-17 DIAGNOSIS — M21372 Foot drop, left foot: Secondary | ICD-10-CM | POA: Diagnosis not present

## 2017-09-21 DIAGNOSIS — H6122 Impacted cerumen, left ear: Secondary | ICD-10-CM | POA: Diagnosis not present

## 2017-09-30 DIAGNOSIS — H18423 Band keratopathy, bilateral: Secondary | ICD-10-CM | POA: Diagnosis not present

## 2017-09-30 DIAGNOSIS — H43393 Other vitreous opacities, bilateral: Secondary | ICD-10-CM | POA: Diagnosis not present

## 2017-09-30 DIAGNOSIS — H43813 Vitreous degeneration, bilateral: Secondary | ICD-10-CM | POA: Diagnosis not present

## 2017-09-30 DIAGNOSIS — H2513 Age-related nuclear cataract, bilateral: Secondary | ICD-10-CM | POA: Diagnosis not present

## 2017-09-30 DIAGNOSIS — Z79899 Other long term (current) drug therapy: Secondary | ICD-10-CM | POA: Diagnosis not present

## 2017-09-30 DIAGNOSIS — H04123 Dry eye syndrome of bilateral lacrimal glands: Secondary | ICD-10-CM | POA: Diagnosis not present

## 2017-09-30 DIAGNOSIS — G35 Multiple sclerosis: Secondary | ICD-10-CM | POA: Diagnosis not present

## 2017-10-05 DIAGNOSIS — C8339 Diffuse large B-cell lymphoma, extranodal and solid organ sites: Secondary | ICD-10-CM | POA: Diagnosis not present

## 2017-10-06 DIAGNOSIS — M5136 Other intervertebral disc degeneration, lumbar region: Secondary | ICD-10-CM | POA: Diagnosis not present

## 2017-10-06 DIAGNOSIS — M21372 Foot drop, left foot: Secondary | ICD-10-CM | POA: Diagnosis not present

## 2017-10-06 DIAGNOSIS — M5126 Other intervertebral disc displacement, lumbar region: Secondary | ICD-10-CM | POA: Diagnosis not present

## 2017-10-06 DIAGNOSIS — C8339 Diffuse large B-cell lymphoma, extranodal and solid organ sites: Secondary | ICD-10-CM | POA: Diagnosis not present

## 2017-10-06 DIAGNOSIS — M48061 Spinal stenosis, lumbar region without neurogenic claudication: Secondary | ICD-10-CM | POA: Diagnosis not present

## 2017-10-08 DIAGNOSIS — Z5111 Encounter for antineoplastic chemotherapy: Secondary | ICD-10-CM | POA: Diagnosis not present

## 2017-10-08 DIAGNOSIS — C8339 Diffuse large B-cell lymphoma, extranodal and solid organ sites: Secondary | ICD-10-CM | POA: Diagnosis not present

## 2017-10-08 DIAGNOSIS — Z87891 Personal history of nicotine dependence: Secondary | ICD-10-CM | POA: Diagnosis not present

## 2017-10-08 DIAGNOSIS — G4700 Insomnia, unspecified: Secondary | ICD-10-CM | POA: Diagnosis not present

## 2017-10-08 DIAGNOSIS — M21372 Foot drop, left foot: Secondary | ICD-10-CM | POA: Diagnosis not present

## 2017-10-08 DIAGNOSIS — Z5112 Encounter for antineoplastic immunotherapy: Secondary | ICD-10-CM | POA: Diagnosis not present

## 2017-10-09 ENCOUNTER — Other Ambulatory Visit: Payer: Self-pay | Admitting: Neurology

## 2017-10-29 DIAGNOSIS — Z5112 Encounter for antineoplastic immunotherapy: Secondary | ICD-10-CM | POA: Diagnosis not present

## 2017-10-29 DIAGNOSIS — M5126 Other intervertebral disc displacement, lumbar region: Secondary | ICD-10-CM | POA: Diagnosis not present

## 2017-10-29 DIAGNOSIS — R160 Hepatomegaly, not elsewhere classified: Secondary | ICD-10-CM | POA: Diagnosis not present

## 2017-10-29 DIAGNOSIS — C8339 Diffuse large B-cell lymphoma, extranodal and solid organ sites: Secondary | ICD-10-CM | POA: Diagnosis not present

## 2017-10-29 DIAGNOSIS — M48061 Spinal stenosis, lumbar region without neurogenic claudication: Secondary | ICD-10-CM | POA: Diagnosis not present

## 2017-10-29 DIAGNOSIS — G4700 Insomnia, unspecified: Secondary | ICD-10-CM | POA: Diagnosis not present

## 2017-10-29 DIAGNOSIS — M21372 Foot drop, left foot: Secondary | ICD-10-CM | POA: Diagnosis not present

## 2017-10-29 DIAGNOSIS — Z5111 Encounter for antineoplastic chemotherapy: Secondary | ICD-10-CM | POA: Diagnosis not present

## 2017-10-29 DIAGNOSIS — R200 Anesthesia of skin: Secondary | ICD-10-CM | POA: Diagnosis not present

## 2017-10-29 DIAGNOSIS — M21371 Foot drop, right foot: Secondary | ICD-10-CM | POA: Diagnosis not present

## 2017-11-19 DIAGNOSIS — Z87891 Personal history of nicotine dependence: Secondary | ICD-10-CM | POA: Diagnosis not present

## 2017-11-19 DIAGNOSIS — C8339 Diffuse large B-cell lymphoma, extranodal and solid organ sites: Secondary | ICD-10-CM | POA: Diagnosis not present

## 2017-11-19 DIAGNOSIS — M21372 Foot drop, left foot: Secondary | ICD-10-CM | POA: Diagnosis not present

## 2017-11-19 DIAGNOSIS — R198 Other specified symptoms and signs involving the digestive system and abdomen: Secondary | ICD-10-CM | POA: Diagnosis not present

## 2017-11-19 DIAGNOSIS — G4700 Insomnia, unspecified: Secondary | ICD-10-CM | POA: Diagnosis not present

## 2017-11-19 DIAGNOSIS — Z5111 Encounter for antineoplastic chemotherapy: Secondary | ICD-10-CM | POA: Diagnosis not present

## 2017-11-19 DIAGNOSIS — T451X5A Adverse effect of antineoplastic and immunosuppressive drugs, initial encounter: Secondary | ICD-10-CM | POA: Diagnosis not present

## 2017-11-19 DIAGNOSIS — K521 Toxic gastroenteritis and colitis: Secondary | ICD-10-CM | POA: Diagnosis not present

## 2017-11-21 NOTE — Progress Notes (Addendum)
[place] at [facility] [address] [place], [place] [address] [phone]  Date:  11/23/2017   Name:  [name]   DOB:  1940/06/07   MRN:  [id number]  PCP:  Darreld Mclean, MD    Chief Complaint: New Patient (Initial Visit) (establish care, medication refills, fasting labs); Chrons Disease; Multiple Sclerosis; and Lymphoma (bilateral kidneys, chemo patient)   History of Present Illness:  [name] is a 77 y.o. very pleasant female patient who presents with the following:  Here today as a new patient to establish care. 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   She has mild Crohn's disease She has MS dx in 2000. She was on Gilenya but this was stopped for her chemo, they may start back up on a different med later She was dx with lymphoma just earlier this year, she is being treated at [facility]- her main oncologist is Dr. Mina Marble.  Per her most recent note dated 8/8; .  [name] is a 77 y.o. 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 She had been steadily losing weight unintentionally for about 1 year. She used to weigh 138 pounds, but weighed about 110 pounds on initial presentation. She had early satiety and anorexia. She also feels chronically fatigued. She presented to her primary care physician that something was wrong, but she feels that an evaluation was not pursued soon enough. She also had a nonproductive cough and so a CT chest was performed on June 10, 2017 which incidentally revealed bilateral renal  masses. CT chest abdomen and pelvis performed on June 18, 2017 showed heterogeneous enhancing mass arising from the left upper pole and infiltrating the left renal parenchyma measuring approximately 6.8 x 8.4 x 12.3 cm. But there were numerous lesions noted in both kidneys. She was seen by a Dealer at [facility] and was referred to an oncologist. She came to [facility] to seek a second opinion in Urology. Left renal biopsy performed on March 29 was concerning for a lymphoproliferative disorder, and pathology review shows diffuse large B cell lymphoma, non GCB subtype.   When I first met [name] on July 16, 2017, the pathology was still pending. At that time, she reported new onset of constipation and increased lassitude, causing her to be lying down or sitting ~70% of the day. Two to three months prior, she was doing yoga several times a week. Due to decreased appetite, she started taking Megace and CBD oil which was helping. On August 06, 2017 she started treatment with R-CHOP. She experienced right foot drop after her port placement and therefore an MRI brain was performed but was unrevealing. She tolerated treatment well and felt energetic for several days after chemotherapy. She felt fatigued about 1 week afterwards. She developed white bumps on her tongue which finally cleared up on their own. She also had mucositis. After one cycle of treatment, she reported cold sensitivity and continued to lose a little weight, although her appetite was improved.  On May 16, she completed two cycles of R-CHOP and intrathecal chemotherapy was given. CSF analysis showed normal chemistries and revealed small lymphocytes which favor a benign process.   Ahead of cycle 3, she had another episode of left foot drop and left leg numbness. PET/CT scan performed on June 24 after three cycles showed treatment response ([place] 3). MRI of the lumbar spine on June 25 showed a small left disc extrusion at L2-L3 level and mild spinal canal  stenosis. There is mild multilevel degenerative disc disease.   Since last seen on October 29, 2017, she has generally been all right. She has not had recurrent foot drop. She has diarrhea associated with chemotherapy, and abdominal discomfort.     She had a chemo round last week and she seems to be doing well She will have a PET scan coming up next month and we hope that her treatment will be done  She will take prednisone for a few days around the time of her chemo infusions  Her neurologist is Dr. Jannifer Franklin- she has not seen him in a while but did have an MRI of her brain and a carotid US when she developed foot drop associated with a chemo infusion Her Crohn's disease is really not active Her GYN is Dr. Ulanda Edison  Her lymphoma was dx due to weight loss and loss of energy.   Her cancer is in both of her kidneys They have not mentioned radiation at all yet   She feels like her mood is doing pretty well, she tries "not to have too many pity parties" Her husband agrees hat her mood has been ok considering  Her energy level will wax and wane around her infusions  Operations: She did have her gallbladder out 40 years ago Bladder tack/ hyst And cervical spine fusion done in 1999- this was helpful to her   She quit smoking in 1999  She would like to have a cholesterol check- she is on lovastatin and fenofibrate as well She is fasting today  I noted some irregular heart beats today- on questioning pt will sometimes notice her heart seeming to "Pound" following her infusion , but has not noted any irregularity herself.  Also has not noted any chest pain or pressure She may feel SOB as part of her general fatigue following an infusion but this tends to get better as time passes after her infusion.    Patient Active Problem List   Diagnosis Date Noted  . Osteoarthritis of cervical spine 09/15/2016  . Abnormality of gait 11/07/2015  . Dizziness and giddiness 11/07/2015  . Mild cognitive  impairment 01/09/2015  . B12 deficiency 07/06/2014  . Leukopenia 05/06/2013  . Memory difficulty 01/05/2013  . HYPERLIPIDEMIA 11/28/2008  . Multiple sclerosis ([place]) 11/28/2008  . INFLAMMATORY BOWEL DISEASE 11/28/2008  . DIVERTICULOSIS, COLON 11/28/2008  . FATTY LIVER DISEASE 11/28/2008  . PERSONAL HISTORY OF PEPTIC ULCER DISEASE 11/28/2008    Past Medical History:  Diagnosis Date  . Arthritis, degenerative    both knees  . B12 deficiency 07/06/2014  . Colitis   . Diverticulosis   . Duodenitis   . Fatty liver   . Gastritis   . Helicobacter pylori gastritis   . Hyperlipidemia   . Hypertension    not treated  . IBD (inflammatory bowel disease)   . Memory difficulty 01/05/2013  . Memory loss    mild  . Multiple sclerosis ([place])   . Osteopenia   . Vitamin B12 deficiency  Past Surgical History:  Procedure Laterality Date  . ABDOMINAL HYSTERECTOMY  2010  . BLADDER SURGERY  2010   Bladder Tack  . CERVICAL SPINE SURGERY    . CHOLECYSTECTOMY    . HEMORRHOID SURGERY    . ROTATOR CUFF REPAIR    . SPINAL FUSION    . TUBAL LIGATION      Social History   Tobacco Use  . Smoking status: Former Smoker    Last attempt to quit: 06/27/1997    Years since quitting: 20.4  . Smokeless tobacco: Never Used  Substance Use Topics  . Alcohol use: No  . Drug use: No    Family History  Problem Relation Age of Onset  . Diabetes Father   . Heart disease Father   . Colon polyps Mother   . Breast cancer Cousin   . Colon cancer Maternal Grandmother        ? may be duodenal cancer  . Colon polyps Brother   . Hypertension Brother     Allergies  Allergen Reactions  . Sulfa Antibiotics Hives and Nausea And Vomiting  . Vytorin [Ezetimibe-Simvastatin]     White skin patch  . Cephalexin   . Doxycycline     Raw mouth   . Erythromycin     REACTION: weakness  . Macrobid [Nitrofurantoin Macrocrystal] Hives  . Morphine Nausea And Vomiting  . Sulfonamide Derivatives Hives and Nausea  And Vomiting  . Amoxicillin Hives    Medication list has been reviewed and updated.  Current Outpatient Medications on File Prior to Visit  Medication Sig Dispense Refill  . b complex vitamins tablet Take 1 tablet by mouth daily.    . cetirizine (ZYRTEC) 10 MG tablet Take 10 mg by mouth daily.    . Cholecalciferol (VITAMIN D3) 5000 units TABS Take by mouth.    . desonide (DESOWEN) 0.05 % cream Apply topically 2 (two) times daily.    . fluticasone (FLONASE) 50 MCG/ACT nasal spray Place into both nostrils daily.    [name] Kitchen gabapentin (NEURONTIN) 600 MG tablet TAKE ONE TABLET BY MOUTH THREE TIMES A DAY 270 tablet 0  . Glucosamine 500 MG TABS Take 1 tablet by mouth 2 (two) times daily.     [name] Kitchen LORazepam (ATIVAN) 1 MG tablet Take 1 mg by mouth every 12 (twelve) hours as needed for anxiety.    . megestrol (MEGACE) 40 MG tablet Take by mouth.    . memantine (NAMENDA) 10 MG tablet TAKE ONE TABLET BY MOUTH TWICE A DAY 180 tablet 3  . Multiple Vitamin (MULTIVITAMIN) tablet Take 0.5 tablets by mouth daily.     . pegfilgrastim (NEULASTA ONPRO KIT) 6 MG/0.6ML injection Inject 6 mg into the skin once. Inject via provided programmed delivery device.    . predniSONE (DELTASONE) 50 MG tablet Take 50 mg by mouth 4 (four) times daily.    . Probiotic Product (ALIGN PO) Take by mouth daily.    . Probiotic Product (PROBIOTIC FORMULA PO) Take 1 tablet by mouth daily.      . valACYclovir (VALTREX) 500 MG tablet Take 500 mg by mouth daily.     No current facility-administered medications on file prior to visit.     Review of Systems:  As per HPI- otherwise negative.    Physical Examination: Vitals:   11/23/17 0855  BP: 122/70  Pulse: (!) 107  Resp: 16  Temp: 97.6 F (36.4 C)  SpO2: 98%   Vitals:   11/23/17 0855  Weight: 125 lb (56.7  kg)  Height: _0  (1.651 m)   Body mass index is 20.8 kg/m. Ideal Body Weight: Weight in (lb) to have BMI = 25: 149.9  GEN: WDWN, NAD, Non-toxic, A & O x 3, looks  well, does have total alopecia due to recent chemo HEENT: Atraumatic, Normocephalic. Neck supple. No masses, No LAD.  Bilateral TM wnl, oropharynx normal.  PEERL,EOMI.   Ears and Nose: No external deformity. CV:  Some irregular beats and tachycardia, No M/G/R. No JVD. No thrill. No extra heart sounds. PULM: CTA B, no wheezes, crackles, rhonchi. No retractions. No resp. distress. No accessory muscle use. ABD: S, NT, ND. No rebound. No HSM. EXTR: No c/c/e NEURO Normal gait.  PSYCH: Normally interactive. Conversant. Not depressed or anxious appearing.  Calm demeanor.   EKG: mild sinus tach with rate of 101, she is having PACs Assessment and Plan: Hyperlipidemia, unspecified hyperlipidemia type - Plan: fenofibrate (TRICOR) 145 MG tablet, lovastatin (MEVACOR) 40 MG tablet, Lipid panel  Itchy skin - Plan: desonide (DESOWEN) 0.05 % lotion  Irregularly irregular pulse rhythm - Plan: EKG 12-Lead  Encounter for medical examination to establish care  Multiple sclerosis ([place])  Lymphoma of solid organ excluding spleen, unspecified lymphoma type [facility])  Establishing care today Refilled her cholesterol meds and will check a lipid panel for her today Irregular HR due to PACs noted on EKG today She is asked to monitor for any CP or worsening SOB and she will do so   Signed Lamar Blinks, MD  Received her cholesterol panel, message to pt  Results for orders placed or performed in visit on 11/23/17  Lipid panel  Result Value Ref Range   Cholesterol 90 0 - 200 mg/dL   Triglycerides 131.0 0.0 - 149.0 mg/dL   HDL 39.70 >39.00 mg/dL   VLDL 26.2 0.0 - 40.0 mg/dL   LDL Cholesterol 24 0 - 99 mg/dL   Total CHOL/HDL Ratio 2    NonHDL 50.08

## 2017-11-23 ENCOUNTER — Encounter: Payer: Self-pay | Admitting: Family Medicine

## 2017-11-23 ENCOUNTER — Ambulatory Visit (INDEPENDENT_AMBULATORY_CARE_PROVIDER_SITE_OTHER): Payer: Medicare HMO | Admitting: Family Medicine

## 2017-11-23 VITALS — BP 122/70 | HR 107 | Temp 97.6°F | Resp 16 | Ht 65.0 in | Wt 125.0 lb

## 2017-11-23 DIAGNOSIS — Z0000 Encounter for general adult medical examination without abnormal findings: Secondary | ICD-10-CM

## 2017-11-23 DIAGNOSIS — I499 Cardiac arrhythmia, unspecified: Secondary | ICD-10-CM

## 2017-11-23 DIAGNOSIS — L299 Pruritus, unspecified: Secondary | ICD-10-CM | POA: Diagnosis not present

## 2017-11-23 DIAGNOSIS — C8599 Non-Hodgkin lymphoma, unspecified, extranodal and solid organ sites: Secondary | ICD-10-CM | POA: Diagnosis not present

## 2017-11-23 DIAGNOSIS — E785 Hyperlipidemia, unspecified: Secondary | ICD-10-CM | POA: Diagnosis not present

## 2017-11-23 DIAGNOSIS — G35 Multiple sclerosis: Secondary | ICD-10-CM | POA: Diagnosis not present

## 2017-11-23 LAB — LIPID PANEL
CHOLESTEROL: 90 mg/dL (ref 0–200)
HDL: 39.7 mg/dL (ref >39.00)
LDL CALC: 24 mg/dL (ref 0–99)
NonHDL: 50.08
TRIGLYCERIDES: 131 mg/dL (ref 0.0–149.0)
Total CHOL/HDL Ratio: 2
VLDL: 26.2 mg/dL (ref 0.0–40.0)

## 2017-11-23 MED ORDER — FENOFIBRATE 145 MG PO TABS: 145.0000 mg | ORAL_TABLET | Freq: Every day | ORAL | 3 refills | Status: DC

## 2017-11-23 MED ORDER — LOVASTATIN 40 MG PO TABS: 40.0000 mg | ORAL_TABLET | Freq: Two times a day (BID) | ORAL | 3 refills | Status: DC

## 2017-11-23 MED ORDER — DESONIDE 0.05 % EX LOTN: TOPICAL_LOTION | Freq: Two times a day (BID) | CUTANEOUS | 1 refills | Status: DC

## 2017-11-23 NOTE — Patient Instructions (Signed)
It was a pleasure to see you today!  Take care and we wish you the best as you continue your treatment for lymphoma Please let me know if I can be helpful to you

## 2017-12-12 ENCOUNTER — Other Ambulatory Visit: Payer: Self-pay | Admitting: Neurology

## 2017-12-24 DIAGNOSIS — M48061 Spinal stenosis, lumbar region without neurogenic claudication: Secondary | ICD-10-CM | POA: Diagnosis not present

## 2017-12-24 DIAGNOSIS — R5383 Other fatigue: Secondary | ICD-10-CM | POA: Diagnosis not present

## 2017-12-24 DIAGNOSIS — R918 Other nonspecific abnormal finding of lung field: Secondary | ICD-10-CM | POA: Diagnosis not present

## 2017-12-24 DIAGNOSIS — C8339 Diffuse large B-cell lymphoma, extranodal and solid organ sites: Secondary | ICD-10-CM | POA: Diagnosis not present

## 2017-12-24 DIAGNOSIS — Z87891 Personal history of nicotine dependence: Secondary | ICD-10-CM | POA: Diagnosis not present

## 2017-12-24 DIAGNOSIS — R0602 Shortness of breath: Secondary | ICD-10-CM | POA: Diagnosis not present

## 2017-12-24 DIAGNOSIS — M5126 Other intervertebral disc displacement, lumbar region: Secondary | ICD-10-CM | POA: Diagnosis not present

## 2017-12-24 DIAGNOSIS — M21372 Foot drop, left foot: Secondary | ICD-10-CM | POA: Diagnosis not present

## 2017-12-24 DIAGNOSIS — Z9049 Acquired absence of other specified parts of digestive tract: Secondary | ICD-10-CM | POA: Diagnosis not present

## 2017-12-24 DIAGNOSIS — G4700 Insomnia, unspecified: Secondary | ICD-10-CM | POA: Diagnosis not present

## 2017-12-31 DIAGNOSIS — C8339 Diffuse large B-cell lymphoma, extranodal and solid organ sites: Secondary | ICD-10-CM | POA: Diagnosis not present

## 2018-01-04 DIAGNOSIS — C8330 Diffuse large B-cell lymphoma, unspecified site: Secondary | ICD-10-CM | POA: Diagnosis not present

## 2018-01-04 DIAGNOSIS — C8339 Diffuse large B-cell lymphoma, extranodal and solid organ sites: Secondary | ICD-10-CM | POA: Diagnosis not present

## 2018-01-04 DIAGNOSIS — Z452 Encounter for adjustment and management of vascular access device: Secondary | ICD-10-CM | POA: Diagnosis not present

## 2018-01-06 DIAGNOSIS — C8339 Diffuse large B-cell lymphoma, extranodal and solid organ sites: Secondary | ICD-10-CM | POA: Diagnosis not present

## 2018-01-08 DIAGNOSIS — C8339 Diffuse large B-cell lymphoma, extranodal and solid organ sites: Secondary | ICD-10-CM | POA: Diagnosis not present

## 2018-01-11 DIAGNOSIS — M5126 Other intervertebral disc displacement, lumbar region: Secondary | ICD-10-CM | POA: Diagnosis not present

## 2018-01-11 DIAGNOSIS — T451X5D Adverse effect of antineoplastic and immunosuppressive drugs, subsequent encounter: Secondary | ICD-10-CM | POA: Diagnosis not present

## 2018-01-11 DIAGNOSIS — C8339 Diffuse large B-cell lymphoma, extranodal and solid organ sites: Secondary | ICD-10-CM | POA: Diagnosis not present

## 2018-01-11 DIAGNOSIS — J704 Drug-induced interstitial lung disorders, unspecified: Secondary | ICD-10-CM | POA: Diagnosis not present

## 2018-01-11 DIAGNOSIS — G3189 Other specified degenerative diseases of nervous system: Secondary | ICD-10-CM | POA: Diagnosis not present

## 2018-01-11 DIAGNOSIS — M48061 Spinal stenosis, lumbar region without neurogenic claudication: Secondary | ICD-10-CM | POA: Diagnosis not present

## 2018-01-11 DIAGNOSIS — Z87891 Personal history of nicotine dependence: Secondary | ICD-10-CM | POA: Diagnosis not present

## 2018-01-11 DIAGNOSIS — M21372 Foot drop, left foot: Secondary | ICD-10-CM | POA: Diagnosis not present

## 2018-01-11 DIAGNOSIS — G4700 Insomnia, unspecified: Secondary | ICD-10-CM | POA: Diagnosis not present

## 2018-01-11 DIAGNOSIS — R0602 Shortness of breath: Secondary | ICD-10-CM | POA: Diagnosis not present

## 2018-01-12 ENCOUNTER — Ambulatory Visit: Payer: Medicare HMO | Admitting: Neurology

## 2018-01-13 DIAGNOSIS — C8339 Diffuse large B-cell lymphoma, extranodal and solid organ sites: Secondary | ICD-10-CM | POA: Diagnosis not present

## 2018-01-14 DIAGNOSIS — C8339 Diffuse large B-cell lymphoma, extranodal and solid organ sites: Secondary | ICD-10-CM | POA: Diagnosis not present

## 2018-01-15 DIAGNOSIS — C8339 Diffuse large B-cell lymphoma, extranodal and solid organ sites: Secondary | ICD-10-CM | POA: Diagnosis not present

## 2018-01-18 DIAGNOSIS — C8339 Diffuse large B-cell lymphoma, extranodal and solid organ sites: Secondary | ICD-10-CM | POA: Diagnosis not present

## 2018-01-18 DIAGNOSIS — Z23 Encounter for immunization: Secondary | ICD-10-CM | POA: Diagnosis not present

## 2018-01-19 DIAGNOSIS — C8339 Diffuse large B-cell lymphoma, extranodal and solid organ sites: Secondary | ICD-10-CM | POA: Diagnosis not present

## 2018-01-20 DIAGNOSIS — C8339 Diffuse large B-cell lymphoma, extranodal and solid organ sites: Secondary | ICD-10-CM | POA: Diagnosis not present

## 2018-01-21 DIAGNOSIS — C8339 Diffuse large B-cell lymphoma, extranodal and solid organ sites: Secondary | ICD-10-CM | POA: Diagnosis not present

## 2018-01-22 DIAGNOSIS — C8339 Diffuse large B-cell lymphoma, extranodal and solid organ sites: Secondary | ICD-10-CM | POA: Diagnosis not present

## 2018-02-27 ENCOUNTER — Inpatient Hospital Stay (HOSPITAL_BASED_OUTPATIENT_CLINIC_OR_DEPARTMENT_OTHER)
Admission: EM | Admit: 2018-02-27 | Discharge: 2018-02-28 | DRG: 872 | Disposition: A | Discharge status: Home / Self Care | Payer: Medicare HMO | Attending: Internal Medicine | Admitting: Internal Medicine

## 2018-02-27 ENCOUNTER — Other Ambulatory Visit: Payer: Self-pay

## 2018-02-27 ENCOUNTER — Encounter (HOSPITAL_BASED_OUTPATIENT_CLINIC_OR_DEPARTMENT_OTHER): Payer: Self-pay | Admitting: Emergency Medicine

## 2018-02-27 ENCOUNTER — Emergency Department (HOSPITAL_BASED_OUTPATIENT_CLINIC_OR_DEPARTMENT_OTHER): Payer: Medicare HMO

## 2018-02-27 DIAGNOSIS — R5081 Fever presenting with conditions classified elsewhere: Secondary | ICD-10-CM

## 2018-02-27 DIAGNOSIS — I10 Essential (primary) hypertension: Secondary | ICD-10-CM | POA: Diagnosis present

## 2018-02-27 DIAGNOSIS — Z90710 Acquired absence of both cervix and uterus: Secondary | ICD-10-CM | POA: Diagnosis not present

## 2018-02-27 DIAGNOSIS — Z9049 Acquired absence of other specified parts of digestive tract: Secondary | ICD-10-CM | POA: Diagnosis not present

## 2018-02-27 DIAGNOSIS — Z923 Personal history of irradiation: Secondary | ICD-10-CM

## 2018-02-27 DIAGNOSIS — Z9221 Personal history of antineoplastic chemotherapy: Secondary | ICD-10-CM | POA: Diagnosis not present

## 2018-02-27 DIAGNOSIS — Z880 Allergy status to penicillin: Secondary | ICD-10-CM

## 2018-02-27 DIAGNOSIS — G35 Multiple sclerosis: Secondary | ICD-10-CM | POA: Diagnosis present

## 2018-02-27 DIAGNOSIS — M21379 Foot drop, unspecified foot: Secondary | ICD-10-CM | POA: Diagnosis not present

## 2018-02-27 DIAGNOSIS — K5090 Crohn's disease, unspecified, without complications: Secondary | ICD-10-CM | POA: Diagnosis not present

## 2018-02-27 DIAGNOSIS — E785 Hyperlipidemia, unspecified: Secondary | ICD-10-CM | POA: Diagnosis present

## 2018-02-27 DIAGNOSIS — T50995S Adverse effect of other drugs, medicaments and biological substances, sequela: Secondary | ICD-10-CM

## 2018-02-27 DIAGNOSIS — K5792 Diverticulitis of intestine, part unspecified, without perforation or abscess without bleeding: Secondary | ICD-10-CM | POA: Diagnosis not present

## 2018-02-27 DIAGNOSIS — Z8371 Family history of colonic polyps: Secondary | ICD-10-CM | POA: Diagnosis not present

## 2018-02-27 DIAGNOSIS — T451X5A Adverse effect of antineoplastic and immunosuppressive drugs, initial encounter: Secondary | ICD-10-CM | POA: Diagnosis not present

## 2018-02-27 DIAGNOSIS — Z87891 Personal history of nicotine dependence: Secondary | ICD-10-CM

## 2018-02-27 DIAGNOSIS — C8330 Diffuse large B-cell lymphoma, unspecified site: Secondary | ICD-10-CM | POA: Diagnosis present

## 2018-02-27 DIAGNOSIS — Z888 Allergy status to other drugs, medicaments and biological substances status: Secondary | ICD-10-CM

## 2018-02-27 DIAGNOSIS — Z833 Family history of diabetes mellitus: Secondary | ICD-10-CM | POA: Diagnosis not present

## 2018-02-27 DIAGNOSIS — Z981 Arthrodesis status: Secondary | ICD-10-CM | POA: Diagnosis not present

## 2018-02-27 DIAGNOSIS — D701 Agranulocytosis secondary to cancer chemotherapy: Secondary | ICD-10-CM | POA: Diagnosis not present

## 2018-02-27 DIAGNOSIS — Z8249 Family history of ischemic heart disease and other diseases of the circulatory system: Secondary | ICD-10-CM | POA: Diagnosis not present

## 2018-02-27 DIAGNOSIS — Z882 Allergy status to sulfonamides status: Secondary | ICD-10-CM

## 2018-02-27 DIAGNOSIS — Z79899 Other long term (current) drug therapy: Secondary | ICD-10-CM

## 2018-02-27 DIAGNOSIS — K5730 Diverticulosis of large intestine without perforation or abscess without bleeding: Secondary | ICD-10-CM | POA: Diagnosis not present

## 2018-02-27 DIAGNOSIS — E876 Hypokalemia: Secondary | ICD-10-CM | POA: Diagnosis present

## 2018-02-27 DIAGNOSIS — D703 Neutropenia due to infection: Secondary | ICD-10-CM | POA: Diagnosis not present

## 2018-02-27 DIAGNOSIS — M170 Bilateral primary osteoarthritis of knee: Secondary | ICD-10-CM | POA: Diagnosis present

## 2018-02-27 DIAGNOSIS — M8580 Other specified disorders of bone density and structure, unspecified site: Secondary | ICD-10-CM | POA: Diagnosis present

## 2018-02-27 DIAGNOSIS — K760 Fatty (change of) liver, not elsewhere classified: Secondary | ICD-10-CM | POA: Diagnosis present

## 2018-02-27 DIAGNOSIS — R413 Other amnesia: Secondary | ICD-10-CM | POA: Diagnosis present

## 2018-02-27 DIAGNOSIS — E538 Deficiency of other specified B group vitamins: Secondary | ICD-10-CM | POA: Diagnosis present

## 2018-02-27 DIAGNOSIS — D72819 Decreased white blood cell count, unspecified: Secondary | ICD-10-CM | POA: Diagnosis present

## 2018-02-27 DIAGNOSIS — D709 Neutropenia, unspecified: Secondary | ICD-10-CM | POA: Diagnosis not present

## 2018-02-27 DIAGNOSIS — Z803 Family history of malignant neoplasm of breast: Secondary | ICD-10-CM

## 2018-02-27 DIAGNOSIS — R109 Unspecified abdominal pain: Secondary | ICD-10-CM | POA: Diagnosis not present

## 2018-02-27 DIAGNOSIS — Z7951 Long term (current) use of inhaled steroids: Secondary | ICD-10-CM

## 2018-02-27 DIAGNOSIS — A419 Sepsis, unspecified organism: Secondary | ICD-10-CM | POA: Diagnosis not present

## 2018-02-27 DIAGNOSIS — Z800 Family history of malignant neoplasm of digestive organs: Secondary | ICD-10-CM

## 2018-02-27 DIAGNOSIS — Z885 Allergy status to narcotic agent status: Secondary | ICD-10-CM

## 2018-02-27 DIAGNOSIS — Z881 Allergy status to other antibiotic agents status: Secondary | ICD-10-CM

## 2018-02-27 HISTORY — DX: Malignant (primary) neoplasm, unspecified: C80.1

## 2018-02-27 HISTORY — DX: Non-Hodgkin lymphoma, unspecified, unspecified site: C85.90

## 2018-02-27 LAB — CBC WITH DIFFERENTIAL/PLATELET
ABS IMMATURE GRANULOCYTES: 0.15 10*3/uL — AB (ref 0.00–0.07)
BASOS ABS: 0 10*3/uL (ref 0.0–0.1)
Basophils Relative: 2 %
Eosinophils Absolute: 0 10*3/uL (ref 0.0–0.5)
Eosinophils Relative: 2 %
HCT: 35.2 % — ABNORMAL LOW (ref 36.0–46.0)
Hemoglobin: 11 g/dL — ABNORMAL LOW (ref 12.0–15.0)
IMMATURE GRANULOCYTES: 11 %
LYMPHS PCT: 20 %
Lymphs Abs: 0.3 10*3/uL — ABNORMAL LOW (ref 0.7–4.0)
MCH: 31.4 pg (ref 26.0–34.0)
MCHC: 31.3 g/dL (ref 30.0–36.0)
MCV: 100.6 fL — ABNORMAL HIGH (ref 80.0–100.0)
Monocytes Absolute: 0.4 10*3/uL (ref 0.1–1.0)
Monocytes Relative: 27 %
NRBC: 0 % (ref 0.0–0.2)
Neutro Abs: 0.5 10*3/uL — ABNORMAL LOW (ref 1.7–7.7)
Neutrophils Relative %: 38 %
PLATELETS: 217 10*3/uL (ref 150–400)
RBC: 3.5 MIL/uL — AB (ref 3.87–5.11)
RDW: 15.8 % — ABNORMAL HIGH (ref 11.5–15.5)
WBC Morphology: ABNORMAL
WBC: 1.4 10*3/uL — AB (ref 4.0–10.5)

## 2018-02-27 LAB — COMPREHENSIVE METABOLIC PANEL
ALT: 16 U/L (ref 0–44)
AST: 29 U/L (ref 15–41)
Albumin: 3.6 g/dL (ref 3.5–5.0)
Alkaline Phosphatase: 43 U/L (ref 38–126)
Anion gap: 8 (ref 5–15)
BUN: 13 mg/dL (ref 8–23)
CHLORIDE: 104 mmol/L (ref 98–111)
CO2: 26 mmol/L (ref 22–32)
Calcium: 9.1 mg/dL (ref 8.9–10.3)
Creatinine, Ser: 0.88 mg/dL (ref 0.44–1.00)
Glucose, Bld: 110 mg/dL — ABNORMAL HIGH (ref 70–99)
POTASSIUM: 3.4 mmol/L — AB (ref 3.5–5.1)
SODIUM: 138 mmol/L (ref 135–145)
Total Bilirubin: 0.5 mg/dL (ref 0.3–1.2)
Total Protein: 6.7 g/dL (ref 6.5–8.1)

## 2018-02-27 LAB — PROCALCITONIN: Procalcitonin: <0.1 ng/mL

## 2018-02-27 LAB — LACTIC ACID, PLASMA: LACTIC ACID, VENOUS: 0.7 mmol/L (ref 0.5–1.9)

## 2018-02-27 LAB — PROTIME-INR
INR: 1.32
Prothrombin Time: 16.3 seconds — ABNORMAL HIGH (ref 11.4–15.2)

## 2018-02-27 LAB — I-STAT CG4 LACTIC ACID, ED
LACTIC ACID, VENOUS: 1.16 mmol/L (ref 0.5–1.9)
Lactic Acid, Venous: 0.81 mmol/L (ref 0.5–1.9)

## 2018-02-27 LAB — URINALYSIS, ROUTINE W REFLEX MICROSCOPIC
Bilirubin Urine: NEGATIVE
Glucose, UA: NEGATIVE mg/dL
Hgb urine dipstick: NEGATIVE
Ketones, ur: NEGATIVE mg/dL
LEUKOCYTES UA: NEGATIVE
NITRITE: NEGATIVE
PROTEIN: NEGATIVE mg/dL
Specific Gravity, Urine: <1.005 — ABNORMAL LOW (ref 1.005–1.030)
pH: 5.5 (ref 5.0–8.0)

## 2018-02-27 LAB — APTT: APTT: 50 s — AB (ref 24–36)

## 2018-02-27 MED ORDER — ONDANSETRON HCL 4 MG/2ML IJ SOLN: 4.0000 mg | Freq: Four times a day (QID) | INTRAMUSCULAR | Status: DC | PRN

## 2018-02-27 MED ORDER — PRAVASTATIN SODIUM 20 MG PO TABS: 40.0000 mg | ORAL_TABLET | Freq: Every day | ORAL | Status: DC

## 2018-02-27 MED ORDER — LORAZEPAM 1 MG PO TABS: 1.0000 mg | ORAL_TABLET | Freq: Two times a day (BID) | ORAL | Status: DC | PRN

## 2018-02-27 MED ORDER — SODIUM CHLORIDE 0.9 % IV SOLN
INTRAVENOUS | Status: DC | PRN
  Administered 2018-02-27: 250 mL via INTRAVENOUS
  Administered 2018-02-28: 1000 mL via INTRAVENOUS

## 2018-02-27 MED ORDER — HYDROCODONE-ACETAMINOPHEN 5-325 MG PO TABS: 1.0000 | ORAL_TABLET | ORAL | Status: DC | PRN

## 2018-02-27 MED ORDER — VANCOMYCIN HCL 500 MG IV SOLR
500.0000 mg | Freq: Two times a day (BID) | INTRAVENOUS | Status: DC
  Administered 2018-02-28: 500 mg via INTRAVENOUS
  Filled 2018-02-27 (×2): qty 500

## 2018-02-27 MED ORDER — ACETAMINOPHEN 325 MG PO TABS: 650.0000 mg | ORAL_TABLET | Freq: Four times a day (QID) | ORAL | Status: DC | PRN

## 2018-02-27 MED ORDER — FENOFIBRATE 160 MG PO TABS
160.0000 mg | ORAL_TABLET | Freq: Every day | ORAL | Status: DC
  Administered 2018-02-28: 160 mg via ORAL
  Filled 2018-02-27: qty 1

## 2018-02-27 MED ORDER — SODIUM CHLORIDE 0.9 % IV SOLN
INTRAVENOUS | Status: AC
  Administered 2018-02-27: 23:00:00 via INTRAVENOUS

## 2018-02-27 MED ORDER — VALACYCLOVIR HCL 500 MG PO TABS
500.0000 mg | ORAL_TABLET | Freq: Every day | ORAL | Status: DC
  Administered 2018-02-27 – 2018-02-28 (×2): 500 mg via ORAL
  Filled 2018-02-27 (×2): qty 1

## 2018-02-27 MED ORDER — ONDANSETRON HCL 4 MG PO TABS: 4.0000 mg | ORAL_TABLET | Freq: Four times a day (QID) | ORAL | Status: DC | PRN

## 2018-02-27 MED ORDER — ACETAMINOPHEN 650 MG RE SUPP: 650.0000 mg | Freq: Four times a day (QID) | RECTAL | Status: DC | PRN

## 2018-02-27 MED ORDER — SODIUM CHLORIDE 0.9 % IV SOLN
1.0000 g | Freq: Once | INTRAVENOUS | Status: AC
  Administered 2018-02-27: 1 g via INTRAVENOUS
  Filled 2018-02-27: qty 1

## 2018-02-27 MED ORDER — VANCOMYCIN HCL IN DEXTROSE 1-5 GM/200ML-% IV SOLN
1000.0000 mg | Freq: Once | INTRAVENOUS | Status: AC
  Administered 2018-02-27: 1000 mg via INTRAVENOUS
  Filled 2018-02-27: qty 200

## 2018-02-27 MED ORDER — MEGESTROL ACETATE 40 MG PO TABS
40.0000 mg | ORAL_TABLET | Freq: Two times a day (BID) | ORAL | Status: DC
  Filled 2018-02-27 (×2): qty 1

## 2018-02-27 MED ORDER — SODIUM CHLORIDE 0.9 % IV SOLN
1.0000 g | Freq: Two times a day (BID) | INTRAVENOUS | Status: DC
  Administered 2018-02-28: 1 g via INTRAVENOUS
  Filled 2018-02-27: qty 1

## 2018-02-27 MED ORDER — LACTATED RINGERS IV BOLUS
1000.0000 mL | Freq: Once | INTRAVENOUS | Status: AC
  Administered 2018-02-27: 1000 mL via INTRAVENOUS

## 2018-02-27 MED ORDER — ACETAMINOPHEN 325 MG PO TABS
650.0000 mg | ORAL_TABLET | Freq: Once | ORAL | Status: AC
  Administered 2018-02-27: 650 mg via ORAL
  Filled 2018-02-27: qty 2

## 2018-02-27 MED ORDER — IOPAMIDOL (ISOVUE-300) INJECTION 61%
100.0000 mL | Freq: Once | INTRAVENOUS | Status: AC | PRN
  Administered 2018-02-27: 100 mL via INTRAVENOUS

## 2018-02-27 MED ORDER — GABAPENTIN 300 MG PO CAPS
600.0000 mg | ORAL_CAPSULE | Freq: Three times a day (TID) | ORAL | Status: DC
  Administered 2018-02-27 – 2018-02-28 (×2): 600 mg via ORAL
  Filled 2018-02-27 (×2): qty 2

## 2018-02-27 MED ORDER — MEMANTINE HCL 10 MG PO TABS
10.0000 mg | ORAL_TABLET | Freq: Two times a day (BID) | ORAL | Status: DC
  Administered 2018-02-27 – 2018-02-28 (×2): 10 mg via ORAL
  Filled 2018-02-27 (×2): qty 1

## 2018-02-27 NOTE — H&P (Signed)
[name] JSE:[id number] DOB: April 14, 1941 DOA: 02/27/2018     PCP: Darreld Mclean, MD   Outpatient Specialists:      Oncology  Dr. Rogers Blocker, MD [facility]  neurologist is Dr. Jannifer Franklin-  Patient arrived to ER on 02/27/18 at 1533  Patient coming from: home Lives  With family    Chief Complaint:  Chief Complaint  Patient presents with  . Abdominal Pain    HPI: [name] is a 77 y.o. female with medical history significant of non- Hodgkin's lymphoma, renal masses status post R CHOP with intrathecal methotrexate now undergoing consolidation radiation therapy to bulky disease of the left kidney Crohn's disease, multiple sclerosis foot drop associated with a chemo infusion  Presented to [facility] with abdominal pain that reminded her of diverticulitis pain started in the left side and associated with fever Been having discomfort in the past 2 days pain mainly located to left lower quadrant described the pain initially as aching 5 out of 10 pain.  Otherwise no nausea vomiting no diarrhea no melena no bright red per rectum.   Regarding pertinent Chronic problems:   Regarding diffuse B cell non-Hodgkin's lymphoma posttreatment PET scan showed no evidence of active lymphoma. Last Chemo was in September Finished radiation therapy late September She was told she is in recovery WBC on 01/11/2018 was 10.2 Hg 9.9 plt 268  Known history of mild Crohn's disease History of multiple sclerosis diagnosis in 2000 [place] but this was stopped for her chemo, they may start back up on a different med later bc They were concerned that [place] have caused her cancer.   While in ER: Noted to have WBC count of 1.4 absolute neutrophil count 0.5 given neutropenia patient was admitted pharmacy recommended broad-spectrum antibiotics she was started on meropenem and vancomycin.  Initial vital signs were worrisome for sepsis given tachycardia leukopenia CT scan showed acute diverticulitis  without abscess or perforation.  No Evidence of hypotension while [place] patient was transferred to [facility] The following Work up has been ordered so far:  Orders Placed This Encounter  Procedures  . Critical Care  . Blood Culture (routine x 2)  . Urine culture  . CT ABDOMEN PELVIS W CONTRAST  . DG Chest 2 View  . Comprehensive metabolic panel  . CBC with Differential  . Urinalysis, Routine w reflex microscopic  . Diet NPO time specified  . Saline Lock IV, Maintain IV access  . Cardiac monitoring  . Refer to Sidebar Report for: Sepsis Bundle ED/IP  . Document vital signs within 1-hour of fluid bolus completion and notify provider of bolus completion  . Document Actual / Estimated Weight  . Insert peripheral IV x 2  . Initiate Carrier Fluid Protocol  . Call Code Sepsis ([facility] [phone]) Reason for Consult? tracking  . vancomycin per pharmacy consult  . meropenem (MERREM) per pharmacyconsult  . Consult to hospitalist  . Protective Precautions (isolation)  . Pulse oximetry, continuous  . I-Stat CG4 Lactic Acid, ED  . ED EKG 12-Lead  . EKG 12-Lead  . Admit to Inpatient (patient's expected length of stay will be greater than 2 midnights or inpatient only procedure)      Following Medications were ordered in ER: Medications  vancomycin (VANCOCIN) 500 mg in sodium chloride 0.9 % 100 mL IVPB (has no administration in time range)  meropenem (MERREM) 1 g in sodium chloride 0.9 % 100 mL IVPB (has no administration in time range)  0.9 %  sodium chloride infusion ( Intravenous Stopped 02/27/18 1905)  acetaminophen (TYLENOL) tablet 650 mg (650 mg Oral Given 02/27/18 1623)  lactated ringers bolus 1,000 mL (0 mLs Intravenous Stopped 02/27/18 1840)  meropenem (MERREM) 1 g in sodium chloride 0.9 % 100 mL IVPB (0 g Intravenous Stopped 02/27/18 1740)  vancomycin (VANCOCIN) IVPB 1000 mg/200 mL premix (0 mg Intravenous Stopped 02/27/18 1906)  iopamidol (ISOVUE-300)  61 % injection 100 mL (100 mLs Intravenous Contrast Given 02/27/18 1714)    Significant initial  Findings: Abnormal Labs Reviewed  COMPREHENSIVE METABOLIC PANEL - Abnormal; Notable for the following components:      Result Value   Potassium 3.4 (*)    Glucose, Bld 110 (*)    All other components within normal limits  CBC WITH DIFFERENTIAL/PLATELET - Abnormal; Notable for the following components:   WBC 1.4 (*)    RBC 3.50 (*)    Hemoglobin 11.0 (*)    HCT 35.2 (*)    MCV 100.6 (*)    RDW 15.8 (*)    Neutro Abs 0.5 (*)    Lymphs Abs 0.3 (*)    Abs Immature Granulocytes 0.15 (*)    All other components within normal limits  URINALYSIS, ROUTINE W REFLEX MICROSCOPIC - Abnormal; Notable for the following components:   Specific Gravity, Urine <1.005 (*)    All other components within normal limits     Na 138 K 3.4  Cr    stable,    Lab Results  Component Value Date   CREATININE 0.88 02/27/2018   CREATININE 0.85 01/02/2017   CREATININE 0.66 06/26/2016      HG/HCT  stable,      Component Value Date/Time   HGB 11.0 (L) 02/27/2018 1606   HGB 12.3 01/02/2017 0823   HGB 12.7 05/16/2014 1024   HCT 35.2 (L) 02/27/2018 1606   HCT 39.5 01/02/2017 0823   HCT 39.9 05/16/2014 1024    Troponin (Point of Care Test) No results for input(s): TROPIPOC in the last 72 hours.    BNP (last 3 results) No results for input(s): BNP in the last 8760 hours.  ProBNP (last 3 results) No results for input(s): PROBNP in the last 8760 hours.  Lactic Acid, Venous    Component Value Date/Time   LATICACIDVEN 0.81 02/27/2018 1810     UA   no evidence of UTI     CXR -  NON acute  CTabd/pelvis -    Stranding around the proximal sigmoid  colon compatible with active diverticulitis  ECG:  Personally reviewed by me showing: HR : 110 Rhythm:  Sinus tachycardia  no evidence of ischemic changes QTC 435    ED Triage Vitals  Enc Vitals Group     BP 02/27/18 1538 (!) 160/98     Pulse Rate  02/27/18 1538 (!) 122     Resp 02/27/18 1538 18     Temp 02/27/18 1538 100.3 F (37.9 C)     Temp Source 02/27/18 1538 Oral     SpO2 02/27/18 1538 98 %     Weight 02/27/18 1540 128 lb (58.1 kg)     Height 02/27/18 1540 _0  (1.651 m)     Head Circumference --      Peak Flow --      Pain Score 02/27/18 1539 6     Pain Loc --      Pain Edu? --      Excl. in [place]? --   NOMV(67)@  Latest  Blood pressure 122/89, pulse 100, temperature 98.5 F (36.9 C), temperature source Oral, resp. rate 16, height _0  (1.651 m), weight 58.1 kg, SpO2 97 %.    Hospitalist was called for admission for sepsis due to diverticulitis in the setting of neutropenia   Review of Systems:    Pertinent positives include: fevers, chills, fatigue, abdominal pain,   Constitutional:  No weight loss, night sweats, weight loss  HEENT:  No headaches, Difficulty swallowing,Tooth/dental problems,Sore throat,  No sneezing, itching, ear ache, nasal congestion, post nasal drip,  Cardio-vascular:  No chest pain, Orthopnea, PND, anasarca, dizziness, palpitations.no Bilateral lower extremity swelling  GI:  No heartburn, indigestion, nausea, vomiting, diarrhea, change in bowel habits, loss of appetite, melena, blood in stool, hematemesis Resp:  no shortness of breath at rest. No dyspnea on exertion, No excess mucus, no productive cough, No non-productive cough, No coughing up of blood.No change in color of mucus.No wheezing. Skin:  no rash or lesions. No jaundice GU:  no dysuria, change in color of urine, no urgency or frequency. No straining to urinate.  No flank pain.  Musculoskeletal:  No joint pain or no joint swelling. No decreased range of motion. No back pain.  Psych:  No change in mood or affect. No depression or anxiety. No memory loss.  Neuro: no localizing neurological complaints, no tingling, no weakness, no double vision, no gait abnormality, no slurred speech, no confusion  All systems reviewed  and apart from [place] all are negative  Past Medical History:   Past Medical History:  Diagnosis Date  . Arthritis, degenerative    both knees  . B12 deficiency 07/06/2014  . Cancer ([place])   . Colitis   . Diverticulosis   . Duodenitis   . Fatty liver   . Gastritis   . Helicobacter pylori gastritis   . Hyperlipidemia   . Hypertension    not treated  . IBD (inflammatory bowel disease)   . Lymphoma ([place])   . Memory difficulty 01/05/2013  . Memory loss    mild  . Multiple sclerosis ([place])   . Osteopenia   . Vitamin B12 deficiency       Past Surgical History:  Procedure Laterality Date  . ABDOMINAL HYSTERECTOMY  2010  . BLADDER SURGERY  2010   Bladder Tack  . CERVICAL SPINE SURGERY    . CHOLECYSTECTOMY    . HEMORRHOID SURGERY    . ROTATOR CUFF REPAIR    . SPINAL FUSION    . TUBAL LIGATION      Social History:  Ambulatory   independently     reports that she quit smoking about 20 years ago. She has never used smokeless tobacco. She reports that she does not drink alcohol or use drugs.     Family History:   Family History  Problem Relation Age of Onset  . Diabetes Father   . Heart disease Father   . Colon polyps Mother   . Breast cancer Cousin   . Colon cancer Maternal Grandmother        ? may be duodenal cancer  . Colon polyps Brother   . Hypertension Brother     Allergies: Allergies  Allergen Reactions  . Sulfa Antibiotics Hives and Nausea And Vomiting  . Vytorin [Ezetimibe-Simvastatin]     White skin patch  . Cephalexin   . Doxycycline     Raw mouth   . Erythromycin     REACTION: weakness  . Macrobid [Nitrofurantoin Macrocrystal] Hives  .  Morphine Nausea And Vomiting  . Sulfonamide Derivatives Hives and Nausea And Vomiting  . Amoxicillin Hives     Prior to Admission medications   Medication Sig Start Date End Date Taking? Authorizing Provider  b complex vitamins tablet Take 1 tablet by mouth daily.    [provider]    Cholecalciferol (VITAMIN D3) 5000 units TABS Take by mouth.    [provider]  desonide (DESOWEN) 0.05 % lotion Apply topically 2 (two) times daily. Use as needed for itchy ears 11/23/17   Copland, Gay Filler, MD  fenofibrate (TRICOR) 145 MG tablet Take 1 tablet (145 mg total) by mouth daily. 11/23/17   Copland, Gay Filler, MD  fluticasone (FLONASE) 50 MCG/ACT nasal spray Place into both nostrils daily.    [provider]  gabapentin (NEURONTIN) 600 MG tablet TAKE ONE TABLET BY MOUTH THREE TIMES A DAY 12/15/17   Kathrynn Ducking, MD  Glucosamine 500 MG TABS Take 1 tablet by mouth 2 (two) times daily.     [provider]  LORazepam (ATIVAN) 1 MG tablet Take 1 mg by mouth every 12 (twelve) hours as needed for anxiety.    [provider]  lovastatin (MEVACOR) 40 MG tablet Take 1 tablet (40 mg total) by mouth 2 (two) times daily. 11/23/17   Copland, Gay Filler, MD  megestrol (MEGACE) 40 MG tablet Take 80 mg by mouth 2 (two) times daily. 02/03/18   [provider]  memantine (NAMENDA) 10 MG tablet TAKE ONE TABLET BY MOUTH TWICE A DAY 10/09/17   Kathrynn Ducking, MD  Multiple Vitamin (MULTIVITAMIN) tablet Take 0.5 tablets by mouth daily.     [provider]  pegfilgrastim (NEULASTA ONPRO KIT) 6 MG/0.6ML injection Inject 6 mg into the skin once. Inject via provided programmed delivery device.    [provider]  predniSONE (DELTASONE) 50 MG tablet Take 50 mg by mouth 4 (four) times daily.    [provider]  Probiotic Product (ALIGN PO) Take by mouth daily.    [provider]  Probiotic Product (PROBIOTIC FORMULA PO) Take 1 tablet by mouth daily.      [provider]  valACYclovir (VALTREX) 500 MG tablet Take 500 mg by mouth daily.    [provider]   Physical Exam: Blood pressure 122/89, pulse 100, temperature 98.5 F (36.9 C), temperature source Oral, resp. rate 16, height _0  (1.651 m), weight 58.1 kg,  SpO2 97 %. 1. General:  in No Acute distress  Chronically ill -appearing 2. Psychological: Alert and  Oriented 3. Head/ENT:    Dry Mucous Membranes                          Head Non traumatic, neck supple                           Poor Dentition 4. SKIN:  decreased Skin turgor,  Skin clean Dry and intact no rash 5. Heart: Regular rate and rhythm no  Murmur, no Rub or gallop 6. Lungs:   no wheezes or crackles   7. Abdomen: Soft,  non-tender, Non distended   obese  bowel sounds present 8. Lower extremities: no clubbing, cyanosis, or  edema 9. Neurologically Grossly intact, moving all 4 extremities equally  10. MSK: Normal range of motion   LABS:     Recent Labs  Lab 02/27/18 1606  WBC 1.4*  NEUTROABS 0.5*  HGB 11.0*  HCT 35.2*  MCV 100.6*  PLT 093   Basic Metabolic Panel: Recent Labs  Lab 02/27/18 1606  NA 138  K 3.4*  CL 104  CO2 26  GLUCOSE 110*  BUN 13  CREATININE 0.88  CALCIUM 9.1      Recent Labs  Lab 02/27/18 1606  AST 29  ALT 16  ALKPHOS 43  BILITOT 0.5  PROT 6.7  ALBUMIN 3.6   No results for input(s): LIPASE, AMYLASE in the last 168 hours. No results for input(s): AMMONIA in the last 168 hours.    HbA1C: No results for input(s): HGBA1C in the last 72 hours. CBG: No results for input(s): GLUCAP in the last 168 hours.    Urine analysis:    Component Value Date/Time   COLORURINE YELLOW 02/27/2018 1912   APPEARANCEUR CLEAR 02/27/2018 1912   APPEARANCEUR Cloudy (A) 12/28/2015 0839   LABSPEC <1.005 (L) 02/27/2018 1912   PHURINE 5.5 02/27/2018 1912   GLUCOSEU NEGATIVE 02/27/2018 [place] NEGATIVE 02/27/2018 [place] NEGATIVE 02/27/2018 1912   BILIRUBINUR Negative 12/28/2015 [place] 02/27/2018 1912   PROTEINUR NEGATIVE 02/27/2018 1912   UROBILINOGEN 0.2 07/29/2013 1753   NITRITE NEGATIVE 02/27/2018 1912   LEUKOCYTESUR NEGATIVE 02/27/2018 1912   LEUKOCYTESUR 3+ (A) 12/28/2015 0839       Cultures: No  results found for: SDES, [place], CULT, REPTSTATUS   Radiological Exams on Admission: Dg Chest 2 View  Result Date: 02/27/2018 CLINICAL DATA:  Large B-cell lymphoma. Fever. Possible chemotherapy reaction. EXAM: CHEST - 2 VIEW COMPARISON:  Chest CT dated 06/10/2017. Chest radiographs dated 05/15/2017. FINDINGS: Heart size near the upper limit of normal. Clear lungs. Cervical spine fixation hardware. Cholecystectomy clips. Left neck calcifications. IMPRESSION: 1. No acute abnormality. 2. Probable left carotid artery atheromatous calcifications. Electronically Signed   By: Claudie Revering M.D.   On: 02/27/2018 17:49   Ct Abdomen Pelvis W Contrast  Result Date: 02/27/2018 CLINICAL DATA:  Abdominal distention. Left side abdominal pain. History of lymphoma. EXAM: CT ABDOMEN AND PELVIS WITH CONTRAST TECHNIQUE: Multidetector CT imaging of the abdomen and pelvis was performed using the standard protocol following bolus administration of intravenous contrast. CONTRAST:  146m ISOVUE-300 IOPAMIDOL (ISOVUE-300) INJECTION 61% COMPARISON:  None. FINDINGS: Lower chest: Dependent atelectasis in the lower lobes. No effusions. Hepatobiliary: No focal liver abnormality is seen. Status post cholecystectomy. No biliary dilatation. Pancreas: No focal abnormality or ductal dilatation. Spleen: No focal abnormality.  Normal size. Adrenals/Urinary Tract: Area of abnormal enhancement noted in the upper pole of the left kidney. Favor infarct although infection/pyelonephritis could have a similar appearance. No hydronephrosis bilaterally. Urinary bladder and adrenal glands unremarkable. Stomach/Bowel: Left colonic diverticulosis. Inflammatory stranding around the proximal sigmoid colon compatible with active diverticulitis. Stomach and small bowel decompressed. No evidence of bowel obstruction. 4 cm duodenal diverticulum. Vascular/Lymphatic: Diffuse aortic and iliac calcifications. No evidence of aneurysm or adenopathy.  Reproductive: Prior hysterectomy.  No adnexal masses. Other: No free fluid or free air. Musculoskeletal: No acute bony abnormality. IMPRESSION: Left colonic diverticulosis. Stranding around the proximal sigmoid colon compatible with active diverticulitis. Abnormal area of enhancement in the upper pole of the left kidney could reflect renal infarct or focal pyelonephritis. Diffuse aortic atherosclerosis. Prior cholecystectomy. Electronically Signed   By: KRolm BaptiseM.D.   On: 02/27/2018 17:46    Chart has been reviewed    Assessment/Plan   77y.o. female with medical history significant of non- Hodgkin's lymphoma, renal masses  status post R CHOP with intrathecal methotrexate now undergoing consolidation radiation therapy to bulky disease of the left kidney Crohn's disease, multiple sclerosis foot drop associated with a chemo infusion Admitted for sepsis due to diverticulitis in the setting of neutropenia  Present on Admission: . Sepsis ([place]) secondary to diverticulitis we will continue broad-spectrum antibiotics meropenem and vancomycin await blood culture results rehydrate follow serial lactic acid . Diverticulitis -bowel rest, rehydrate continue IV antibiotics meropenem and vancomycin and follow clinically patient is at high risk of complication require inpatient stay secondary to neutropenia complicating medical management . Memory difficulty . Leukopenia neutropenia - pt finished Chemo/radiation therapy in September last WBC WNL this is acute change. Obtained blood smear. Consulted [facility] where pt is followed Spoke to Dr. Jacquiline Doe at 11:26 PM He Felt it was due to radiation or chemo therapy delayed reaction it would be a typical for diffuse B-cell lymphoma to spread to blood.  Recommending continuing medical therapy for diverticulitis and then have patient follow-up with primary oncology after her discharge.  We will continue neutropenic precautions  . Diffuse large B-cell lymphoma ([place])  -follow-up at [facility] as an outpatient   Other plan as per orders.  DVT prophylaxis:  SCD      Code Status:  FULL CODE   as per patient   I had personally discussed CODE STATUS with patient   Family Communication:   Family not at  Bedside    Disposition Plan:   To home once workup is complete and patient is stable                      Consults called: none  Admission status:   inpatient     Expect 2 midnight stay secondary to severity of patient's current illness including     Severe lab/radiological abnormalities including:   Evidence of diverticulitis in the setting of neutropenia resulting neutropenic fever and extensive comorbidities including:    malignancy,   That are currently affecting medical management.  I expect  patient to be hospitalized for 2 midnights requiring inpatient medical care.  Patient is at high risk for adverse outcome (such as loss of life or disability) if not treated.  Indication for inpatient stay as follows:   ,    inability to maintain oral hydration      Need for IV antibiotics IV medications    Level of care     medical floor     Toy Baker 02/27/2018, 11:30 PM  [facility]  Pager [phone]   after 2 AM please page floor coverage PA If 7AM-7PM, please contact the day team taking care of the patient  [email or web address]  Password [email or web address]

## 2018-02-27 NOTE — ED Notes (Signed)
Patient transported to CT 

## 2018-02-27 NOTE — ED Notes (Signed)
ED Provider at bedside. Dr. Ronnald Nian

## 2018-02-27 NOTE — ED Notes (Signed)
Reports given to [facility]

## 2018-02-27 NOTE — ED Triage Notes (Signed)
Patient states that she thinks she is having a bout of diverticulitis. The patient reports that she is having left sided abdominal pain and fever starting today

## 2018-02-27 NOTE — ED Provider Notes (Signed)
[facility] EMERGENCY DEPARTMENT Provider Note   CSN: [id number] Arrival date & time: 02/27/18  1533     History   Chief Complaint Chief Complaint  Patient presents with  . Abdominal Pain    HPI [name] is a 77 y.o. female.  The history is provided by the patient.  Abdominal Pain   This is a new problem. The current episode started more than 2 days ago. The problem occurs daily. The problem has been gradually worsening. Associated with: hx of diverticulitis, recent chemo/radiation for lymphoma at [facility]. Fever today. The pain is located in the LLQ. The quality of the pain is aching and dull. The pain is at a severity of 5/10. The pain is moderate. Associated symptoms include fever. Pertinent negatives include anorexia, belching, diarrhea, flatus, hematochezia, melena, nausea, vomiting, constipation, dysuria, frequency, hematuria, headaches and arthralgias. The symptoms are aggravated by palpation. Nothing relieves the symptoms. Past workup includes GI consult.    Past Medical History:  Diagnosis Date  . Arthritis, degenerative    both knees  . B12 deficiency 07/06/2014  . Cancer ([place])   . Colitis   . Diverticulosis   . Duodenitis   . Fatty liver   . Gastritis   . Helicobacter pylori gastritis   . Hyperlipidemia   . Hypertension    not treated  . IBD (inflammatory bowel disease)   . Lymphoma ([facility])   . Memory difficulty 01/05/2013  . Memory loss    mild  . Multiple sclerosis ([place])   . Osteopenia   . Vitamin B12 deficiency     Patient Active Problem List   Diagnosis Date Noted  . Sepsis ([place]) 02/27/2018  . Osteoarthritis of cervical spine 09/15/2016  . Abnormality of gait 11/07/2015  . Dizziness and giddiness 11/07/2015  . Mild cognitive impairment 01/09/2015  . B12 deficiency 07/06/2014  . Leukopenia 05/06/2013  . Memory difficulty 01/05/2013  . HYPERLIPIDEMIA 11/28/2008  . Multiple sclerosis ([place]) 11/28/2008  . INFLAMMATORY BOWEL DISEASE  11/28/2008  . DIVERTICULOSIS, COLON 11/28/2008  . FATTY LIVER DISEASE 11/28/2008  . PERSONAL HISTORY OF PEPTIC ULCER DISEASE 11/28/2008    Past Surgical History:  Procedure Laterality Date  . ABDOMINAL HYSTERECTOMY  2010  . BLADDER SURGERY  2010   Bladder Tack  . CERVICAL SPINE SURGERY    . CHOLECYSTECTOMY    . HEMORRHOID SURGERY    . ROTATOR CUFF REPAIR    . SPINAL FUSION    . TUBAL LIGATION       OB History   None      Home Medications    Prior to Admission medications   Medication Sig Start Date End Date Taking? Authorizing Provider  b complex vitamins tablet Take 1 tablet by mouth daily.    [provider]  cetirizine (ZYRTEC) 10 MG tablet Take 10 mg by mouth daily.    [provider]  Cholecalciferol (VITAMIN D3) 5000 units TABS Take by mouth.    [provider]  desonide (DESOWEN) 0.05 % cream Apply topically 2 (two) times daily.    [provider]  desonide (DESOWEN) 0.05 % lotion Apply topically 2 (two) times daily. Use as needed for itchy ears 11/23/17   Copland, Gay Filler, MD  fenofibrate (TRICOR) 145 MG tablet Take 1 tablet (145 mg total) by mouth daily. 11/23/17   Copland, Gay Filler, MD  fluticasone (FLONASE) 50 MCG/ACT nasal spray Place into both nostrils daily.    [provider]  gabapentin (NEURONTIN) 600  MG tablet TAKE ONE TABLET BY MOUTH THREE TIMES A DAY 12/15/17   Kathrynn Ducking, MD  Glucosamine 500 MG TABS Take 1 tablet by mouth 2 (two) times daily.     [provider]  LORazepam (ATIVAN) 1 MG tablet Take 1 mg by mouth every 12 (twelve) hours as needed for anxiety.    [provider]  lovastatin (MEVACOR) 40 MG tablet Take 1 tablet (40 mg total) by mouth 2 (two) times daily. 11/23/17   Copland, Gay Filler, MD  memantine (NAMENDA) 10 MG tablet TAKE ONE TABLET BY MOUTH TWICE A DAY 10/09/17   Kathrynn Ducking, MD  Multiple Vitamin (MULTIVITAMIN) tablet Take 0.5 tablets by mouth daily.     [provider]  pegfilgrastim (NEULASTA ONPRO KIT) 6 MG/0.6ML injection Inject 6 mg into the skin once. Inject via provided programmed delivery device.    [provider]  predniSONE (DELTASONE) 50 MG tablet Take 50 mg by mouth 4 (four) times daily.    [provider]  Probiotic Product (ALIGN PO) Take by mouth daily.    [provider]  Probiotic Product (PROBIOTIC FORMULA PO) Take 1 tablet by mouth daily.      [provider]  valACYclovir (VALTREX) 500 MG tablet Take 500 mg by mouth daily.    [provider]    Family History Family History  Problem Relation Age of Onset  . Diabetes Father   . Heart disease Father   . Colon polyps Mother   . Breast cancer Cousin   . Colon cancer Maternal Grandmother        ? may be duodenal cancer  . Colon polyps Brother   . Hypertension Brother     Social History Social History   Tobacco Use  . Smoking status: Former Smoker    Last attempt to quit: 06/27/1997    Years since quitting: 20.6  . Smokeless tobacco: Never Used  Substance Use Topics  . Alcohol use: No  . Drug use: No     Allergies   Sulfa antibiotics; Vytorin [ezetimibe-simvastatin]; Cephalexin; Doxycycline; Erythromycin; Macrobid [nitrofurantoin macrocrystal]; Morphine; Sulfonamide derivatives; and Amoxicillin   Review of Systems Review of Systems  Constitutional: Positive for fever. Negative for chills.  HENT: Negative for ear pain and sore throat.   Eyes: Negative for pain and visual disturbance.  Respiratory: Negative for cough and shortness of breath.   Cardiovascular: Negative for chest pain and palpitations.  Gastrointestinal: Positive for abdominal pain. Negative for anorexia, constipation, diarrhea, flatus, hematochezia, melena, nausea and vomiting.  Genitourinary: Negative for dysuria, frequency and hematuria.  Musculoskeletal: Negative for arthralgias and back pain.  Skin: Negative for color change and rash.    Neurological: Negative for seizures, syncope and headaches.  All other systems reviewed and are negative.    Physical Exam Updated Vital Signs BP 128/79   Pulse 99   Temp 98.3 F (36.8 C) (Oral)   Resp 20   Ht 5' 5" (1.651 m)   Wt 58.1 kg   SpO2 96%   BMI 21.30 kg/m   Physical Exam  Constitutional: She appears well-developed and well-nourished. No distress.  HENT:  Head: Normocephalic and atraumatic.  Mouth/Throat: Oropharynx is clear and moist. No oropharyngeal exudate.  Eyes: Pupils are equal, round, and reactive to light. Conjunctivae and EOM are normal. No scleral icterus.  Neck: Neck supple.  Cardiovascular: Normal rate and regular rhythm.  No murmur heard. Pulmonary/Chest: Effort normal and breath sounds normal. No respiratory distress.  Abdominal: Soft. Normal appearance and bowel sounds are normal. There is tenderness in the left lower quadrant. There is guarding. There is no rigidity, no rebound, no tenderness at McBurney's point and negative Murphy's sign.  Musculoskeletal: She exhibits no edema.  Neurological: She is alert.  Skin: Skin is warm and dry. Capillary refill takes less than 2 seconds.  Psychiatric: She has a normal mood and affect.  Nursing note and vitals reviewed.    ED Treatments / Results  Labs (all labs ordered are listed, but only abnormal results are displayed) Labs Reviewed  COMPREHENSIVE METABOLIC PANEL - Abnormal; Notable for the following components:      Result Value   Potassium 3.4 (*)    Glucose, Bld 110 (*)    All other components within normal limits  CBC WITH DIFFERENTIAL/PLATELET - Abnormal; Notable for the following components:   WBC 1.4 (*)    RBC 3.50 (*)    Hemoglobin 11.0 (*)    HCT 35.2 (*)    MCV 100.6 (*)    RDW 15.8 (*)    Neutro Abs 0.5 (*)    Lymphs Abs 0.3 (*)    Abs Immature Granulocytes 0.15 (*)    All other components within normal limits  URINALYSIS, ROUTINE W REFLEX MICROSCOPIC - Abnormal; Notable for  the following components:   Specific Gravity, Urine <1.005 (*)    All other components within normal limits  CULTURE, BLOOD (ROUTINE X 2)  CULTURE, BLOOD (ROUTINE X 2)  URINE CULTURE  I-STAT CG4 LACTIC ACID, ED  I-STAT CG4 LACTIC ACID, ED    EKG None  Radiology Dg Chest 2 View  Result Date: 02/27/2018 CLINICAL DATA:  Large B-cell lymphoma. Fever. Possible chemotherapy reaction. EXAM: CHEST - 2 VIEW COMPARISON:  Chest CT dated 06/10/2017. Chest radiographs dated 05/15/2017. FINDINGS: Heart size near the upper limit of normal. Clear lungs. Cervical spine fixation hardware. Cholecystectomy clips. Left neck calcifications. IMPRESSION: 1. No acute abnormality. 2. Probable left carotid artery atheromatous calcifications. Electronically Signed   By: Claudie Revering M.D.   On: 02/27/2018 17:49   Ct Abdomen Pelvis W Contrast  Result Date: 02/27/2018 CLINICAL DATA:  Abdominal distention. Left side abdominal pain. History of lymphoma. EXAM: CT ABDOMEN AND PELVIS WITH CONTRAST TECHNIQUE: Multidetector CT imaging of the abdomen and pelvis was performed using the standard protocol following bolus administration of intravenous contrast. CONTRAST:  169m ISOVUE-300 IOPAMIDOL (ISOVUE-300) INJECTION 61% COMPARISON:  None. FINDINGS: Lower chest: Dependent atelectasis in the lower lobes. No effusions. Hepatobiliary: No focal liver abnormality is seen. Status post cholecystectomy. No biliary dilatation. Pancreas: No focal abnormality or ductal dilatation. Spleen: No focal abnormality.  Normal size. Adrenals/Urinary Tract: Area of abnormal enhancement noted in the upper pole of the left kidney. Favor infarct although infection/pyelonephritis could have a similar appearance. No hydronephrosis bilaterally. Urinary bladder and adrenal glands unremarkable. Stomach/Bowel: Left colonic diverticulosis. Inflammatory stranding around the proximal sigmoid colon compatible with active diverticulitis. Stomach and small bowel  decompressed. No evidence of bowel obstruction. 4 cm duodenal diverticulum. Vascular/Lymphatic: Diffuse aortic and iliac calcifications. No evidence of aneurysm or adenopathy. Reproductive: Prior hysterectomy.  No adnexal masses. Other: No free fluid or free air. Musculoskeletal: No acute bony abnormality. IMPRESSION: Left colonic diverticulosis. Stranding around the proximal sigmoid colon compatible with active diverticulitis. Abnormal area of enhancement in the upper pole of the left kidney could reflect renal infarct or focal pyelonephritis. Diffuse aortic atherosclerosis. Prior cholecystectomy. Electronically Signed   By: KRolm BaptiseM.D.   On:  02/27/2018 17:46    Procedures .Critical Care Performed by: Lennice Sites, DO Authorized by: Lennice Sites, DO   Critical care provider statement:    Critical care time (minutes):  40   Critical care was necessary to treat or prevent imminent or life-threatening deterioration of the following conditions:  Sepsis   Critical care was time spent personally by me on the following activities:  Development of treatment plan with patient or surrogate, evaluation of patient's response to treatment, discussions with primary provider, obtaining history from patient or surrogate, ordering and performing treatments and interventions, ordering and review of laboratory studies, ordering and review of radiographic studies, pulse oximetry, re-evaluation of patient's condition and review of old charts   I assumed direction of critical care for this patient from another provider in my specialty: no     (including critical care time)  Medications Ordered in ED Medications  vancomycin (VANCOCIN) 500 mg in sodium chloride 0.9 % 100 mL IVPB (has no administration in time range)  meropenem (MERREM) 1 g in sodium chloride 0.9 % 100 mL IVPB (has no administration in time range)  0.9 %  sodium chloride infusion ( Intravenous Stopped 02/27/18 1905)  acetaminophen (TYLENOL)  tablet 650 mg (650 mg Oral Given 02/27/18 1623)  lactated ringers bolus 1,000 mL (0 mLs Intravenous Stopped 02/27/18 1840)  meropenem (MERREM) 1 g in sodium chloride 0.9 % 100 mL IVPB (0 g Intravenous Stopped 02/27/18 1740)  vancomycin (VANCOCIN) IVPB 1000 mg/200 mL premix (0 mg Intravenous Stopped 02/27/18 1906)  iopamidol (ISOVUE-300) 61 % injection 100 mL (100 mLs Intravenous Contrast Given 02/27/18 1714)     Initial Impression / Assessment and Plan / ED Course  I have reviewed the triage vital signs and the nursing notes.  Pertinent labs & imaging results that were available during my care of the patient were reviewed by me and considered in my medical decision making (see chart for details).     [name] is a 77 year old female w/ history of lymphoma status post radiation chemotherapy last month, diverticulitis who presents to the ED with left lower quadrant abdominal pain.  Patient with fever and tachycardia upon arrival but otherwise normal vitals.  Concern for sepsis.  Left lower quadrant abdominal pain on exam.  Likely patient with diverticulitis and will get CT scan to evaluate for abscess, perforation.  Patient given fever and tachycardia had sepsis work-up with blood cultures, urine studies, chest x-ray.  Patient started on IV meropenem and vancomycin empirically due to drug allergies and concern for possible neutropenia.  Patient with no significant lactic acid and within normal limits.  No signs of pneumonia on chest x-ray.  CT scan shows acute diverticulitis without abscess or perforation.  However patient is neutropenic and high risk for blood infection too.  Patient remained normotensive throughout my care.  Repeat abdominal exams are reassuring.  Repeat vital checks were reassuring.  No significant electrolyte abnormality, kidney injury.  Urinalysis with no signs of infection.  Patient admitted to hospitalist at [facility] for further sepsis care.  Hemodynamically stable at  time of transport.  This chart was dictated using voice recognition software.  Despite best efforts to proofread,  errors can occur which can change the documentation meaning.   Final Clinical Impressions(s) / ED Diagnoses   Final diagnoses:  Neutropenic fever ([place])  Acute diverticulitis  Sepsis, due to unspecified organism, unspecified whether acute organ dysfunction present [facility])    ED Discharge Orders    None  Lennice Sites, DO 02/27/18 0867

## 2018-02-27 NOTE — ED Notes (Signed)
Unable to get pt for CT scan -- RN starting protocol for Sepsis

## 2018-02-27 NOTE — Progress Notes (Signed)
Pharmacy Antibiotic Note  [name] is a 77 y.o. female admitted on 02/27/2018 with possible diverticulitis  Plan: Vanc 1 g x 1 then 500 mg q12h Meropenem 1 g q12h Monitor renal fx cx vt prn  Height: 5' 5"  (165.1 cm) Weight: 128 lb (58.1 kg) IBW/kg (Calculated) : 57  Temp (24hrs), Avg:101.4 F (38.6 C), Min:100.3 F (37.9 C), Max:102.5 F (39.2 C)  Recent Labs  Lab 02/27/18 1606 02/27/18 1610  WBC PENDING  --   CREATININE 0.88  --   LATICACIDVEN  --  1.16    Estimated Creatinine Clearance: 48.9 mL/min (by C-G formula based on SCr of 0.88 mg/dL).    Allergies  Allergen Reactions  . Sulfa Antibiotics Hives and Nausea And Vomiting  . Vytorin [Ezetimibe-Simvastatin]     White skin patch  . Cephalexin   . Doxycycline     Raw mouth   . Erythromycin     REACTION: weakness  . Macrobid [Nitrofurantoin Macrocrystal] Hives  . Morphine Nausea And Vomiting  . Sulfonamide Derivatives Hives and Nausea And Vomiting  . Amoxicillin Hives   Levester Fresh, PharmD, BCPS, BCCCP Clinical Pharmacist [phone]  Please check AMION for all [facility] numbers  02/27/2018 4:46 PM

## 2018-02-27 NOTE — Plan of Care (Signed)
77 yo F with Diffuse large B-cell lymphoma involving the bilateral kidneys  followed by [facility] sp Chemo 11/19/2017: R-CHOP #6 with IT methotrexate  CT: showed Abnormal area of enhancement in the upper pole of the left kidney could reflect renal infarct or focal pyelonephritis Stranding around the proximal sigmoid colon compatible with active diverticulitis  Fever tachycardia appears septic LLQ tenderness ANC ,0.5  Started on meropenem and Vanc  Current vital 120/67 HR 98 100% on RA  ER felt looked stable for [facility] bed   Admit as inpatient to [facility]  [name] 6:54 PM

## 2018-02-27 NOTE — ED Notes (Signed)
Dr. Ronnald Nian notified of pt's WBC 1.4. Pt placed on protective precautions

## 2018-02-28 DIAGNOSIS — D701 Agranulocytosis secondary to cancer chemotherapy: Secondary | ICD-10-CM

## 2018-02-28 DIAGNOSIS — K5792 Diverticulitis of intestine, part unspecified, without perforation or abscess without bleeding: Secondary | ICD-10-CM

## 2018-02-28 DIAGNOSIS — E876 Hypokalemia: Secondary | ICD-10-CM

## 2018-02-28 DIAGNOSIS — A419 Sepsis, unspecified organism: Principal | ICD-10-CM

## 2018-02-28 DIAGNOSIS — T451X5A Adverse effect of antineoplastic and immunosuppressive drugs, initial encounter: Secondary | ICD-10-CM

## 2018-02-28 DIAGNOSIS — C8330 Diffuse large B-cell lymphoma, unspecified site: Secondary | ICD-10-CM

## 2018-02-28 DIAGNOSIS — R5081 Fever presenting with conditions classified elsewhere: Secondary | ICD-10-CM

## 2018-02-28 DIAGNOSIS — D709 Neutropenia, unspecified: Secondary | ICD-10-CM

## 2018-02-28 LAB — COMPREHENSIVE METABOLIC PANEL
ALK PHOS: 37 U/L — AB (ref 38–126)
ALT: 14 U/L (ref 0–44)
AST: 26 U/L (ref 15–41)
Albumin: 3.2 g/dL — ABNORMAL LOW (ref 3.5–5.0)
Anion gap: 7 (ref 5–15)
BILIRUBIN TOTAL: 0.7 mg/dL (ref 0.3–1.2)
BUN: 11 mg/dL (ref 8–23)
CALCIUM: 8.5 mg/dL — AB (ref 8.9–10.3)
CO2: 23 mmol/L (ref 22–32)
CREATININE: 0.78 mg/dL (ref 0.44–1.00)
Chloride: 111 mmol/L (ref 98–111)
Glucose, Bld: 177 mg/dL — ABNORMAL HIGH (ref 70–99)
Potassium: 3 mmol/L — ABNORMAL LOW (ref 3.5–5.1)
Sodium: 141 mmol/L (ref 135–145)
Total Protein: 5.7 g/dL — ABNORMAL LOW (ref 6.5–8.1)

## 2018-02-28 LAB — TSH: TSH: 1.454 u[IU]/mL (ref 0.350–4.500)

## 2018-02-28 LAB — CBC
HCT: 31.4 % — ABNORMAL LOW (ref 36.0–46.0)
Hemoglobin: 9.8 g/dL — ABNORMAL LOW (ref 12.0–15.0)
MCH: 31.7 pg (ref 26.0–34.0)
MCHC: 31.2 g/dL (ref 30.0–36.0)
MCV: 101.6 fL — AB (ref 80.0–100.0)
NRBC: 3.7 % — AB (ref 0.0–0.2)
PLATELETS: 190 10*3/uL (ref 150–400)
RBC: 3.09 MIL/uL — ABNORMAL LOW (ref 3.87–5.11)
RDW: 15.6 % — AB (ref 11.5–15.5)
WBC: 1.1 10*3/uL — CL (ref 4.0–10.5)

## 2018-02-28 LAB — LACTIC ACID, PLASMA: Lactic Acid, Venous: 1.4 mmol/L (ref 0.5–1.9)

## 2018-02-28 LAB — SAVE SMEAR(SSMR), FOR PROVIDER SLIDE REVIEW

## 2018-02-28 LAB — PHOSPHORUS: Phosphorus: 3 mg/dL (ref 2.5–4.6)

## 2018-02-28 LAB — MAGNESIUM: MAGNESIUM: 2 mg/dL (ref 1.7–2.4)

## 2018-02-28 MED ORDER — ACETAMINOPHEN 325 MG PO TABS: 650.0000 mg | ORAL_TABLET | Freq: Four times a day (QID) | ORAL | Status: DC | PRN

## 2018-02-28 MED ORDER — CIPROFLOXACIN HCL 500 MG PO TABS: 500.0000 mg | ORAL_TABLET | Freq: Two times a day (BID) | ORAL | 0 refills | Status: DC

## 2018-02-28 MED ORDER — LORATADINE 10 MG PO TABS: 10.0000 mg | ORAL_TABLET | Freq: Every day | ORAL | Status: DC | PRN

## 2018-02-28 MED ORDER — POTASSIUM CHLORIDE CRYS ER 20 MEQ PO TBCR: 40.0000 meq | EXTENDED_RELEASE_TABLET | Freq: Two times a day (BID) | ORAL | 0 refills | Status: DC

## 2018-02-28 MED ORDER — METRONIDAZOLE 500 MG PO TABS: 500.0000 mg | ORAL_TABLET | Freq: Three times a day (TID) | ORAL | 0 refills | Status: DC

## 2018-02-28 MED ORDER — SODIUM CHLORIDE 0.9 % IV SOLN
1.0000 g | Freq: Three times a day (TID) | INTRAVENOUS | Status: DC
  Filled 2018-02-28 (×2): qty 1

## 2018-02-28 MED ORDER — POTASSIUM CHLORIDE CRYS ER 20 MEQ PO TBCR
40.0000 meq | EXTENDED_RELEASE_TABLET | Freq: Two times a day (BID) | ORAL | Status: DC
  Administered 2018-02-28: 40 meq via ORAL
  Filled 2018-02-28: qty 2

## 2018-02-28 NOTE — Discharge Summary (Signed)
Physician Discharge Summary  SPIRIT WERNLI QMG:[id number] DOB: [birth year] DOA: 02/27/2018  PCP: Darreld Mclean, MD  Admit date: 02/27/2018 Discharge date: 02/28/2018  Admitted From: Home Discharge disposition: Home   Recommendations for Outpatient Follow-Up:   1. Patient will call her oncologist 03/01/2018 for further treatment. 2. Please follow-up final blood culture results.  Negative to date.   Discharge Diagnosis:   Principal Problem:   Diverticulitis complicated by immunosuppression Active Problems:   Memory difficulty   Leukopenia   Sepsis ([place])   Diffuse large B-cell lymphoma (H[facility])   Hypokalemia  Discharge Condition: Improved.  Diet recommendation: Regular.  Code status: Full.   History of Present Illness:   [name] is an 77 y.o. female with a PMH of NHL, renal masses, status post R CHOP with intrathecal methotrexate, now undergoing consolidation radiation therapy to bulky disease of the left kidney, Crohn's disease, multiple sclerosis, foot drop associated with chemo who was admitted 02/27/2018 with abdominal pain similar in quality to a prior episode of diverticulitis, associated with fever.  In the ED, WBC 1.4 with neutropenia, CT with evidence of acute diverticulitis.  Hospital Course by Problem:   Principal Problem:   Sepsis secondary to diverticulitis complicated by immune suppression/neutropenia Patient placed on broad-spectrum antibiotics including meropenem and vancomycin while awaiting blood culture results.  Discontinued vancomycin as this is not likely to cause diverticulitis.  Patient improved quicker than would have been anticipated on admission, and actually preferred discharge to being kept in the hospital longer awaiting white blood cell count recovery and after discussion with her and her husband, was found to have close follow-up with oncologist and therefore was discharged home on oral Cipro/Flagyl.  She was nontoxic-appearing and  felt to be stable for discharge home on oral antibiotics.  Blood cultures negative to date.  Active Problems:   Memory difficulty Stable.    Leukopenia Patient will call her oncologist in the morning to see if there is any role for treatment with Neupogen or a colony-stimulating factor.    Diffuse large B-cell lymphoma ([place]) Has not had any chemotherapy since September.  We will follow-up with her oncologist in the morning.    Hypokalemia 40 mEq p.o. twice daily x2 doses ordered for today.    Medical Consultants:    None.   Discharge Exam:   Vitals:   02/27/18 2034 02/28/18 0459  BP: 122/89 139/84  Pulse: 100 92  Resp: 16 14  Temp: 98.5 F (36.9 C) 98.4 F (36.9 C)  SpO2: 97% 98%   Vitals:   02/27/18 1930 02/27/18 1935 02/27/18 2034 02/28/18 0459  BP: 133/79  122/89 139/84  Pulse: 97  100 92  Resp: 17  16 14   Temp:  98.4 F (36.9 C) 98.5 F (36.9 C) 98.4 F (36.9 C)  TempSrc:  Oral Oral Oral  SpO2: 94%  97% 98%  Weight:   62.4 kg   Height:   5' 5"  (1.651 m)     General exam: Appears calm and comfortable.  Nontoxic appearing. Respiratory system: Clear to auscultation. Respiratory effort normal. Cardiovascular system: S1 & S2 heard, RRR. No JVD,  rubs, gallops or clicks. No murmurs. Gastrointestinal system: Abdomen is nondistended, soft and nontender. No organomegaly or masses felt. Normal bowel sounds heard. Central nervous system: Alert and oriented. No focal neurological deficits. Extremities: No clubbing,  or cyanosis. No edema. Skin: No rashes, lesions or ulcers. Psychiatry: Judgement and insight appear normal. Mood & affect appropriate.    The  results of significant diagnostics from this hospitalization (including imaging, microbiology, ancillary and laboratory) are listed below for reference.     Procedures and Diagnostic Studies:   Dg Chest 2 View  Result Date: 02/27/2018 CLINICAL DATA:  Large B-cell lymphoma. Fever. Possible chemotherapy  reaction. EXAM: CHEST - 2 VIEW COMPARISON:  Chest CT dated 06/10/2017. Chest radiographs dated 05/15/2017. FINDINGS: Heart size near the upper limit of normal. Clear lungs. Cervical spine fixation hardware. Cholecystectomy clips. Left neck calcifications. IMPRESSION: 1. No acute abnormality. 2. Probable left carotid artery atheromatous calcifications. Electronically Signed   By: Claudie Revering M.D.   On: 02/27/2018 17:49   Ct Abdomen Pelvis W Contrast  Result Date: 02/27/2018 CLINICAL DATA:  Abdominal distention. Left side abdominal pain. History of lymphoma. EXAM: CT ABDOMEN AND PELVIS WITH CONTRAST TECHNIQUE: Multidetector CT imaging of the abdomen and pelvis was performed using the standard protocol following bolus administration of intravenous contrast. CONTRAST:  160m ISOVUE-300 IOPAMIDOL (ISOVUE-300) INJECTION 61% COMPARISON:  None. FINDINGS: Lower chest: Dependent atelectasis in the lower lobes. No effusions. Hepatobiliary: No focal liver abnormality is seen. Status post cholecystectomy. No biliary dilatation. Pancreas: No focal abnormality or ductal dilatation. Spleen: No focal abnormality.  Normal size. Adrenals/Urinary Tract: Area of abnormal enhancement noted in the upper pole of the left kidney. Favor infarct although infection/pyelonephritis could have a similar appearance. No hydronephrosis bilaterally. Urinary bladder and adrenal glands unremarkable. Stomach/Bowel: Left colonic diverticulosis. Inflammatory stranding around the proximal sigmoid colon compatible with active diverticulitis. Stomach and small bowel decompressed. No evidence of bowel obstruction. 4 cm duodenal diverticulum. Vascular/Lymphatic: Diffuse aortic and iliac calcifications. No evidence of aneurysm or adenopathy. Reproductive: Prior hysterectomy.  No adnexal masses. Other: No free fluid or free air. Musculoskeletal: No acute bony abnormality. IMPRESSION: Left colonic diverticulosis. Stranding around the proximal sigmoid colon  compatible with active diverticulitis. Abnormal area of enhancement in the upper pole of the left kidney could reflect renal infarct or focal pyelonephritis. Diffuse aortic atherosclerosis. Prior cholecystectomy. Electronically Signed   By: KRolm BaptiseM.D.   On: 02/27/2018 17:46     Labs:   Basic Metabolic Panel: Recent Labs  Lab 02/27/18 1606 02/28/18 0028  NA 138 141  K 3.4* 3.0*  CL 104 111  CO2 26 23  GLUCOSE 110* 177*  BUN 13 11  CREATININE 0.88 0.78  CALCIUM 9.1 8.5*  MG  --  2.0  PHOS  --  3.0   GFR Estimated Creatinine Clearance: 53.8 mL/min (by C-G formula based on SCr of 0.78 mg/dL). Liver Function Tests: Recent Labs  Lab 02/27/18 1606 02/28/18 0028  AST 29 26  ALT 16 14  ALKPHOS 43 37*  BILITOT 0.5 0.7  PROT 6.7 5.7*  ALBUMIN 3.6 3.2*   Coagulation profile Recent Labs  Lab 02/27/18 2203  INR 1.32    CBC: Recent Labs  Lab 02/27/18 1606 02/28/18 0028  WBC 1.4* 1.1*  NEUTROABS 0.5*  --   HGB 11.0* 9.8*  HCT 35.2* 31.4*  MCV 100.6* 101.6*  PLT 217 190   Thyroid function studies Recent Labs    02/28/18 0028  TSH 1.454   Microbiology Recent Results (from the past 240 hour(s))  Blood Culture (routine x 2)     Status: None (Preliminary result)   Collection Time: 02/27/18  4:06 PM  Result Value Ref Range Status   Specimen Description   Final    BLOOD LEFT ARM [facility] at [facility] [address] [place] [address]  Special Requests   Final    BOTTLES DRAWN AEROBIC AND ANAEROBIC Blood Culture adequate volume Performed at [facility], [address] [place], [place] [address]    Culture   Final    NO GROWTH < 24 HOURS Performed at [facility], [place] [address], [place], [place] [address]    Report Status PENDING  Incomplete  Blood Culture (routine x 2)     Status: None (Preliminary result)   Collection Time: 02/27/18  4:36 PM  Result Value Ref Range Status   Specimen Description   Final     BLOOD LEFT FOREARM Performed at [facility], [address] [place], [place] [address]    Special Requests   Final    BOTTLES DRAWN AEROBIC AND ANAEROBIC Blood Culture adequate volume Performed at [facility], [address] [place], [place] [address]    Culture   Final    NO GROWTH < 24 HOURS Performed at [facility], [place] [address] [place], [place] [address]    Report Status PENDING  Incomplete     Discharge Instructions:   Discharge Instructions    Call MD for:  extreme fatigue   Complete by:  As directed    Call MD for:  persistant nausea and vomiting   Complete by:  As directed    Call MD for:  severe uncontrolled pain   Complete by:  As directed    Call MD for:  temperature >100.4   Complete by:  As directed    Diet general   Complete by:  As directed    Discharge instructions   Complete by:  As directed    Call your oncologist on Monday.  Let them know you were put on Cipro/Flagyl for treatment of diverticulitis and that your WBC count was 1.1. Ask your oncologist if you might benefit from a medicine to increase your white blood cell count.   Increase activity slowly   Complete by:  As directed      Allergies as of 02/28/2018      Reactions   Sulfa Antibiotics Hives, Nausea And Vomiting   Vytorin [ezetimibe-simvastatin]    White skin patch   Cephalexin    Doxycycline    Raw mouth    Erythromycin    REACTION: weakness   Macrobid [nitrofurantoin Macrocrystal] Hives   Morphine Nausea And Vomiting   Sulfonamide Derivatives Hives, Nausea And Vomiting   Amoxicillin Hives      Medication List    TAKE these medications   acetaminophen 325 MG tablet Commonly known as:  TYLENOL Take 2 tablets (650 mg total) by mouth every 6 (six) hours as needed for mild pain or fever (or Fever >/= 101).   b complex vitamins tablet Take 1 tablet by mouth daily.   ciprofloxacin 500 MG tablet Commonly known as:  CIPRO Take 1 tablet (500 mg  total) by mouth 2 (two) times daily for 10 days.   desonide 0.05 % lotion Commonly known as:  DESOWEN Apply topically 2 (two) times daily. Use as needed for itchy ears What changed:    how much to take  when to take this  reasons to take this   fenofibrate 145 MG tablet Commonly known as:  TRICOR Take 1 tablet (145 mg total) by mouth daily.   fluticasone 50 MCG/ACT nasal spray Commonly known as:  FLONASE Place 2 sprays into both nostrils as needed for allergies.  gabapentin 600 MG tablet Commonly known as:  NEURONTIN TAKE ONE TABLET BY MOUTH THREE TIMES A DAY   Glucosamine 500 MG Tabs Take 1 tablet by mouth 2 (two) times daily.   loratadine 10 MG tablet Commonly known as:  CLARITIN Take 1 tablet (10 mg total) by mouth daily as needed for allergies. What changed:    when to take this  reasons to take this   LORazepam 1 MG tablet Commonly known as:  ATIVAN Take 1 mg by mouth every 12 (twelve) hours as needed for sleep.   lovastatin 40 MG tablet Commonly known as:  MEVACOR Take 1 tablet (40 mg total) by mouth 2 (two) times daily.   megestrol 40 MG tablet Commonly known as:  MEGACE Take 40 mg by mouth 2 (two) times daily.   memantine 10 MG tablet Commonly known as:  NAMENDA TAKE ONE TABLET BY MOUTH TWICE A DAY   metroNIDAZOLE 500 MG tablet Commonly known as:  FLAGYL Take 1 tablet (500 mg total) by mouth 3 (three) times daily for 14 days.   multivitamin tablet Take 1 tablet by mouth daily.   pegfilgrastim 6 MG/0.6ML injection Commonly known as:  NEULASTA ONPRO KIT Inject 6 mg into the skin once. Inject via provided programmed delivery device.   potassium chloride SA 20 MEQ tablet Commonly known as:  K-DUR,KLOR-CON Take 2 tablets (40 mEq total) by mouth 2 (two) times daily.   PROBIOTIC FORMULA PO Take 1 tablet by mouth daily.   valACYclovir 500 MG tablet Commonly known as:  VALTREX Take 500 mg by mouth daily.   Vitamin D3 125 MCG (5000 UT)  Tabs Take 5,000 Units by mouth daily.      Follow-up Information    Rogers Blocker, MD Follow up.   Specialties:  Hematology, Oncology Why:  Call Monday. Contact information: [place] [address] [phone]            Time coordinating discharge: 35 minutes.  SignedMargreta Journey Memori Sammon  Pager [phone] [facility] 02/28/2018, 1:54 PM

## 2018-02-28 NOTE — Progress Notes (Signed)
Pharmacy Antibiotic Note  [name] is a 77 y.o. female admitted on 02/27/2018 with possible diverticulitis  Today, 02/28/2018 Day #2 Vancomycin/Meropenem Tmax 100.3, afebrile this AM WBC 1.1 SCr 0.78, CrCl 53 ml/min Lactate 0.7 > 1.4  Plan: - Vancomycin dc'd - Increase Meropenem to 1g q8h - Monitor renal and cultures  Height: 5' 5"  (165.1 cm) Weight: 137 lb 9.1 oz (62.4 kg) IBW/kg (Calculated) : 57  Temp (24hrs), Avg:99.4 F (37.4 C), Min:98.3 F (36.8 C), Max:102.5 F (39.2 C)  Recent Labs  Lab 02/27/18 1606 02/27/18 1610 02/27/18 1810 02/27/18 2203 02/28/18 0028  WBC 1.4*  --   --   --  1.1*  CREATININE 0.88  --   --   --  0.78  LATICACIDVEN  --  1.16 0.81 0.7 1.4    Estimated Creatinine Clearance: 53.8 mL/min (by C-G formula based on SCr of 0.78 mg/dL).    Allergies  Allergen Reactions  . Sulfa Antibiotics Hives and Nausea And Vomiting  . Vytorin [Ezetimibe-Simvastatin]     White skin patch  . Cephalexin   . Doxycycline     Raw mouth   . Erythromycin     REACTION: weakness  . Macrobid [Nitrofurantoin Macrocrystal] Hives  . Morphine Nausea And Vomiting  . Sulfonamide Derivatives Hives and Nausea And Vomiting  . Amoxicillin Hives   Antimicrobials this admission:  11/16 Vanc >> 11/17 11/16 Meropenem >>  Dose adjustments this admission:  11/17 increase meropenem 1g q12h to q8h for CrCl > 50  Microbiology results:  11/16 BCx: 11/16 UCx:  Peggyann Juba, PharmD, BCPS Pager: [phone] 02/28/2018 9:15 AM

## 2018-02-28 NOTE — Care Management Note (Signed)
Case Management Note  Patient Details  Name: [name] MRN: [id number] Date of Birth: [birth year]  Subjective/Objective:  Sepsis. From home. Per Dr/UR nurse  Inpt status appropriate. No CM needs.                  Action/Plan:d/c home.  Expected Discharge Date:  02/28/18               Expected Discharge Plan:  Home/Self Care  In-House Referral:     Discharge planning Services  CM Consult  Post Acute Care Choice:    Choice offered to:     DME Arranged:    DME Agency:     HH Arranged:    HH Agency:     Status of Service:  Completed, signed off  If discussed at [employer] of Stay Meetings, dates discussed:    Additional Comments:  Dessa Phi, RN 02/28/2018, 11:47 AM

## 2018-02-28 NOTE — Discharge Instructions (Signed)
Diverticulitis Diverticulitis is infection or inflammation of small pouches (diverticula) in the colon that form due to a condition called diverticulosis. Diverticula can trap stool (feces) and bacteria, causing infection and inflammation. Diverticulitis may cause severe stomach pain and diarrhea. It may lead to tissue damage in the colon that causes bleeding. The diverticula may also burst (rupture) and cause infected stool to enter other areas of the abdomen. Complications of diverticulitis can include:  Bleeding.  Severe infection.  Severe pain.  Rupture (perforation) of the colon.  Blockage (obstruction) of the colon.  What are the causes? This condition is caused by stool becoming trapped in the diverticula, which allows bacteria to grow in the diverticula. This leads to inflammation and infection. What increases the risk? You are more likely to develop this condition if:  You have diverticulosis. The risk for diverticulosis increases if: ? You are overweight or obese. ? You use tobacco products. ? You do not get enough exercise.  You eat a diet that does not include enough fiber. High-fiber foods include fruits, vegetables, beans, nuts, and whole grains.  What are the signs or symptoms? Symptoms of this condition may include:  Pain and tenderness in the abdomen. The pain is normally located on the left side of the abdomen, but it may occur in other areas.  Fever and chills.  Bloating.  Cramping.  Nausea.  Vomiting.  Changes in bowel routines.  Blood in your stool.  How is this diagnosed? This condition is diagnosed based on:  Your medical history.  A physical exam.  Tests to make sure there is nothing else causing your condition. These tests may include: ? Blood tests. ? Urine tests. ? Imaging tests of the abdomen, including X-rays, ultrasounds, MRIs, or CT scans.  How is this treated? Most cases of this condition are mild and can be treated at home.  Treatment may include:  Taking over-the-counter pain medicines.  Following a clear liquid diet.  Taking antibiotic medicines by mouth.  Rest.  More severe cases may need to be treated at a hospital. Treatment may include:  Not eating or drinking.  Taking prescription pain medicine.  Receiving antibiotic medicines through an IV tube.  Receiving fluids and nutrition through an IV tube.  Surgery.  When your condition is under control, your health care provider may recommend that you have a colonoscopy. This is an exam to look at the entire large intestine. During the exam, a lubricated, bendable tube is inserted into the anus and then passed into the rectum, colon, and other parts of the large intestine. A colonoscopy can show how severe your diverticula are and whether something else may be causing your symptoms. Follow these instructions at home: Medicines  Take over-the-counter and prescription medicines only as told by your health care provider. These include fiber supplements, probiotics, and stool softeners.  If you were prescribed an antibiotic medicine, take it as told by your health care provider. Do not stop taking the antibiotic even if you start to feel better.  Do not drive or use heavy machinery while taking prescription pain medicine. General instructions  Follow a full liquid diet or another diet as directed by your health care provider. After your symptoms improve, your health care provider may tell you to change your diet. He or she may recommend that you eat a diet that contains at least 25 g (25 grams) of fiber daily. Fiber makes it easier to pass stool. Healthy sources of fiber include: ? Berries. One cup  contains 4-8 grams of fiber. ? Beans or lentils. One half cup contains 5-8 grams of fiber. ? Green vegetables. One cup contains 4 grams of fiber.  Exercise for at least 30 minutes, 3 times each week. You should exercise hard enough to raise your heart rate and  break a sweat.  Keep all follow-up visits as told by your health care provider. This is important. You may need a colonoscopy. Contact a health care provider if:  Your pain does not improve.  You have a hard time drinking or eating food.  Your bowel movements do not return to normal. Get help right away if:  Your pain gets worse.  Your symptoms do not get better with treatment.  Your symptoms suddenly get worse.  You have a fever.  You vomit more than one time.  You have stools that are bloody, black, or tarry. Summary  Diverticulitis is infection or inflammation of small pouches (diverticula) in the colon that form due to a condition called diverticulosis. Diverticula can trap stool (feces) and bacteria, causing infection and inflammation.  You are at higher risk for this condition if you have diverticulosis and you eat a diet that does not include enough fiber.  Most cases of this condition are mild and can be treated at home. More severe cases may need to be treated at a hospital.  When your condition is under control, your health care provider may recommend that you have an exam called a colonoscopy. This exam can show how severe your diverticula are and whether something else may be causing your symptoms. This information is not intended to replace advice given to you by your health care provider. Make sure you discuss any questions you have with your health care provider. Document Released: 01/08/2005 Document Revised: 05/03/2016 Document Reviewed: 05/03/2016 Elsevier Interactive Patient Education  [employer].

## 2018-03-01 LAB — URINE CULTURE: Culture: NO GROWTH

## 2018-03-04 ENCOUNTER — Encounter: Payer: Self-pay | Admitting: Internal Medicine

## 2018-03-04 ENCOUNTER — Ambulatory Visit (INDEPENDENT_AMBULATORY_CARE_PROVIDER_SITE_OTHER): Payer: Medicare HMO | Admitting: Internal Medicine

## 2018-03-04 ENCOUNTER — Telehealth: Payer: Self-pay | Admitting: Emergency Medicine

## 2018-03-04 ENCOUNTER — Ambulatory Visit (HOSPITAL_BASED_OUTPATIENT_CLINIC_OR_DEPARTMENT_OTHER)
Admission: RE | Admit: 2018-03-04 | Discharge: 2018-03-04 | Disposition: A | Discharge status: Home / Self Care | Payer: Medicare HMO | Source: Ambulatory Visit | Attending: Internal Medicine | Admitting: Internal Medicine

## 2018-03-04 VITALS — BP 110/74 | HR 96 | Resp 16 | Ht 65.0 in | Wt 132.0 lb

## 2018-03-04 DIAGNOSIS — K5732 Diverticulitis of large intestine without perforation or abscess without bleeding: Secondary | ICD-10-CM | POA: Insufficient documentation

## 2018-03-04 DIAGNOSIS — N200 Calculus of kidney: Secondary | ICD-10-CM | POA: Diagnosis not present

## 2018-03-04 DIAGNOSIS — I700 Atherosclerosis of aorta: Secondary | ICD-10-CM | POA: Insufficient documentation

## 2018-03-04 DIAGNOSIS — C8599 Non-Hodgkin lymphoma, unspecified, extranodal and solid organ sites: Secondary | ICD-10-CM

## 2018-03-04 LAB — CBC WITH DIFFERENTIAL/PLATELET
BASOS PCT: 1.9 % (ref 0.0–3.0)
Basophils Absolute: 0 10*3/uL (ref 0.0–0.1)
EOS PCT: 1.9 % (ref 0.0–5.0)
Eosinophils Absolute: 0 10*3/uL (ref 0.0–0.7)
HCT: 33 % — ABNORMAL LOW (ref 36.0–46.0)
Hemoglobin: 11.2 g/dL — ABNORMAL LOW (ref 12.0–15.0)
Lymphocytes Relative: 24.8 % (ref 12.0–46.0)
Lymphs Abs: 0.3 10*3/uL — ABNORMAL LOW (ref 0.7–4.0)
MCHC: 33.9 g/dL (ref 30.0–36.0)
MCV: 97.3 fl (ref 78.0–100.0)
MONOS PCT: 27.1 % — AB (ref 3.0–12.0)
Monocytes Absolute: 0.3 10*3/uL (ref 0.1–1.0)
NEUTROS ABS: 0.6 10*3/uL — AB (ref 1.4–7.7)
NEUTROS PCT: 44.3 % (ref 43.0–77.0)
PLATELETS: 283 10*3/uL (ref 150.0–400.0)
RBC: 3.39 Mil/uL — AB (ref 3.87–5.11)
RDW: 17.2 % — AB (ref 11.5–15.5)
WBC: 1.3 10*3/uL — CL (ref 4.0–10.5)

## 2018-03-04 LAB — COMPREHENSIVE METABOLIC PANEL
ALK PHOS: 50 U/L (ref 39–117)
ALT: 10 U/L (ref 0–35)
AST: 23 U/L (ref 0–37)
Albumin: 3.9 g/dL (ref 3.5–5.2)
BUN: 20 mg/dL (ref 6–23)
CHLORIDE: 108 meq/L (ref 96–112)
CO2: 26 mEq/L (ref 19–32)
Calcium: 9.6 mg/dL (ref 8.4–10.5)
Creatinine, Ser: 1 mg/dL (ref 0.40–1.20)
GFR: 57.24 mL/min — AB (ref >60.00)
GLUCOSE: 153 mg/dL — AB (ref 70–99)
POTASSIUM: 3.8 meq/L (ref 3.5–5.1)
Sodium: 140 mEq/L (ref 135–145)
TOTAL PROTEIN: 6.3 g/dL (ref 6.0–8.3)
Total Bilirubin: 0.3 mg/dL (ref 0.2–1.2)

## 2018-03-04 LAB — CULTURE, BLOOD (ROUTINE X 2)
Culture: NO GROWTH
Culture: NO GROWTH
SPECIAL REQUESTS: ADEQUATE
SPECIAL REQUESTS: ADEQUATE

## 2018-03-04 MED ORDER — METRONIDAZOLE 500 MG PO TABS: 500.0000 mg | ORAL_TABLET | Freq: Three times a day (TID) | ORAL | 0 refills | Status: DC

## 2018-03-04 MED ORDER — CIPROFLOXACIN HCL 500 MG PO TABS: 500.0000 mg | ORAL_TABLET | Freq: Two times a day (BID) | ORAL | 0 refills | Status: DC

## 2018-03-04 NOTE — Progress Notes (Signed)
Subjective:    Patient ID: [name], female    DOB: 26-Oct-1940, 77 y.o.   MRN: [id number]  DOS:  03/04/2018 Type of visit - description :  Hospital follow-up, no TCM  Patient was admitted to the hospital and discharged 02/28/2018. She has had a history of large B-cell lymphoma, s/p chemo-XRT ( last XRT  few weeks ago); also  multiple other problems including Crohn's disease, multiple sclerosis, memory difficulties. Presented to the hospital with abdominal pain similar to previous episodes of diverticulitis.  Upon admission, WBC was 1.4 with  neutropenia.  CT consistent with acute diverticulitis.  The left CT renal imaging was also abnormal: could reflect renal infarct or focal pyelonephritis. She was placed on broad-spectrum antibiotics, according to the notes she improved better than anticipated, and pt preferred to be discharged even before the white cells came up. Was recommended to be on Cipro, Flagyl and a soon oncology follow-up. Labs reviewed  K+ 3.0, last creatinine 0.7.  LFTs normal. Last white count 1.1.  Hemoglobin 9.8, platelets 190. Blood culture x2 and urine culture negative   Review of Systems Since she left the hospital, she is taking antibiotics as prescribed. Initially abdominal pain got worse but now is increasing.  Currently the pain is a steady, 5/10. No fever chills, has not been taking any Tylenol Mild nausea no vomiting. BMs are regular, approximately 3 or 4 movements daily which is normal for her.  The stools are slightly loose but no blood or mucus. Mental status at baseline, she is somewhat forgetful.   Past Medical History:  Diagnosis Date  . Arthritis, degenerative    both knees  . B12 deficiency 07/06/2014  . Cancer ([place])   . Colitis   . Diverticulosis   . Duodenitis   . Fatty liver   . Gastritis   . Helicobacter pylori gastritis   . Hyperlipidemia   . Hypertension    not treated  . IBD (inflammatory bowel disease)   . Lymphoma ([place])     . Memory difficulty 01/05/2013  . Memory loss    mild  . Multiple sclerosis ([place])   . Osteopenia   . Vitamin B12 deficiency     Past Surgical History:  Procedure Laterality Date  . ABDOMINAL HYSTERECTOMY  2010  . BLADDER SURGERY  2010   Bladder Tack  . CERVICAL SPINE SURGERY    . CHOLECYSTECTOMY    . HEMORRHOID SURGERY    . ROTATOR CUFF REPAIR    . SPINAL FUSION    . TUBAL LIGATION      Social History   Socioeconomic History  . Marital status: Married    Spouse name: Eduard Clos   . Number of children: 4  . Years of education: 12+  . Highest education level: Not on file  Occupational History  . Occupation: Retired     Fish farm manager: [employer]  . Financial resource strain: Not on file  . Food insecurity:    Worry: Not on file    Inability: Not on file  . Transportation needs:    Medical: Not on file    Non-medical: Not on file  Tobacco Use  . Smoking status: Former Smoker    Last attempt to quit: 06/27/1997    Years since quitting: 20.6  . Smokeless tobacco: Never Used  Substance and Sexual Activity  . Alcohol use: No  . Drug use: No  . Sexual activity: Not on file  Lifestyle  .  Physical activity:    Days per week: Not on file    Minutes per session: Not on file  . Stress: Not on file  Relationships  . Social connections:    Talks on phone: Not on file    Gets together: Not on file    Attends religious service: Not on file    Active member of club or organization: Not on file    Attends meetings of clubs or organizations: Not on file    Relationship status: Not on file  . Intimate partner violence:    Fear of current or ex partner: Not on file    Emotionally abused: Not on file    Physically abused: Not on file    Forced sexual activity: Not on file  Other Topics Concern  . Not on file  Social History Narrative   Caffeine daily.    Patient lives at home with husband Eduard Clos but goes by [employer].    Patient has 4  children.    Patient has 1 year of college.    Patient is retired.    Patient is left handed.    Patient drinks 2 cups of coffee per day.      Allergies as of 03/04/2018      Reactions   Sulfa Antibiotics Hives, Nausea And Vomiting   Vytorin [ezetimibe-simvastatin]    White skin patch   Cephalexin    Doxycycline    Raw mouth    Erythromycin    REACTION: weakness   Macrobid [nitrofurantoin Macrocrystal] Hives   Morphine Nausea And Vomiting   Sulfonamide Derivatives Hives, Nausea And Vomiting   Amoxicillin Hives      Medication List        Accurate as of 03/04/18  2:48 PM. Always use your most recent med list.          acetaminophen 325 MG tablet Commonly known as:  TYLENOL Take 2 tablets (650 mg total) by mouth every 6 (six) hours as needed for mild pain or fever (or Fever >/= 101).   b complex vitamins tablet Take 1 tablet by mouth daily.   ciprofloxacin 500 MG tablet Commonly known as:  CIPRO Take 1 tablet (500 mg total) by mouth 2 (two) times daily for 10 days.   desonide 0.05 % lotion Commonly known as:  DESOWEN Apply topically 2 (two) times daily. Use as needed for itchy ears   fenofibrate 145 MG tablet Commonly known as:  TRICOR Take 1 tablet (145 mg total) by mouth daily.   fluticasone 50 MCG/ACT nasal spray Commonly known as:  FLONASE Place 2 sprays into both nostrils as needed for allergies.   gabapentin 600 MG tablet Commonly known as:  NEURONTIN TAKE ONE TABLET BY MOUTH THREE TIMES A DAY   Glucosamine 500 MG Tabs Take 1 tablet by mouth 2 (two) times daily.   loratadine 10 MG tablet Commonly known as:  CLARITIN Take 1 tablet (10 mg total) by mouth daily as needed for allergies.   LORazepam 1 MG tablet Commonly known as:  ATIVAN Take 1 mg by mouth every 12 (twelve) hours as needed for sleep.   lovastatin 40 MG tablet Commonly known as:  MEVACOR Take 1 tablet (40 mg total) by mouth 2 (two) times daily.   megestrol 40 MG tablet Commonly  known as:  MEGACE Take 40 mg by mouth 2 (two) times daily.   memantine 10 MG tablet Commonly known as:  NAMENDA TAKE ONE TABLET BY MOUTH TWICE A DAY  metroNIDAZOLE 500 MG tablet Commonly known as:  FLAGYL Take 1 tablet (500 mg total) by mouth 3 (three) times daily for 14 days.   multivitamin tablet Take 1 tablet by mouth daily.   pegfilgrastim 6 MG/0.6ML injection Commonly known as:  NEULASTA ONPRO KIT Inject 6 mg into the skin once. Inject via provided programmed delivery device.   potassium chloride SA 20 MEQ tablet Commonly known as:  K-DUR,KLOR-CON Take 2 tablets (40 mEq total) by mouth 2 (two) times daily.   PROBIOTIC FORMULA PO Take 1 tablet by mouth daily.   valACYclovir 500 MG tablet Commonly known as:  VALTREX Take 500 mg by mouth daily.   Vitamin D3 125 MCG (5000 UT) Tabs Take 5,000 Units by mouth daily.           Objective:   Physical Exam BP 110/74 (BP Location: Left Arm, Patient Position: Sitting, Cuff Size: Normal)   Pulse 96   Resp 16   Ht 5' 5"  (1.651 m)   Wt 132 lb (59.9 kg)   SpO2 96%   BMI 21.97 kg/m   General:   Well developed, NAD, BMI noted.  HEENT:  Normocephalic . Face symmetric, atraumatic Lungs:  CTA B Normal respiratory effort, no intercostal retractions, no accessory muscle use. Heart: RRR,  no murmur.  no pretibial edema bilaterally  Abdomen:  Not distended, soft, tender, left lower quadrant.  No mass or rebound Skin: Not pale. Not jaundice Neurologic:  alert & oriented X3.  Speech normal, gait appropriate for age and unassisted Psych--  Cognition and judgment appear intact.  Cooperative with normal attention span and concentration.  Behavior appropriate. No anxious or depressed appearing.     Assessment & Plan:   77 year old female with multiple medical problems including leukopenia presents for a follow-up on diverticulitis.  Diverticulitis: Documented by CT, status post IV antibiotics, now on Cipro and Flagyl.   After the hospital stay she originally improved but now the pain is coming back, is steady @ 5/10. This is somewhat concerning for a immunosuppressed patient. Plan: Stat CBC, CMP and CT abdomen and pelvis. If CT stable, okay to continue with outpatient treatment if not will recommend ER evaluation, readmission? Continue antibiotics for a total of 10 days Even if CT is normal, if she gets worse she needs to go to the ER. Hypokalemia: Status post KCl x4 tablets.  Rechecking it today. History of lymphoma.  Patient's husband reports that he contacted the oncology office and they were told it was okay to keep the appointment for 03/25/2018. Recommend follow-up with PCP next week

## 2018-03-04 NOTE — Progress Notes (Signed)
Pre visit review using our clinic review tool, if applicable. No additional management support is needed unless otherwise documented below in the visit note. 

## 2018-03-04 NOTE — Patient Instructions (Signed)
GO TO THE LAB : Get the blood work       STOP BY THE FIRST FLOOR:   Proceed with the CT scan   Take both antibiotics for a total of 10 days  ER if: Fever, chills, feeling worse, increased abdominal pain.

## 2018-03-04 NOTE — Telephone Encounter (Signed)
"  CRITICAL VALUE STICKER  CRITICAL VALUE:Wt. Count 1.3  RECEIVER (on-site recipient of call):Leamon Palau P  DATE & TIME NOTIFIED: 03/04/18 1639  MESSENGER (representative from lab):Karen  MD NOTIFIED: Paz/Kaylyn  TIME OF NOTIFICATION:1940  RESPONSE: Vilma Prader stated patient is getting Chemotherapy and she will report to Dr. Larose Kells

## 2018-03-05 NOTE — Telephone Encounter (Signed)
Addendum: I spoke with Dr. Mina Marble. For now we agreed to check a CBC weekly x2. Next CBC 03/09/2018. We will fax results and also text her . Next appointment with oncology will be 03/25/2018. Please notify patient of above

## 2018-03-05 NOTE — Telephone Encounter (Signed)
Tried calling Pt- no answer. Will try again later. If Pt returns call- not okay for [facility] to discuss.

## 2018-03-05 NOTE — Telephone Encounter (Signed)
I already discussed the results of the blood work & CT with the patient. I called Dr.J Mina Marble and and left a detailed  message with 1 of her staff members, letting her know that she had diverticulitis and her WBCs are still low. In addition will fax the discharge summary and the most recent labs. [place]. Please let the patient know about the above

## 2018-03-05 NOTE — Telephone Encounter (Signed)
MD verbally informed yesterday as well.

## 2018-03-05 NOTE — Telephone Encounter (Signed)
Records faxed.

## 2018-03-05 NOTE — Telephone Encounter (Signed)
Spoke w/ Pt- informed of WBC results and recommendations. Pt has appt scheduled w/ Dr. Lorelei Pont on 03/08/2018.

## 2018-03-06 NOTE — Progress Notes (Addendum)
[facility] [address], [place], [place] [address] [phone]  Date:  03/08/2018   Name:  [name]   DOB:  07-May-1940   MRN:  [id number]  PCP:  Darreld Mclean, MD    Chief Complaint: Hospitalization Follow-up (diverticultis, feeling some better)   History of Present Illness:  [name] is a 77 y.o. very pleasant female patient who presents with the following:  Following up from hospital stay- recently admitted with diverticulitis and sepsis, complicated by recnet chemotherapy and radiation for lymphoma   Admit date: 02/27/2018 Discharge date: 02/28/2018  Recommendations for Outpatient Follow-Up:   1. Patient will call her oncologist 03/01/2018 for further treatment. 2. Please follow-up final blood culture results.  Negative to date.  Discharge Diagnosis:   Principal Problem:   Diverticulitis complicated by immunosuppression Active Problems:   Memory difficulty   Leukopenia   Sepsis ([place])   Diffuse large B-cell lymphoma ([facility])   Hypokalemia Code status: Full.  History of Present Illness:   Vicki V Higginsis an 77 y.o.femalewith a PMH of NHL, renal masses, status post R CHOP with intrathecal methotrexate, now undergoing consolidationradiation therapy to bulky disease of the left kidney, Crohn's disease, multiple sclerosis, foot drop associated with chemo who was admitted 02/27/2018 with abdominal pain similar in quality to a prior episode of diverticulitis, associated with fever. In the ED, WBC 1.4 with neutropenia, CT with evidence of acute diverticulitis.  Hospital Course by Problem:   Principal Problem: Sepsis secondary to diverticulitiscomplicated by immune suppression/neutropenia Patient placed on broad-spectrum antibiotics including meropenem and vancomycin while awaiting blood culture results. Discontinued vancomycin as this is not likely to cause diverticulitis.   Patient improved quicker than would have been anticipated on admission, and actually preferred discharge to being kept in the hospital longer awaiting white blood cell count recovery and after discussion with her and her husband, was found to have close follow-up with oncologist and therefore was discharged home on oral Cipro/Flagyl.  She was nontoxic-appearing and felt to be stable for discharge home on oral antibiotics.  Blood cultures negative to date. Active Problems: Memory difficulty Stable. Leukopenia Patient will call her oncologist in the morning to see if there is any role for treatment with Neupogen or a colony-stimulating factor. Diffuse large B-cell lymphoma ([place]) Has not had any chemotherapy since September.  We will follow-up with her oncologist in the morning. Hypokalemia 40 mEq p.o. twice daily x2 doses ordered for today.  She was then in and saw Dr. Larose Kells just recently and was doing better- however her wbc count was still critically low.  Dr. Larose Kells consulted with her oncologist -plan to repeat weekly CBC for 2 weeks. Will do for her today   She continues to have low energy but is overall improving  She is eating normally now, but is having more frequent and loose/diarrhea stools.  She is still on abx for 2-3 more days - cipro and flagyl She is tolerating well except for diarrhea as above She had a temp of 99.3 2 days ago, resolved with tylneol Has not recurred, they have been monitoring her temp Overall she feels like she is on the right track Here today with her husband who contributes to the history   BP Readings from Last 3 Encounters:  03/08/18 110/70  03/04/18 110/74  02/28/18 139/84   Pulse Readings from Last 3 Encounters:  03/08/18 (!) 105  03/04/18 96  02/28/18  92   She also had a repeat CT scan done on 11/21 as follows:  IMPRESSION: 1. Considerable improvement in subacute sigmoid diverticulitis involving the proximal sigmoid colon. Mild colonic wall  thickening remains. Recommend follow-up CT (4-6 weeks) or colonoscopy to exclude underlying lesion. 2. No new diverticulitis. 3. LEFT renal calculi which are nonobstructing. 4.  Aortic Atherosclerosis (ICD10-I70.0).  Discussed with pt. She would prefer to do a CT as opposed to a colonoscopy, I will order this for her for the end of December  [facility] care [facility]- reviewed recent notes oncologist is Dr. Rogers Blocker Radiation oncologist is Dr. Chelsea Aus She was dx with diffuse large B cell lymphoma in the bilateral kidneys.  Mass first noted on CT in March of this year, Had tissue bx the same month  She has now completed 6 cycles of CHOP chemo She is now in remission but is getting radiation She is to see Dr. Mina Marble in December As per previous notes, Dr. Mina Marble was made aware of her CBC results from 11/21- she had requested that we repeat CBC weekly x2   Patient Active Problem List   Diagnosis Date Noted  . Hypokalemia 02/28/2018  . Sepsis ([place]) 02/27/2018  . Diverticulitis 02/27/2018  . Diffuse large B-cell lymphoma ([place]) 02/27/2018  . Osteoarthritis of cervical spine 09/15/2016  . Abnormality of gait 11/07/2015  . Dizziness and giddiness 11/07/2015  . Mild cognitive impairment 01/09/2015  . B12 deficiency 07/06/2014  . Leukopenia 05/06/2013  . Memory difficulty 01/05/2013  . HYPERLIPIDEMIA 11/28/2008  . Multiple sclerosis ([place]) 11/28/2008  . INFLAMMATORY BOWEL DISEASE 11/28/2008  . DIVERTICULOSIS, COLON 11/28/2008  . FATTY LIVER DISEASE 11/28/2008  . PERSONAL HISTORY OF PEPTIC ULCER DISEASE 11/28/2008    Past Medical History:  Diagnosis Date  . Arthritis, degenerative    both knees  . B12 deficiency 07/06/2014  . Cancer ([place])   . Colitis   . Diverticulosis   . Duodenitis   . Fatty liver   . Gastritis   . Helicobacter pylori gastritis   . Hyperlipidemia   . Hypertension    not treated  . IBD (inflammatory bowel disease)   . Lymphoma ([place])   . Memory  difficulty 01/05/2013  . Memory loss    mild  . Multiple sclerosis ([employer])   . Osteopenia   . Vitamin B12 deficiency     Past Surgical History:  Procedure Laterality Date  . ABDOMINAL HYSTERECTOMY  2010  . BLADDER SURGERY  2010   Bladder Tack  . CERVICAL SPINE SURGERY    . CHOLECYSTECTOMY    . HEMORRHOID SURGERY    . ROTATOR CUFF REPAIR    . SPINAL FUSION    . TUBAL LIGATION      Social History   Tobacco Use  . Smoking status: Former Smoker    Last attempt to quit: 06/27/1997    Years since quitting: 20.7  . Smokeless tobacco: Never Used  Substance Use Topics  . Alcohol use: No  . Drug use: No    Family History  Problem Relation Age of Onset  . Diabetes Father   . Heart disease Father   . Colon polyps Mother   . Breast cancer Cousin   . Colon cancer Maternal Grandmother        ? may be duodenal cancer  . Colon polyps Brother   . Hypertension Brother     Allergies  Allergen Reactions  . Sulfa Antibiotics Hives and Nausea And Vomiting  .  Vytorin [Ezetimibe-Simvastatin]     White skin patch  . Cephalexin   . Doxycycline     Raw mouth   . Erythromycin     REACTION: weakness  . Macrobid [Nitrofurantoin Macrocrystal] Hives  . Morphine Nausea And Vomiting  . Sulfonamide Derivatives Hives and Nausea And Vomiting  . Amoxicillin Hives    Medication list has been reviewed and updated.  Current Outpatient Medications on File Prior to Visit  Medication Sig Dispense Refill  . acetaminophen (TYLENOL) 325 MG tablet Take 2 tablets (650 mg total) by mouth every 6 (six) hours as needed for mild pain or fever (or Fever >/= 101).    [name] Kitchen b complex vitamins tablet Take 1 tablet by mouth daily.    . Cholecalciferol (VITAMIN D3) 5000 units TABS Take 5,000 Units by mouth daily.     . ciprofloxacin (CIPRO) 500 MG tablet Take 1 tablet (500 mg total) by mouth 2 (two) times daily. 6 tablet 0  . desonide (DESOWEN) 0.05 % lotion Apply topically 2 (two) times daily. Use as needed for  itchy ears (Patient taking differently: Apply 1 application topically as needed (itch ears). Use as needed for itchy ears) 59 mL 1  . fenofibrate (TRICOR) 145 MG tablet Take 1 tablet (145 mg total) by mouth daily. 90 tablet 3  . fluticasone (FLONASE) 50 MCG/ACT nasal spray Place 2 sprays into both nostrils as needed for allergies.     [name] Kitchen gabapentin (NEURONTIN) 600 MG tablet TAKE ONE TABLET BY MOUTH THREE TIMES A DAY 270 tablet 0  . Glucosamine 500 MG TABS Take 1 tablet by mouth 2 (two) times daily.     [name] Kitchen loratadine (CLARITIN) 10 MG tablet Take 1 tablet (10 mg total) by mouth daily as needed for allergies.    [name] Kitchen LORazepam (ATIVAN) 1 MG tablet Take 1 mg by mouth every 12 (twelve) hours as needed for sleep.     [name] Kitchen lovastatin (MEVACOR) 40 MG tablet Take 1 tablet (40 mg total) by mouth 2 (two) times daily. 90 tablet 3  . memantine (NAMENDA) 10 MG tablet TAKE ONE TABLET BY MOUTH TWICE A DAY 180 tablet 3  . metroNIDAZOLE (FLAGYL) 500 MG tablet Take 1 tablet (500 mg total) by mouth 3 (three) times daily. 9 tablet 0  . Multiple Vitamin (MULTIVITAMIN) tablet Take 1 tablet by mouth daily.     . Probiotic Product (PROBIOTIC FORMULA PO) Take 1 tablet by mouth daily.      . valACYclovir (VALTREX) 500 MG tablet Take 500 mg by mouth daily.     No current facility-administered medications on file prior to visit.     Review of Systems:  As per HPI- otherwise negative. Pulse Readings from Last 3 Encounters:  03/08/18 90  03/04/18 96  02/28/18 92   BP Readings from Last 3 Encounters:  03/08/18 110/70  03/04/18 110/74  02/28/18 139/84     Physical Examination: Vitals:   03/08/18 1153  BP: 110/70  Pulse: (!) 105  Resp: 16  Temp: 98.1 F (36.7 C)   Vitals:   03/08/18 1153  Weight: 131 lb (59.4 kg)  Height: 5' 5"  (1.651 m)   Body mass index is 21.8 kg/m. Ideal Body Weight: Weight in (lb) to have BMI = 25: 149.9  GEN: WDWN, NAD, Non-toxic, A & O x 3, normal weight, looks well  HEENT:  Atraumatic, Normocephalic. Neck supple. No masses, No LAD. Bilateral TM wnl, oropharynx normal.  PEERL,EOMI.   Ears and Nose: No external deformity. CV:  RRR, No M/G/R. No JVD. No thrill. No extra heart sounds. PULM: CTA B, no wheezes, crackles, rhonchi. No retractions. No resp. distress. No accessory muscle use.  ABD: S, NT, ND, +BS. No rebound. No HSM. Benign belly today  EXTR: No c/c/e NEURO Normal gait.  PSYCH: Normally interactive. Conversant. Not depressed or anxious appearing.  Calm demeanor.    Assessment and Plan: Leukopenia, unspecified type - Plan: CBC with Differential/Platelet, CBC with Differential/Platelet  Diarrhea, unspecified type - Plan: C. difficile GDH and Toxin A/B, CANCELED: C. difficile GDH and Toxin A/B  Diverticulitis of large intestine without perforation or abscess without bleeding - Plan: CT Abdomen Pelvis Wo Contrast  Following up from hospital stay with diverticulitis, complicated by compromised immune system/ NHL She is doing better but does have some diarrhea likely due to abx use.  Given her fragile state will check a C diff for her. Will continue abx for now- she has 2 days left CBC today- repeat in one more week as well, lab visit only  They will continue to monitor for any fever and will alert me of any fever over 100, or a persistent temp over 99.5 Ordered CT abd/pelvis without for the end of December for her at the [place], MD   Received her labs about 5:10 pm as a stat- her wbc count is quite low but stable Discussed with her husband over the phone-  As there is really no change I will call Dr. Mina Marble tomorrow and touch base with her.  It is after 5 so likely cannot catch her in the office now Results for orders placed or performed in visit on 03/08/18  CBC with Differential/Platelet  Result Value Ref Range   WBC 1.2 (LL) 4.0 - 10.5 K/uL   RBC 3.49 (L) 3.87 - 5.11 Mil/uL   Hemoglobin 11.1 (L) 12.0 - 15.0 g/dL   HCT 33.5  (L) 36.0 - 46.0 %   MCV 96.0 78.0 - 100.0 fl   MCHC 33.2 30.0 - 36.0 g/dL   RDW 17.4 (H) 11.5 - 15.5 %   Platelets 338.0 150.0 - 400.0 K/uL   Neutrophils Relative % 39.9 (L) 43.0 - 77.0 %   Lymphocytes Relative 27.9 12.0 - 46.0 %   Monocytes Relative 28.3 (H) 3.0 - 12.0 %   Eosinophils Relative 2.0 0.0 - 5.0 %   Basophils Relative 1.9 0.0 - 3.0 %   Neutro Abs 0.5 (L) 1.4 - 7.7 K/uL   Lymphs Abs 0.3 (L) 0.7 - 4.0 K/uL   Monocytes Absolute 0.3 0.1 - 1.0 K/uL   Eosinophils Absolute 0.0 0.0 - 0.7 K/uL   Basophils Absolute 0.0 0.0 - 0.1 K/uL  11/26 1:30 pm Called and spoke with Dr. Mina Marble- as counts are about the same no action needed now.  Will fax results to her and have pt come in for a CBC in one week as planned  Call pt to give this update and LMOM with details for her   Rogers Blocker, MD  [address]  [place], [place] [address]  [phone]  [phone] (Fax)

## 2018-03-08 ENCOUNTER — Telehealth: Payer: Self-pay | Admitting: *Deleted

## 2018-03-08 ENCOUNTER — Encounter: Payer: Self-pay | Admitting: Family Medicine

## 2018-03-08 ENCOUNTER — Ambulatory Visit (INDEPENDENT_AMBULATORY_CARE_PROVIDER_SITE_OTHER): Payer: Medicare HMO | Admitting: Family Medicine

## 2018-03-08 VITALS — BP 110/70 | HR 90 | Temp 98.1°F | Resp 16 | Ht 65.0 in | Wt 131.0 lb

## 2018-03-08 DIAGNOSIS — R197 Diarrhea, unspecified: Secondary | ICD-10-CM

## 2018-03-08 DIAGNOSIS — D72819 Decreased white blood cell count, unspecified: Secondary | ICD-10-CM | POA: Diagnosis not present

## 2018-03-08 DIAGNOSIS — K5732 Diverticulitis of large intestine without perforation or abscess without bleeding: Secondary | ICD-10-CM

## 2018-03-08 LAB — CBC WITH DIFFERENTIAL/PLATELET
BASOS PCT: 1.9 % (ref 0.0–3.0)
Basophils Absolute: 0 10*3/uL (ref 0.0–0.1)
EOS ABS: 0 10*3/uL (ref 0.0–0.7)
EOS PCT: 2 % (ref 0.0–5.0)
HEMATOCRIT: 33.5 % — AB (ref 36.0–46.0)
HEMOGLOBIN: 11.1 g/dL — AB (ref 12.0–15.0)
Lymphocytes Relative: 27.9 % (ref 12.0–46.0)
Lymphs Abs: 0.3 10*3/uL — ABNORMAL LOW (ref 0.7–4.0)
MCHC: 33.2 g/dL (ref 30.0–36.0)
MCV: 96 fl (ref 78.0–100.0)
MONO ABS: 0.3 10*3/uL (ref 0.1–1.0)
MONOS PCT: 28.3 % — AB (ref 3.0–12.0)
Neutro Abs: 0.5 10*3/uL — ABNORMAL LOW (ref 1.4–7.7)
Neutrophils Relative %: 39.9 % — ABNORMAL LOW (ref 43.0–77.0)
PLATELETS: 338 10*3/uL (ref 150.0–400.0)
RBC: 3.49 Mil/uL — ABNORMAL LOW (ref 3.87–5.11)
RDW: 17.4 % — ABNORMAL HIGH (ref 11.5–15.5)
WBC: 1.2 10*3/uL — AB (ref 4.0–10.5)

## 2018-03-08 NOTE — Addendum Note (Signed)
Addended by: Kelle Darting A on: 03/08/2018 01:22 PM   Modules accepted: Orders

## 2018-03-08 NOTE — Telephone Encounter (Signed)
Noted and handled, thank you

## 2018-03-08 NOTE — Patient Instructions (Signed)
Good to see you- I am glad that you are feeling better!  Please stop by the lab for a blood draw and to pick up the C diff test I will be in touch with your reports asap [name] out your current antibiotics I will order a CT scan for you for the end of December If you have any symptoms of worsening please alert me!

## 2018-03-08 NOTE — Telephone Encounter (Signed)
CRITICAL VALUE STICKER  CRITICAL VALUE: WBC 1.2  RECEIVER (on-site recipient of call): Kelle Darting, [place] NOTIFIED: 03/08/18, 4:39pm  MESSENGER (representative from lab): Rosie Fate  MD NOTIFIED: Dr Lorelei Pont  TIME OF NOTIFICATION: 4:39pm  RESPONSE:

## 2018-03-09 LAB — C. DIFFICILE GDH AND TOXIN A/B
GDH ANTIGEN: NOT DETECTED
MICRO NUMBER: 91418508
SPECIMEN QUALITY:: ADEQUATE
TOXIN A AND B: NOT DETECTED

## 2018-03-15 ENCOUNTER — Telehealth: Payer: Self-pay | Admitting: *Deleted

## 2018-03-15 ENCOUNTER — Other Ambulatory Visit (INDEPENDENT_AMBULATORY_CARE_PROVIDER_SITE_OTHER): Payer: Medicare HMO

## 2018-03-15 DIAGNOSIS — D72819 Decreased white blood cell count, unspecified: Secondary | ICD-10-CM | POA: Diagnosis not present

## 2018-03-15 LAB — CBC WITH DIFFERENTIAL/PLATELET
BASOS ABS: 0 10*3/uL (ref 0.0–0.1)
Basophils Relative: 1.7 % (ref 0.0–3.0)
EOS ABS: 0 10*3/uL (ref 0.0–0.7)
Eosinophils Relative: 2.7 % (ref 0.0–5.0)
HEMATOCRIT: 35.9 % — AB (ref 36.0–46.0)
HEMOGLOBIN: 11.9 g/dL — AB (ref 12.0–15.0)
Lymphocytes Relative: 30.3 % (ref 12.0–46.0)
Lymphs Abs: 0.3 10*3/uL — ABNORMAL LOW (ref 0.7–4.0)
MCHC: 33.2 g/dL (ref 30.0–36.0)
MCV: 95.8 fl (ref 78.0–100.0)
Monocytes Absolute: 0.2 10*3/uL (ref 0.1–1.0)
Monocytes Relative: 21.6 % — ABNORMAL HIGH (ref 3.0–12.0)
Neutro Abs: 0.5 10*3/uL — ABNORMAL LOW (ref 1.4–7.7)
Neutrophils Relative %: 43.7 % (ref 43.0–77.0)
Platelets: 258 10*3/uL (ref 150.0–400.0)
RBC: 3.75 Mil/uL — ABNORMAL LOW (ref 3.87–5.11)
RDW: 17.6 % — AB (ref 11.5–15.5)

## 2018-03-15 NOTE — Telephone Encounter (Signed)
CRITICAL VALUE STICKER  CRITICAL VALUE: WBC 1.1 RECEIVER (on-site recipient of call): Kelle Darting, [place] NOTIFIED: 03/15/18 @ 12:30pm  MESSENGER (representative from lab): Santiago Glad  MD NOTIFIED: Dr Lorelei Pont  TIME OF NOTIFICATION: 12:31pm  RESPONSE:

## 2018-03-15 NOTE — Telephone Encounter (Signed)
Received update regarding stat labs Called [facility] where she is treated for her lymphoma She was admitted last month with diverticulitis complicated by immunosuppression Gave a message for Dr. Mina Marble to call me to front desk and also spoke to triage nurse as this is a blood count issue. Faxed labs to [place]- fax number [phone]  Mel Almond called to check on pt- she was out but we spoke to her husband Eduard Clos. No fever or any other acute sx,but she is still feeling tired  He requests that I call him if I hear from DR. Wang at [phone]

## 2018-03-16 NOTE — Telephone Encounter (Signed)
Called [facility] again to make sure the labs were received.  I am assured that they got my fax and gave rsults to Dr. Mina Marble.  Requested that Dr. Leland Her staff call the pt and let them know labs are received and communicate any instructions  Called Charlie and gave this update  He reports that Barre is doing ok, no fever He will let me know if he does not hear from [place] by tomorrow

## 2018-03-22 ENCOUNTER — Other Ambulatory Visit: Payer: Self-pay | Admitting: Neurology

## 2018-03-25 DIAGNOSIS — D72819 Decreased white blood cell count, unspecified: Secondary | ICD-10-CM | POA: Diagnosis not present

## 2018-03-25 DIAGNOSIS — C8339 Diffuse large B-cell lymphoma, extranodal and solid organ sites: Secondary | ICD-10-CM | POA: Diagnosis not present

## 2018-04-05 DIAGNOSIS — C8339 Diffuse large B-cell lymphoma, extranodal and solid organ sites: Secondary | ICD-10-CM | POA: Diagnosis not present

## 2018-04-15 ENCOUNTER — Ambulatory Visit: Payer: Medicare HMO | Admitting: Neurology

## 2018-04-15 ENCOUNTER — Encounter: Payer: Self-pay | Admitting: Neurology

## 2018-04-15 VITALS — BP 104/64 | HR 90 | Ht 65.0 in | Wt 129.0 lb

## 2018-04-15 DIAGNOSIS — G35 Multiple sclerosis: Secondary | ICD-10-CM

## 2018-04-15 NOTE — Progress Notes (Signed)
Reason for visit: Multiple sclerosis  [name] is an 78 y.o. female  History of present illness:  [name] is a 78 year old left-handed white female with a history of multiple sclerosis.  The patient had been treated with Gilenya, but she was diagnosed with a B-cell lymphoma and came off the drug.  The patient has been on R-CHOP chemotherapy, she last got rituximab in October 2019.  The patient was evaluated in the summer 2019 for a left-sided foot drop, MRI of the brain did not show any new lesions, a carotid Doppler study was unremarkable.  The patient has resolved with the left foot drop.  She does cross her left leg over the right.  The patient reports no changes in balance, she has not had any new vision changes or new numbness or weakness.  She returns for an evaluation.  Past Medical History:  Diagnosis Date  . Arthritis, degenerative    both knees  . B12 deficiency 07/06/2014  . Cancer ([place])   . Colitis   . Diverticulosis   . Duodenitis   . Fatty liver   . Gastritis   . Helicobacter pylori gastritis   . Hyperlipidemia   . Hypertension    not treated  . IBD (inflammatory bowel disease)   . Lymphoma ([place])   . Memory difficulty 01/05/2013  . Memory loss    mild  . Multiple sclerosis ([place])   . Osteopenia   . Vitamin B12 deficiency     Past Surgical History:  Procedure Laterality Date  . ABDOMINAL HYSTERECTOMY  2010  . BLADDER SURGERY  2010   Bladder Tack  . CERVICAL SPINE SURGERY    . CHOLECYSTECTOMY    . HEMORRHOID SURGERY    . ROTATOR CUFF REPAIR    . SPINAL FUSION    . TUBAL LIGATION      Family History  Problem Relation Age of Onset  . Diabetes Father   . Heart disease Father   . Colon polyps Mother   . Breast cancer Cousin   . Colon cancer Maternal Grandmother        ? may be duodenal cancer  . Colon polyps Brother   . Hypertension Brother     Social history:  reports that she quit smoking about 20 years ago. She has never used  smokeless tobacco. She reports that she does not drink alcohol or use drugs.    Allergies  Allergen Reactions  . Sulfa Antibiotics Hives and Nausea And Vomiting  . Vytorin [Ezetimibe-Simvastatin]     White skin patch  . Cephalexin   . Doxycycline     Raw mouth   . Erythromycin     REACTION: weakness  . Macrobid [Nitrofurantoin Macrocrystal] Hives  . Morphine Nausea And Vomiting  . Sulfonamide Derivatives Hives and Nausea And Vomiting  . Amoxicillin Hives    Medications:  Prior to Admission medications   Medication Sig Start Date End Date Taking? Authorizing Provider  acetaminophen (TYLENOL) 325 MG tablet Take 2 tablets (650 mg total) by mouth every 6 (six) hours as needed for mild pain or fever (or Fever >/= 101). 02/28/18  Yes Rama, Venetia Maxon, MD  b complex vitamins tablet Take 1 tablet by mouth daily.   Yes [provider]  Cholecalciferol (VITAMIN D3) 5000 units TABS Take 5,000 Units by mouth daily.    Yes [provider]  desonide (DESOWEN) 0.05 % lotion Apply topically 2 (two) times daily. Use as needed for itchy ears  Patient taking differently: Apply 1 application topically as needed (itch ears). Use as needed for itchy ears 11/23/17  Yes Copland, Gay Filler, MD  fenofibrate (TRICOR) 145 MG tablet Take 1 tablet (145 mg total) by mouth daily. 11/23/17  Yes Copland, Gay Filler, MD  fluticasone (FLONASE) 50 MCG/ACT nasal spray Place 2 sprays into both nostrils as needed for allergies.    Yes [provider]  gabapentin (NEURONTIN) 600 MG tablet TAKE ONE TABLET BY MOUTH THREE TIMES A DAY 03/22/18  Yes Kathrynn Ducking, MD  Glucosamine 500 MG TABS Take 1 tablet by mouth 2 (two) times daily.    Yes [provider]  loratadine (CLARITIN) 10 MG tablet Take 1 tablet (10 mg total) by mouth daily as needed for allergies. 02/28/18  Yes Rama, Venetia Maxon, MD  lovastatin (MEVACOR) 40 MG tablet Take 1 tablet (40 mg total) by mouth 2 (two) times daily. 11/23/17   Yes Copland, Gay Filler, MD  memantine (NAMENDA) 10 MG tablet TAKE ONE TABLET BY MOUTH TWICE A DAY 10/09/17  Yes Kathrynn Ducking, MD  Multiple Vitamin (MULTIVITAMIN) tablet Take 1 tablet by mouth daily.    Yes [provider]  Probiotic Product (ALIGN PO) Take by mouth.   Yes [provider]  valACYclovir (VALTREX) 500 MG tablet Take 500 mg by mouth daily.   Yes [provider]    ROS:  Out of a complete 14 system review of symptoms, the patient complains only of the following symptoms, and all other reviewed systems are negative.  Light sensitivity Heat intolerance  Blood pressure 104/64, pulse 90, height 5' 5"  (1.651 m), weight 129 lb (58.5 kg), SpO2 97 %.  Physical Exam  General: The patient is alert and cooperative at the time of the examination.  Skin: No significant peripheral edema is noted.   Neurologic Exam  Mental status: The patient is alert and oriented x 3 at the time of the examination. The patient has apparent normal recent and remote memory, with an apparently normal attention span and concentration ability.   Cranial nerves: Facial symmetry is present. Speech is normal, no aphasia or dysarthria is noted. Extraocular movements are full. Visual fields are full.  Pupils are equal, round, and reactive to light.  Discs are flat bilaterally.  Motor: The patient has good strength in all 4 extremities.  Sensory examination: Soft touch sensation is symmetric on the face, arms, and legs.  Coordination: The patient has good finger-nose-finger and heel-to-shin bilaterally.  Gait and station: The patient has a normal gait. Tandem gait is normal. Romberg is negative. No drift is seen.  Reflexes: Deep tendon reflexes are symmetric.   Assessment/Plan:  1.  Multiple sclerosis  2.  B-cell lymphoma  The patient will not go back on a disease modifying agent at this point, she got rituximab in October 2019, this does have significant disease  modifying activity against multiple sclerosis.  The patient will be seen in 6 months, we will discuss potentially going on Copaxone at that point if chemotherapy has ended.  Greater than 50% of the visit was spent in counseling and coordination of care.  Face-to-face time with the patient was 20 minutes.   Jill Alexanders MD 04/15/2018 10:30 AM  [facility] [address] [place], [place] [address]  Phone [phone] Fax [phone]

## 2018-04-20 ENCOUNTER — Encounter: Payer: Self-pay | Admitting: Family Medicine

## 2018-04-20 DIAGNOSIS — Z1231 Encounter for screening mammogram for malignant neoplasm of breast: Secondary | ICD-10-CM | POA: Diagnosis not present

## 2018-04-21 ENCOUNTER — Telehealth: Payer: Self-pay | Admitting: *Deleted

## 2018-04-21 NOTE — Telephone Encounter (Signed)
Received Mammogram results from [place]; forwarded to provider/SLS 01/08

## 2018-05-18 DIAGNOSIS — H43813 Vitreous degeneration, bilateral: Secondary | ICD-10-CM | POA: Diagnosis not present

## 2018-05-18 DIAGNOSIS — G35 Multiple sclerosis: Secondary | ICD-10-CM | POA: Diagnosis not present

## 2018-05-18 DIAGNOSIS — H18423 Band keratopathy, bilateral: Secondary | ICD-10-CM | POA: Diagnosis not present

## 2018-05-18 DIAGNOSIS — H25813 Combined forms of age-related cataract, bilateral: Secondary | ICD-10-CM | POA: Diagnosis not present

## 2018-05-18 DIAGNOSIS — H04123 Dry eye syndrome of bilateral lacrimal glands: Secondary | ICD-10-CM | POA: Diagnosis not present

## 2018-05-19 IMAGING — US US EXTREM UP*L* LTD
1 series · 6 of 6 positions shown · non-contrast
Comparison: none

CLINICAL DATA: Soft tissue mass of the left upper arm.

EXAM:
ULTRASOUND LEFT UPPER EXTREMITY LIMITED
TECHNIQUE: Ultrasound examination of the upper extremity soft tissues was
performed in the area of clinical concern.

[Series 1: us extrem up*left* ltd · 0.03mm/px · 6 of 6 slices shown]
[im 1/6]
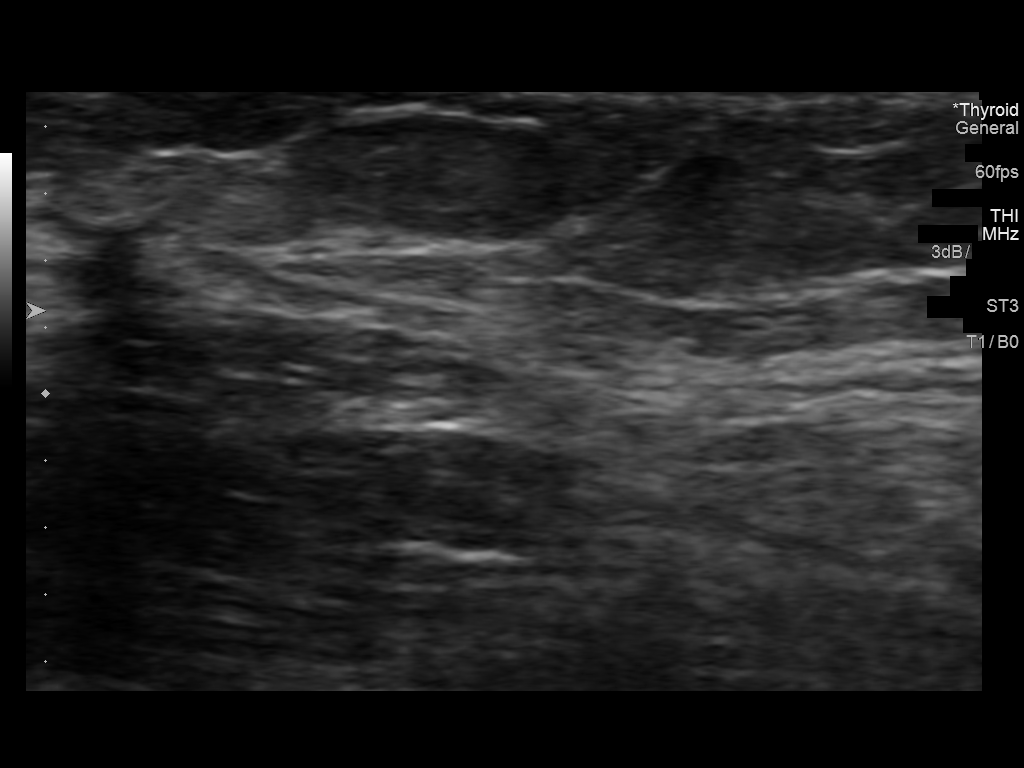
[im 2/6]
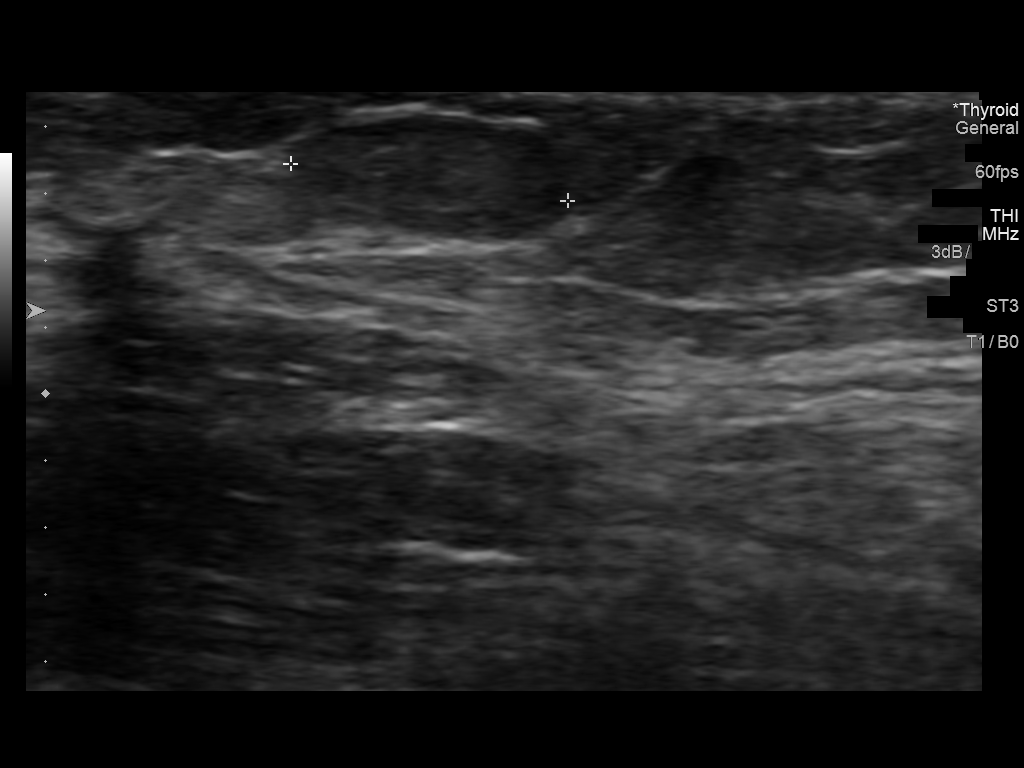
[im 3/6]
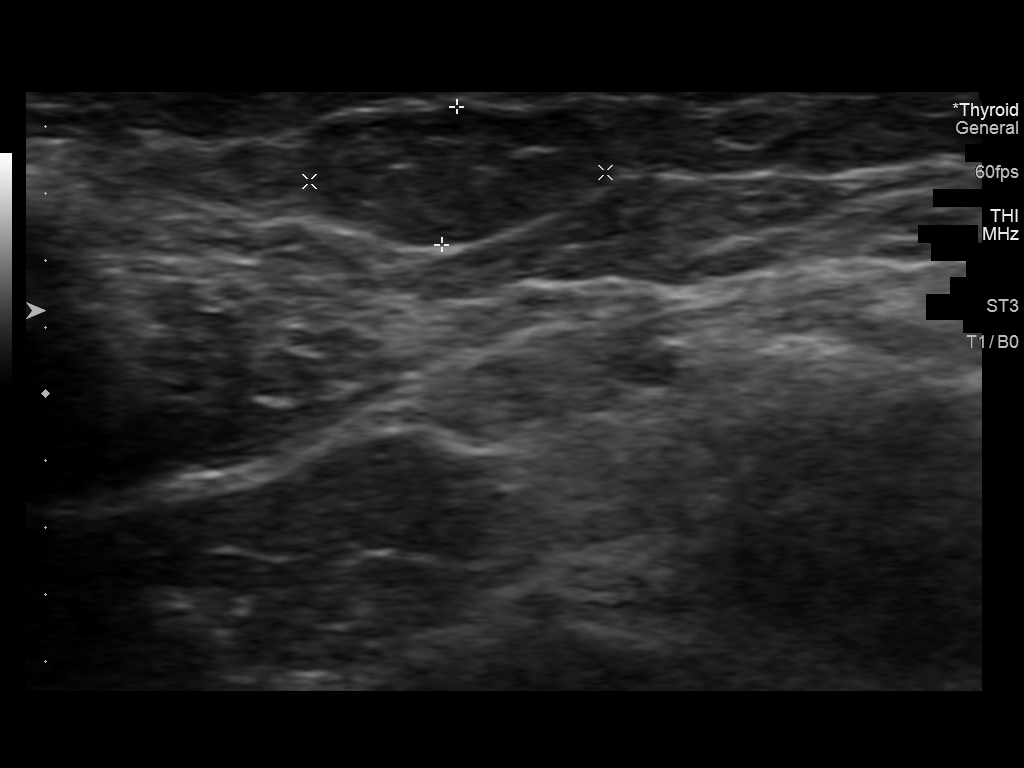
[im 4/6]
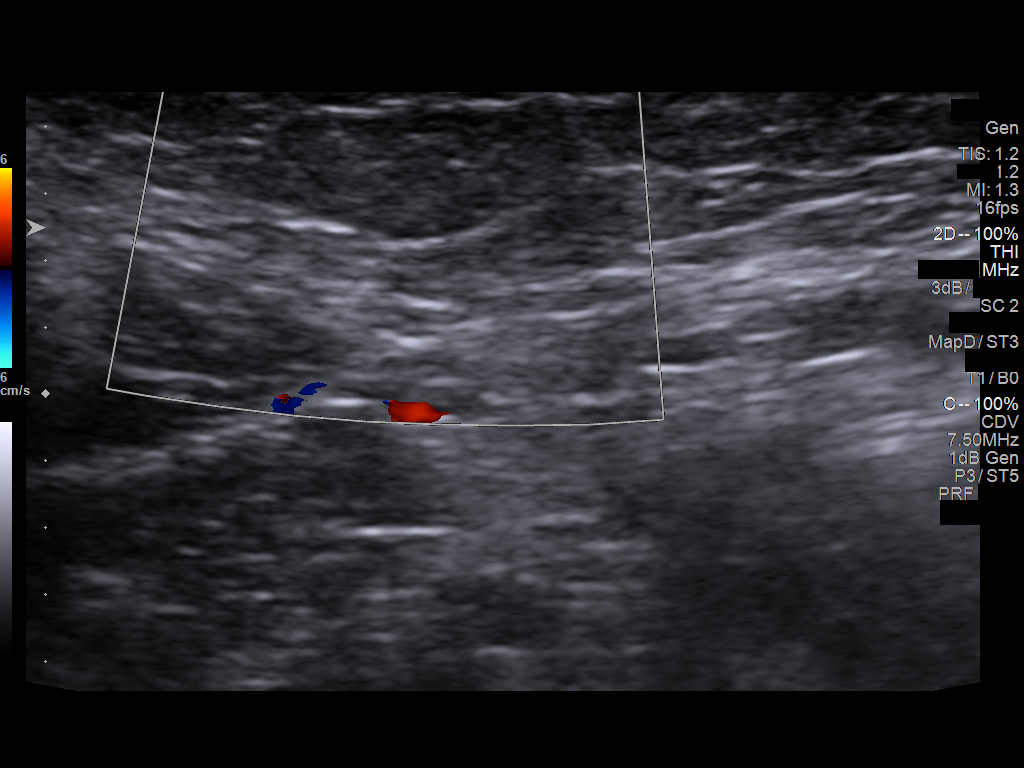
[im 5/6]
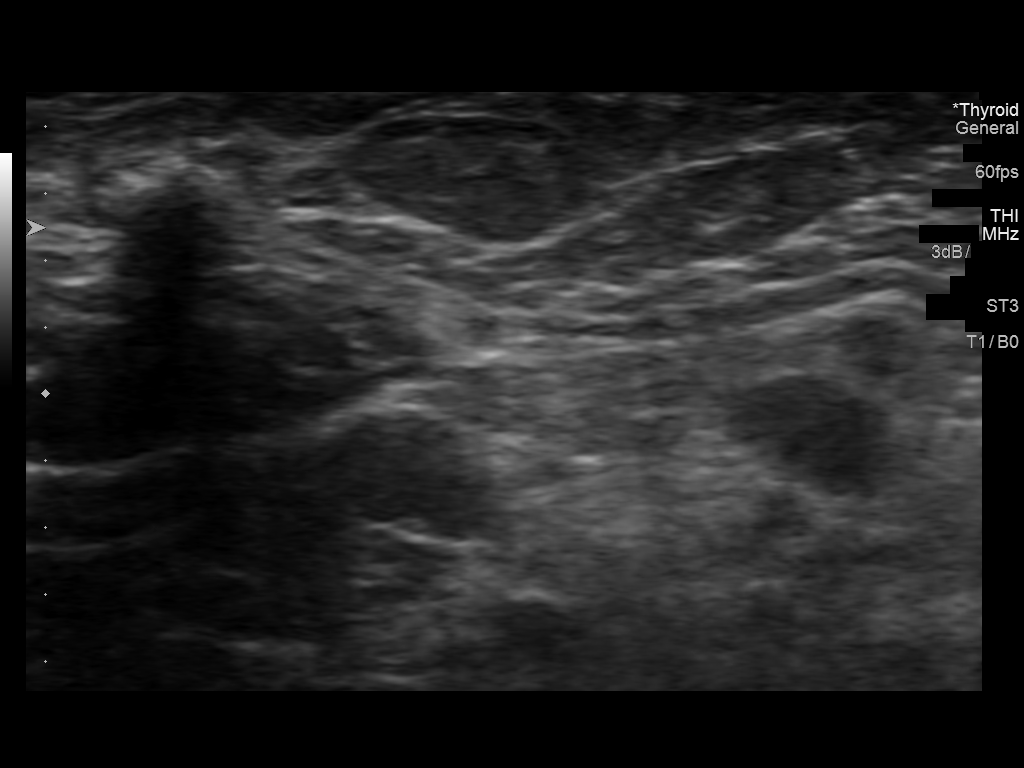
[im 6/6]
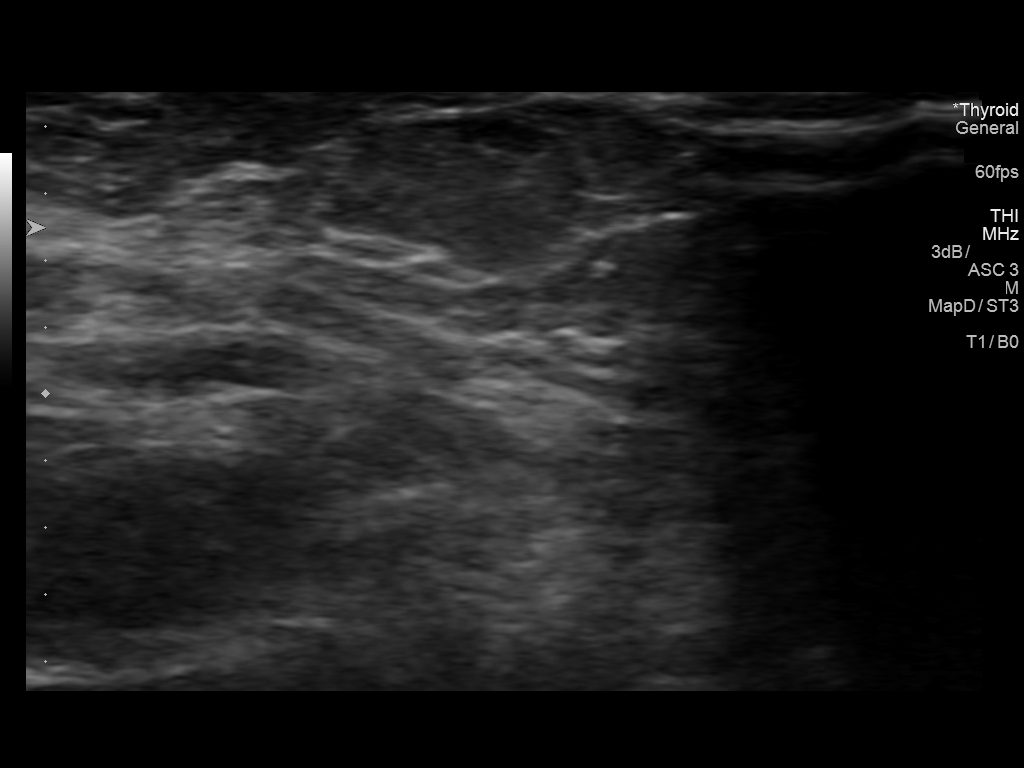

[6 of 6 positions shown; findings below may reference images not displayed]

FINDINGS: There is a 9 x 8 x 4 mm fairly well-defined mass in the subcutaneous
fat of the medial aspect of the left upper arm. There is no abnormal
blood flow. Characteristics are consistent with a benign lipoma.
Echogenicity is essentially the same as adjacent fat.
IMPRESSION: Benign small subcutaneous lipoma in the medial aspect of the left
upper arm.

## 2018-05-31 DIAGNOSIS — H2512 Age-related nuclear cataract, left eye: Secondary | ICD-10-CM | POA: Diagnosis not present

## 2018-06-02 ENCOUNTER — Other Ambulatory Visit: Payer: Self-pay | Admitting: Family Medicine

## 2018-06-02 DIAGNOSIS — E785 Hyperlipidemia, unspecified: Secondary | ICD-10-CM

## 2018-06-28 ENCOUNTER — Telehealth: Payer: Self-pay

## 2018-06-28 MED ORDER — GABAPENTIN 600 MG PO TABS: 600.0000 mg | ORAL_TABLET | Freq: Three times a day (TID) | ORAL | 3 refills | Status: DC

## 2018-06-28 NOTE — Telephone Encounter (Signed)
Patient is calling wanting a refill on her gabapentin (NEURONTIN) 600 MG tablet. She would like it sent to [facility] on [address].

## 2018-06-28 NOTE — Telephone Encounter (Signed)
The prescription for gabapentin was sent in.

## 2018-06-29 DIAGNOSIS — R69 Illness, unspecified: Secondary | ICD-10-CM | POA: Diagnosis not present

## 2018-07-05 DIAGNOSIS — N289 Disorder of kidney and ureter, unspecified: Secondary | ICD-10-CM | POA: Diagnosis not present

## 2018-07-05 DIAGNOSIS — C8339 Diffuse large B-cell lymphoma, extranodal and solid organ sites: Secondary | ICD-10-CM | POA: Diagnosis not present

## 2018-07-05 DIAGNOSIS — M5126 Other intervertebral disc displacement, lumbar region: Secondary | ICD-10-CM | POA: Diagnosis not present

## 2018-07-05 DIAGNOSIS — M21371 Foot drop, right foot: Secondary | ICD-10-CM | POA: Diagnosis not present

## 2018-07-05 DIAGNOSIS — K5900 Constipation, unspecified: Secondary | ICD-10-CM | POA: Diagnosis not present

## 2018-07-05 DIAGNOSIS — M48061 Spinal stenosis, lumbar region without neurogenic claudication: Secondary | ICD-10-CM | POA: Diagnosis not present

## 2018-07-05 DIAGNOSIS — R6881 Early satiety: Secondary | ICD-10-CM | POA: Diagnosis not present

## 2018-07-05 DIAGNOSIS — R5383 Other fatigue: Secondary | ICD-10-CM | POA: Diagnosis not present

## 2018-07-05 DIAGNOSIS — R630 Anorexia: Secondary | ICD-10-CM | POA: Diagnosis not present

## 2018-07-05 DIAGNOSIS — M21372 Foot drop, left foot: Secondary | ICD-10-CM | POA: Diagnosis not present

## 2018-09-15 DIAGNOSIS — L720 Epidermal cyst: Secondary | ICD-10-CM | POA: Diagnosis not present

## 2018-09-15 DIAGNOSIS — L821 Other seborrheic keratosis: Secondary | ICD-10-CM | POA: Diagnosis not present

## 2018-09-15 DIAGNOSIS — B078 Other viral warts: Secondary | ICD-10-CM | POA: Diagnosis not present

## 2018-09-15 DIAGNOSIS — D1801 Hemangioma of skin and subcutaneous tissue: Secondary | ICD-10-CM | POA: Diagnosis not present

## 2018-09-22 DIAGNOSIS — R69 Illness, unspecified: Secondary | ICD-10-CM | POA: Diagnosis not present

## 2018-09-23 DIAGNOSIS — C8339 Diffuse large B-cell lymphoma, extranodal and solid organ sites: Secondary | ICD-10-CM | POA: Diagnosis not present

## 2018-09-28 DIAGNOSIS — R69 Illness, unspecified: Secondary | ICD-10-CM | POA: Diagnosis not present

## 2018-10-14 ENCOUNTER — Encounter: Payer: Self-pay | Admitting: Neurology

## 2018-10-14 ENCOUNTER — Telehealth: Payer: Self-pay

## 2018-10-14 ENCOUNTER — Other Ambulatory Visit: Payer: Self-pay

## 2018-10-14 ENCOUNTER — Ambulatory Visit: Payer: Medicare HMO | Admitting: Neurology

## 2018-10-14 VITALS — BP 132/81 | HR 86 | Temp 97.3°F | Ht 65.5 in | Wt 134.3 lb

## 2018-10-14 DIAGNOSIS — Z5181 Encounter for therapeutic drug level monitoring: Secondary | ICD-10-CM

## 2018-10-14 DIAGNOSIS — G35 Multiple sclerosis: Secondary | ICD-10-CM

## 2018-10-14 NOTE — Telephone Encounter (Signed)
Aubagio start for has been faxed to [facility]. Fax # [phone]. Confirmation has been received.

## 2018-10-14 NOTE — Progress Notes (Signed)
Reason for visit: Multiple sclerosis  [name] is an 78 y.o. female  History of present illness:  [name] is a 78 year old left-handed white female with a history of multiple sclerosis.  The patient was just treated for lymphoma, she received rituximab, and she was taken off Gilenya around this time.  She is now on no disease modifying agents.  She has not required any repeat doses of rituximab.  She has been noted to have an irregular heartbeat at times, she is being followed for this.  She just recently had left cataract surgery.  She has not noted any new numbness or weakness of the face, arms, legs.  She does have some decreased urine stream but no urgency or incontinence, she has Crohn's disease and may have occasional incontinence of the bowels, she has had prior hemorrhoid surgery.  She has had some mild problems with the balance, she has not been doing her yoga regularly recently.  She is walking on a regular basis.  She does have some underlying fatigue and she reports some mild memory issues.  She has not had any visual changes.  She returns to this office for an evaluation.  She has not had any blood work done since December 2019.  Past Medical History:  Diagnosis Date  . Arthritis, degenerative    both knees  . B12 deficiency 07/06/2014  . Cancer ([place])   . Colitis   . Diverticulosis   . Duodenitis   . Fatty liver   . Gastritis   . Helicobacter pylori gastritis   . Hyperlipidemia   . Hypertension    not treated  . IBD (inflammatory bowel disease)   . Lymphoma ([place])   . Memory difficulty 01/05/2013  . Memory loss    mild  . Multiple sclerosis ([place])   . Osteopenia   . Vitamin B12 deficiency     Past Surgical History:  Procedure Laterality Date  . ABDOMINAL HYSTERECTOMY  2010  . BLADDER SURGERY  2010   Bladder Tack  . CERVICAL SPINE SURGERY    . CHOLECYSTECTOMY    . HEMORRHOID SURGERY    . ROTATOR CUFF REPAIR    . SPINAL FUSION    . TUBAL LIGATION       Family History  Problem Relation Age of Onset  . Diabetes Father   . Heart disease Father   . Colon polyps Mother   . Breast cancer Cousin   . Colon cancer Maternal Grandmother        ? may be duodenal cancer  . Colon polyps Brother   . Hypertension Brother     Social history:  reports that she quit smoking about 21 years ago. She has never used smokeless tobacco. She reports that she does not drink alcohol or use drugs.    Allergies  Allergen Reactions  . Sulfa Antibiotics Hives and Nausea And Vomiting  . Vytorin [Ezetimibe-Simvastatin]     White skin patch  . Cephalexin   . Doxycycline     Raw mouth   . Erythromycin     REACTION: weakness  . Macrobid [Nitrofurantoin Macrocrystal] Hives  . Morphine Nausea And Vomiting  . Sulfonamide Derivatives Hives and Nausea And Vomiting  . Amoxicillin Hives    Medications:  Prior to Admission medications   Medication Sig Start Date End Date Taking? Authorizing Provider  Ascorbic Acid (VITAMIN C) 1000 MG tablet Take 1,000 mg by mouth daily.   Yes [provider]  [facility]  Seed Oil 500 MG CAPS Take by mouth.   Yes [provider]  acetaminophen (TYLENOL) 325 MG tablet Take 2 tablets (650 mg total) by mouth every 6 (six) hours as needed for mild pain or fever (or Fever >/= 101). 02/28/18   Rama, Venetia Maxon, MD  b complex vitamins tablet Take 1 tablet by mouth daily.    [provider]  Cholecalciferol (VITAMIN D3) 5000 units TABS Take 5,000 Units by mouth daily.     [provider]  desonide (DESOWEN) 0.05 % lotion Apply topically 2 (two) times daily. Use as needed for itchy ears Patient taking differently: Apply 1 application topically as needed (itch ears). Use as needed for itchy ears 11/23/17   Copland, Gay Filler, MD  fenofibrate (TRICOR) 145 MG tablet Take 1 tablet (145 mg total) by mouth daily. 11/23/17   Copland, Gay Filler, MD  gabapentin (NEURONTIN) 600 MG tablet Take 1 tablet (600 mg  total) by mouth 3 (three) times daily. 06/28/18   Kathrynn Ducking, MD  Glucosamine 500 MG TABS Take 1 tablet by mouth 2 (two) times daily.     [provider]  loratadine (CLARITIN) 10 MG tablet Take 1 tablet (10 mg total) by mouth daily as needed for allergies. 02/28/18   Rama, Venetia Maxon, MD  lovastatin (MEVACOR) 40 MG tablet TAKE 1 TABLET BY MOUTH TWICE A DAY 06/03/18   Copland, Gay Filler, MD  memantine (NAMENDA) 10 MG tablet TAKE ONE TABLET BY MOUTH TWICE A DAY 10/09/17   Kathrynn Ducking, MD  Multiple Vitamin (MULTIVITAMIN) tablet Take 1 tablet by mouth daily.     [provider]  Probiotic Product (ALIGN PO) Take by mouth.    [provider]  valACYclovir (VALTREX) 500 MG tablet Take 500 mg by mouth daily.    [provider]    ROS:  Out of a complete 14 system review of symptoms, the patient complains only of the following symptoms, and all other reviewed systems are negative.  Fatigue Mild memory problems  Blood pressure 132/81, pulse 86, temperature (!) 97.3 F (36.3 C), temperature source Temporal, height 5' 5.5" (1.664 m), weight 134 lb 5 oz (60.9 kg).  Physical Exam  General: The patient is alert and cooperative at the time of the examination.  Skin: No significant peripheral edema is noted.   Neurologic Exam  Mental status: The patient is alert and oriented x 3 at the time of the examination. The Mini-Mental status examination done today shows a total score 25/30.   Cranial nerves: Facial symmetry is present. Speech is normal, no aphasia or dysarthria is noted. Extraocular movements are full. Visual fields are full.  Pupils are equal, round, and reactive to light.  Discs are flat bilaterally.  Motor: The patient has good strength in all 4 extremities.  Sensory examination: Soft touch sensation is symmetric on the face, arms, and legs.  Coordination: The patient has good finger-nose-finger and heel-to-shin bilaterally.  Gait and  station: The patient has a normal gait. Tandem gait is minimally unsteady. Romberg is negative. No drift is seen.  Reflexes: Deep tendon reflexes are symmetric.   Assessment/Plan:  1.  Multiple sclerosis  2.  History of lymphoma  3.  Reported mild memory disturbance  We will follow the memory issues over time.  The patient will have blood work done today, we will consider initiation of treatment with Aubagio.  Once the treatment starts, we will need to check monthly liver enzymes over the  next 6 months.  She will follow-up here in 6 months.  Jill Alexanders MD 10/14/2018 7:48 AM  [facility] [address] [place], [place] [address]  Phone [phone] Fax [phone]

## 2018-10-18 LAB — CBC WITH DIFFERENTIAL/PLATELET
Basophils Absolute: 0 10*3/uL (ref 0.0–0.2)
Basos: 1 %
EOS (ABSOLUTE): 0.1 10*3/uL (ref 0.0–0.4)
Eos: 2 %
Hematocrit: 40.5 % (ref 34.0–46.6)
Hemoglobin: 13 g/dL (ref 11.1–15.9)
Immature Grans (Abs): 0 10*3/uL (ref 0.0–0.1)
Immature Granulocytes: 1 %
Lymphocytes Absolute: 0.6 10*3/uL — ABNORMAL LOW (ref 0.7–3.1)
Lymphs: 16 %
MCH: 31.6 pg (ref 26.6–33.0)
MCHC: 32.1 g/dL (ref 31.5–35.7)
MCV: 98 fL — ABNORMAL HIGH (ref 79–97)
Monocytes Absolute: 0.3 10*3/uL (ref 0.1–0.9)
Monocytes: 9 %
Neutrophils Absolute: 2.7 10*3/uL (ref 1.4–7.0)
Neutrophils: 71 %
Platelets: 239 10*3/uL (ref 150–450)
RBC: 4.12 x10E6/uL (ref 3.77–5.28)
RDW: 14.3 % (ref 11.7–15.4)
WBC: 3.7 10*3/uL (ref 3.4–10.8)

## 2018-10-18 LAB — QUANTIFERON-TB GOLD PLUS
QuantiFERON Mitogen Value: 3.27 IU/mL
QuantiFERON Nil Value: 0.02 IU/mL
QuantiFERON TB1 Ag Value: 0.02 IU/mL
QuantiFERON TB2 Ag Value: 0.02 IU/mL
QuantiFERON-TB Gold Plus: NEGATIVE

## 2018-10-18 LAB — COMPREHENSIVE METABOLIC PANEL
ALT: 13 IU/L (ref 0–32)
AST: 22 IU/L (ref 0–40)
Albumin/Globulin Ratio: 2 (ref 1.2–2.2)
Albumin: 4.4 g/dL (ref 3.7–4.7)
Alkaline Phosphatase: 62 IU/L (ref 39–117)
BUN/Creatinine Ratio: 20 (ref 12–28)
BUN: 19 mg/dL (ref 8–27)
Bilirubin Total: 0.4 mg/dL (ref 0.0–1.2)
CO2: 25 mmol/L (ref 20–29)
Calcium: 9.6 mg/dL (ref 8.7–10.3)
Chloride: 103 mmol/L (ref 96–106)
Creatinine, Ser: 0.97 mg/dL (ref 0.57–1.00)
GFR calc Af Amer: 66 mL/min/{1.73_m2} (ref >59)
GFR calc non Af Amer: 57 mL/min/{1.73_m2} — ABNORMAL LOW (ref >59)
Globulin, Total: 2.2 g/dL (ref 1.5–4.5)
Glucose: 99 mg/dL (ref 65–99)
Potassium: 4 mmol/L (ref 3.5–5.2)
Sodium: 144 mmol/L (ref 134–144)
Total Protein: 6.6 g/dL (ref 6.0–8.5)

## 2018-10-18 LAB — HEPATITIS C ANTIBODY: Hep C Virus Ab: <0.1 s/co ratio (ref 0.0–0.9)

## 2018-10-18 LAB — VARICELLA ZOSTER ANTIBODY, IGG: Varicella zoster IgG: 2934 index (ref >165)

## 2018-10-18 LAB — HEPATITIS B CORE ANTIBODY, TOTAL: Hep B Core Total Ab: NEGATIVE

## 2018-10-20 NOTE — Telephone Encounter (Signed)
PA for Rogelia Rohrer has been submitted via cover my meds. (Key: [id number])  Your information has been sent to [facility] Part D.  Waiting on a response.

## 2018-10-20 NOTE — Telephone Encounter (Signed)
PA for Jerre Simon has been approved. PA approval faxed to [facility]. Fax # [phone]. Confirmation received.

## 2018-11-01 ENCOUNTER — Telehealth: Payer: Self-pay | Admitting: Neurology

## 2018-11-01 NOTE — Telephone Encounter (Signed)
I called the patient.  Aubagio does have potential side effects, the liver needs to be followed closely over the first 6 months.  Its effect on the white blood count and absolute lymphocyte count is minimal, is not as risky as Tecfidera or Gilenya.  We discussed the potential for hair loss and diarrhea.  The patient has decided to go ahead and use the Aubagio, if she does have side effects we can always go to Copaxone in the future.

## 2018-11-01 NOTE — Telephone Encounter (Signed)
Pt called and stated that she does not want to try the Aubagio due to the side effects and what the Specialty Pharmacy told her but she would like to try the Copaxone. Please advise.

## 2018-11-09 ENCOUNTER — Other Ambulatory Visit: Payer: Self-pay | Admitting: Neurology

## 2018-11-24 ENCOUNTER — Other Ambulatory Visit: Payer: Self-pay | Admitting: Family Medicine

## 2018-11-24 DIAGNOSIS — E785 Hyperlipidemia, unspecified: Secondary | ICD-10-CM

## 2018-11-29 ENCOUNTER — Ambulatory Visit: Payer: Medicare HMO | Admitting: Family Medicine

## 2018-11-29 DIAGNOSIS — C8339 Diffuse large B-cell lymphoma, extranodal and solid organ sites: Secondary | ICD-10-CM | POA: Diagnosis not present

## 2018-11-29 LAB — CBC AND DIFFERENTIAL
HCT: 41 (ref 36–46)
Hemoglobin: 13.3 (ref 12.0–16.0)
Platelets: 136 — AB (ref 150–399)
WBC: 3.1

## 2018-11-29 LAB — BASIC METABOLIC PANEL
BUN: 24 — AB (ref 4–21)
CO2: 26 — AB (ref 13–22)
Chloride: 102 (ref 99–108)
Creatinine: 1 (ref 0.5–1.1)
Glucose: 158
Potassium: 3.4 (ref 3.4–5.3)
Sodium: 139 (ref 137–147)

## 2018-11-29 LAB — COMPREHENSIVE METABOLIC PANEL
Calcium: 9.1 (ref 8.7–10.7)
GFR calc non Af Amer: 54

## 2018-11-29 LAB — HEPATIC FUNCTION PANEL
ALT: 19 (ref 7–35)
AST: 31 (ref 13–35)

## 2018-12-06 DIAGNOSIS — R918 Other nonspecific abnormal finding of lung field: Secondary | ICD-10-CM | POA: Diagnosis not present

## 2018-12-06 DIAGNOSIS — C8339 Diffuse large B-cell lymphoma, extranodal and solid organ sites: Secondary | ICD-10-CM | POA: Diagnosis not present

## 2018-12-06 DIAGNOSIS — C8330 Diffuse large B-cell lymphoma, unspecified site: Secondary | ICD-10-CM | POA: Diagnosis not present

## 2018-12-06 DIAGNOSIS — E041 Nontoxic single thyroid nodule: Secondary | ICD-10-CM | POA: Diagnosis not present

## 2018-12-06 DIAGNOSIS — Z923 Personal history of irradiation: Secondary | ICD-10-CM | POA: Diagnosis not present

## 2018-12-06 LAB — BASIC METABOLIC PANEL: Glucose: 83

## 2018-12-09 DIAGNOSIS — C8339 Diffuse large B-cell lymphoma, extranodal and solid organ sites: Secondary | ICD-10-CM | POA: Diagnosis not present

## 2018-12-10 ENCOUNTER — Other Ambulatory Visit: Payer: Self-pay

## 2018-12-11 NOTE — Progress Notes (Signed)
[place] [facility] [address] [place], [place] [address] [phone]  Date:  12/13/2018   Name:  [name]   DOB:  1941/03/16   MRN:  [id number]  PCP:  Darreld Mclean, MD    Chief Complaint: Annual Exam   History of Present Illness:  [name] is a 78 y.o. very pleasant female patient who presents with the following:  Here today for an annual physical exam Last seen by myself in November-at that point she had been recently admitted with diverticulitis and sepsis.  She has history of non-Hodgkin's lymphoma originally in the left kidney, Crohn's disease, MS, hyperlipidemia, memory loss Her oncology is to [facility], Dr. Mina Marble Her radiation oncologist is Mason Jim Her neurologist is Dr. Rich Reining recent visit with him in July.  Right now they are following her MS, no medications for same She is married to [place], who is also my patient  Tetanus appears to be due Flu shot: will give today  Mammo-up-to-date Colonoscopy up-to-date Offer DEXA scan; will arrange for her, will set up at med center She is fasting this am  Dr. Jannifer Franklin got a C met and CBC in July, her critical leukopenia had improved.  Consider cholesterol panel and A1c today  Fenofibrate Gabapentin 600 3 times daily Lovastatin 40 Namenda Teriflunomide for MS  Her mother had a short illness and died about a month ago, this has been stressful She lived in [place], [place] with her when she passed   She notes that her energy level is still not great  She had been a pt of [facility] Edis but stopped seeing GI when she reitred  [facility] Readings from Last 3 Encounters:  12/13/18 127 lb (57.6 kg)  10/14/18 134 lb 5 oz (60.9 kg)  04/15/18 129 lb (58.5 kg)   Her lowest weight was down to about 110 before she did chemo She has gained back some of her original weight  She walks about 1/2 mile 3x a day- no CP or SOB with this activity She was noted  to have some coronary calcifications on a recent PET scan from [place] She brings in scans and labs from [place] for my review today;   PET scan 12/24/2017, chest mention severe coronary calcifications, abdominal aorta displays scattered atherosclerotic calcifications PET scan of the neck also mentions thyroid nodule-thyroid ultrasound is recommended 6 mm right upper lobe lung nodule is new, recommend follow-up on this as well  Pranavi wonders if I can help her with the thyroid ultrasound and vascular calcification concerns Patient Active Problem List   Diagnosis Date Noted  . Hypokalemia 02/28/2018  . Sepsis ([place]) 02/27/2018  . Diverticulitis 02/27/2018  . Diffuse large B-cell lymphoma ([place]) 02/27/2018  . Osteoarthritis of cervical spine 09/15/2016  . Abnormality of gait 11/07/2015  . Dizziness and giddiness 11/07/2015  . Mild cognitive impairment 01/09/2015  . B12 deficiency 07/06/2014  . Leukopenia 05/06/2013  . Memory difficulty 01/05/2013  . Dyslipidemia 11/28/2008  . Multiple sclerosis ([place]) 11/28/2008  . INFLAMMATORY BOWEL DISEASE 11/28/2008  . DIVERTICULOSIS, COLON 11/28/2008  . FATTY LIVER DISEASE 11/28/2008  . PERSONAL HISTORY OF PEPTIC ULCER DISEASE 11/28/2008    Past Medical History:  Diagnosis Date  . Arthritis, degenerative    both knees  . B12 deficiency 07/06/2014  . Cancer ([place])   . Colitis   . Diverticulosis   . Duodenitis   . Fatty liver   . Gastritis   .  Helicobacter pylori gastritis   . Hyperlipidemia   . Hypertension    not treated  . IBD (inflammatory bowel disease)   . Lymphoma ([place])   . Memory difficulty 01/05/2013  . Memory loss    mild  . Multiple sclerosis ([place])   . Osteopenia   . Vitamin B12 deficiency     Past Surgical History:  Procedure Laterality Date  . ABDOMINAL HYSTERECTOMY  2010  . BLADDER SURGERY  2010   Bladder Tack  . CERVICAL SPINE SURGERY    . CHOLECYSTECTOMY    . HEMORRHOID SURGERY    . ROTATOR CUFF REPAIR    . SPINAL  FUSION    . TUBAL LIGATION      Social History   Tobacco Use  . Smoking status: Former Smoker    Quit date: 06/27/1997    Years since quitting: 21.4  . Smokeless tobacco: Never Used  Substance Use Topics  . Alcohol use: No  . Drug use: No    Family History  Problem Relation Age of Onset  . Diabetes Father   . Heart disease Father   . Colon polyps Mother   . Breast cancer Cousin   . Colon cancer Maternal Grandmother        ? may be duodenal cancer  . Colon polyps Brother   . Hypertension Brother     Allergies  Allergen Reactions  . Sulfa Antibiotics Hives and Nausea And Vomiting  . Vytorin [Ezetimibe-Simvastatin]     White skin patch  . Cephalexin   . Doxycycline     Raw mouth   . Erythromycin     REACTION: weakness  . Macrobid [Nitrofurantoin Macrocrystal] Hives  . Morphine Nausea And Vomiting  . Sulfonamide Derivatives Hives and Nausea And Vomiting  . Amoxicillin Hives    Medication list has been reviewed and updated.  Current Outpatient Medications on File Prior to Visit  Medication Sig Dispense Refill  . acetaminophen (TYLENOL) 325 MG tablet Take 2 tablets (650 mg total) by mouth every 6 (six) hours as needed for mild pain or fever (or Fever >/= 101).    . Ascorbic Acid (VITAMIN C) 1000 MG tablet Take 1,000 mg by mouth daily.    [name] Kitchen b complex vitamins tablet Take 1 tablet by mouth daily.    . Black Currant Seed Oil 500 MG CAPS Take by mouth.    . Cholecalciferol (VITAMIN D3) 5000 units TABS Take 5,000 Units by mouth daily.     [name] Kitchen desonide (DESOWEN) 0.05 % lotion Apply topically 2 (two) times daily. Use as needed for itchy ears (Patient taking differently: Apply 1 application topically as needed (itch ears). Use as needed for itchy ears) 59 mL 1  . fenofibrate (TRICOR) 145 MG tablet Take 1 tablet (145 mg total) by mouth daily. 90 tablet 3  . gabapentin (NEURONTIN) 600 MG tablet Take 1 tablet (600 mg total) by mouth 3 (three) times daily. 270 tablet 3  .  Glucosamine 500 MG TABS Take 1 tablet by mouth 2 (two) times daily.     [name] Kitchen loratadine (CLARITIN) 10 MG tablet Take 1 tablet (10 mg total) by mouth daily as needed for allergies.    [name] Kitchen LORazepam (ATIVAN) 1 MG tablet Take by mouth.    . lovastatin (MEVACOR) 40 MG tablet TAKE 1 TABLET BY MOUTH TWICE A DAY 180 tablet 0  . memantine (NAMENDA) 10 MG tablet TAKE 1 TABLET BY MOUTH TWICE A DAY 180 tablet 2  . Multiple Vitamin (MULTIVITAMIN) tablet  Take 1 tablet by mouth daily.     . Probiotic Product (ALIGN PO) Take by mouth.    . Teriflunomide 14 MG TABS      No current facility-administered medications on file prior to visit.     Review of Systems:  As per HPI- otherwise negative.   Physical Examination: Vitals:   12/13/18 0819  BP: 128/80  Pulse: 88  Resp: 16  Temp: (!) 96 F (35.6 C)  SpO2: 99%   Vitals:   12/13/18 0819  Weight: 127 lb (57.6 kg)  Height: 5' 5.5" (1.664 m)   Body mass index is 20.81 kg/m. Ideal Body Weight: Weight in (lb) to have BMI = 25: 152.2  GEN: WDWN, NAD, Non-toxic, A & O x 3, normal weight, looks well  HEENT: Atraumatic, Normocephalic. Neck supple. No masses, No LAD.  Tm wnl  Ears and Nose: No external deformity. CV: RRR, No M/G/R. No JVD. No thrill. No extra heart sounds. PULM: CTA B, no wheezes, crackles, rhonchi. No retractions. No resp. distress. No accessory muscle use. ABD: S, NT, ND, +BS. No rebound. No HSM. EXTR: No c/c/e NEURO Normal gait.  PSYCH: Normally interactive. Conversant. Not depressed or anxious appearing.  Calm demeanor.    Assessment and Plan: Physical exam  Leukopenia, unspecified type  Encounter for medical examination to establish care  Multiple sclerosis ([place])  Dyslipidemia - Plan: Lipid panel  Low energy - Plan: TSH  Right thyroid nodule - Plan: US THYROID  Crohn's disease without complication, unspecified gastrointestinal tract location [facility]) - Plan: Ambulatory referral to Gastroenterology  Wound of left lower  extremity, initial encounter - Plan: Td vaccine greater than or equal to 7yo preservative free IM  Coronary artery calcification - Plan: Ambulatory referral to Cardiology  Estrogen deficiency - Plan: DG Bone Density  Screening for diabetes mellitus - Plan: Hemoglobin A1c  Needs flu shot - Plan: Flu Vaccine QUAD High Dose(Fluad)  Here today for physical exam and follow-up She has Crohn's disease without current gastroenterologist.  Referral to the by her GI to reestablish Dr. Maurene Capes has retired Referral to cardiology to discuss her coronary calcifications.  I am not sure if a stress test may be warranted. Ordered bone density Ordered thyroid ultrasound Flu shot given Labs pending as above  Signed Lamar Blinks, MD  Received her labs  Message to pt  Received her labs 9/1, message to patient Her triglycerides are quite a bit since last year,?  Is she been taking her TriCor  Results for orders placed or performed in visit on 12/13/18  Hemoglobin A1c  Result Value Ref Range   Hgb A1c MFr Bld 6.0 4.6 - 6.5 %  Lipid panel  Result Value Ref Range   Cholesterol 114 0 - 200 mg/dL   Triglycerides 368.0 (H) 0.0 - 149.0 mg/dL   HDL 36.20 (L) >39.00 mg/dL   VLDL 73.6 (H) 0.0 - 40.0 mg/dL   Total CHOL/HDL Ratio 3    NonHDL 77.68   TSH  Result Value Ref Range   TSH 1.57 0.35 - 4.50 uIU/mL  LDL cholesterol, direct  Result Value Ref Range   Direct LDL 37.0 mg/dL

## 2018-12-11 NOTE — Patient Instructions (Addendum)
It was great to see you again today, I will be in touch with your labs as soon as possible We will refer you to gastroenterology and also cardiology here at the [facility] for evaluation  We will also set up a bone density scan and thyroid ultrasound You got your tetanus booster and seasonal flu shot today  Please plan to see me in about 6 months and take care!   You might also consider getting the shingles vaccine (shingrix) at your drug store at your convenience    Health Maintenance After Age 41 After age 41, you are at a higher risk for certain long-term diseases and infections as well as injuries from falls. Falls are a major cause of broken bones and head injuries in people who are older than age 77. Getting regular preventive care can help to keep you healthy and well. Preventive care includes getting regular testing and making lifestyle changes as recommended by your health care provider. Talk with your health care provider about:  Which screenings and tests you should have. A screening is a test that checks for a disease when you have no symptoms.  A diet and exercise plan that is right for you. What should I know about screenings and tests to prevent falls? Screening and testing are the best ways to find a health problem early. Early diagnosis and treatment give you the best chance of managing medical conditions that are common after age 52. Certain conditions and lifestyle choices may make you more likely to have a fall. Your health care provider may recommend:  Regular vision checks. Poor vision and conditions such as cataracts can make you more likely to have a fall. If you wear glasses, make sure to get your prescription updated if your vision changes.  Medicine review. Work with your health care provider to regularly review all of the medicines you are taking, including over-the-counter medicines. Ask your health care provider about any side effects that may make you more likely to  have a fall. Tell your health care provider if any medicines that you take make you feel dizzy or sleepy.  Osteoporosis screening. Osteoporosis is a condition that causes the bones to get weaker. This can make the bones weak and cause them to break more easily.  Blood pressure screening. Blood pressure changes and medicines to control blood pressure can make you feel dizzy.  Strength and balance checks. Your health care provider may recommend certain tests to check your strength and balance while standing, walking, or changing positions.  Foot health exam. Foot pain and numbness, as well as not wearing proper footwear, can make you more likely to have a fall.  Depression screening. You may be more likely to have a fall if you have a fear of falling, feel emotionally low, or feel unable to do activities that you used to do.  Alcohol use screening. Using too much alcohol can affect your balance and may make you more likely to have a fall. What actions can I take to lower my risk of falls? General instructions  Talk with your health care provider about your risks for falling. Tell your health care provider if: ? You fall. Be sure to tell your health care provider about all falls, even ones that seem minor. ? You feel dizzy, sleepy, or off-balance.  Take over-the-counter and prescription medicines only as told by your health care provider. These include any supplements.  Eat a healthy diet and maintain a healthy weight. A healthy  diet includes low-fat dairy products, low-fat (lean) meats, and fiber from whole grains, beans, and lots of fruits and vegetables. Home safety  Remove any tripping hazards, such as rugs, cords, and clutter.  Install safety equipment such as grab bars in bathrooms and safety rails on stairs.  Keep rooms and walkways well-lit. Activity   Follow a regular exercise program to stay fit. This will help you maintain your balance. Ask your health care provider what  types of exercise are appropriate for you.  If you need a cane or walker, use it as recommended by your health care provider.  Wear supportive shoes that have nonskid soles. Lifestyle  Do not drink alcohol if your health care provider tells you not to drink.  If you drink alcohol, limit how much you have: ? 0-1 drink a day for women. ? 0-2 drinks a day for men.  Be aware of how much alcohol is in your drink. In the [place] one drink equals one typical bottle of beer (12 oz), one-half glass of wine (5 oz), or one shot of hard liquor (1 oz).  Do not use any products that contain nicotine or tobacco, such as cigarettes and e-cigarettes. If you need help quitting, ask your health care provider. Summary  Having a healthy lifestyle and getting preventive care can help to protect your health and wellness after age 36.  Screening and testing are the best way to find a health problem early and help you avoid having a fall. Early diagnosis and treatment give you the best chance for managing medical conditions that are more common for people who are older than age 46.  Falls are a major cause of broken bones and head injuries in people who are older than age 47. Take precautions to prevent a fall at home.  Work with your health care provider to learn what changes you can make to improve your health and wellness and to prevent falls. This information is not intended to replace advice given to you by your health care provider. Make sure you discuss any questions you have with your health care provider. Document Released: 02/11/2017 Document Revised: 07/22/2018 Document Reviewed: 02/11/2017 Elsevier Patient Education  2020 [employer].

## 2018-12-13 ENCOUNTER — Encounter: Payer: Self-pay | Admitting: Family Medicine

## 2018-12-13 ENCOUNTER — Other Ambulatory Visit: Payer: Self-pay

## 2018-12-13 ENCOUNTER — Ambulatory Visit (INDEPENDENT_AMBULATORY_CARE_PROVIDER_SITE_OTHER): Payer: Medicare HMO | Admitting: Family Medicine

## 2018-12-13 VITALS — BP 128/80 | HR 88 | Temp 96.0°F | Resp 16 | Ht 65.5 in | Wt 127.0 lb

## 2018-12-13 DIAGNOSIS — Z0000 Encounter for general adult medical examination without abnormal findings: Secondary | ICD-10-CM

## 2018-12-13 DIAGNOSIS — K5090 Crohn's disease, unspecified, without complications: Secondary | ICD-10-CM | POA: Diagnosis not present

## 2018-12-13 DIAGNOSIS — D72819 Decreased white blood cell count, unspecified: Secondary | ICD-10-CM

## 2018-12-13 DIAGNOSIS — Z131 Encounter for screening for diabetes mellitus: Secondary | ICD-10-CM

## 2018-12-13 DIAGNOSIS — E041 Nontoxic single thyroid nodule: Secondary | ICD-10-CM

## 2018-12-13 DIAGNOSIS — E2839 Other primary ovarian failure: Secondary | ICD-10-CM

## 2018-12-13 DIAGNOSIS — E785 Hyperlipidemia, unspecified: Secondary | ICD-10-CM

## 2018-12-13 DIAGNOSIS — R5383 Other fatigue: Secondary | ICD-10-CM

## 2018-12-13 DIAGNOSIS — I2510 Atherosclerotic heart disease of native coronary artery without angina pectoris: Secondary | ICD-10-CM | POA: Diagnosis not present

## 2018-12-13 DIAGNOSIS — Z23 Encounter for immunization: Secondary | ICD-10-CM

## 2018-12-13 DIAGNOSIS — S81802A Unspecified open wound, left lower leg, initial encounter: Secondary | ICD-10-CM | POA: Diagnosis not present

## 2018-12-13 DIAGNOSIS — G35 Multiple sclerosis: Secondary | ICD-10-CM | POA: Diagnosis not present

## 2018-12-13 DIAGNOSIS — I2584 Coronary atherosclerosis due to calcified coronary lesion: Secondary | ICD-10-CM

## 2018-12-13 LAB — LIPID PANEL
Cholesterol: 114 mg/dL (ref 0–200)
HDL: 36.2 mg/dL — ABNORMAL LOW (ref >39.00)
NonHDL: 77.68
Total CHOL/HDL Ratio: 3
Triglycerides: 368 mg/dL — ABNORMAL HIGH (ref 0.0–149.0)
VLDL: 73.6 mg/dL — ABNORMAL HIGH (ref 0.0–40.0)

## 2018-12-13 LAB — HEMOGLOBIN A1C: Hgb A1c MFr Bld: 6 % (ref 4.6–6.5)

## 2018-12-13 LAB — TSH: TSH: 1.57 u[IU]/mL (ref 0.35–4.50)

## 2018-12-13 LAB — LDL CHOLESTEROL, DIRECT: Direct LDL: 37 mg/dL

## 2018-12-14 ENCOUNTER — Encounter: Payer: Self-pay | Admitting: Family Medicine

## 2018-12-15 NOTE — Progress Notes (Signed)
Cardiology Office Note:    Date:  12/16/2018   ID:  [name], DOB May 22, 1940, MRN [id number]  PCP:  Darreld Mclean, MD  Cardiologist:  Shirlee More, MD   Referring MD: Darreld Mclean, MD  ASSESSMENT:    1. Coronary artery calcification seen on CT scan   2. Mixed hyperlipidemia   3. Dyslipidemia   4. Diffuse large B-cell lymphoma, unspecified body region ([place])   5. Carotid atherosclerosis, bilateral    PLAN:    In order of problems listed above:  1. Further evaluation myocardial perfusion study if abnormal high risk markers would benefit from coronary angiography and revascularization.  Not having angina I would not add further antianginal medications continue current lipid-lowering and avoid aspirin with Crohn's disease 2. Continue fenofibrate and statin her lipids are ideal 3. Lymphoma is stable currently in remission 4. Mild carotid atherosclerosis without bruit symptoms or stenosis of note primary treatment here is lipid-lowering she will need follow-up duplex in approximately a year  Next appointment 6 to 8 weeks   Medication Adjustments/Labs and Tests Ordered: Current medicines are reviewed at length with the patient today.  Concerns regarding medicines are outlined above.  No orders of the defined types were placed in this encounter.  No orders of the defined types were placed in this encounter.   Chief complaint is abnormal CT scan with vascular calcification of coronary arteries  She will come back in 6 to 8 weeks to review her testing  History of Present Illness:    [name] is a 78 y.o. female with non-Hodgkin's lymphoma treated with RCHOP Methotrexate and XRT, Crohn's disease, MS, hyperlipidemia, memory loss who is being seen today for the evaluation of coronary artery calcification on CT scan at the request of Copland, Gay Filler, MD.  Carotid duplex 08/10/2017: Final Interpretation: Right Carotid: Velocities in the right ICA are  consistent with a 1-39% stenosis.  Left Carotid: Velocities in the left ICA are consistent with a 1-39% stenosis.  Vertebrals:  Bilateral vertebral arteries demonstrate antegrade flow. Subclavians: Normal flow hemodynamics were seen in bilateral subclavian              arteries.  PET at [facility] 12/06/2018 with severe diffuse coronary artery calcification  She has no history of preceding heart disease arrhythmia heart failure coronary valvular or congenital.  She has not had chest pain shortness of breath edema palpitation or syncope and was told that she has extra beats on previous EKGs.  She has a good quality of life her lymphoma is in remission and she is taking a statin along with fenofibrate for dyslipidemia and has known carotid atherosclerosis.  She was unaware that she had coronary calcification on imaging prior to her recent visit with her PCP.  She is in a high risk group with her lymphoma and survival as well as heavy coronary artery calcification which can be an indication of severe underlying CAD.  We discussed this the options for evaluation and she will undergo myocardial perfusion study and if abnormal and high risk markers would benefit from coronary angiography and revascularization.  Her blood pressure is optimal she is hesitant to take aspirin with a history of Crohn's disease and should continue on lipid-lowering treatment.  She also have an echocardiogram regarding cardiomyopathy and abnormal EKG with a QS pattern in V2 consider anterior septal MI.  She is not having palpitation or syncope has a single PVC on her EKG and at this time I would  not pursue an arrhythmia evaluation.  Past Medical History:  Diagnosis Date  . Arthritis, degenerative    both knees  . B12 deficiency 07/06/2014  . Cancer ([place])   . Colitis   . Diverticulosis   . Duodenitis   . Fatty liver   . Gastritis   . Helicobacter pylori gastritis   . Hyperlipidemia   . Hypertension    not treated  . IBD  (inflammatory bowel disease)   . Lymphoma ([place])   . Memory difficulty 01/05/2013  . Memory loss    mild  . Multiple sclerosis ([place])   . Osteopenia   . Vitamin B12 deficiency     Past Surgical History:  Procedure Laterality Date  . ABDOMINAL HYSTERECTOMY  2010  . BLADDER SURGERY  2010   Bladder Tack  . CERVICAL SPINE SURGERY    . CHOLECYSTECTOMY    . HEMORRHOID SURGERY    . ROTATOR CUFF REPAIR    . SPINAL FUSION    . TUBAL LIGATION      Current Medications: Current Meds  Medication Sig  . Ascorbic Acid (VITAMIN C) 1000 MG tablet Take 1,000 mg by mouth daily.  [name] Kitchen b complex vitamins tablet Take 1 tablet by mouth daily.  . Black Currant Seed Oil 500 MG CAPS Take 1 capsule by mouth daily.   . Cholecalciferol (VITAMIN D3) 5000 units TABS Take 5,000 Units by mouth daily.   [name] Kitchen desonide (DESOWEN) 0.05 % lotion Apply topically 2 (two) times daily. Use as needed for itchy ears (Patient taking differently: Apply 1 application topically as needed (itch ears). Use as needed for itchy ears)  . fenofibrate (TRICOR) 145 MG tablet Take 1 tablet (145 mg total) by mouth daily.  [name] Kitchen gabapentin (NEURONTIN) 600 MG tablet Take 1 tablet (600 mg total) by mouth 3 (three) times daily.  . Glucosamine 500 MG TABS Take 1 tablet by mouth 2 (two) times daily.   [name] Kitchen loratadine (CLARITIN) 10 MG tablet Take 1 tablet (10 mg total) by mouth daily as needed for allergies.  [name] Kitchen lovastatin (MEVACOR) 40 MG tablet TAKE 1 TABLET BY MOUTH TWICE A DAY  . memantine (NAMENDA) 10 MG tablet TAKE 1 TABLET BY MOUTH TWICE A DAY  . Multiple Vitamin (MULTIVITAMIN) tablet Take 1 tablet by mouth daily.   . Probiotic Product (ALIGN PO) Take 1 tablet by mouth daily.   . Teriflunomide 14 MG TABS Take 1 tablet by mouth daily.   . [DISCONTINUED] LORazepam (ATIVAN) 1 MG tablet Take by mouth.     Allergies:   Sulfa antibiotics, Vytorin [ezetimibe-simvastatin], Cephalexin, Doxycycline, Erythromycin, Macrobid [nitrofurantoin macrocrystal],  Morphine, Sulfonamide derivatives, and Amoxicillin   Social History   Socioeconomic History  . Marital status: Married    Spouse name: Eduard Clos   . Number of children: 4  . Years of education: 12+  . Highest education level: Not on file  Occupational History  . Occupation: Retired     Fish farm manager: [employer]  . Financial resource strain: Not on file  . Food insecurity    Worry: Not on file    Inability: Not on file  . Transportation needs    Medical: Not on file    Non-medical: Not on file  Tobacco Use  . Smoking status: Former Smoker    Types: Cigarettes    Quit date: 06/27/1997    Years since quitting: 21.4  . Smokeless tobacco: Never Used  Substance and Sexual Activity  . Alcohol use:  No  . Drug use: No  . Sexual activity: Not Currently  Lifestyle  . Physical activity    Days per week: Not on file    Minutes per session: Not on file  . Stress: Not on file  Relationships  . Social Herbalist on phone: Not on file    Gets together: Not on file    Attends religious service: Not on file    Active member of club or organization: Not on file    Attends meetings of clubs or organizations: Not on file    Relationship status: Not on file  Other Topics Concern  . Not on file  Social History Narrative   Caffeine daily.    Patient lives at home with husband Eduard Clos but goes by [employer].    Patient has 4 children.    Patient has 1 year of college.    Patient is retired.    Patient is left handed.    Patient drinks 2 cups of coffee per day.     Family History: The patient's family history includes Breast cancer in her cousin; Colon cancer in her maternal grandmother; Colon polyps in her brother and mother; Diabetes in her father; Heart disease in her father; Hypertension in her brother.  ROS:   Review of Systems  Constitution: Negative.  HENT: Negative.   Eyes: Negative.   Cardiovascular: Negative.   Respiratory:  Negative.   Endocrine: Negative.   Hematologic/Lymphatic: Negative.   Skin: Negative.   Musculoskeletal: Negative.   Gastrointestinal: Positive for diarrhea.  Genitourinary: Negative.   Neurological: Negative.   Psychiatric/Behavioral: Negative.   Allergic/Immunologic: Negative.    Please see the history of present illness.     All other systems reviewed and are negative.  EKGs/Labs/Other Studies Reviewed:    The following studies were reviewed today:   EKG:  EKG is  ordered today.  The ekg ordered today is personally reviewed and demonstrates sinus rhythm single PVC first-degree heart block left atrial abnormality QS in V2 consider previous inserter septal MI  EKG 03/02/18 with sinus tachycardia LAD  Recent Labs: 02/28/2018: Magnesium 2.0 10/14/2018: ALT 13; BUN 19; Creatinine, Ser 0.97; Hemoglobin 13.0; Platelets 239; Potassium 4.0; Sodium 144 12/13/2018: TSH 1.57  Recent Lipid Panel    Component Value Date/Time   CHOL 114 12/13/2018 0857   TRIG 368.0 (H) 12/13/2018 0857   HDL 36.20 (L) 12/13/2018 0857   CHOLHDL 3 12/13/2018 0857   VLDL 73.6 (H) 12/13/2018 0857   LDLCALC 24 11/23/2017 0947   LDLDIRECT 37.0 12/13/2018 0857    Physical Exam:    VS:  BP 120/78 (BP Location: Left Arm, Patient Position: Sitting, Cuff Size: Normal)   Pulse 93   Ht 5' 5.5" (1.664 m)   Wt 128 lb (58.1 kg)   SpO2 99%   BMI 20.98 kg/m     Wt Readings from Last 3 Encounters:  12/16/18 128 lb (58.1 kg)  12/13/18 127 lb (57.6 kg)  10/14/18 134 lb 5 oz (60.9 kg)     GEN:  Well nourished, well developed in no acute distress HEENT: Normal NECK: No JVD; No carotid bruits LYMPHATICS: No lymphadenopathy CARDIAC: RRR, no murmurs, rubs, gallops RESPIRATORY:  Clear to auscultation without rales, wheezing or rhonchi  ABDOMEN: Soft, non-tender, non-distended MUSCULOSKELETAL:  No edema; No deformity  SKIN: Warm and dry NEUROLOGIC:  Alert and oriented x 3 PSYCHIATRIC:  Normal affect      Signed, Shirlee More, MD  12/16/2018 8:47 AM  [facility]

## 2018-12-16 ENCOUNTER — Other Ambulatory Visit: Payer: Self-pay

## 2018-12-16 ENCOUNTER — Ambulatory Visit (INDEPENDENT_AMBULATORY_CARE_PROVIDER_SITE_OTHER): Payer: Medicare HMO | Admitting: Cardiology

## 2018-12-16 ENCOUNTER — Encounter: Payer: Self-pay | Admitting: *Deleted

## 2018-12-16 ENCOUNTER — Encounter: Payer: Self-pay | Admitting: Cardiology

## 2018-12-16 VITALS — BP 120/78 | HR 93 | Ht 65.5 in | Wt 128.0 lb

## 2018-12-16 DIAGNOSIS — E785 Hyperlipidemia, unspecified: Secondary | ICD-10-CM | POA: Diagnosis not present

## 2018-12-16 DIAGNOSIS — C8330 Diffuse large B-cell lymphoma, unspecified site: Secondary | ICD-10-CM

## 2018-12-16 DIAGNOSIS — E782 Mixed hyperlipidemia: Secondary | ICD-10-CM | POA: Diagnosis not present

## 2018-12-16 DIAGNOSIS — I2510 Atherosclerotic heart disease of native coronary artery without angina pectoris: Secondary | ICD-10-CM

## 2018-12-16 DIAGNOSIS — I6523 Occlusion and stenosis of bilateral carotid arteries: Secondary | ICD-10-CM

## 2018-12-16 NOTE — Patient Instructions (Signed)
Medication Instructions:  Your physician recommends that you continue on your current medications as directed. Please refer to the Current Medication list given to you today.  If you need a refill on your cardiac medications before your next appointment, please call your pharmacy.   Lab work: None  If you have labs (blood work) drawn today and your tests are completely normal, you will receive your results only by: [name] Kitchen MyChart Message (if you have MyChart) OR . A paper copy in the mail If you have any lab test that is abnormal or we need to change your treatment, we will call you to review the results.  Testing/Procedures: You had an EKG today.   Your physician has requested that you have an echocardiogram. Echocardiography is a painless test that uses sound waves to create images of your heart. It provides your doctor with information about the size and shape of your heart and how well your heart's chambers and valves are working. This procedure takes approximately one hour. There are no restrictions for this procedure.  Your physician has requested that you have a lexiscan myoview. For further information please visit [email or web address]. Please follow instruction sheet, as given.  Follow-Up: At [facility], you and your health needs are our priority.  As part of our continuing mission to provide you with exceptional heart care, we have created designated Provider Care Teams.  These Care Teams include your primary Cardiologist (physician) and Advanced Practice Providers (APPs -  Physician Assistants and Nurse Practitioners) who all work together to provide you with the care you need, when you need it. You will need a follow up appointment in 8 weeks.       Echocardiogram An echocardiogram is a procedure that uses painless sound waves (ultrasound) to produce an image of the heart. Images from an echocardiogram can provide important information about:  Signs of coronary artery disease  (CAD).  Aneurysm detection. An aneurysm is a weak or damaged part of an artery wall that bulges out from the normal force of blood pumping through the body.  Heart size and shape. Changes in the size or shape of the heart can be associated with certain conditions, including heart failure, aneurysm, and CAD.  Heart muscle function.  Heart valve function.  Signs of a past heart attack.  Fluid buildup around the heart.  Thickening of the heart muscle.  A tumor or infectious growth around the heart valves. Tell a health care provider about:  Any allergies you have.  All medicines you are taking, including vitamins, herbs, eye drops, creams, and over-the-counter medicines.  Any blood disorders you have.  Any surgeries you have had.  Any medical conditions you have.  Whether you are pregnant or may be pregnant. What are the risks? Generally, this is a safe procedure. However, problems may occur, including:  Allergic reaction to dye (contrast) that may be used during the procedure. What happens before the procedure? No specific preparation is needed. You may eat and drink normally. What happens during the procedure?   An IV tube may be inserted into one of your veins.  You may receive contrast through this tube. A contrast is an injection that improves the quality of the pictures from your heart.  A gel will be applied to your chest.  A wand-like tool (transducer) will be moved over your chest. The gel will help to transmit the sound waves from the transducer.  The sound waves will harmlessly bounce off of your heart to allow  the heart images to be captured in real-time motion. The images will be recorded on a computer. The procedure may vary among health care providers and hospitals. What happens after the procedure?  You may return to your normal, everyday life, including diet, activities, and medicines, unless your health care provider tells you not to do that. Summary   An echocardiogram is a procedure that uses painless sound waves (ultrasound) to produce an image of the heart.  Images from an echocardiogram can provide important information about the size and shape of your heart, heart muscle function, heart valve function, and fluid buildup around your heart.  You do not need to do anything to prepare before this procedure. You may eat and drink normally.  After the echocardiogram is completed, you may return to your normal, everyday life, unless your health care provider tells you not to do that. This information is not intended to replace advice given to you by your health care provider. Make sure you discuss any questions you have with your health care provider. Document Released: 03/28/2000 Document Revised: 07/22/2018 Document Reviewed: 05/03/2016 Elsevier Patient Education  2020 [place].     Cardiac Nuclear Scan A cardiac nuclear scan is a test that is done to check the flow of blood to your heart. It is done when you are resting and when you are exercising. The test looks for problems such as:  Not enough blood reaching a portion of the heart.  The heart muscle not working as it should. You may need this test if:  You have heart disease.  You have had lab results that are not normal.  You have had heart surgery or a balloon procedure to open up blocked arteries (angioplasty).  You have chest pain.  You have shortness of breath. In this test, a special dye (tracer) is put into your bloodstream. The tracer will travel to your heart. A camera will then take pictures of your heart to see how the tracer moves through your heart. This test is usually done at a hospital and takes 2-4 hours. Tell a doctor about:  Any allergies you have.  All medicines you are taking, including vitamins, herbs, eye drops, creams, and over-the-counter medicines.  Any problems you or family members have had with anesthetic medicines.  Any blood  disorders you have.  Any surgeries you have had.  Any medical conditions you have.  Whether you are pregnant or may be pregnant. What are the risks? Generally, this is a safe test. However, problems may occur, such as:  Serious chest pain and heart attack. This is only a risk if the stress portion of the test is done.  Rapid heartbeat.  A feeling of warmth in your chest. This feeling usually does not last long.  Allergic reaction to the tracer. What happens before the test?  Ask your doctor about changing or stopping your normal medicines. This is important.  Follow instructions from your doctor about what you cannot eat or drink.  Remove your jewelry on the day of the test. What happens during the test?  An IV tube will be inserted into one of your veins.  Your doctor will give you a small amount of tracer through the IV tube.  You will wait for 20-40 minutes while the tracer moves through your bloodstream.  Your heart will be monitored with an electrocardiogram (ECG).  You will lie down on an exam table.  Pictures of your heart will be taken for about 15-20  minutes.  You may also have a stress test. For this test, one of these things may be done: ? You will be asked to exercise on a treadmill or a stationary bike. ? You will be given medicines that will make your heart work harder. This is done if you are unable to exercise.  When blood flow to your heart has peaked, a tracer will again be given through the IV tube.  After 20-40 minutes, you will get back on the exam table. More pictures will be taken of your heart.  Depending on the tracer that is used, more pictures may need to be taken 3-4 hours later.  Your IV tube will be removed when the test is over. The test may vary among doctors and hospitals. What happens after the test?  Ask your doctor: ? Whether you can return to your normal schedule, including diet, activities, and medicines. ? Whether you should  drink more fluids. This will help to remove the tracer from your body. Drink enough fluid to keep your pee (urine) pale yellow.  Ask your doctor, or the department that is doing the test: ? When will my results be ready? ? How will I get my results? Summary  A cardiac nuclear scan is a test that is done to check the flow of blood to your heart.  Tell your doctor whether you are pregnant or may be pregnant.  Before the test, ask your doctor about changing or stopping your normal medicines. This is important.  Ask your doctor whether you can return to your normal activities. You may be asked to drink more fluids. This information is not intended to replace advice given to you by your health care provider. Make sure you discuss any questions you have with your health care provider. Document Released: 09/14/2017 Document Revised: 07/21/2018 Document Reviewed: 09/14/2017 Elsevier Patient Education  2020 [employer].

## 2018-12-17 ENCOUNTER — Ambulatory Visit (HOSPITAL_BASED_OUTPATIENT_CLINIC_OR_DEPARTMENT_OTHER)
Admission: RE | Admit: 2018-12-17 | Discharge: 2018-12-17 | Disposition: A | Discharge status: Home / Self Care | Payer: Medicare HMO | Source: Ambulatory Visit | Attending: Family Medicine | Admitting: Family Medicine

## 2018-12-17 DIAGNOSIS — E041 Nontoxic single thyroid nodule: Secondary | ICD-10-CM | POA: Diagnosis not present

## 2018-12-21 ENCOUNTER — Other Ambulatory Visit (HOSPITAL_BASED_OUTPATIENT_CLINIC_OR_DEPARTMENT_OTHER): Payer: Medicare HMO

## 2018-12-21 ENCOUNTER — Telehealth (HOSPITAL_COMMUNITY): Payer: Self-pay | Admitting: *Deleted

## 2018-12-21 ENCOUNTER — Encounter (HOSPITAL_COMMUNITY): Payer: Self-pay | Admitting: *Deleted

## 2018-12-21 NOTE — Telephone Encounter (Signed)
Left message on voicemail per DPR in reference to upcoming appointment scheduled on 12/29/2018 at 0800 with detailed instructions given per Myocardial Perfusion Study Information Sheet for the test. LM to arrive 15 minutes early, and that it is imperative to arrive on time for appointment to keep from having the test rescheduled. If you need to cancel or reschedule your appointment, please call the office within 24 hours of your appointment. Failure to do so may result in a cancellation of your appointment, and a $50 no show fee. Phone number given for call back for any questions. Carleta Woodrow, Lucius Conn letter sent with instructions

## 2018-12-23 ENCOUNTER — Telehealth: Payer: Self-pay

## 2018-12-23 DIAGNOSIS — E785 Hyperlipidemia, unspecified: Secondary | ICD-10-CM

## 2018-12-23 NOTE — Telephone Encounter (Signed)
Copied from [place] [id number]. Topic: General - Other >> Dec 22, 2018  2:28 PM Mathis Bud wrote: Reason for CRM: Patient is requesting a call back from nurse to go over her thyroid results Patient call back [phone]

## 2018-12-23 NOTE — Telephone Encounter (Signed)
Called patient and discussed lab results along with ultrasound results with patient. She verbalized understanding. She states she has been taking her fenofibrate and lovastatin. She however needs a refill on the fenofibrate [facility] to [facility] on [address]. I have not sent this in yet  As I am not sure if you were planning on increasing it.   She is requesting a call back once you consult with [facility].

## 2018-12-24 ENCOUNTER — Ambulatory Visit (HOSPITAL_BASED_OUTPATIENT_CLINIC_OR_DEPARTMENT_OTHER)
Admission: RE | Admit: 2018-12-24 | Discharge: 2018-12-24 | Disposition: A | Discharge status: Home / Self Care | Payer: Medicare HMO | Source: Ambulatory Visit | Attending: Family Medicine | Admitting: Family Medicine

## 2018-12-24 ENCOUNTER — Encounter: Payer: Self-pay | Admitting: Family Medicine

## 2018-12-24 ENCOUNTER — Other Ambulatory Visit: Payer: Self-pay

## 2018-12-24 DIAGNOSIS — Z780 Asymptomatic menopausal state: Secondary | ICD-10-CM | POA: Diagnosis not present

## 2018-12-24 DIAGNOSIS — E2839 Other primary ovarian failure: Secondary | ICD-10-CM | POA: Diagnosis not present

## 2018-12-24 DIAGNOSIS — M85852 Other specified disorders of bone density and structure, left thigh: Secondary | ICD-10-CM | POA: Diagnosis not present

## 2018-12-24 MED ORDER — LOVASTATIN 40 MG PO TABS: 40.0000 mg | ORAL_TABLET | Freq: Two times a day (BID) | ORAL | 3 refills | Status: DC

## 2018-12-24 MED ORDER — FENOFIBRATE 145 MG PO TABS: 145.0000 mg | ORAL_TABLET | Freq: Every day | ORAL | 3 refills | Status: DC

## 2018-12-24 NOTE — Addendum Note (Signed)
Addended by: Lamar Blinks C on: 12/24/2018 06:42 AM   Modules accepted: Orders

## 2018-12-25 ENCOUNTER — Encounter: Payer: Self-pay | Admitting: Family Medicine

## 2018-12-25 ENCOUNTER — Other Ambulatory Visit: Payer: Self-pay | Admitting: Family Medicine

## 2018-12-25 DIAGNOSIS — E041 Nontoxic single thyroid nodule: Secondary | ICD-10-CM

## 2018-12-25 DIAGNOSIS — M8580 Other specified disorders of bone density and structure, unspecified site: Secondary | ICD-10-CM | POA: Insufficient documentation

## 2018-12-25 DIAGNOSIS — M810 Age-related osteoporosis without current pathological fracture: Secondary | ICD-10-CM | POA: Insufficient documentation

## 2018-12-29 ENCOUNTER — Ambulatory Visit (HOSPITAL_BASED_OUTPATIENT_CLINIC_OR_DEPARTMENT_OTHER): Payer: Medicare HMO

## 2018-12-29 ENCOUNTER — Other Ambulatory Visit: Payer: Self-pay

## 2018-12-29 ENCOUNTER — Ambulatory Visit (HOSPITAL_COMMUNITY): Payer: Medicare HMO | Attending: Cardiovascular Disease

## 2018-12-29 VITALS — Ht 65.0 in | Wt 128.0 lb

## 2018-12-29 DIAGNOSIS — I2510 Atherosclerotic heart disease of native coronary artery without angina pectoris: Secondary | ICD-10-CM

## 2018-12-29 LAB — MYOCARDIAL PERFUSION IMAGING
LV dias vol: 84 mL (ref 46–106)
LV sys vol: 45 mL
Peak HR: 100 {beats}/min
Rest HR: 81 {beats}/min
SDS: 1
SRS: 2
SSS: 3
TID: 0.81

## 2018-12-29 LAB — ECHOCARDIOGRAM COMPLETE
Height: 65 in
Weight: 2048 oz

## 2018-12-29 MED ORDER — TECHNETIUM TC 99M TETROFOSMIN IV KIT
32.7000 | PACK | Freq: Once | INTRAVENOUS | Status: AC | PRN
  Administered 2018-12-29: 32.7 via INTRAVENOUS
  Filled 2018-12-29: qty 33

## 2018-12-29 MED ORDER — REGADENOSON 0.4 MG/5ML IV SOLN
0.4000 mg | Freq: Once | INTRAVENOUS | Status: AC
  Administered 2018-12-29: 0.4 mg via INTRAVENOUS

## 2018-12-29 MED ORDER — TECHNETIUM TC 99M TETROFOSMIN IV KIT
10.9000 | PACK | Freq: Once | INTRAVENOUS | Status: AC | PRN
  Administered 2018-12-29: 10.9 via INTRAVENOUS
  Filled 2018-12-29: qty 11

## 2019-01-06 ENCOUNTER — Telehealth: Payer: Self-pay | Admitting: Neurology

## 2019-01-06 NOTE — Telephone Encounter (Signed)
I reached out to the pt. She reports since starting the Aubagio in late July she has been struggling with diarrhea, upset stomach, and weight loss ( pt is down 8 lbs). Pt does have a history of Crohn's disease and is unsure if the medication is exacerbating her sx? Pt has not had a repeat blood test done in our office because she saw her PCP at the end of August and was under the impression her liver enzymes were checked. Per epic PCP check A1C, LDL, TSH and Lipid panel.  Last CBC with diff/cmp check was 10/14/2018.   Best call back # is [phone].

## 2019-01-06 NOTE — Telephone Encounter (Signed)
I called the patient, she cannot tolerate the Aubagio, she will stop the medication.  I have suggested either going back on Gilenya or trying 1 of the newer medication such as Mayzent or Zeposia.  These have mechanisms very similar to Gilenya which was well-tolerated for her.  She has concerns that Gilenya may increase the risk of cancer, I suspect the risk is quite low.  She will read about these new medications and contact me in terms of what she wishes to do.

## 2019-01-06 NOTE — Telephone Encounter (Signed)
Pt called stating that the medication she was put on last is upsetting her stomach and she has lost 8lb because of it. She would like to be called back to discuss alternative's.

## 2019-01-12 ENCOUNTER — Encounter: Payer: Self-pay | Admitting: Family Medicine

## 2019-01-17 ENCOUNTER — Encounter: Payer: Self-pay | Admitting: Gastroenterology

## 2019-02-02 NOTE — Progress Notes (Signed)
Cardiology Office Note:    Date:  02/03/2019   ID:  [name], DOB [birth year], MRN [id number]  PCP:  Darreld Mclean, MD  Cardiologist:  Shirlee More, MD    Referring MD: Darreld Mclean, MD    ASSESSMENT:    1. Dilated cardiomyopathy ([place])   2. Coronary artery calcification   3. Dyslipidemia    PLAN:    In order of problems listed above:  1. There is evidence of mild cardiomyopathy with heart failure that I suspect clinically is related to chemotherapy.  Her blood pressure is soft I will place her on a beta-blocker and will watch heart rate and blood pressure at home reassess in the office in 4 weeks and plan a repeat echocardiogram in 6 months.  If ejection fraction becomes severely reduced or heart failure would benefit from more aggressive treatment such as Entresto diuretic or MRA but I would not initiate at this time. 2. She has coronary artery calcification without ischemia continue lipid-lowering treatment 3. Continue her statin   Next appointment: 4 weeks   Medication Adjustments/Labs and Tests Ordered: Current medicines are reviewed at length with the patient today.  Concerns regarding medicines are outlined above.  No orders of the defined types were placed in this encounter.  No orders of the defined types were placed in this encounter.   No chief complaint on file.   History of Present Illness:    [name] is a 78 y.o. female with a hx of non-Hodgkin's lymphoma treated with RCHOP Methotrexate and XRT, Crohn's disease, MS, hyperlipidemia, memory loss and CAC  last seen 12/16/2018. Compliance with diet, lifestyle and medications: Yes  She has good healthcare literacy and review the results of her testing and she has mild cardiomyopathy with no evidence of heart failure.  I am concerned because at times the trajectory of this illness is 1 of progression in these people develop severe left ventricular dysfunction heart failure I think we  should initiate some medical therapy.  Her blood pressure is soft we will put her on a selective beta-blocker she will monitor heart rate and blood pressure at home see my nurse practitioner in the office 4 weeks and follow-up me in 6 months with echocardiogram.  No edema orthopnea shortness of breath chest pain or palpitation.  She has coronary artery calcification without demonstrable ischemia and normal perfusion study and she will continue lipid-lowering treatment Past Medical History:  Diagnosis Date  . Arthritis, degenerative    both knees  . B12 deficiency 07/06/2014  . Cancer ([place])   . Colitis   . Diverticulosis   . Duodenitis   . Fatty liver   . Gastritis   . Helicobacter pylori gastritis   . Hyperlipidemia   . Hypertension    not treated  . IBD (inflammatory bowel disease)   . Lymphoma ([place])   . Memory difficulty 01/05/2013  . Memory loss    mild  . Multiple sclerosis ([place])   . Osteopenia   . Vitamin B12 deficiency     Past Surgical History:  Procedure Laterality Date  . ABDOMINAL HYSTERECTOMY  2010  . BLADDER SURGERY  2010   Bladder Tack  . CERVICAL SPINE SURGERY    . CHOLECYSTECTOMY    . HEMORRHOID SURGERY    . ROTATOR CUFF REPAIR    . SPINAL FUSION    . TUBAL LIGATION      Current Medications: Current Meds  Medication Sig  . Ascorbic Acid (VITAMIN  C) 1000 MG tablet Take 1,000 mg by mouth daily.  [name] Kitchen b complex vitamins tablet Take 1 tablet by mouth daily.  . Cholecalciferol (VITAMIN D3) 5000 units TABS Take 5,000 Units by mouth daily.   [name] Kitchen desonide (DESOWEN) 0.05 % lotion Apply topically 2 (two) times daily. Use as needed for itchy ears  . fenofibrate (TRICOR) 145 MG tablet Take 1 tablet (145 mg total) by mouth daily.  [name] Kitchen gabapentin (NEURONTIN) 600 MG tablet Take 1 tablet (600 mg total) by mouth 3 (three) times daily.  . Glucosamine 500 MG TABS Take 1 tablet by mouth 2 (two) times daily.   [name] Kitchen loratadine (CLARITIN) 10 MG tablet Take 1 tablet (10 mg total) by mouth  daily as needed for allergies.  [name] Kitchen lovastatin (MEVACOR) 40 MG tablet Take 1 tablet (40 mg total) by mouth 2 (two) times daily.  . memantine (NAMENDA) 10 MG tablet TAKE 1 TABLET BY MOUTH TWICE A DAY  . Multiple Vitamin (MULTIVITAMIN) tablet Take 1 tablet by mouth daily.   . Probiotic Product (ALIGN PO) Take 1 tablet by mouth daily.      Allergies:   Sulfa antibiotics, Vytorin [ezetimibe-simvastatin], Cephalexin, Doxycycline, Erythromycin, Macrobid [nitrofurantoin macrocrystal], Morphine, Sulfonamide derivatives, and Amoxicillin   Social History   Socioeconomic History  . Marital status: Married    Spouse name: Eduard Clos   . Number of children: 4  . Years of education: 12+  . Highest education level: Not on file  Occupational History  . Occupation: Retired     Fish farm manager: [employer]  . Financial resource strain: Not on file  . Food insecurity    Worry: Not on file    Inability: Not on file  . Transportation needs    Medical: Not on file    Non-medical: Not on file  Tobacco Use  . Smoking status: Former Smoker    Types: Cigarettes    Quit date: 06/27/1997    Years since quitting: 21.6  . Smokeless tobacco: Never Used  Substance and Sexual Activity  . Alcohol use: No  . Drug use: No  . Sexual activity: Not Currently  Lifestyle  . Physical activity    Days per week: Not on file    Minutes per session: Not on file  . Stress: Not on file  Relationships  . Social Herbalist on phone: Not on file    Gets together: Not on file    Attends religious service: Not on file    Active member of club or organization: Not on file    Attends meetings of clubs or organizations: Not on file    Relationship status: Not on file  Other Topics Concern  . Not on file  Social History Narrative   Caffeine daily.    Patient lives at home with husband Eduard Clos but goes by [employer].    Patient has 4 children.    Patient has 1 year of college.     Patient is retired.    Patient is left handed.    Patient drinks 2 cups of coffee per day.     Family History: The patient's family history includes Breast cancer in her cousin; Colon cancer in her maternal grandmother; Colon polyps in her brother and mother; Diabetes in her father; Heart disease in her father; Hypertension in her brother. ROS:   Please see the history of present illness.    All other systems reviewed and are negative.  EKGs/Labs/Other Studies  Reviewed:    The following studies were reviewed today:   Echo 12/29/2018: IMPRESSIONS  1. The left ventricle has mildly reduced systolic function, with an ejection fraction of 45-50%. The cavity size was normal. Left ventricular diastolic Doppler parameters are consistent with impaired relaxation. Left ventricular diffuse hypokinesis.  2. The right ventricle has normal systolic function. The cavity was normal. There is no increase in right ventricular wall thickness.  3. There is mild mitral annular calcification present.  4. The aortic valve is tricuspid. Moderate thickening of the aortic valve. Moderate calcification of the aortic valve. Aortic valve regurgitation is mild by color flow Doppler.  5. The aorta is normal unless otherwise noted.  6. The average left ventricular global longitudinal strain is -15.3 %.  MPI 12/16/2018: Study Highlights  Nuclear stress EF: 46%. The left ventricular ejection fraction is mildly decreased (45-54%).  There was no ST segment deviation noted during stress.  This is a low risk study. There is no ischemia or evidence of previous infarction. The EF is mildly depressed.    Carotid duplex 08/10/2017: Final Interpretation: Right Carotid: Velocities in the right ICA are consistent with a 1-39% stenosis. Left Carotid: Velocities in the left ICA are consistent with a 1-39% stenosis. Vertebrals: Bilateral vertebral arteries demonstrate antegrade flow. Subclavians: Normal flow hemodynamics  were seen in bilateral subclavian arteries.  PET at [facility] 12/06/2018 with severe diffuse coronary artery calcification  Recent Labs: 02/28/2018: Magnesium 2.0 10/14/2018: ALT 13; BUN 19; Creatinine, Ser 0.97; Hemoglobin 13.0; Platelets 239; Potassium 4.0; Sodium 144 12/13/2018: TSH 1.57  Recent Lipid Panel    Component Value Date/Time   CHOL 114 12/13/2018 0857   TRIG 368.0 (H) 12/13/2018 0857   HDL 36.20 (L) 12/13/2018 0857   CHOLHDL 3 12/13/2018 0857   VLDL 73.6 (H) 12/13/2018 0857   LDLCALC 24 11/23/2017 0947   LDLDIRECT 37.0 12/13/2018 0857    Physical Exam:    VS:  BP 110/74 (BP Location: Right Arm, Patient Position: Sitting, Cuff Size: Normal)   Pulse 98   Ht 5' 5.5" (1.664 m)   Wt 122 lb (55.3 kg)   SpO2 98%   BMI 19.99 kg/m     Wt Readings from Last 3 Encounters:  02/03/19 122 lb (55.3 kg)  12/29/18 128 lb (58.1 kg)  12/16/18 128 lb (58.1 kg)     GEN:  Well nourished, well developed in no acute distress HEENT: Normal NECK: No JVD; No carotid bruits LYMPHATICS: No lymphadenopathy CARDIAC: RRR, no murmurs, rubs, gallops RESPIRATORY:  Clear to auscultation without rales, wheezing or rhonchi  ABDOMEN: Soft, non-tender, non-distended MUSCULOSKELETAL:  No edema; No deformity  SKIN: Warm and dry NEUROLOGIC:  Alert and oriented x 3 PSYCHIATRIC:  Normal affect    Signed, Shirlee More, MD  02/03/2019 8:35 AM    [facility]

## 2019-02-03 ENCOUNTER — Encounter: Payer: Self-pay | Admitting: Cardiology

## 2019-02-03 ENCOUNTER — Other Ambulatory Visit: Payer: Self-pay

## 2019-02-03 ENCOUNTER — Ambulatory Visit (INDEPENDENT_AMBULATORY_CARE_PROVIDER_SITE_OTHER): Payer: Medicare HMO | Admitting: Cardiology

## 2019-02-03 VITALS — BP 110/74 | HR 98 | Ht 65.5 in | Wt 122.0 lb

## 2019-02-03 DIAGNOSIS — I420 Dilated cardiomyopathy: Secondary | ICD-10-CM | POA: Diagnosis not present

## 2019-02-03 DIAGNOSIS — E785 Hyperlipidemia, unspecified: Secondary | ICD-10-CM

## 2019-02-03 DIAGNOSIS — I2510 Atherosclerotic heart disease of native coronary artery without angina pectoris: Secondary | ICD-10-CM | POA: Diagnosis not present

## 2019-02-03 DIAGNOSIS — I2584 Coronary atherosclerosis due to calcified coronary lesion: Secondary | ICD-10-CM | POA: Diagnosis not present

## 2019-02-03 NOTE — Patient Instructions (Signed)
Medication Instructions:  Your physician recommends that you continue on your current medications as directed. Please refer to the Current Medication list given to you today.  *If you need a refill on your cardiac medications before your next appointment, please call your pharmacy*  Lab Work: None  If you have labs (blood work) drawn today and your tests are completely normal, you will receive your results only by: [name] Kitchen MyChart Message (if you have MyChart) OR . A paper copy in the mail If you have any lab test that is abnormal or we need to change your treatment, we will call you to review the results.  Testing/Procedures: None  Follow-Up: At [facility], you and your health needs are our priority.  As part of our continuing mission to provide you with exceptional heart care, we have created designated Provider Care Teams.  These Care Teams include your primary Cardiologist (physician) and Advanced Practice Providers (APPs -  Physician Assistants and Nurse Practitioners) who all work together to provide you with the care you need, when you need it.  Your next appointment:   1 month  The format for your next appointment:   In Person  Provider:   Laurann Montana, FNP [facility] Office)  Other Instructions **Follow up with Dr. Bettina Gavia in [facility] in 6 months  **Check your heart rate and blood pressure daily and record these readings. Please bring this BP log with you to your next appointment with review. Your systolic BP (top number) should be greater than 100.

## 2019-02-16 ENCOUNTER — Ambulatory Visit: Payer: Medicare HMO | Admitting: Gastroenterology

## 2019-02-21 DIAGNOSIS — C8339 Diffuse large B-cell lymphoma, extranodal and solid organ sites: Secondary | ICD-10-CM | POA: Diagnosis not present

## 2019-02-23 ENCOUNTER — Other Ambulatory Visit (INDEPENDENT_AMBULATORY_CARE_PROVIDER_SITE_OTHER): Payer: Medicare HMO

## 2019-02-23 ENCOUNTER — Encounter: Payer: Self-pay | Admitting: Gastroenterology

## 2019-02-23 ENCOUNTER — Ambulatory Visit (INDEPENDENT_AMBULATORY_CARE_PROVIDER_SITE_OTHER): Payer: Medicare HMO | Admitting: Gastroenterology

## 2019-02-23 ENCOUNTER — Other Ambulatory Visit: Payer: Self-pay

## 2019-02-23 VITALS — BP 116/62 | HR 75 | Temp 97.4°F | Ht 65.5 in | Wt 123.6 lb

## 2019-02-23 DIAGNOSIS — R197 Diarrhea, unspecified: Secondary | ICD-10-CM

## 2019-02-23 DIAGNOSIS — Z8719 Personal history of other diseases of the digestive system: Secondary | ICD-10-CM

## 2019-02-23 DIAGNOSIS — Z1159 Encounter for screening for other viral diseases: Secondary | ICD-10-CM

## 2019-02-23 DIAGNOSIS — K50919 Crohn's disease, unspecified, with unspecified complications: Secondary | ICD-10-CM

## 2019-02-23 LAB — VITAMIN B12: Vitamin B-12: 151 pg/mL — ABNORMAL LOW (ref 211–911)

## 2019-02-23 LAB — FOLATE: Folate: >24.1 ng/mL (ref >5.9)

## 2019-02-23 LAB — HIGH SENSITIVITY CRP: CRP, High Sensitivity: 5.93 mg/L — ABNORMAL HIGH (ref 0.000–5.000)

## 2019-02-23 LAB — SEDIMENTATION RATE: Sed Rate: 30 mm/hr (ref 0–30)

## 2019-02-23 MED ORDER — SUPREP BOWEL PREP KIT 17.5-3.13-1.6 GM/177ML PO SOLN: 1.0000 | Freq: Once | ORAL | 0 refills | Status: AC

## 2019-02-23 NOTE — Progress Notes (Signed)
[name]    [id number]    06-Oct-1940  Primary Care Physician:Holder, Vicki Filler, MD  Referring Physician: Darreld Holder, [address] [place],  [name] [address]   Chief complaint:  Diarrhea, abdominal pain  HPI: 78 year old female with history of multiple sclerosis, diverticulosis of colon and Crohn's disease, previously followed by Dr. Olevia Holder is here to reestablish care. She was last seen in July 2016 by Dr. Olevia Holder. She is currently not on any immunosuppressive therapy or maintenance therapy for Crohn's disease. Complains of intermittent left lower quadrant abdominal pain radiating across her lower belly.  She also has intermittent Fecal urgency and diarrhea.  Denies melena, blood in stool or blood per rectum.  No recent change in weight.  CT abdomen and pelvis 03/04/2018: Subacute diverticulitis involving proximal sigmoid colon  Colonoscopy March 2016: cecal ulcer, biopsies chronic minimal active colitis.   Outpatient Encounter Medications as of 02/23/2019  Medication Sig   Ascorbic Acid (VITAMIN C) 1000 MG tablet Take 1,000 mg by mouth daily.   b complex vitamins tablet Take 1 tablet by mouth daily.   Cholecalciferol (VITAMIN D3) 5000 units TABS Take 5,000 Units by mouth daily.    desonide (DESOWEN) 0.05 % lotion Apply topically 2 (two) times daily. Use as needed for itchy ears   fenofibrate (TRICOR) 145 MG tablet Take 1 tablet (145 mg total) by mouth daily.   gabapentin (NEURONTIN) 600 MG tablet Take 1 tablet (600 mg total) by mouth 3 (three) times daily.   Glucosamine 500 MG TABS Take 1 tablet by mouth 2 (two) times daily.    loratadine (CLARITIN) 10 MG tablet Take 1 tablet (10 mg total) by mouth daily as needed for allergies.   lovastatin (MEVACOR) 40 MG tablet Take 1 tablet (40 mg total) by mouth 2 (two) times daily.   memantine (NAMENDA) 10 MG tablet TAKE 1 TABLET BY MOUTH TWICE A DAY   Multiple Vitamin  (MULTIVITAMIN) tablet Take 1 tablet by mouth daily.    Probiotic Product (ALIGN PO) Take 1 tablet by mouth daily.    No facility-administered encounter medications on file as of 02/23/2019.     Allergies as of 02/23/2019 - Review Complete 02/23/2019  Allergen Reaction Noted   Sulfa antibiotics Hives and Nausea And Vomiting 11/28/2008   Vytorin [ezetimibe-simvastatin]  07/29/2013   Cephalexin  11/23/2017   Doxycycline  03/13/2011   Erythromycin  11/28/2008   Macrobid [nitrofurantoin macrocrystal] Hives 12/22/2012   Morphine Nausea And Vomiting 11/28/2008   Sulfonamide derivatives Hives and Nausea And Vomiting 11/28/2008   Amoxicillin Hives 11/28/2008    Past Medical History:  Diagnosis Date   Arthritis, degenerative    both knees   B12 deficiency 07/06/2014   Cancer ([place])    Colitis    Diverticulosis    Duodenitis    Fatty liver    Gastritis    Helicobacter pylori gastritis    Hyperlipidemia    Hypertension    not treated   IBD (inflammatory bowel disease)    Lymphoma ([place])    Memory difficulty 01/05/2013   Memory loss    mild   Multiple sclerosis ([place])    Osteopenia    Vitamin B12 deficiency     Past Surgical History:  Procedure Laterality Date   ABDOMINAL HYSTERECTOMY  2010   BLADDER SURGERY  2010   Bladder Tack   CERVICAL SPINE SURGERY     CHOLECYSTECTOMY  HEMORRHOID SURGERY     ROTATOR CUFF REPAIR     SPINAL FUSION     TUBAL LIGATION      Family History  Problem Relation Age of Onset   Diabetes Father    Heart disease Father    Colon polyps Mother    Breast cancer Cousin    Colon cancer Maternal Grandmother        ? 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   Food insecurity    Worry: Not on file    Inability: Not on file   Transportation needs    Medical: Not on file    Non-medical: Not on file  Tobacco Use   Smoking status: Former Smoker    Types: Cigarettes    Quit date: 06/27/1997    Years since quitting: 21.6   Smokeless tobacco: Never Used  Substance and Sexual Activity   Alcohol use: No   Drug use: No   Sexual activity: Not Currently  Lifestyle   Physical activity    Days per week: Not on file    Minutes per session: Not on file   Stress: Not on file  Relationships   Social connections    Talks on phone: Not on file    Gets together: Not on file    Attends religious service: Not on file    Active member of club or organization: Not on file    Attends meetings of clubs or organizations: Not on file    Relationship status: Not on file   Intimate partner violence    Fear of current or ex partner: Not on file    Emotionally abused: Not on file    Physically abused: Not on file    Forced sexual activity: Not on file  Other Topics Concern   Not on file  Social History Narrative   Caffeine daily.    Patient lives at home with husband [name] but goes by [employer].    Patient has 4 children.    Patient has 1 year of college.    Patient is retired.    Patient is left handed.    Patient drinks 2 cups of coffee per day.      Review of systems: Review of Systems  Constitutional: Negative for fever and chills.  HENT: Negative.   Eyes: Negative for blurred vision.  Respiratory: Negative for cough, shortness of breath and wheezing.   Cardiovascular: Negative for chest pain and palpitations.  Gastrointestinal: as per HPI Genitourinary: Negative for dysuria, urgency, frequency and hematuria.  Musculoskeletal: Negative for myalgias, back pain and  joint pain.  Skin: Negative for itching and rash.  Neurological: Negative for dizziness, tremors, focal weakness, seizures and loss of consciousness.  Endo/Heme/Allergies: Positive for seasonal allergies.  Psychiatric/Behavioral: Negative for depression, suicidal ideas and hallucinations.  All other systems reviewed and are negative.   Physical Exam: Vitals:   02/23/19 0843  BP: 116/62  Pulse: 75  Temp: (!) 97.4 F (36.3 C)   Body  mass index is 20.26 kg/m. Gen:      No acute distress HEENT:  EOMI, sclera anicteric Neck:     No masses; no thyromegaly Lungs:    Clear to auscultation bilaterally; normal respiratory effort CV:         Regular rate and rhythm; no murmurs Abd:      + bowel sounds; soft, non-tender; no palpable masses, no distension Ext:    No edema; adequate peripheral perfusion Skin:      Warm and dry; no rash Neuro: alert and oriented x 3 Psych: normal mood and affect  Data Reviewed:  Reviewed labs, radiology imaging, old records and pertinent past GI work up   Assessment and Plan/Recommendations:  78 year old female with history of multiple sclerosis, sigmoid diverticulitis and Crohn's disease with complaints of left lower quadrant abdominal pain, intermittent diarrhea and fecal urgency We will schedule for colonoscopy for evaluation, exclude active Crohn's disease/colitis Check ESR, CRP, folate and B12 level Return after colonoscopy to discuss further management and maintenance therapy for Crohn's disease based on findings on colonoscopy  The risks and benefits as well as alternatives of endoscopic procedure(s) have been discussed and reviewed. All questions answered. The patient agrees to proceed.  Greater than 50% of the time used for counseling as well as treatment plan and follow-up. She had multiple questions which were answered to her satisfaction  K. Denzil Magnuson , MD    CC: Holder, Vicki Filler, MD

## 2019-02-23 NOTE — Patient Instructions (Addendum)
Go to the basement for labs today  You have been scheduled for a colonoscopy. Please follow written instructions given to you at your visit today.  Please pick up your prep supplies at the pharmacy within the next 1-3 days. If you use inhalers (even only as needed), please bring them with you on the day of your procedure.   If you are age 77 or older, your body mass index should be between 23-30. Your Body mass index is 20.26 kg/m. If this is out of the aforementioned range listed, please consider follow up with your Primary Care Provider.  If you are age 67 or younger, your body mass index should be between 19-25. Your Body mass index is 20.26 kg/m. If this is out of the aformentioned range listed, please consider follow up with your Primary Care Provider.    I appreciate the  opportunity to care for you  Thank You   [name] , MD   Lactose-Free Diet, Adult (Trial for 1 week) If you have lactose intolerance, you are not able to digest lactose. Lactose is a natural sugar found mainly in dairy milk and dairy products. You may need to avoid all foods and beverages that contain lactose. A lactose-free diet can help you do this. Which foods have lactose? Lactose is found in dairy milk and dairy products, such as:  Yogurt.  Cheese.  Butter.  Margarine.  Sour cream.  Cream.  Whipped toppings and nondairy creamers.  Ice cream and other dairy-based desserts. Lactose is also found in foods or products made with dairy milk or milk ingredients. To find out whether a food contains dairy milk or a milk ingredient, look at the ingredients list. Avoid foods with the statement "May contain milk" and foods that contain:  Milk powder.  Whey.  Curd.  Caseinate.  Lactose.  Lactalbumin.  Lactoglobulin. What are alternatives to dairy milk and foods made with milk products?  Lactose-free milk.  Soy milk with added calcium and vitamin D.  Almond milk, coconut milk, rice  milk, or other nondairy milk alternatives with added calcium and vitamin D. Note that these are low in protein.  Soy products, such as soy yogurt, soy cheese, soy ice cream, and soy-based sour cream.  Other nut milk products, such as almond yogurt, almond cheese, cashew yogurt, cashew cheese, cashew ice cream, coconut yogurt, and coconut ice cream. What are tips for following this plan?  Do not consume foods, beverages, vitamins, minerals, or medicines containing lactose. Read ingredient lists carefully.  Look for the words "lactose-free" on labels.  Use lactase enzyme drops or tablets as directed by your health care provider.  Use lactose-free milk or a milk alternative, such as soy milk or almond milk, for drinking and cooking.  Make sure you get enough calcium and vitamin D in your diet. A lactose-free eating plan can be lacking in these important nutrients.  Take calcium and vitamin D supplements as directed by your health care provider. Talk to your health care provider about supplements if you are not able to get enough calcium and vitamin D from food. What foods can I eat?  Fruits All fresh, canned, frozen, or dried fruits that are not processed with lactose. Vegetables All fresh, frozen, and canned vegetables without cheese, cream, or butter sauces. Grains Any that are not made with dairy milk or dairy products. Meats and other proteins Any meat, fish, poultry, and other protein sources that are not made with dairy milk or dairy products.  Soy cheese and yogurt. Fats and oils Any that are not made with dairy milk or dairy products. Beverages Lactose-free milk. Soy, rice, or almond milk with added calcium and vitamin D. Fruit and vegetable juices. Sweets and desserts Any that are not made with dairy milk or dairy products. Seasonings and condiments Any that are not made with dairy milk or dairy products. Calcium Calcium is found in many foods that contain lactose and is  important for bone health. The amount of calcium you need depends on your age:  Adults younger than 50 years: 1,000 mg of calcium a day.  Adults older than 50 years: 1,200 mg of calcium a day. If you are not getting enough calcium, you may get it from other sources, including:  Orange juice with calcium added. There are 300-350 mg of calcium in 1 cup of orange juice.  Calcium-fortified soy milk. There are 300-400 mg of calcium in 1 cup of calcium-fortified soy milk.  Calcium-fortified rice or almond milk. There are 300 mg of calcium in 1 cup of calcium-fortified rice or almond milk.  Calcium-fortified breakfast cereals. There are 100-1,000 mg of calcium in calcium-fortified breakfast cereals.  Spinach, cooked. There are 145 mg of calcium in  cup of cooked spinach.  Edamame, cooked. There are 130 mg of calcium in  cup of cooked edamame.  Collard greens, cooked. There are 125 mg of calcium in  cup of cooked collard greens.  Kale, frozen or cooked. There are 90 mg of calcium in  cup of cooked or frozen kale.  Almonds. There are 95 mg of calcium in  cup of almonds.  Broccoli, cooked. There are 60 mg of calcium in 1 cup of cooked broccoli. The items listed above may not be a complete list of recommended foods and beverages. Contact a dietitian for more options. What foods are not recommended? Fruits None, unless they are made with dairy milk or dairy products. Vegetables None, unless they are made with dairy milk or dairy products. Grains Any grains that are made with dairy milk or dairy products. Meats and other proteins None, unless they are made with dairy milk or dairy products. Dairy All dairy products, including milk, goat's milk, buttermilk, kefir, acidophilus milk, flavored milk, evaporated milk, condensed milk, dulce de [place], eggnog, yogurt, cheese, and cheese spreads. Fats and oils Any that are made with milk or milk products. Margarines and salad dressings that  contain milk or cheese. Cream. Half and half. Cream cheese. Sour cream. Chip dips made with sour cream or yogurt. Beverages Hot chocolate. Cocoa with lactose. Instant iced teas. Powdered fruit drinks. Smoothies made with dairy milk or yogurt. Sweets and desserts Any that are made with milk or milk products. Seasonings and condiments Chewing gum that has lactose. Spice blends if they contain lactose. Artificial sweeteners that contain lactose. Nondairy creamers. The items listed above may not be a complete list of foods and beverages to avoid. Contact a dietitian for more information. Summary  If you are lactose intolerant, it means that you have a hard time digesting lactose, a natural sugar found in milk and milk products.  Following a lactose-free diet can help you manage this condition.  Calcium is important for bone health and is found in many foods that contain lactose. Talk with your health care provider about other sources of calcium. This information is not intended to replace advice given to you by your health care provider. Make sure you discuss any questions you have with your  health care provider. Document Released: 09/20/2001 Document Revised: 04/28/2017 Document Reviewed: 04/28/2017 Elsevier Patient Education  2020 [employer].

## 2019-02-24 ENCOUNTER — Encounter: Payer: Self-pay | Admitting: Family Medicine

## 2019-02-24 ENCOUNTER — Other Ambulatory Visit: Payer: Self-pay

## 2019-02-24 DIAGNOSIS — R69 Illness, unspecified: Secondary | ICD-10-CM | POA: Diagnosis not present

## 2019-02-24 MED ORDER — CYANOCOBALAMIN 1000 MCG/ML IJ SOLN: INTRAMUSCULAR | 11 refills | Status: DC

## 2019-02-24 MED ORDER — "SYRINGE 25G X 1"" 3 ML MISC": 11 refills | Status: DC

## 2019-03-02 ENCOUNTER — Ambulatory Visit (INDEPENDENT_AMBULATORY_CARE_PROVIDER_SITE_OTHER): Payer: Medicare HMO | Admitting: Family

## 2019-03-02 ENCOUNTER — Encounter: Payer: Self-pay | Admitting: Family

## 2019-03-02 ENCOUNTER — Other Ambulatory Visit: Payer: Self-pay

## 2019-03-02 VITALS — BP 134/74 | HR 85 | Ht 65.5 in | Wt 124.0 lb

## 2019-03-02 DIAGNOSIS — I420 Dilated cardiomyopathy: Secondary | ICD-10-CM | POA: Diagnosis not present

## 2019-03-02 DIAGNOSIS — I2510 Atherosclerotic heart disease of native coronary artery without angina pectoris: Secondary | ICD-10-CM | POA: Diagnosis not present

## 2019-03-02 DIAGNOSIS — E785 Hyperlipidemia, unspecified: Secondary | ICD-10-CM

## 2019-03-02 MED ORDER — METOPROLOL SUCCINATE ER 25 MG PO TB24: 12.5000 mg | ORAL_TABLET | Freq: Every day | ORAL | 1 refills | Status: DC

## 2019-03-02 NOTE — Progress Notes (Signed)
Office Visit    Patient Name: [name] Date of Encounter: 03/02/2019  Primary Care Provider:  Darreld Mclean, MD Primary Cardiologist:  Shirlee More, MD Electrophysiologist:  None   Chief Complaint    [name] is a 78 y.o. female with a hx of non-Hodgkin's lymphoma treated with RCHOP methotrexate and XRT, Chron's disease, MS, HLD, memory loss, CAC presents today for 1 month follow up of BP/HR.   Past Medical History    Past Medical History:  Diagnosis Date  . Arthritis, degenerative    both knees  . B12 deficiency 07/06/2014  . Cancer ([place])   . Colitis   . Diverticulosis   . Duodenitis   . Fatty liver   . Gastritis   . Helicobacter pylori gastritis   . Hyperlipidemia   . Hypertension    not treated  . IBD (inflammatory bowel disease)   . Lymphoma ([place])   . Memory difficulty 01/05/2013  . Memory loss    mild  . Multiple sclerosis ([place])   . Osteopenia   . Vitamin B12 deficiency    Past Surgical History:  Procedure Laterality Date  . ABDOMINAL HYSTERECTOMY  2010  . BLADDER SURGERY  2010   Bladder Tack  . CERVICAL SPINE SURGERY    . CHOLECYSTECTOMY    . HEMORRHOID SURGERY    . ROTATOR CUFF REPAIR    . SPINAL FUSION    . TUBAL LIGATION      Allergies  Allergies  Allergen Reactions  . Sulfa Antibiotics Hives and Nausea And Vomiting  . Vytorin [Ezetimibe-Simvastatin]     White skin patch  . Cephalexin   . Doxycycline     Raw mouth   . Erythromycin     REACTION: weakness  . Macrobid [Nitrofurantoin Macrocrystal] Hives  . Morphine Nausea And Vomiting  . Sulfonamide Derivatives Hives and Nausea And Vomiting  . Amoxicillin Hives    History of Present Illness    [name] is a 77 y.o. female with a hx of  non-Hodgkin's lymphoma treated with RCHOP methotrexate and XRT, Chron's disease, MS, HLD, memory loss, CAC last seen 02/03/19 by Dr. Bettina Gavia.  She receives her oncology care at [facility] and is appreciated with active care.  She  enjoys spending time with her young grandchildren.  She still works 2 days/week.  Echo 12/29/2018 with evidence of mild cardiomyopathy but no heart failure.  Her EF was 45 to 50%, impaired LV relaxation, LV diffuse hypokinesis.  Stress test 12/29/2018 low risk study with no evidence of ischemia or previous infarction.  We discussed that chemotherapy and cancer has strong effect on the heart.  We discussed a beta-blocker and the mechanism of helping the heart to relax to protect the cardiac function.  She is agreeable to trial this.  She brings a log of her blood pressure and heart rates at home.  Checks regularly with an arm cuff.  Heart rate 85-100.  Blood pressure 105-130/60-80.  Average blood pressure around 120/76.  She denies chest pain, pressure, tightness.  Denies S OB, DOE.  She reports no edema.  She reports no dizziness nor lightheadedness.  EKGs/Labs/Other Studies Reviewed:   The following studies were reviewed today: Echo 12/29/2018: IMPRESSIONS  1. The left ventricle has mildly reduced systolic function, with an ejection fraction of 45-50%. The cavity size was normal. Left ventricular diastolic Doppler parameters are consistent with impaired relaxation. Left ventricular diffuse hypokinesis.  2. The right ventricle has normal systolic function.  The cavity was normal. There is no increase in right ventricular wall thickness.  3. There is mild mitral annular calcification present.  4. The aortic valve is tricuspid. Moderate thickening of the aortic valve. Moderate calcification of the aortic valve. Aortic valve regurgitation is mild by color flow Doppler.  5. The aorta is normal unless otherwise noted.  6. The average left ventricular global longitudinal strain is -15.3 %.   MPI 12/16/2018: Study Highlights  Nuclear stress EF: 46%. The left ventricular ejection fraction is mildly decreased (45-54%).  There was no ST segment deviation noted during stress.  This is a low risk study.  There is no ischemia or evidence of previous infarction. The EF is mildly depressed.      Carotid duplex 08/10/2017: Final Interpretation: Right Carotid: Velocities in the right ICA are consistent with a 1-39% stenosis. Left Carotid: Velocities in the left ICA are consistent with a 1-39% stenosis. Vertebrals:  Bilateral vertebral arteries demonstrate antegrade flow. Subclavians: Normal flow hemodynamics were seen in bilateral subclavian              arteries.   PET at [facility] 12/06/2018 with severe diffuse coronary artery calcification  EKG:  No EKG today.  Recent Labs: 11/29/2018: ALT 19; BUN 24; Creatinine 1.0; Hemoglobin 13.3; Platelets 136; Potassium 3.4; Sodium 139 12/13/2018: TSH 1.57  Recent Lipid Panel    Component Value Date/Time   CHOL 114 12/13/2018 0857   TRIG 368.0 (H) 12/13/2018 0857   HDL 36.20 (L) 12/13/2018 0857   CHOLHDL 3 12/13/2018 0857   VLDL 73.6 (H) 12/13/2018 0857   LDLCALC 24 11/23/2017 0947   LDLDIRECT 37.0 12/13/2018 0857    Home Medications   Current Meds  Medication Sig  . Ascorbic Acid (VITAMIN C) 1000 MG tablet Take 1,000 mg by mouth daily.  [name] Kitchen b complex vitamins tablet Take 1 tablet by mouth daily.  . Cholecalciferol (VITAMIN D3) 5000 units TABS Take 5,000 Units by mouth daily.   . cyanocobalamin (,VITAMIN B-12,) 1000 MCG/ML injection Inject 5m IM every 7 days x 4 doses then inject 164mIM every month  . desonide (DESOWEN) 0.05 % lotion Apply topically 2 (two) times daily. Use as needed for itchy ears (Patient taking differently: Apply topically daily. Use as needed for itchy ears)  . fenofibrate (TRICOR) 145 MG tablet Take 1 tablet (145 mg total) by mouth daily.  . [name] Kitchenabapentin (NEURONTIN) 600 MG tablet Take 1 tablet (600 mg total) by mouth 3 (three) times daily.  . Glucosamine 500 MG TABS Take 1 tablet by mouth 2 (two) times daily.   . [name] Kitchenoratadine (CLARITIN) 10 MG tablet Take 1 tablet (10 mg total) by mouth daily as needed for allergies.  . [name] Kitchenlovastatin (MEVACOR) 40 MG tablet Take 1 tablet (40 mg total) by mouth 2 (two) times daily.  . memantine (NAMENDA) 10 MG tablet TAKE 1 TABLET BY MOUTH TWICE A DAY  . Multiple Vitamin (MULTIVITAMIN) tablet Take 1 tablet by mouth daily.   . Probiotic Product (ALIGN PO) Take 1 tablet by mouth daily.   . Syringe/Needle, Disp, (SYRINGE 3CC/25GX1") 25G X 1" 3 ML MISC Use with B12 injections      Review of Systems       Review of Systems  Constitution: Negative for chills, fever and malaise/fatigue.  Cardiovascular: Negative for chest pain, dyspnea on exertion, irregular heartbeat, leg swelling, near-syncope and orthopnea.  Respiratory: Negative for cough, shortness of breath and wheezing.   Gastrointestinal: Negative for nausea and vomiting.  Neurological: Negative for dizziness, light-headedness and weakness.   All other systems reviewed and are otherwise negative except as noted above.  Physical Exam    VS:  BP 134/74 (BP Location: Right Arm, Patient Position: Sitting, Cuff Size: Normal)   Pulse 85   Ht 5' 5.5" (1.664 m)   Wt 124 lb (56.2 kg)   SpO2 99%   BMI 20.32 kg/m  , BMI Body mass index is 20.32 kg/m. GEN: Well nourished, well developed, in no acute distress. HEENT: normal. Neck: Supple, no JVD, carotid bruits, or masses. Cardiac: RRR, no murmurs, rubs, or gallops. No clubbing, cyanosis, edema.  Radials/DP/PT 2+ and equal bilaterally.  Respiratory:  Respirations regular and unlabored, clear to auscultation bilaterally. GI: Soft, nontender, nondistended, BS + x 4. MS: No deformity or atrophy. Skin: Warm and dry, no rash. Neuro:  Strength and sensation are intact. Psych: Normal affect.  Assessment & Plan    1. Dilated cardiomyopathy -Echo 12/29/2018 with mild cardiomyopathy EF 45 to 50%.  Likely secondary to chemotherapy.  Brings log today of her blood pressure, heart rate.  Blood pressure average 120/76, heart rate 80-100 BPM.  She denies dizziness, lightheadedness, near  syncope.  She has no systolic blood pressures less than 100. There is note of starting beta blocker at previous office visit, but was not ordered. Start metoprolol succinate 12.5 mg daily. Discussed the mechanism of beta blocker for protection of heart function in mild cardiomyopathy. Repeat echo in 6 months.  If repeat EF severely reduced would benefit from aggressive treatment including Entresto, diuretic, MRA but would not initiate at this time per Dr. Joya Gaskins recommendations.  2. Coronary artery calcification -Lexiscan 12/2018 with no evidence of ischemia. No anginal symptoms. No indication for further ischemic evaluation at this time.  Continue lipid-lowering therapy.  Beta-blocker added today, as above.  Continue low-salt, heart healthy diet.  Continue regular cardiovascular exercise.  3. Dyslipidemia -follows with her PCP.  Continue lovastatin 40 mg daily.  Disposition: MyChart message in 2 weeks was for the report of blood pressure, heart rate after initiation of metoprolol.  Follow up in 6 week(s) with Dr. Bettina Gavia.  Echocardiogram to be completed prior to office visit.  Loel Dubonnet, NP 03/02/2019, 12:08 PM

## 2019-03-02 NOTE — Patient Instructions (Signed)
Medication Instructions:  Your physician has recommended you make the following change in your medication:  START Toprol XL 12.5 mg (half tablet) daily  *If you need a refill on your cardiac medications before your next appointment, please call your pharmacy*  Lab Work: No lab work today.  If you have labs (blood work) drawn today and your tests are completely normal, you will receive your results only by: [name] Kitchen MyChart Message (if you have MyChart) OR . A paper copy in the mail If you have any lab test that is abnormal or we need to change your treatment, we will call you to review the results.  Testing/Procedures: None today.  Your physician has requested that you have an echocardiogram. Echocardiography is a painless test that uses sound waves to create images of your heart. It provides your doctor with information about the size and shape of your heart and how well your heart's chambers and valves are working. This procedure takes approximately one hour. There are no restrictions for this procedure.  We will plan to do your echo in six months and schedule your appointment with Dr. Bettina Gavia the next week.  Follow-Up: At [facility], you and your health needs are our priority.  As part of our continuing mission to provide you with exceptional heart care, we have created designated Provider Care Teams.  These Care Teams include your primary Cardiologist (physician) and Advanced Practice Providers (APPs -  Physician Assistants and Nurse Practitioners) who all work together to provide you with the care you need, when you need it.  Your next appointment:   6 month(s)  The format for your next appointment:   In Person  Provider:   Shirlee More, MD  Other Instructions Metoprolol extended-release tablets What is this medicine? METOPROLOL (me TOE proe lole) is a beta-blocker. Beta-blockers reduce the workload on the heart and help it to beat more regularly. This medicine is used to treat  high blood pressure and to prevent chest pain. It is also used to after a heart attack and to prevent an additional heart attack from occurring. This medicine may be used for other purposes; ask your health care provider or pharmacist if you have questions. COMMON BRAND NAME(S): toprol, Toprol XL What should I tell my health care provider before I take this medicine? They need to know if you have any of these conditions:  diabetes  heart or vessel disease like slow heart rate, worsening heart failure, heart block, sick sinus syndrome or Raynaud's disease  kidney disease  liver disease  lung or breathing disease, like asthma or emphysema  pheochromocytoma  thyroid disease  an unusual or allergic reaction to metoprolol, other beta-blockers, medicines, foods, dyes, or preservatives  pregnant or trying to get pregnant  breast-feeding How should I use this medicine? Take this medicine by mouth with a glass of water. Follow the directions on the prescription label. Do not crush or chew. Take this medicine with or immediately after meals. Take your doses at regular intervals. Do not take more medicine than directed. Do not stop taking this medicine suddenly. This could lead to serious heart-related effects. Talk to your pediatrician regarding the use of this medicine in children. While this drug may be prescribed for children as young as 6 years for selected conditions, precautions do apply. Overdosage: If you think you have taken too much of this medicine contact a poison control center or emergency room at once. NOTE: This medicine is only for you. Do not share  this medicine with others. What if I miss a dose? If you miss a dose, take it as soon as you can. If it is almost time for your next dose, take only that dose. Do not take double or extra doses. What may interact with this medicine? This medicine may interact with the following medications:  certain medicines for blood pressure,  heart disease, irregular heart beat  certain medicines for depression, like monoamine oxidase (MAO) inhibitors, fluoxetine, or paroxetine  clonidine  dobutamine  epinephrine  isoproterenol  reserpine This list may not describe all possible interactions. Give your health care provider a list of all the medicines, herbs, non-prescription drugs, or dietary supplements you use. Also tell them if you smoke, drink alcohol, or use illegal drugs. Some items may interact with your medicine. What should I watch for while using this medicine? Visit your doctor or health care professional for regular check ups. Contact your doctor right away if your symptoms worsen. Check your blood pressure and pulse rate regularly. Ask your health care professional what your blood pressure and pulse rate should be, and when you should contact them. You may get drowsy or dizzy. Do not drive, use machinery, or do anything that needs mental alertness until you know how this medicine affects you. Do not sit or stand up quickly, especially if you are an older patient. This reduces the risk of dizzy or fainting spells. Contact your doctor if these symptoms continue. Alcohol may interfere with the effect of this medicine. Avoid alcoholic drinks. This medicine may increase blood sugar. Ask your healthcare provider if changes in diet or medicines are needed if you have diabetes. What side effects may I notice from receiving this medicine? Side effects that you should report to your doctor or health care professional as soon as possible:  allergic reactions like skin rash, itching or hives  cold or numb hands or feet  depression  difficulty breathing  faint  fever with sore throat  irregular heartbeat, chest pain  rapid weight gain   signs and symptoms of high blood sugar such as being more thirsty or hungry or having to urinate more than normal. You may also feel very tired or have blurry vision.  swollen legs  or ankles Side effects that usually do not require medical attention (report to your doctor or health care professional if they continue or are bothersome):  anxiety or nervousness  change in sex drive or performance  dry skin  headache  nightmares or trouble sleeping  short term memory loss  stomach upset or diarrhea This list may not describe all possible side effects. Call your doctor for medical advice about side effects. You may report side effects to FDA at 1-800-FDA-1088. Where should I keep my medicine? Keep out of the reach of children. Store at room temperature between 15 and 30 degrees C (59 and 86 degrees F). Throw away any unused medicine after the expiration date. NOTE: This sheet is a summary. It may not cover all possible information. If you have questions about this medicine, talk to your doctor, pharmacist, or health care provider.  2020 Elsevier/Gold Standard (2018-01-19 11:09:41)

## 2019-03-03 ENCOUNTER — Ambulatory Visit: Payer: Medicare HMO | Admitting: Family

## 2019-03-08 ENCOUNTER — Encounter: Payer: Self-pay | Admitting: Gastroenterology

## 2019-03-15 ENCOUNTER — Encounter: Payer: Self-pay | Admitting: Family

## 2019-03-16 NOTE — Telephone Encounter (Signed)
Called patient as she had not read MyChart message yet. Spoke with her husband who says he believes she is doing well. He took down our phone number and said he will ask her to call us if she has any concerns.   Loel Dubonnet, NP

## 2019-03-17 ENCOUNTER — Telehealth: Payer: Self-pay | Admitting: *Deleted

## 2019-03-17 NOTE — Telephone Encounter (Signed)
Called pt because we received a fax from [place] one to one that current PA on file is set to expire. Per phone note from 01/06/19 with Dr. Jannifer Franklin, he instructed pt to stop d/t SE. Pt verified she stopped Aubagio. She is still having bathroom issues and have lower GI scope next week on 03/23/19. She does not want to go on Gilenya d/t her hx kidney cancer. [facility] MD advised against this. She does not want to try Mayzent or Zeposia d/t potential SE she read about. She will discuss going on copaxone at [place] with SS,NP 04/20/19 at 8:15am. She will call back if any further questions/concerns.

## 2019-03-21 ENCOUNTER — Other Ambulatory Visit: Payer: Self-pay | Admitting: Gastroenterology

## 2019-03-21 ENCOUNTER — Ambulatory Visit (INDEPENDENT_AMBULATORY_CARE_PROVIDER_SITE_OTHER): Payer: Medicare HMO

## 2019-03-21 DIAGNOSIS — Z1159 Encounter for screening for other viral diseases: Secondary | ICD-10-CM

## 2019-03-22 ENCOUNTER — Encounter: Payer: Self-pay | Admitting: Gastroenterology

## 2019-03-22 LAB — SARS CORONAVIRUS 2 (TAT 6-24 HRS): SARS Coronavirus 2: NEGATIVE

## 2019-03-23 ENCOUNTER — Encounter: Payer: Self-pay | Admitting: Gastroenterology

## 2019-03-23 ENCOUNTER — Other Ambulatory Visit: Payer: Self-pay

## 2019-03-23 ENCOUNTER — Ambulatory Visit (AMBULATORY_SURGERY_CENTER): Payer: Medicare HMO | Admitting: Gastroenterology

## 2019-03-23 VITALS — BP 114/63 | HR 73 | Temp 97.9°F | Resp 13 | Ht 65.5 in | Wt 123.0 lb

## 2019-03-23 DIAGNOSIS — K5730 Diverticulosis of large intestine without perforation or abscess without bleeding: Secondary | ICD-10-CM

## 2019-03-23 DIAGNOSIS — K5000 Crohn's disease of small intestine without complications: Secondary | ICD-10-CM | POA: Diagnosis not present

## 2019-03-23 DIAGNOSIS — K648 Other hemorrhoids: Secondary | ICD-10-CM

## 2019-03-23 DIAGNOSIS — K50919 Crohn's disease, unspecified, with unspecified complications: Secondary | ICD-10-CM

## 2019-03-23 DIAGNOSIS — Z8719 Personal history of other diseases of the digestive system: Secondary | ICD-10-CM

## 2019-03-23 DIAGNOSIS — R69 Illness, unspecified: Secondary | ICD-10-CM | POA: Diagnosis not present

## 2019-03-23 DIAGNOSIS — K5090 Crohn's disease, unspecified, without complications: Secondary | ICD-10-CM | POA: Diagnosis not present

## 2019-03-23 DIAGNOSIS — K5520 Angiodysplasia of colon without hemorrhage: Secondary | ICD-10-CM

## 2019-03-23 DIAGNOSIS — K529 Noninfective gastroenteritis and colitis, unspecified: Secondary | ICD-10-CM | POA: Diagnosis not present

## 2019-03-23 MED ORDER — BUDESONIDE 3 MG PO CPEP: 9.0000 mg | ORAL_CAPSULE | Freq: Every day | ORAL | 0 refills | Status: DC

## 2019-03-23 MED ORDER — SODIUM CHLORIDE 0.9 % IV SOLN: 500.0000 mL | Freq: Once | INTRAVENOUS | Status: DC

## 2019-03-23 NOTE — Progress Notes (Signed)
VS-DT Temp JB

## 2019-03-23 NOTE — Progress Notes (Signed)
PT taken to PACU. Monitors in place. VSS. Report given to RN. 

## 2019-03-23 NOTE — Op Note (Signed)
[place] Patient Name: [name] Procedure Date: 03/23/2019 8:34 AM MRN: [id number] Endoscopist: Mauri Pole , MD Age: 78 Referring MD:  Date of Birth: 01-May-1940 Gender: Female Account #: [id number] Procedure:                Colonoscopy Indications:              Clinically significant diarrhea of unexplained                            origin Medicines:                Monitored Anesthesia Care Procedure:                Pre-Anesthesia Assessment:                           - Prior to the procedure, a History and Physical                            was performed, and patient medications and                            allergies were reviewed. The patient's tolerance of                            previous anesthesia was also reviewed. The risks                            and benefits of the procedure and the sedation                            options and risks were discussed with the patient.                            All questions were answered, and informed consent                            was obtained. Prior Anticoagulants: The patient has                            taken no previous anticoagulant or antiplatelet                            agents. ASA Grade Assessment: III - A patient with                            severe systemic disease. After reviewing the risks                            and benefits, the patient was deemed in                            satisfactory condition to undergo the procedure.  After obtaining informed consent, the colonoscope                            was passed under direct vision. Throughout the                            procedure, the patient's blood pressure, pulse, and                            oxygen saturations were monitored continuously. The                            Colonoscope was introduced through the anus and                            advanced to the the terminal ileum, with                     identification of the appendiceal orifice and IC                            valve. The quality of the bowel preparation was                            excellent. The terminal ileum, ileocecal valve,                            appendiceal orifice, and rectum were photographed. Scope In: 8:40:27 AM Scope Out: 9:01:03 AM Scope Withdrawal Time: 0 hours 15 minutes 31 seconds  Total Procedure Duration: 0 hours 20 minutes 36 seconds  Findings:                 The perianal and digital rectal examinations were                            normal.                           Diffuse inflammation, mild in severity and                            characterized by congestion (edema), erythema and                            granularity was found in the terminal ileum.                            Biopsies were taken with a cold forceps for                            histology.                           The terminal ileum contained a benign-appearing,  intrinsic moderate stenosis that was non-traversed.                           Inflammation characterized by adherent blood,                            altered vascularity, congestion (edema), erythema                            and friability was found as small patches                            surrounded by normal mucosa in the ascending colon                            and at the cecum. The sigmoid colon, the descending                            colon and the transverse colon were spared. This                            was mild in severity. Biopsies were taken with a                            cold forceps for histology.                           Multiple small and large-mouthed diverticula were                            found in the sigmoid colon, descending colon,                            transverse colon and ascending colon.                           Non-bleeding internal hemorrhoids were found during                             retroflexion. The hemorrhoids were small. Complications:            No immediate complications. Estimated Blood Loss:     Estimated blood loss was minimal. Impression:               - Crohn's disease. Biopsied.                           - Stricture in the terminal ileum.                           - Inflammatory bowel disease. Inflammation was                            found in the ascending colon and at the cecum. This  was mild in severity. Biopsied.                           - Severe diverticulosis in the sigmoid colon, in                            the descending colon, in the transverse colon and                            in the ascending colon.                           - Non-bleeding internal hemorrhoids. Recommendation:           - Patient has a contact number available for                            emergencies. The signs and symptoms of potential                            delayed complications were discussed with the                            patient. Return to normal activities tomorrow.                            Written discharge instructions were provided to the                            patient.                           - Resume previous diet.                           - Continue present medications.                           - Await pathology results.                           - Repeat colonoscopy in 3 years for surveillance                            based on pathology results.                           - Use Entocort EC (budesonide) 9 mg PO one time per                            day daily X90 tablets                           - Return to GI office in 1 month at next available  apointment. Mauri Pole, MD 03/23/2019 9:11:12 AM This report has been signed electronically.

## 2019-03-23 NOTE — Progress Notes (Signed)
Called to room to assist during endoscopic procedure.  Patient ID and intended procedure confirmed with present staff. Received instructions for my participation in the procedure from the performing physician.  

## 2019-03-23 NOTE — Patient Instructions (Signed)
Handout on diverticulosis given. Pick up new medication today.   YOU HAD AN ENDOSCOPIC PROCEDURE TODAY AT [facility]:   Refer to the procedure report that was given to you for any specific questions about what was found during the examination.  If the procedure report does not answer your questions, please call your gastroenterologist to clarify.  If you requested that your care partner not be given the details of your procedure findings, then the procedure report has been included in a sealed envelope for you to review at your convenience later.  YOU SHOULD EXPECT: Some feelings of bloating in the abdomen. Passage of more gas than usual.  Walking can help get rid of the air that was put into your GI tract during the procedure and reduce the bloating. If you had a lower endoscopy (such as a colonoscopy or flexible sigmoidoscopy) you may notice spotting of blood in your stool or on the toilet paper. If you underwent a bowel prep for your procedure, you may not have a normal bowel movement for a few days.  Please Note:  You might notice some irritation and congestion in your nose or some drainage.  This is from the oxygen used during your procedure.  There is no need for concern and it should clear up in a day or so.  SYMPTOMS TO REPORT IMMEDIATELY:   Following lower endoscopy (colonoscopy or flexible sigmoidoscopy):  Excessive amounts of blood in the stool  Significant tenderness or worsening of abdominal pains  Swelling of the abdomen that is new, acute  Fever of 100F or higher   For urgent or emergent issues, a gastroenterologist can be reached at any hour by calling [phone].   DIET:  We do recommend a small meal at first, but then you may proceed to your regular diet.  Drink plenty of fluids but you should avoid alcoholic beverages for 24 hours.  ACTIVITY:  You should plan to take it easy for the rest of today and you should NOT DRIVE or use heavy machinery until  tomorrow (because of the sedation medicines used during the test).    FOLLOW UP: Our staff will call the number listed on your records 48-72 hours following your procedure to check on you and address any questions or concerns that you may have regarding the information given to you following your procedure. If we do not reach you, we will leave a message.  We will attempt to reach you two times.  During this call, we will ask if you have developed any symptoms of COVID 19. If you develop any symptoms (ie: fever, flu-like symptoms, shortness of breath, cough etc.) before then, please call [phone].  If you test positive for Covid 19 in the 2 weeks post procedure, please call and report this information to [place].    If any biopsies were taken you will be contacted by phone or by letter within the next 1-3 weeks.  Please call us at [phone] if you have not heard about the biopsies in 3 weeks.    SIGNATURES/CONFIDENTIALITY: You and/or your care partner have signed paperwork which will be entered into your electronic medical record.  These signatures attest to the fact that that the information above on your After Visit Summary has been reviewed and is understood.  Full responsibility of the confidentiality of this discharge information lies with you and/or your care-partner.

## 2019-03-24 ENCOUNTER — Other Ambulatory Visit: Payer: Self-pay

## 2019-03-25 ENCOUNTER — Telehealth: Payer: Self-pay

## 2019-03-25 ENCOUNTER — Encounter: Payer: Self-pay | Admitting: Gastroenterology

## 2019-03-25 NOTE — Telephone Encounter (Signed)
Covid-19 screening questions   Do you now or have you had a fever in the last 14 days? No.  Do you have any respiratory symptoms of shortness of breath or cough now or in the last 14 days? No. Do you have any family members or close contacts with diagnosed or suspected Covid-19 in the past 14 days? No.  Have you been tested for Covid-19 and found to be positive? No.       Follow up Call-  Call back number 03/23/2019  Post procedure Call Back phone  # [phone]  Permission to leave phone message Yes  Some recent data might be hidden     Patient questions:  Do you have a fever, pain , or abdominal swelling? No. Pain Score  0 *  Have you tolerated food without any problems? Yes.    Have you been able to return to your normal activities? Yes.    Do you have any questions about your discharge instructions: Diet   No. Medications  No. Follow up visit  No.  Do you have questions or concerns about your Care? No.  Spoke with pt.'s husband Charlie.  Pt. Outside walking.  Actions: * If pain score is 4 or above: No action needed, pain <4.

## 2019-03-28 ENCOUNTER — Ambulatory Visit: Payer: Medicare HMO | Admitting: Gastroenterology

## 2019-04-12 DIAGNOSIS — L720 Epidermal cyst: Secondary | ICD-10-CM | POA: Diagnosis not present

## 2019-04-13 ENCOUNTER — Telehealth: Payer: Self-pay

## 2019-04-13 NOTE — Telephone Encounter (Signed)
Provider NP Butler Denmark requested the pt to r/s because she will be out of office for a Doctors appt.   Called pt no answer. Left a VM. Went a head and canceled the appt,  If pt calls back please r/s them.

## 2019-04-14 ENCOUNTER — Other Ambulatory Visit: Payer: Self-pay | Admitting: Gastroenterology

## 2019-04-14 DIAGNOSIS — K50919 Crohn's disease, unspecified, with unspecified complications: Secondary | ICD-10-CM

## 2019-04-16 DIAGNOSIS — R69 Illness, unspecified: Secondary | ICD-10-CM | POA: Diagnosis not present

## 2019-04-19 NOTE — Progress Notes (Signed)
PATIENT: [name] DOB: 03-08-1941  REASON FOR VISIT: follow up HISTORY FROM: patient  HISTORY OF PRESENT ILLNESS: Today 04/20/19  [name] is a 79 year old female with history of multiple sclerosis.  Last year she was treated for lymphoma, she received rituximab, was taken off Gilenya.  She was started on Aubagio, but she could not tolerate the medication.  Dr. Jannifer Franklin suggested she either go back on Gilenya or try Mayzent.  She has history of Crohn's disease.  She had MRI of the brain in May 2019, no enhancing lesions were noted, overall, no significant change compared with prior MRI in August 2017.  She has been off Aubagio for about a month.  She says she had diarrhea as result, but she also has Crohn's disease.  She reports her MS is stable, she denies any new problems or concerns.  She says on a daily basis she really does not even notice she has MS.  She remains on gabapentin 3 times daily for muscle stiffness.  She reports her memory is stable.  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  She denies any falls.  She was considering starting Copaxone.  At this point, she wonders if she could remain off MS medications.  She presents today for evaluation unaccompanied.  HISTORY 10/14/2018 Dr. Jannifer Franklin: [name] is a 79 year old left-handed white female with a history of multiple sclerosis.  The patient was just treated for lymphoma, she received rituximab, and she was taken off Gilenya around this time.  She is now on no disease modifying agents.  She has not required any repeat doses of rituximab.  She has been noted to have an irregular heartbeat at times, she is being followed for this.  She just recently had left cataract surgery.  She has not noted any new numbness or weakness of the face, arms, legs.  She does have some decreased urine stream but no urgency or incontinence, she has Crohn's disease and may have occasional incontinence of the bowels,  she has had prior hemorrhoid surgery.  She has had some mild problems with the balance, she has not been doing her yoga regularly recently.  She is walking on a regular basis.  She does have some underlying fatigue and she reports some mild memory issues.  She has not had any visual changes.  She returns to this office for an evaluation.  She has not had any blood work done since December 2019.  REVIEW OF SYSTEMS: Out of a complete 14 system review of symptoms, the patient complains only of the following symptoms, and all other reviewed systems are negative.  Muscle stiffness  ALLERGIES: Allergies  Allergen Reactions  . Sulfa Antibiotics Hives and Nausea And Vomiting  . Vytorin [Ezetimibe-Simvastatin]     White skin patch  . Cephalexin   . Doxycycline     Raw mouth   . Erythromycin     REACTION: weakness  . Macrobid [Nitrofurantoin Macrocrystal] Hives  . Morphine Nausea And Vomiting  . Sulfonamide Derivatives Hives and Nausea And Vomiting  . Amoxicillin Hives    HOME MEDICATIONS: Outpatient Medications Prior to Visit  Medication Sig Dispense Refill  . Ascorbic Acid (VITAMIN C) 1000 MG tablet Take 1,000 mg by mouth daily.    [name] Kitchen b complex vitamins tablet Take 1 tablet by mouth daily.    . budesonide (ENTOCORT EC) 3 MG 24 hr capsule TAKE 3 CAPSULES (9 MG TOTAL) BY  MOUTH DAILY. TAKE 9MG BY MOUTH DAILY FOR 90 DAYS. 90 capsule 0  . Cholecalciferol (VITAMIN D3) 5000 units TABS Take 5,000 Units by mouth daily.     . cyanocobalamin (,VITAMIN B-12,) 1000 MCG/ML injection Inject 73m IM every 7 days x 4 doses then inject 158mIM every month 4 mL 11  . desonide (DESOWEN) 0.05 % lotion Apply topically 2 (two) times daily. Use as needed for itchy ears (Patient taking differently: Apply topically daily. Use as needed for itchy ears) 59 mL 1  . fenofibrate (TRICOR) 145 MG tablet Take 1 tablet (145 mg total) by mouth daily. 90 tablet 3  . Glucosamine 500 MG TABS Take 1 tablet by mouth 2 (two) times  daily.     . [name] Kitchenoratadine (CLARITIN) 10 MG tablet Take 1 tablet (10 mg total) by mouth daily as needed for allergies.    . [name] Kitchenovastatin (MEVACOR) 40 MG tablet Take 1 tablet (40 mg total) by mouth 2 (two) times daily. 180 tablet 3  . memantine (NAMENDA) 10 MG tablet TAKE 1 TABLET BY MOUTH TWICE A DAY 180 tablet 2  . metoprolol succinate (TOPROL-XL) 25 MG 24 hr tablet Take 0.5 tablets (12.5 mg total) by mouth daily. 45 tablet 1  . Multiple Vitamin (MULTIVITAMIN) tablet Take 1 tablet by mouth daily.     . Probiotic Product (ALIGN PO) Take 1 tablet by mouth daily.     . Syringe/Needle, Disp, (SYRINGE 3CC/25GX1") 25G X 1" 3 ML MISC Use with B12 injections 4 each 11  . Teriflunomide (AUBAGIO PO) Take by mouth. States Dr.Willis put her on it.    . valACYclovir (VALTREX) 500 MG tablet Take 500 mg by mouth daily.    . [name] Kitchenabapentin (NEURONTIN) 600 MG tablet Take 1 tablet (600 mg total) by mouth 3 (three) times daily. 270 tablet 3   No facility-administered medications prior to visit.    PAST MEDICAL HISTORY: Past Medical History:  Diagnosis Date  . Allergy   . Arthritis, degenerative    both knees  . B12 deficiency 07/06/2014  . Cancer ([facility]  . Colitis   . Diverticulosis   . Duodenitis   . Fatty liver   . Gastritis   . GERD (gastroesophageal reflux disease)   . Heart murmur   . Helicobacter pylori gastritis   . Hyperlipidemia   . Hypertension    not treated, pt reports "no"  . IBD (inflammatory bowel disease)   . [place]oma ([facility]  . Memory difficulty 01/05/2013  . Memory loss    mild  . Multiple sclerosis ([facility]  . Osteopenia   . Vitamin B12 deficiency     PAST SURGICAL HISTORY: Past Surgical History:  Procedure Laterality Date  . ABDOMINAL HYSTERECTOMY  2010  . BLADDER SURGERY  2010   Bladder Tack  . CERVICAL SPINE SURGERY    . CHOLECYSTECTOMY    . HEMORRHOID SURGERY    . ROTATOR CUFF REPAIR    . SPINAL FUSION    . TUBAL LIGATION      FAMILY HISTORY: Family History  Problem  Relation Age of Onset  . Diabetes Father   . Heart disease Father   . Colon polyps Mother   . Breast cancer Cousin   . Colon cancer Maternal Grandmother        ? may be duodenal cancer  . Colon polyps Brother   . Hypertension Brother   . Pancreatic cancer Maternal Uncle   . Esophageal cancer Neg Hx   .  Rectal cancer Neg Hx   . Stomach cancer Neg Hx     SOCIAL HISTORY: Social History   Socioeconomic History  . Marital status: Married    Spouse name: Eduard Clos   . Number of children: 4  . Years of education: 12+  . Highest education level: Not on file  Occupational History  . Occupation: Retired     Fish farm manager: Pharmacist, community AND [employer]   Tobacco Use  . Smoking status: Former Smoker    Types: Cigarettes    Quit date: 06/27/1997    Years since quitting: 21.8  . Smokeless tobacco: Never Used  Substance and Sexual Activity  . Alcohol use: No  . Drug use: No  . Sexual activity: Not Currently  Other Topics Concern  . Not on file  Social History Narrative   Caffeine daily.    Patient lives at home with husband Eduard Clos but goes by [employer].    Patient has 4 children.    Patient has 1 year of college.    Patient is retired.    Patient is left handed.    Patient drinks 2 cups of coffee per day.   Social Determinants of Health   Financial Resource Strain:   . Difficulty of Paying Living Expenses: Not on file  Food Insecurity:   . Worried About Charity fundraiser in the Last Year: Not on file  . Ran Out of Food in the Last Year: Not on file  Transportation Needs:   . Lack of Transportation (Medical): Not on file  . Lack of Transportation (Non-Medical): Not on file  Physical Activity:   . Days of Exercise per Week: Not on file  . Minutes of Exercise per Session: Not on file  Stress:   . Feeling of Stress : Not on file  Social Connections:   . Frequency of Communication with Friends and Family: Not on file  . Frequency of Social Gatherings with Friends and  Family: Not on file  . Attends Religious Services: Not on file  . Active Member of Clubs or Organizations: Not on file  . Attends Archivist Meetings: Not on file  . Marital Status: Not on file  Intimate Partner Violence:   . Fear of Current or Ex-Partner: Not on file  . Emotionally Abused: Not on file  . Physically Abused: Not on file  . Sexually Abused: Not on file    PHYSICAL EXAM  Vitals:   04/20/19 1235  BP: 130/79  Pulse: 75  Temp: (!) 97 F (36.1 C)  Weight: 124 lb 3.2 oz (56.3 kg)  Height: 5' 5"  (1.651 m)   Body mass index is 20.67 kg/m.  Generalized: Well developed, in no acute distress   Neurological examination  Mentation: Alert oriented to time, place, history taking. Follows all commands speech and language fluent Cranial nerve II-XII: Pupils were equal round reactive to light. Extraocular movements were full, visual field were full on confrontational test. Facial sensation and strength were normal. Head turning and shoulder shrug  were normal and symmetric. Motor: The motor testing reveals 5 over 5 strength of all 4 extremities. Good symmetric motor tone is noted throughout.  Strong grips bilaterally. Sensory: Sensory testing is intact to soft touch on all 4 extremities. No evidence of extinction is noted.  Coordination: Cerebellar testing reveals good finger-nose-finger and heel-to-shin bilaterally.  Gait and station: Gait is normal. Tandem gait is normal. Romberg is negative. No drift is seen.  Reflexes: Deep tendon reflexes are symmetric  and normal bilaterally.   DIAGNOSTIC DATA (LABS, IMAGING, TESTING) - I reviewed patient records, labs, notes, testing and imaging myself where available.  Lab Results  Component Value Date   WBC 3.1 11/29/2018   HGB 13.3 11/29/2018   HCT 41 11/29/2018   MCV 98 (H) 10/14/2018   PLT 136 (A) 11/29/2018      Component Value Date/Time   NA 139 11/29/2018 0000   NA 141 05/06/2013 0926   K 3.4 11/29/2018 0000    K 3.9 05/06/2013 0926   CL 102 11/29/2018 0000   CO2 26 (A) 11/29/2018 0000   CO2 26 05/06/2013 0926   GLUCOSE 99 10/14/2018 0824   GLUCOSE 153 (H) 03/04/2018 1520   GLUCOSE 119 05/06/2013 0926   BUN 24 (A) 11/29/2018 0000   BUN 21.6 05/06/2013 0926   CREATININE 1.0 11/29/2018 0000   CREATININE 0.97 10/14/2018 0824   CREATININE 0.8 05/06/2013 0926   CALCIUM 9.1 11/29/2018 0000   CALCIUM 10.0 05/06/2013 0926   PROT 6.6 10/14/2018 0824   PROT 7.7 05/06/2013 0926   ALBUMIN 4.4 10/14/2018 0824   ALBUMIN 4.1 05/06/2013 0926   AST 31 11/29/2018 0000   AST 24 05/06/2013 0926   ALT 19 11/29/2018 0000   ALT 16 05/06/2013 0926   ALKPHOS 62 10/14/2018 0824   ALKPHOS 49 05/06/2013 0926   BILITOT 0.4 10/14/2018 0824   BILITOT 0.58 05/06/2013 0926   GFRNONAA 54 11/29/2018 0000   GFRAA 66 10/14/2018 0824   Lab Results  Component Value Date   CHOL 114 12/13/2018   HDL 36.20 (L) 12/13/2018   LDLCALC 24 11/23/2017   LDLDIRECT 37.0 12/13/2018   TRIG 368.0 (H) 12/13/2018   CHOLHDL 3 12/13/2018   Lab Results  Component Value Date   HGBA1C 6.0 12/13/2018   Lab Results  Component Value Date   VITAMINB12 151 (L) 02/23/2019   Lab Results  Component Value Date   TSH 1.57 12/13/2018    ASSESSMENT AND PLAN 79 y.o. year old female  has a past medical history of Allergy, Arthritis, degenerative, B12 deficiency (07/06/2014), Cancer ([place]), Colitis, Diverticulosis, Duodenitis, Fatty liver, Gastritis, GERD (gastroesophageal reflux disease), Heart murmur, Helicobacter pylori gastritis, Hyperlipidemia, Hypertension, IBD (inflammatory bowel disease), Lymphoma ([place]), Memory difficulty (01/05/2013), Memory loss, Multiple sclerosis ([place]), Osteopenia, and Vitamin B12 deficiency. here with:  1.  Multiple sclerosis 2.  History of lymphoma 3.  Reported mild memory disturbance  Last year she was treated for lymphoma, with rituximab.  At that time, Gilenya was stopped.  She was started on Aubagio, but had  diarrhea, but she also has Crohn's disease.  She stopped the Aubagio, and has been off medication for around 1 month.  She reports she has done very well with her MS, and wonders if it may be possible for her to remain off MS medications.  We did discuss considering Copaxone, we reviewed the drug pamphlet.  She is going to think about her options, and I will discuss with Dr. Jannifer Franklin, to see if she may be a candidate to remain off MS medications, today, I will check routine lab work, planning that we may initiate drug therapy.  I will refill her gabapentin, and she will remain on Namenda.  She will follow-up in 6 months or sooner if needed.  I did advise if her symptoms worsen or she develops any new symptoms she should let us know.  MRI of the Brain 08/20/2017 IMPRESSION: Abnormal MRI scan of the brain showing mild changes of  chronic microvascular ischemia and generalized cerebral atrophy.  There are incidental changes of chronic paranasal sinus inflammation with a large mucous retention cyst/polyp in the left maxillary antrum.  No enhancing lesions are noted.  Overall no significant change compared with previous MRI dated 11/19/2015   I spent 25 minutes with the patient. 50% of this time was spent discussing her plan of care.    Butler Denmark, AGNP-C, DNP 04/20/2019, 1:06 PM [facility] [address], [place], [place] [address] [phone]

## 2019-04-20 ENCOUNTER — Ambulatory Visit: Payer: Medicare HMO | Admitting: Neurology

## 2019-04-20 ENCOUNTER — Encounter: Payer: Self-pay | Admitting: Neurology

## 2019-04-20 ENCOUNTER — Other Ambulatory Visit: Payer: Self-pay

## 2019-04-20 VITALS — BP 130/79 | HR 75 | Temp 97.0°F | Ht 65.0 in | Wt 124.2 lb

## 2019-04-20 DIAGNOSIS — C8330 Diffuse large B-cell lymphoma, unspecified site: Secondary | ICD-10-CM

## 2019-04-20 DIAGNOSIS — G35 Multiple sclerosis: Secondary | ICD-10-CM

## 2019-04-20 MED ORDER — GABAPENTIN 600 MG PO TABS: 600.0000 mg | ORAL_TABLET | Freq: Three times a day (TID) | ORAL | 3 refills | Status: DC

## 2019-04-20 NOTE — Patient Instructions (Signed)
I will check lab work today, continue current medications, we will hold off on MS treatment, think about options and we will make a decision in the near future.

## 2019-04-21 ENCOUNTER — Telehealth: Payer: Self-pay | Admitting: Neurology

## 2019-04-21 LAB — COMPREHENSIVE METABOLIC PANEL
ALT: 15 IU/L (ref 0–32)
AST: 24 IU/L (ref 0–40)
Albumin/Globulin Ratio: 2 (ref 1.2–2.2)
Albumin: 4.1 g/dL (ref 3.7–4.7)
Alkaline Phosphatase: 54 IU/L (ref 39–117)
BUN/Creatinine Ratio: 31 — ABNORMAL HIGH (ref 12–28)
BUN: 33 mg/dL — ABNORMAL HIGH (ref 8–27)
Bilirubin Total: 0.4 mg/dL (ref 0.0–1.2)
CO2: 25 mmol/L (ref 20–29)
Calcium: 9.4 mg/dL (ref 8.7–10.3)
Chloride: 106 mmol/L (ref 96–106)
Creatinine, Ser: 1.05 mg/dL — ABNORMAL HIGH (ref 0.57–1.00)
GFR calc Af Amer: 59 mL/min/{1.73_m2} — ABNORMAL LOW (ref >59)
GFR calc non Af Amer: 51 mL/min/{1.73_m2} — ABNORMAL LOW (ref >59)
Globulin, Total: 2.1 g/dL (ref 1.5–4.5)
Glucose: 91 mg/dL (ref 65–99)
Potassium: 4.3 mmol/L (ref 3.5–5.2)
Sodium: 144 mmol/L (ref 134–144)
Total Protein: 6.2 g/dL (ref 6.0–8.5)

## 2019-04-21 LAB — CBC WITH DIFFERENTIAL/PLATELET
Basophils Absolute: 0 10*3/uL (ref 0.0–0.2)
Basos: 1 %
EOS (ABSOLUTE): 0 10*3/uL (ref 0.0–0.4)
Eos: 1 %
Hematocrit: 38.8 % (ref 34.0–46.6)
Hemoglobin: 12.1 g/dL (ref 11.1–15.9)
Immature Grans (Abs): 0 10*3/uL (ref 0.0–0.1)
Immature Granulocytes: 1 %
Lymphocytes Absolute: 0.6 10*3/uL — ABNORMAL LOW (ref 0.7–3.1)
Lymphs: 19 %
MCH: 31.1 pg (ref 26.6–33.0)
MCHC: 31.2 g/dL — ABNORMAL LOW (ref 31.5–35.7)
MCV: 100 fL — ABNORMAL HIGH (ref 79–97)
Monocytes Absolute: 0.3 10*3/uL (ref 0.1–0.9)
Monocytes: 9 %
Neutrophils Absolute: 2.3 10*3/uL (ref 1.4–7.0)
Neutrophils: 69 %
Platelets: 209 10*3/uL (ref 150–450)
RBC: 3.89 x10E6/uL (ref 3.77–5.28)
RDW: 13.9 % (ref 11.7–15.4)
WBC: 3.4 10*3/uL (ref 3.4–10.8)

## 2019-04-21 NOTE — Telephone Encounter (Signed)
I called pt and she forgot to relay that she was having the last 2 wks or so more tingling then numnbess in her L arm relating to movement.  ? Being related to MS.  ? Going back on treatment Copaxone or other?? (had kidney CA 1 yr ago).  ? Do MRI to evaluate? Ok to call back next week.

## 2019-04-21 NOTE — Telephone Encounter (Signed)
Pt is calling to have NP Sarah aware of numbness and tingling in left arm for weeks.  Pt said she failed to mention this on her last visit.  Pt is asking for a call to discuss.

## 2019-04-23 NOTE — Progress Notes (Signed)
I have read the note, and I agree with the clinical assessment and plan.  Navaya Wiatrek K Graysin Luczynski   

## 2019-04-25 NOTE — Telephone Encounter (Signed)
I called the patient.  She reports that she forgot to mention that for the last maybe 3 months she has had general weakness in her legs with using steps, and some numbness and tingling to her left arm with movement.  Overall the symptoms have not impaired her function.  She has been off Aubagio for about a month.  I saw her in the office last week, she is wondering if she may remain off MS medications, if not, she is willing to retry Aubagio or try Copaxone. I will route to Dr. Jannifer Franklin for his opinion.  She reports her Crohn's is doing well, she has been started on Entocort (budesonide) daily.

## 2019-04-25 NOTE — Telephone Encounter (Signed)
Copaxone would offer a very low risk medical therapy for this patient.  Certainly as individuals get older, the need for ongoing MS treatment may lessen.  If she is having new symptoms that could be related to MS, this may be an indication to initiate treatment.

## 2019-04-25 NOTE — Telephone Encounter (Signed)
I called the patient.  I recommend she try Copaxone.  She reports she will discuss with her husband, may wait a few weeks to see how she feels.  She will let me know her final decision.

## 2019-04-27 ENCOUNTER — Ambulatory Visit: Payer: Medicare HMO | Admitting: Gastroenterology

## 2019-04-29 ENCOUNTER — Ambulatory Visit: Payer: Medicare Other | Attending: Internal Medicine

## 2019-04-29 DIAGNOSIS — Z23 Encounter for immunization: Secondary | ICD-10-CM

## 2019-04-29 NOTE — Progress Notes (Signed)
   Covid-19 Vaccination Clinic  Name:  [name]    MRN: [id number] DOB: [birth year]  04/29/2019  [name] was observed post Covid-19 immunization for 15 minutes without incidence. She was provided with Vaccine Information Sheet and instruction to access the V-Safe system.   [name] was instructed to call 911 with any severe reactions post vaccine: [name] Kitchen Difficulty breathing  . Swelling of your face and throat  . A fast heartbeat  . A bad rash all over your body  . Dizziness and weakness    Immunizations Administered    Name Date Dose VIS Date Route   Pfizer COVID-19 Vaccine 04/29/2019 11:44 AM 0.3 mL 03/25/2019 Intramuscular   Manufacturer: [place]   Lot: [id number]   [place]: [id number]

## 2019-05-08 ENCOUNTER — Other Ambulatory Visit: Payer: Self-pay | Admitting: Gastroenterology

## 2019-05-08 DIAGNOSIS — K50919 Crohn's disease, unspecified, with unspecified complications: Secondary | ICD-10-CM

## 2019-05-17 IMAGING — DX DG CERVICAL SPINE 2 OR 3 VIEWS
2 series · 2 of 2 positions shown · non-contrast
Comparison: Thoracic spine radiographs - earlier same day

CLINICAL DATA: Chronic neck pain. Some left arm pain and weakness.
No recent injury. No odontoid view per providers protocol.

EXAM:
CERVICAL SPINE - 2-3 VIEW

[cervical spine ap]
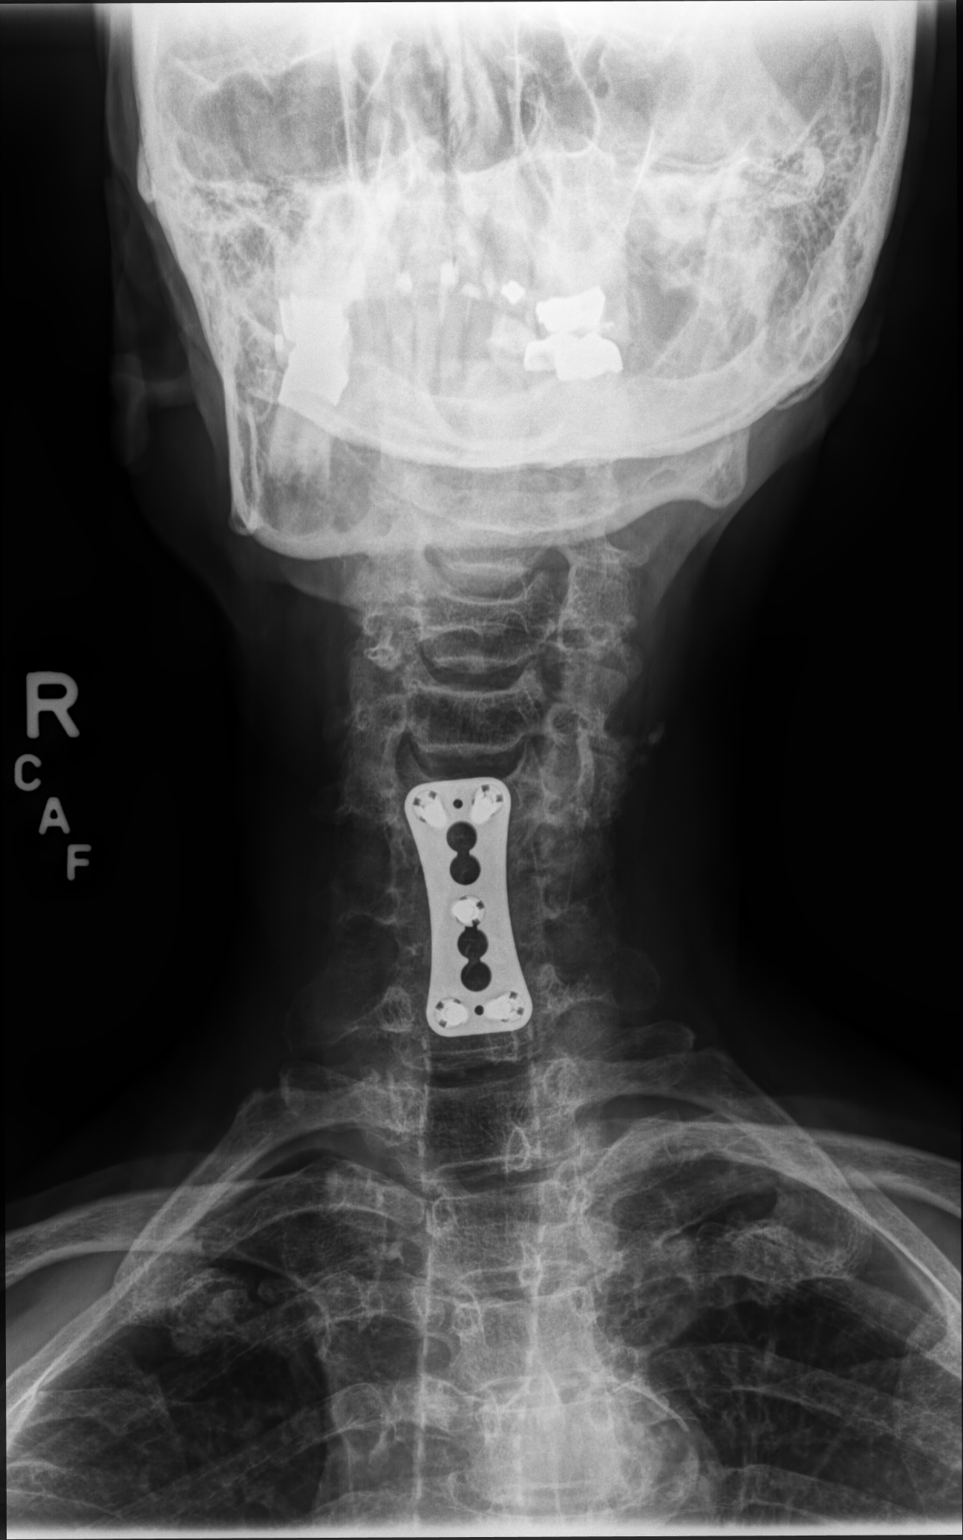

[cervical spine lat]
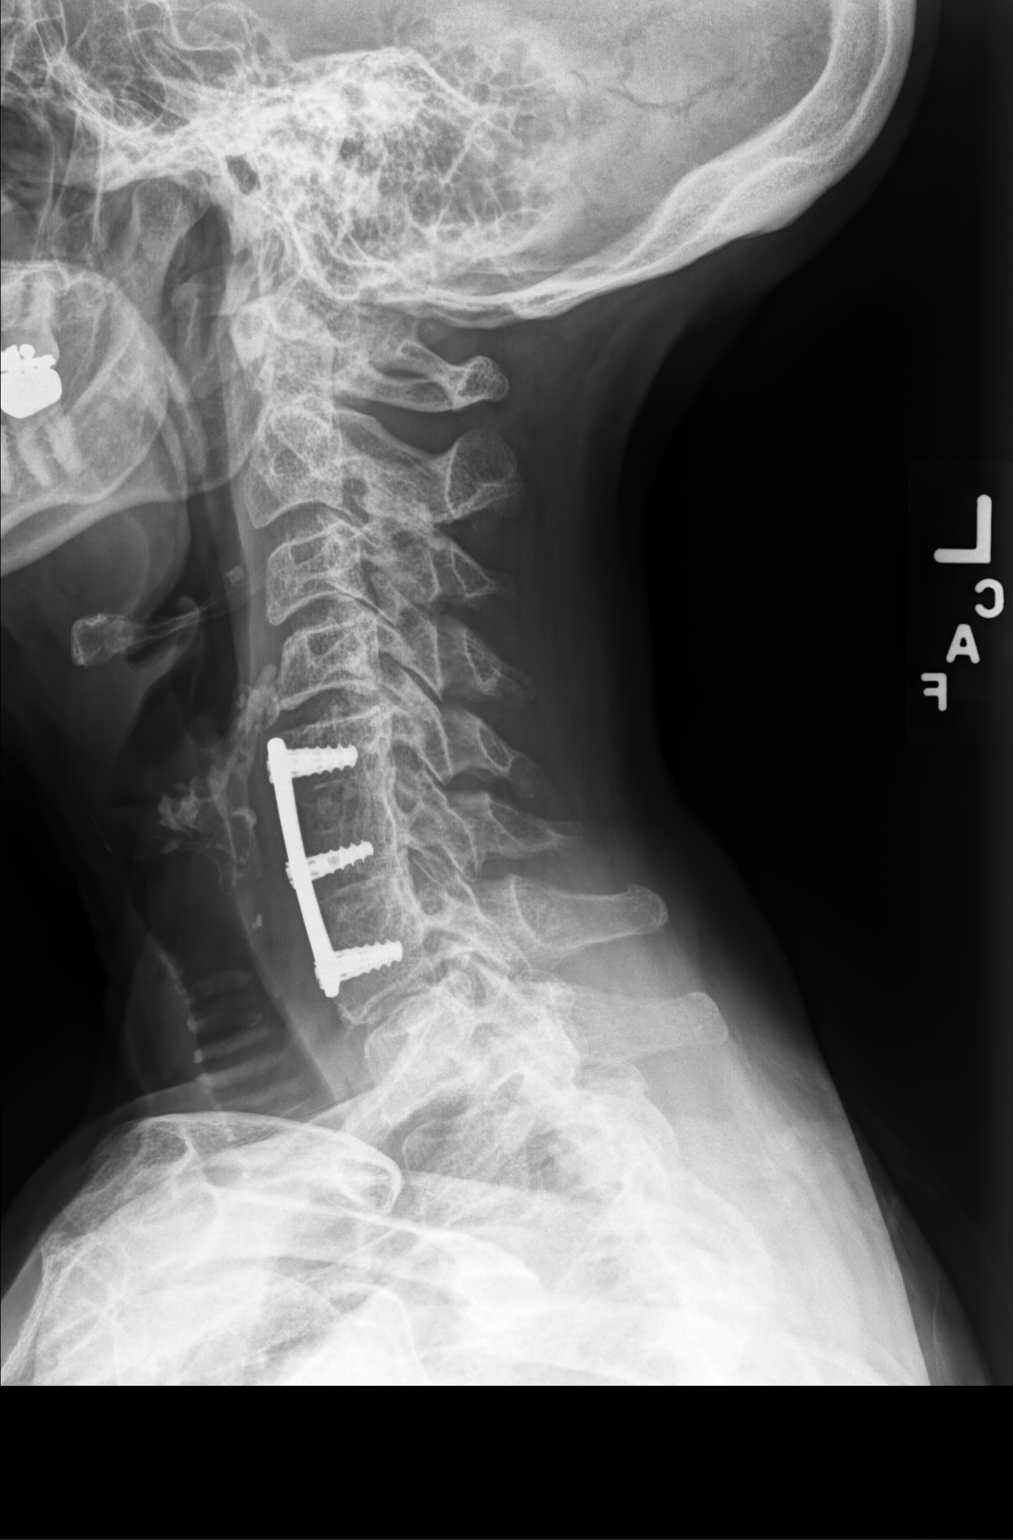

[2 of 2 positions shown; findings below may reference images not displayed]

FINDINGS: C1 to the superior endplate of T2 is imaged on the provided lateral
radiograph.

Post C5-C7 ACDF and interbody fusion with apparent complete bony
incorporation.

No definite anterolisthesis or retrolisthesis. An open mouth
odontoid radiograph was not provided

Cervical vertebral body heights appear preserved.

Remaining cervical intervertebral disc space heights appear
preserved.

Calcifications overlie the expected location the bilateral carotid
bulbs. Regional soft tissues appear otherwise normal.
Atherosclerotic plaque within the aortic arch.
IMPRESSION: 1. Post C5-C7 ACDF and intervertebral disc space replacement without
evidence of hardware failure or loosening.
2. No definite explanation for patient's chronic neck pain.

## 2019-05-17 IMAGING — DX DG THORACIC SPINE 3V
2 series · 2 of 2 positions shown · non-contrast
Comparison: Cervical spine radiographs - earlier same day

CLINICAL DATA: Pt presents today with complaint of pain/knot on
upper back. She has pain with the knot on the left side of she back.
No swimmers performed. Able to visualize C7-T1 on c-spine films.

EXAM:
THORACIC SPINE - 3 VIEWS

[thoracic spine ap]
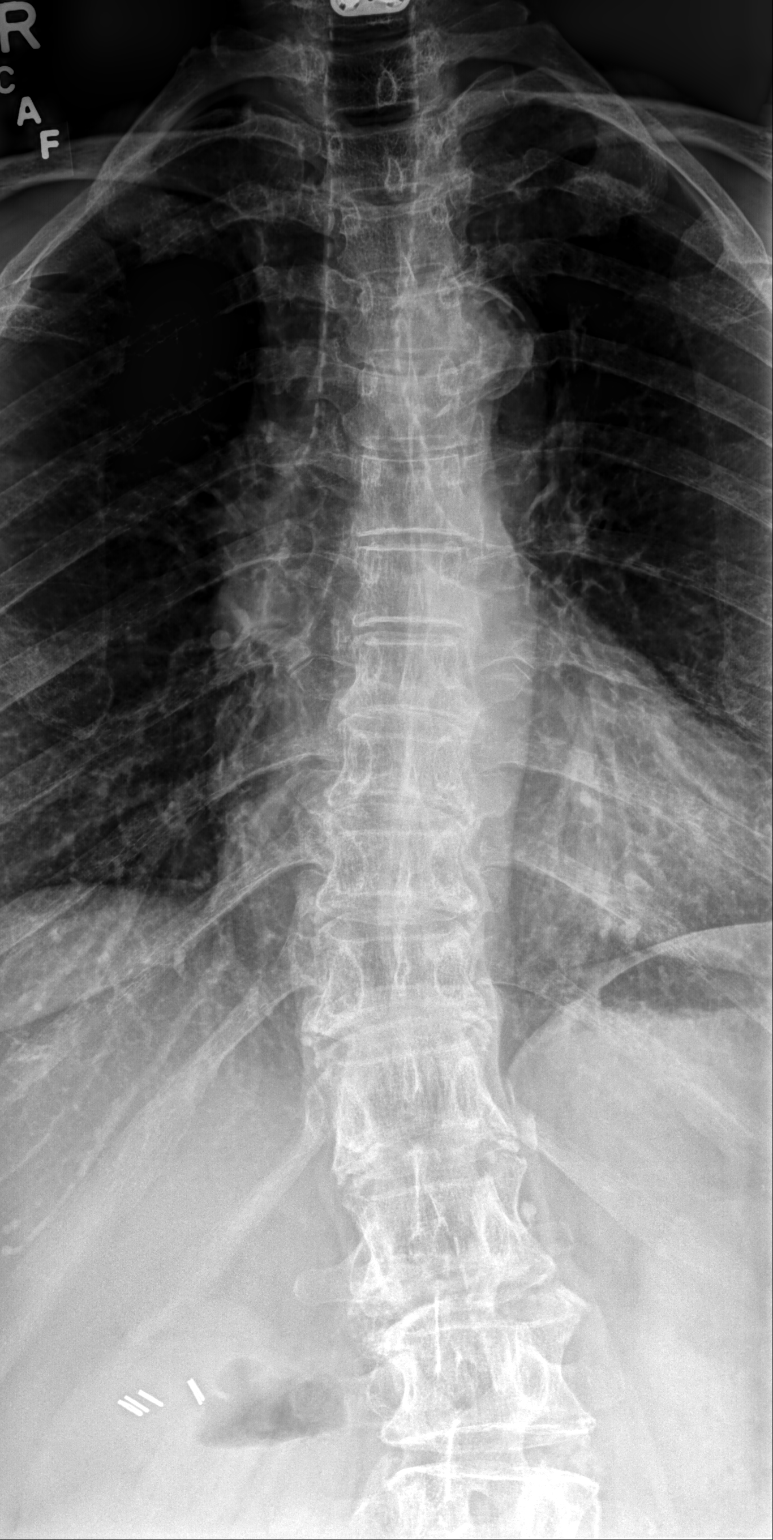

[thoracic spine lat]
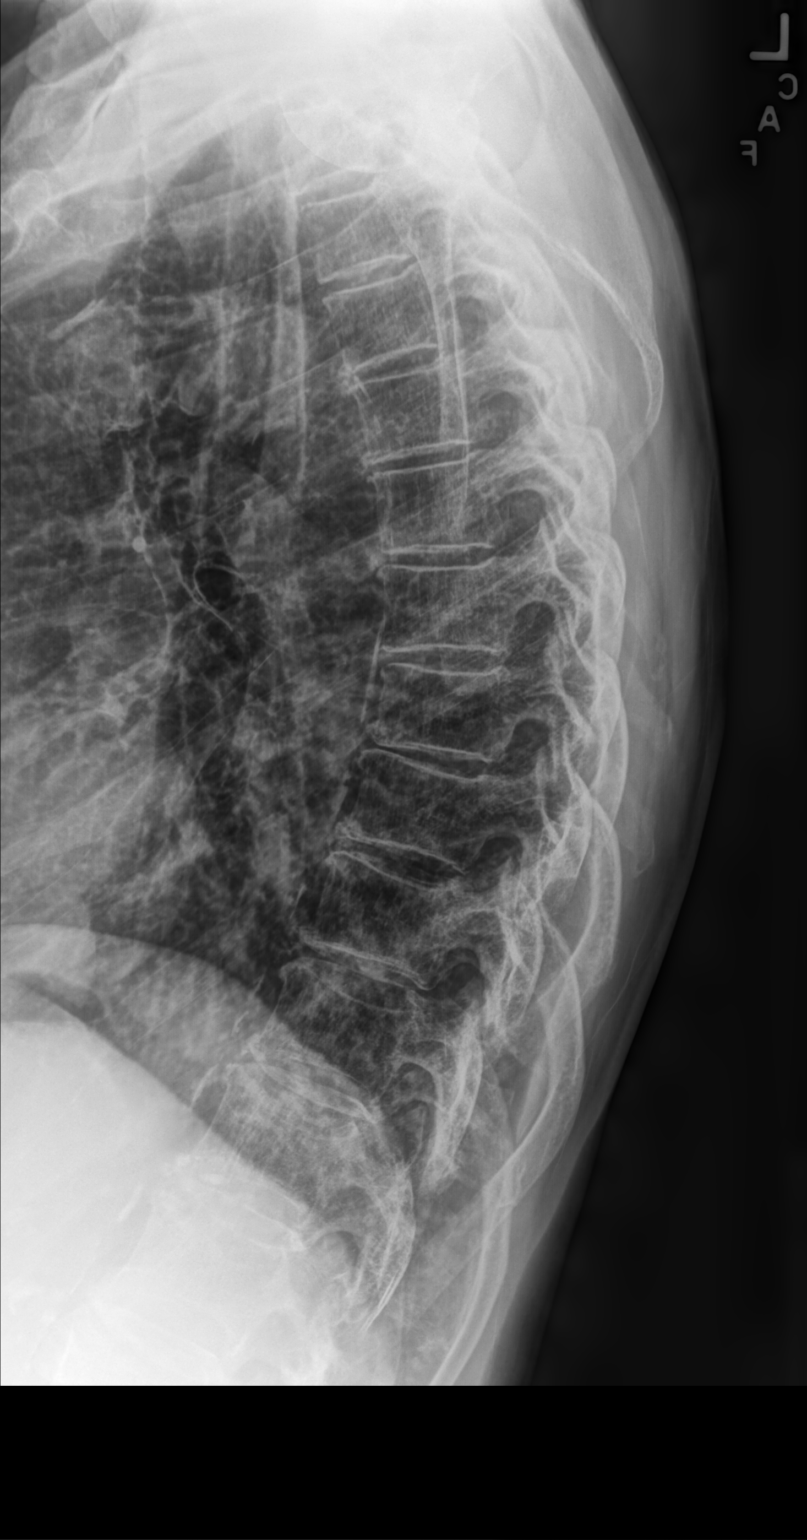

[2 of 2 positions shown; findings below may reference images not displayed]

FINDINGS: There is a mild-to-moderate scoliotic curvature of the thoracolumbar
spine with dominant mid component convex the right measuring
approximately 14 degrees (as measured on the superior endplate of T9
to the inferior endplate of T12). No anterolisthesis or
retrolisthesis.

Thoracic vertebral body heights are preserved.

Thoracic intervertebral disc space heights are preserved

Limited visualization of the adjacent thorax demonstrates
atherosclerotic plaque within the aortic arch. Post cholecystectomy.
Post lower cervical ACDF, incompletely evaluated.

Regional soft tissues appear normal.
IMPRESSION: 1. No definitive radiographic correlate for patient's palpable area
of concern.
2. Mild-to-moderate scoliotic curvature of the thoracolumbar spine.
3.  Aortic Atherosclerosis ([id number]).

## 2019-05-19 ENCOUNTER — Encounter: Payer: Self-pay | Admitting: *Deleted

## 2019-05-19 ENCOUNTER — Telehealth: Payer: Self-pay | Admitting: *Deleted

## 2019-05-19 NOTE — Telephone Encounter (Signed)
Received fax from [facility]: Aubagio requires PA. Called patient to clarify if she's taking medication; notes indicated she may not be. She states she's been off it for several weeks. She isn't sure if she'll restart, and has 4 weeks of med on hand. She requested PA be done. I advised will process PA for Aubagio. Patient verbalized understanding, appreciation. Started PA on CMM, key: [id number]. Failed: [place] Your PA request has been sent to [employer] Part D. AUBAGIO Tablet Type of coverage approved: Non-Formulary This approval authorizes your coverage from 04/15/2019 - 04/13/2020, unless we notify you otherwise, and as long as the following conditions apply: . you remain enrolled in our Medicare Part D prescription drug plan, . your physician or other prescriber continues to prescribe the medication for you, and . the medication continues to be safe for treating your condition.  Sent patient my chart and advised.

## 2019-05-20 ENCOUNTER — Ambulatory Visit: Payer: Medicare HMO | Attending: Internal Medicine

## 2019-05-20 DIAGNOSIS — Z23 Encounter for immunization: Secondary | ICD-10-CM

## 2019-05-20 NOTE — Progress Notes (Signed)
   Covid-19 Vaccination Clinic  Name:  LASHAYA KIENITZ    MRN: [id number] DOB: April 10, 1941  05/20/2019  Ms. Grosshans was observed post Covid-19 immunization for 15 minutes without incidence. She was provided with Vaccine Information Sheet and instruction to access the V-Safe system.   Ms. Harb was instructed to call 911 with any severe reactions post vaccine: [name] Kitchen Difficulty breathing  . Swelling of your face and throat  . A fast heartbeat  . A bad rash all over your body  . Dizziness and weakness    Immunizations Administered    Name Date Dose VIS Date Route   Pfizer COVID-19 Vaccine 05/20/2019 11:29 AM 0.3 mL 03/25/2019 Intramuscular   Manufacturer: [place]   Lot: [id number]   [place]: [id number]

## 2019-06-02 ENCOUNTER — Other Ambulatory Visit: Payer: Self-pay | Admitting: Gastroenterology

## 2019-06-02 DIAGNOSIS — K50919 Crohn's disease, unspecified, with unspecified complications: Secondary | ICD-10-CM

## 2019-06-10 ENCOUNTER — Encounter: Payer: Self-pay | Admitting: Gastroenterology

## 2019-06-10 ENCOUNTER — Ambulatory Visit: Payer: Medicare HMO | Admitting: Gastroenterology

## 2019-06-10 ENCOUNTER — Other Ambulatory Visit (INDEPENDENT_AMBULATORY_CARE_PROVIDER_SITE_OTHER): Payer: Medicare HMO

## 2019-06-10 VITALS — BP 130/84 | HR 80 | Temp 97.6°F | Ht 65.0 in | Wt 130.5 lb

## 2019-06-10 DIAGNOSIS — K50919 Crohn's disease, unspecified, with unspecified complications: Secondary | ICD-10-CM

## 2019-06-10 DIAGNOSIS — K5080 Crohn's disease of both small and large intestine without complications: Secondary | ICD-10-CM

## 2019-06-10 LAB — C-REACTIVE PROTEIN: CRP: <1 mg/dL (ref 0.5–20.0)

## 2019-06-10 LAB — COMPREHENSIVE METABOLIC PANEL
ALT: 16 U/L (ref 0–35)
AST: 23 U/L (ref 0–37)
Albumin: 4.3 g/dL (ref 3.5–5.2)
Alkaline Phosphatase: 45 U/L (ref 39–117)
BUN: 28 mg/dL — ABNORMAL HIGH (ref 6–23)
CO2: 31 mEq/L (ref 19–32)
Calcium: 10.3 mg/dL (ref 8.4–10.5)
Chloride: 102 mEq/L (ref 96–112)
Creatinine, Ser: 0.93 mg/dL (ref 0.40–1.20)
GFR: 58.37 mL/min — ABNORMAL LOW (ref >60.00)
Glucose, Bld: 76 mg/dL (ref 70–99)
Potassium: 3.6 mEq/L (ref 3.5–5.1)
Sodium: 141 mEq/L (ref 135–145)
Total Bilirubin: 0.6 mg/dL (ref 0.2–1.2)
Total Protein: 7.4 g/dL (ref 6.0–8.3)

## 2019-06-10 LAB — CBC WITH DIFFERENTIAL/PLATELET
Basophils Absolute: 0 10*3/uL (ref 0.0–0.1)
Basophils Relative: 1.2 % (ref 0.0–3.0)
Eosinophils Absolute: 0 10*3/uL (ref 0.0–0.7)
Eosinophils Relative: 0.5 % (ref 0.0–5.0)
HCT: 40.9 % (ref 36.0–46.0)
Hemoglobin: 13.6 g/dL (ref 12.0–15.0)
Lymphocytes Relative: 17.1 % (ref 12.0–46.0)
Lymphs Abs: 0.6 10*3/uL — ABNORMAL LOW (ref 0.7–4.0)
MCHC: 33.4 g/dL (ref 30.0–36.0)
MCV: 99.9 fl (ref 78.0–100.0)
Monocytes Absolute: 0.3 10*3/uL (ref 0.1–1.0)
Monocytes Relative: 8.8 % (ref 3.0–12.0)
Neutro Abs: 2.5 10*3/uL (ref 1.4–7.7)
Neutrophils Relative %: 72.4 % (ref 43.0–77.0)
Platelets: 207 10*3/uL (ref 150.0–400.0)
RBC: 4.09 Mil/uL (ref 3.87–5.11)
RDW: 16.6 % — ABNORMAL HIGH (ref 11.5–15.5)
WBC: 3.4 10*3/uL — ABNORMAL LOW (ref 4.0–10.5)

## 2019-06-10 MED ORDER — BUDESONIDE 3 MG PO CPEP: 9.0000 mg | ORAL_CAPSULE | Freq: Every day | ORAL | 0 refills | Status: DC

## 2019-06-10 NOTE — Progress Notes (Signed)
[name]    [id number]    August 11, 1940  Primary Care Physician:Copland, Gay Filler, MD  Referring Physician: Darreld Mclean, MD [address] [place],  [place] [address]   Chief complaint: Crohn's disease  HPI: 79 year old female with history of multiple sclerosis here for follow-up visit for Crohn's disease  Colonoscopy 03/23/2019: Diffuse inflammation with ulceration and stenosis in the terminal ileum. Mild inflammation in cecum and ascending colon otherwise rest of colon appeared normal. Biopsies consistent with chronic inflammation and Crohn's disease. Mildly active chronic ileitis and colitis  CT abdomen and pelvis 03/04/2018: Subacute diverticulitis involving proximal sigmoid colon  Colonoscopy March 2016: cecal ulcer, biopsies chronic minimal active colitis.  She is doing overall better on Entocort 9 mg daily.  Denies any abdominal pain, nausea, vomiting, decreased appetite, weight loss, constipation or diarrhea.  No melena or blood per rectum. She is having 2-3 semiformed soft stool BM daily.  Outpatient Encounter Medications as of 06/10/2019  Medication Sig  . Ascorbic Acid (VITAMIN C) 1000 MG tablet Take 1,000 mg by mouth daily.  [name] Kitchen b complex vitamins tablet Take 1 tablet by mouth daily.  . budesonide (ENTOCORT EC) 3 MG 24 hr capsule TAKE 3 CAPSULES (9 MG TOTAL) BY MOUTH DAILY. TAKE 9MG BY MOUTH DAILY FOR 90 DAYS.  [name] Kitchen Cholecalciferol (VITAMIN D3) 5000 units TABS Take 5,000 Units by mouth daily.   . cyanocobalamin (,VITAMIN B-12,) 1000 MCG/ML injection Inject 5m IM every 7 days x 4 doses then inject 114mIM every month  . desonide (DESOWEN) 0.05 % lotion Apply topically 2 (two) times daily. Use as needed for itchy ears (Patient taking differently: Apply topically daily. Use as needed for itchy ears)  . fenofibrate (TRICOR) 145 MG tablet Take 1 tablet (145 mg total) by mouth daily.  . [name] Kitchenabapentin (NEURONTIN) 600 MG tablet Take 1 tablet (600  mg total) by mouth 3 (three) times daily.  . Glucosamine 500 MG TABS Take 1 tablet by mouth 2 (two) times daily.   . [name] Kitchenoratadine (CLARITIN) 10 MG tablet Take 1 tablet (10 mg total) by mouth daily as needed for allergies.  . [name] Kitchenovastatin (MEVACOR) 40 MG tablet Take 1 tablet (40 mg total) by mouth 2 (two) times daily.  . memantine (NAMENDA) 10 MG tablet TAKE 1 TABLET BY MOUTH TWICE A DAY  . metoprolol succinate (TOPROL-XL) 25 MG 24 hr tablet Take 0.5 tablets (12.5 mg total) by mouth daily.  . Multiple Vitamin (MULTIVITAMIN) tablet Take 1 tablet by mouth daily.   . Probiotic Product (ALIGN PO) Take 1 tablet by mouth daily.   . Syringe/Needle, Disp, (SYRINGE 3CC/25GX1") 25G X 1" 3 ML MISC Use with B12 injections  . Teriflunomide (AUBAGIO PO) Take by mouth. States Dr.Willis put her on it.  . valACYclovir (VALTREX) 500 MG tablet Take 500 mg by mouth daily.   No facility-administered encounter medications on file as of 06/10/2019.    Allergies as of 06/10/2019 - Review Complete 06/10/2019  Allergen Reaction Noted  . Sulfa antibiotics Hives and Nausea And Vomiting 11/28/2008  . Vytorin [ezetimibe-simvastatin]  07/29/2013  . Aubagio [teriflunomide]  04/21/2019  . Cephalexin  11/23/2017  . Doxycycline  03/13/2011  . Erythromycin  11/28/2008  . Macrobid [nitrofurantoin macrocrystal] Hives 12/22/2012  . Morphine Nausea And Vomiting 11/28/2008  . Rituxan [rituximab]  04/21/2019  . Sulfonamide derivatives Hives and Nausea And Vomiting 11/28/2008  . Amoxicillin Hives 11/28/2008    Past  Medical History:  Diagnosis Date  . Allergy   . Arthritis, degenerative    both knees  . B12 deficiency 07/06/2014  . Cancer ([place])   . Colitis   . Diverticulosis   . Duodenitis   . Fatty liver   . Gastritis   . GERD (gastroesophageal reflux disease)   . Heart murmur   . Helicobacter pylori gastritis   . Hyperlipidemia   . Hypertension    not treated, pt reports "no"  . IBD (inflammatory bowel disease)   .  Lymphoma ([place])   . Memory difficulty 01/05/2013  . Memory loss    mild  . Multiple sclerosis ([place])   . Osteopenia   . Vitamin B12 deficiency     Past Surgical History:  Procedure Laterality Date  . ABDOMINAL HYSTERECTOMY  2010  . BLADDER SURGERY  2010   Bladder Tack  . CERVICAL SPINE SURGERY    . CHOLECYSTECTOMY    . HEMORRHOID SURGERY    . ROTATOR CUFF REPAIR    . SPINAL FUSION    . TUBAL LIGATION      Family History  Problem Relation Age of Onset  . Diabetes Father   . Heart disease Father   . Colon polyps Mother   . Breast cancer Cousin   . Colon cancer Maternal Grandmother        ? may be duodenal cancer  . Colon polyps Brother   . Hypertension Brother   . Pancreatic cancer Maternal Uncle   . Esophageal cancer Neg Hx   . Rectal cancer Neg Hx   . Stomach cancer Neg Hx     Social History   Socioeconomic History  . Marital status: Married    Spouse name: Eduard Clos   . Number of children: 4  . Years of education: 12+  . Highest education level: Not on file  Occupational History  . Occupation: Retired     Fish farm manager: Pharmacist, [employer]   Tobacco Use  . Smoking status: Former Smoker    Types: Cigarettes    Quit date: 06/27/1997    Years since quitting: 21.9  . Smokeless tobacco: Never Used  Substance and Sexual Activity  . Alcohol use: No  . Drug use: No  . Sexual activity: Not Currently  Other Topics Concern  . Not on file  Social History Narrative   Caffeine daily.    Patient lives at home with husband Eduard Clos but goes by [employer].    Patient has 4 children.    Patient has 1 year of college.    Patient is retired.    Patient is left handed.    Patient drinks 2 cups of coffee per day.   Social Determinants of Health   Financial Resource Strain:   . Difficulty of Paying Living Expenses: Not on file  Food Insecurity:   . Worried About Charity fundraiser in the Last Year: Not on file  . Ran Out of Food in the Last Year: Not on file    Transportation Needs:   . Lack of Transportation (Medical): Not on file  . Lack of Transportation (Non-Medical): Not on file  Physical Activity:   . Days of Exercise per Week: Not on file  . Minutes of Exercise per Session: Not on file  Stress:   . Feeling of Stress : Not on file  Social Connections:   . Frequency of Communication with Friends and Family: Not on file  . Frequency of Social Gatherings with Friends  and Family: Not on file  . Attends Religious Services: Not on file  . Active Member of Clubs or Organizations: Not on file  . Attends Archivist Meetings: Not on file  . Marital Status: Not on file  Intimate Partner Violence:   . Fear of Current or Ex-Partner: Not on file  . Emotionally Abused: Not on file  . Physically Abused: Not on file  . Sexually Abused: Not on file      Review of systems:  All other review of systems negative except as mentioned in the HPI.   Physical Exam: Vitals:   06/10/19 0829  Temp: 97.6 F (36.4 C)   Body mass index is 21.72 kg/m. Gen:      No acute distress Neuro: alert and oriented x 3 Psych: normal mood and affect  Data Reviewed:  Reviewed labs, radiology imaging, old records and pertinent past GI work up   Assessment and Plan/Recommendations: 68 yr F with history of multiple sclerosis, sigmoid diverticulitis and Crohn's disease with involvement of small and large intestine   She symptomatically better on Entocort 9 mg daily Recheck CRP, CBC and CMP  Will likely need anti-TNF therapy to maintain long-term remission, she has elected to start immunosuppressive therapy given her age and also history of multiple sclerosis.  She would like to discuss this further with her neurologist and her family.  We will provide further information regarding anti-TNF therapy so she can review it.  If she does not want to proceed with anti-TNF therapy, will plan to continue Entocort for 6 to 12 months  We will submit for  insurance approval for infliximab  Check TB QuantiFERON gold Check hep a and B immunization status, plan for vaccination if not immune  Check vaccination status for herpes zoster, pneumococcus and flu with PMD, consider vaccination if not immune  Continue Entocort 9 mg daily  Return in 6 to 8 weeks or sooner if needed  This visit required 40 minutes of patient care (this includes precharting, chart review, review of results, face-to-face time used for counseling as well as treatment plan and follow-up. The patient was provided an opportunity to ask questions and all were answered. The patient agreed with the plan and demonstrated an understanding of the instructions.  Damaris Hippo , MD    CC: Copland, Gay Filler, MD

## 2019-06-10 NOTE — Patient Instructions (Addendum)
If you are age 78 or older, your body mass index should be between 23-30. Your Body mass index is 21.72 kg/m. If this is out of the aforementioned range listed, please consider follow up with your Primary Care Provider.  If you are age 59 or younger, your body mass index should be between 19-25. Your Body mass index is 21.72 kg/m. If this is out of the aformentioned range listed, please consider follow up with your Primary Care Provider.   Your provider has requested that you go to the basement level for lab work before leaving today. Press "B" on the elevator. The lab is located at the first door on the left as you exit the elevator.  Please check vaccination state with primary care doctor to make sure your are up to date.  [facility] will apply for Remicade approval.   Refilled Entocort (Budesonide).   Infliximab injection What is this medicine? INFLIXIMAB (in [place] i mab) is used to treat Crohn's disease and ulcerative colitis. It is also used to treat ankylosing spondylitis, plaque psoriasis, and some forms of arthritis. This medicine may be used for other purposes; ask your health care provider or pharmacist if you have questions. COMMON BRAND NAME(S): AVSOLA, INFLECTRA, Remicade, RENFLEXIS What should I tell my health care provider before I take this medicine? They need to know if you have any of these conditions:  cancer  current or past resident of [place] or [place]  diabetes  exposure to tuberculosis  Guillain-Barre syndrome  heart failure  hepatitis or liver disease  immune system problems  infection  lung or breathing disease, like COPD  multiple sclerosis  receiving phototherapy for the skin  seizure disorder  an unusual or allergic reaction to infliximab, mouse proteins, other medicines, foods, dyes, or preservatives  pregnant or trying to get pregnant  breast-feeding How should I use this medicine? This medicine is for injection into a  vein. It is usually given by a health care professional in a hospital or clinic setting. A special MedGuide will be given to you by the pharmacist with each prescription and refill. Be sure to read this information carefully each time. Talk to your pediatrician regarding the use of this medicine in children. While this drug may be prescribed for children as young as 20 years of age for selected conditions, precautions do apply. Overdosage: If you think you have taken too much of this medicine contact a poison control center or emergency room at once. NOTE: This medicine is only for you. Do not share this medicine with others. What if I miss a dose? It is important not to miss your dose. Call your doctor or health care professional if you are unable to keep an appointment. What may interact with this medicine? Do not take this medicine with any of the following medications:  biologic medicines such as abatacept, adalimumab, anakinra, certolizumab, etanercept, golimumab, rituximab, secukinumab, tocilizumab, tofactinib, ustekinumab  live vaccines This list may not describe all possible interactions. Give your health care provider a list of all the medicines, herbs, non-prescription drugs, or dietary supplements you use. Also tell them if you smoke, drink alcohol, or use illegal drugs. Some items may interact with your medicine. What should I watch for while using this medicine? Your condition will be monitored carefully while you are receiving this medicine. Visit your doctor or health care professional for regular checks on your progress. You may need blood work done while you are taking this medicine. Before beginning  therapy, your doctor may do a test to see if you have been exposed to tuberculosis. Call your doctor or health care professional for advice if you get a fever, chills or sore throat, or other symptoms of a cold or flu. Do not treat yourself. This drug decreases your body's ability to  fight infections. Try to avoid being around people who are sick. This medicine may make the symptoms of heart failure worse in some patients. If you notice symptoms such as increased shortness of breath or swelling of the ankles or legs, contact your health care provider right away. If you are going to have surgery or dental work, tell your health care professional or dentist that you have received this medicine. If you take this medicine for plaque psoriasis, stay out of the sun. If you cannot avoid being in the sun, wear protective clothing and use sunscreen. Do not use sun lamps or tanning beds/booths. Talk to your doctor about your risk of cancer. You may be more at risk for certain types of cancers if you take this medicine. What side effects may I notice from receiving this medicine? Side effects that you should report to your doctor or health care professional as soon as possible:  allergic reactions like skin rash, itching or hives, swelling of the face, lips, or tongue  breathing problems  changes in vision  chest pain  fever or chills, usually related to the infusion  joint pain  pain, tingling, numbness in the hands or feet  redness, blistering, peeling or loosening of the skin, including inside the mouth  seizures  signs of infection - fever or chills, cough, sore throat, flu-like symptoms, pain or difficulty passing urine  signs and symptoms of liver injury like dark yellow or brown urine; general ill feeling; light-colored stools; loss of appetite; nausea; right upper belly pain; unusually weak or tired; yellowing of the eyes or skin  signs and symptoms of a stroke like changes in vision; confusion; trouble speaking or understanding; severe headaches; sudden numbness or weakness of the face, arm or leg; trouble walking; dizziness; loss of balance or coordination  swelling of the ankles, feet, or hands  swollen lymph nodes in the neck, underarm, or groin  areas  unusual bleeding or bruising  unusually weak or tired Side effects that usually do not require medical attention (report to your doctor or health care professional if they continue or are bothersome):  headache  nausea  stomach pain  upset stomach This list may not describe all possible side effects. Call your doctor for medical advice about side effects. You may report side effects to FDA at 1-800-FDA-1088. Where should I keep my medicine? This drug is given in a hospital or clinic and will not be stored at home. NOTE: This sheet is a summary. It may not cover all possible information. If you have questions about this medicine, talk to your doctor, pharmacist, or health care provider.  2020 Elsevier/Gold Standard (2016-04-30 13:45:32)

## 2019-06-13 DIAGNOSIS — Z1231 Encounter for screening mammogram for malignant neoplasm of breast: Secondary | ICD-10-CM | POA: Diagnosis not present

## 2019-06-13 LAB — HEPATITIS B SURFACE ANTIGEN: Hepatitis B Surface Ag: NONREACTIVE

## 2019-06-13 LAB — QUANTIFERON-TB GOLD PLUS
Mitogen-NIL: >10 IU/mL
NIL: 0.03 IU/mL
QuantiFERON-TB Gold Plus: NEGATIVE
TB1-NIL: 0 IU/mL
TB2-NIL: 0 IU/mL

## 2019-06-13 LAB — HEPATITIS B SURFACE ANTIBODY,QUALITATIVE: Hep B S Ab: NONREACTIVE

## 2019-06-13 LAB — HM MAMMOGRAPHY

## 2019-06-13 LAB — HEPATITIS A ANTIBODY, TOTAL: Hepatitis A AB,Total: NONREACTIVE

## 2019-06-14 ENCOUNTER — Encounter: Payer: Self-pay | Admitting: Family Medicine

## 2019-06-15 ENCOUNTER — Telehealth: Payer: Self-pay

## 2019-06-15 NOTE — Telephone Encounter (Signed)
I didn't see a letter mailed to patient for results or any mychart message sent Could you advise on what I should tell the patient?

## 2019-06-15 NOTE — Telephone Encounter (Signed)
Patient called in needing Dr. Lorelei Pont or the nurse to give her a call to discuss her test results from her labs, Please call the patient as soon as possible at [phone].   Thanks,

## 2019-06-16 ENCOUNTER — Other Ambulatory Visit: Payer: Self-pay

## 2019-06-16 NOTE — Telephone Encounter (Signed)
Sent patient mychart message

## 2019-06-20 ENCOUNTER — Telehealth: Payer: Self-pay | Admitting: Family Medicine

## 2019-06-20 ENCOUNTER — Ambulatory Visit (INDEPENDENT_AMBULATORY_CARE_PROVIDER_SITE_OTHER): Payer: Medicare HMO | Admitting: Gastroenterology

## 2019-06-20 DIAGNOSIS — K5080 Crohn's disease of both small and large intestine without complications: Secondary | ICD-10-CM | POA: Diagnosis not present

## 2019-06-20 DIAGNOSIS — Z23 Encounter for immunization: Secondary | ICD-10-CM | POA: Diagnosis not present

## 2019-06-20 NOTE — Chronic Care Management (AMB) (Signed)
  Chronic Care Management   Note  06/20/2019 Name: [name] MRN: [id number] DOB: 10-Jan-1941  [name] is a 79 y.o. year old female who is a primary care patient of Copland, Gay Filler, MD. I reached out to [name] by phone today in response to a referral sent by [name]'s PCP, Copland, Gay Filler, MD. Husband, Eduard Clos gave verbal consent for his wife, [name].  [name] was given information about Chronic Care Management services today including:  1. CCM service includes personalized support from designated clinical staff supervised by her physician, including individualized plan of care and coordination with other care providers 2. 24/7 contact phone numbers for assistance for urgent and routine care needs. 3. Service will only be billed when office clinical staff spend 20 minutes or more in a month to coordinate care. 4. Only one practitioner may furnish and bill the service in a calendar month. 5. The patient may stop CCM services at any time (effective at the end of the month) by phone call to the office staff.   Patient agreed to services and verbal consent obtained.   Follow up plan:   [name] [employer] Scheduler

## 2019-06-23 ENCOUNTER — Telehealth: Payer: Self-pay | Admitting: Family Medicine

## 2019-06-23 DIAGNOSIS — C8339 Diffuse large B-cell lymphoma, extranodal and solid organ sites: Secondary | ICD-10-CM | POA: Diagnosis not present

## 2019-06-23 DIAGNOSIS — E041 Nontoxic single thyroid nodule: Secondary | ICD-10-CM | POA: Diagnosis not present

## 2019-06-27 NOTE — Progress Notes (Signed)
[place] at [facility] [address], [place], [place] [address] [phone]  Date:  06/29/2019   Name:  [name]   DOB:  1941-02-18   MRN:  [id number]  PCP:  Darreld Mclean, MD    Chief Complaint: Hyperlipidemia (6 month followup) and Hypertension (bp reading)   History of Present Illness:  [name] is a 79 y.o. very pleasant female patient who presents with the following:  Here today for periodic recheck visit She has lymphoma which is being followed by Dr.Gwen Talbert Cage at [facility].   Most recent oncology visit earlier this month She also has history of multiple sclerosis, osteopenia, diverticulitis, dilated cardiomyopathy, IBD She saw her neurology provider in January  Most recent lipid panel was in August 2020 She is completed her COVID-19 vaccination series  Shingles vaccine- several years ago, it sounds as though she had Zostavax.  We discussed this, with her other health problems we will defer Shingrix at this time  She has noted a few warts on her left hand -she tried an over-the-counter cream which decrease prominence somewhat.  She would like to freeze it today if possible Her GI doc has recommended remicade infusions for her Crohn's disease; she asked for my opinion about this treatment She tends to have a BM 3-5x a day which is stable for her She is not having any discomfort She was using oral steroids in the past but not currently I advised her that I am certainly less knowledgeable than her gastroenterologist about using Remicade for Crohn's, I don't feel qualified to advise her regarding this issue  She is taking toprol xl 12.5 mg daily- however her BP seems to be actually running higher on this medication She brings me a sheet with several readings both before and after she began metoprolol  She is not taking her MS medications as of now She would like to get her POA forms completed for herself and her  husband  She is walking on a regular basis She is not doing yoga at the [facility] right now due to covid She tends to walk about 30 minutes a day or longer  She still tends to feel somewhat fatigued, but is overall doing okay  She has a good appetite, she has gained a few pounds which is great news  BP Readings from Last 3 Encounters:  06/29/19 124/84  06/10/19 130/84  04/20/19 130/79   Wt Readings from Last 3 Encounters:  06/29/19 133 lb (60.3 kg)  06/28/19 126 lb (57.2 kg)  06/10/19 130 lb 8 oz (59.2 kg)     Patient Active Problem List   Diagnosis Date Noted  . Dilated cardiomyopathy ([place]) 03/02/2019  . Osteopenia 12/25/2018  . Hypokalemia 02/28/2018  . Sepsis ([place]) 02/27/2018  . Diverticulitis 02/27/2018  . Diffuse large B-cell lymphoma ([place]) 02/27/2018  . Osteoarthritis of cervical spine 09/15/2016  . Abnormality of gait 11/07/2015  . Dizziness and giddiness 11/07/2015  . Mild cognitive impairment 01/09/2015  . B12 deficiency 07/06/2014  . Leukopenia 05/06/2013  . Memory difficulty 01/05/2013  . Dyslipidemia 11/28/2008  . Multiple sclerosis ([place]) 11/28/2008  . INFLAMMATORY BOWEL DISEASE 11/28/2008  . DIVERTICULOSIS, COLON 11/28/2008  . FATTY LIVER DISEASE 11/28/2008  . PERSONAL HISTORY OF PEPTIC ULCER DISEASE 11/28/2008    Past Medical History:  Diagnosis Date  . Allergy   . Arthritis, degenerative    both knees  . B12 deficiency 07/06/2014  .  Cancer ([place])   . Colitis   . Diverticulosis   . Duodenitis   . Fatty liver   . Gastritis   . GERD (gastroesophageal reflux disease)   . Heart murmur   . Helicobacter pylori gastritis   . Hyperlipidemia   . Hypertension    not treated, pt reports "no"  . IBD (inflammatory bowel disease)   . Lymphoma ([place])   . Memory difficulty 01/05/2013  . Memory loss    mild  . Multiple sclerosis ([place])   . Osteopenia   . Vitamin B12 deficiency     Past Surgical History:  Procedure Laterality Date  . ABDOMINAL HYSTERECTOMY   2010  . BLADDER SURGERY  2010   Bladder Tack  . CERVICAL SPINE SURGERY    . CHOLECYSTECTOMY    . HEMORRHOID SURGERY    . ROTATOR CUFF REPAIR    . SPINAL FUSION    . TUBAL LIGATION      Social History   Tobacco Use  . Smoking status: Former Smoker    Types: Cigarettes    Quit date: 06/27/1997    Years since quitting: 22.0  . Smokeless tobacco: Never Used  Substance Use Topics  . Alcohol use: No  . Drug use: No    Family History  Problem Relation Age of Onset  . Diabetes Father   . Heart disease Father   . Colon polyps Mother   . Heart failure Mother   . Breast cancer Cousin   . Colon cancer Maternal Grandmother        ? may be duodenal cancer  . Colon polyps Brother   . Hypertension Brother   . Pancreatic cancer Maternal Uncle   . Esophageal cancer Neg Hx   . Rectal cancer Neg Hx   . Stomach cancer Neg Hx     Allergies  Allergen Reactions  . Sulfa Antibiotics Hives and Nausea And Vomiting  . Vytorin [Ezetimibe-Simvastatin]     White skin patch  . Aubagio [Teriflunomide]     diarrhea  . Cephalexin   . Doxycycline     Raw mouth   . Erythromycin     REACTION: weakness  . Macrobid [Nitrofurantoin Macrocrystal] Hives  . Morphine Nausea And Vomiting  . Rituxan [Rituximab]     Stated had issue with lungs after her last treatment  . Sulfonamide Derivatives Hives and Nausea And Vomiting  . Amoxicillin Hives    Medication list has been reviewed and updated.  Current Outpatient Medications on File Prior to Visit  Medication Sig Dispense Refill  . Ascorbic Acid (VITAMIN C) 1000 MG tablet Take 1,000 mg by mouth daily.    [name] Kitchen b complex vitamins tablet Take 1 tablet by mouth daily.    . Cholecalciferol (VITAMIN D3) 5000 units TABS Take 5,000 Units by mouth daily.     . cyanocobalamin (,VITAMIN B-12,) 1000 MCG/ML injection Inject 39m IM every 7 days x 4 doses then inject 135mIM every month 4 mL 11  . desonide (DESOWEN) 0.05 % lotion Apply topically 2 (two) times  daily. Use as needed for itchy ears (Patient taking differently: Apply topically daily. Use as needed for itchy ears) 59 mL 1  . fenofibrate (TRICOR) 145 MG tablet Take 1 tablet (145 mg total) by mouth daily. 90 tablet 3  . gabapentin (NEURONTIN) 600 MG tablet Take 1 tablet (600 mg total) by mouth 3 (three) times daily. 270 tablet 3  . Glucosamine 500 MG TABS Take 1 tablet by mouth 2 (two)  times daily.     [name] Kitchen loratadine (CLARITIN) 10 MG tablet Take 1 tablet (10 mg total) by mouth daily as needed for allergies.    [name] Kitchen lovastatin (MEVACOR) 40 MG tablet Take 1 tablet (40 mg total) by mouth 2 (two) times daily. 180 tablet 3  . memantine (NAMENDA) 10 MG tablet TAKE 1 TABLET BY MOUTH TWICE A DAY 180 tablet 2  . metoprolol succinate (TOPROL-XL) 25 MG 24 hr tablet Take 0.5 tablets (12.5 mg total) by mouth daily. 45 tablet 1  . Multiple Vitamin (MULTIVITAMIN) tablet Take 1 tablet by mouth daily.     . Probiotic Product (ALIGN PO) Take 1 tablet by mouth daily.     . Syringe/Needle, Disp, (SYRINGE 3CC/25GX1") 25G X 1" 3 ML MISC Use with B12 injections 4 each 11  . valACYclovir (VALTREX) 500 MG tablet Take 500 mg by mouth daily.     No current facility-administered medications on file prior to visit.    Review of Systems:  As per HPI- otherwise negative.   Physical Examination: Vitals:   06/29/19 0818  BP: 124/84  Pulse: 84  Resp: 16  Temp: (!) 95.7 F (35.4 C)  SpO2: 97%   Vitals:   06/29/19 0818  Weight: 133 lb (60.3 kg)  Height: 5' 5"  (1.651 m)   Body mass index is 22.13 kg/m. Ideal Body Weight: Weight in (lb) to have BMI = 25: 149.9  GEN: no acute distress.  Has gained a few pounds, looks well HEENT: Atraumatic, Normocephalic.  Bilateral TM wnl, oropharynx normal.  PEERL,EOMI.   Ears and Nose: No external deformity. CV: RRR, No M/G/R. No JVD. No thrill. No extra heart sounds. PULM: CTA B, no wheezes, crackles, rhonchi. No retractions. No resp. distress. No accessory muscle use. ABD:  S, NT, ND, +BS. No rebound. No HSM. EXTR: No c/c/e PSYCH: Normally interactive. Conversant.  She has a cluster of approximately 7 flat verrucous skin lesions on the dorsum of the left hand Verbal consent is obtained.  Treated all lesions with liquid nitrogen x3 cycles each.  Patient tolerated well, no complications   Assessment and Plan: Hyperlipidemia, unspecified hyperlipidemia type  Multiple sclerosis ([place])  Crohn's disease without complication, unspecified gastrointestinal tract location [facility])  Lymphoma of solid organ excluding spleen, unspecified lymphoma type ([place])  Other viral warts  Here today for routine follow-up visit Discussed her Crohn's disease, encouraged her to follow-up with GI- she plans to do so Her lymphoma is currently in remission.  She was started on Valtrex at time of diagnosis for suppressive therapy, wonders if she can stop taking this now.  Her most recent bone marrow appears normal.  I advised her that she likely can stop her Valtrex, but certainly double check with her oncologist She may stop her metoprolol, will have her taper it over 1 week Defer labs today, none are acutely necessary Moderate medical decision making today This visit occurred during the SARS-CoV-2 public health emergency.  Safety protocols were in place, including screening questions prior to the visit, additional usage of staff PPE, and extensive cleaning of exam room while observing appropriate contact time as indicated for disinfecting solutions.    Signed Lamar Blinks, MD

## 2019-06-27 NOTE — Patient Instructions (Addendum)
Is great to see you again today, please keep me posted  We can plan to visit in about 6 months I think ok to stop valtrex- certainly double check with your oncology team first however Ok to stop metoprolol- take every other day for one week and then stop  We froze some warts on your hand today- can re-freeze if they fail to resolve

## 2019-06-27 NOTE — Progress Notes (Signed)
Nurse connected with patient 06/28/19 at  8:45 AM EDT by a telephone enabled telemedicine application and verified that I am speaking with the correct person using two identifiers. Patient stated full name and DOB. Patient gave permission to continue with virtual visit. Patient's location was at home and Nurse's location was at [place] office.   Subjective:   [name] is a 79 y.o. female who presents for an Initial Medicare Annual Wellness Visit.  Review of Systems   No ROS.  Medicare Wellness Virtual Visit.  Visual/audio telehealth visit, UTA vital signs.   See social history for additional risk factors.  Home Safety/Smoke Alarms: Feels safe in home. Smoke alarms in place.  Lives w/ husband in 3 story home. Does well w/ stairs.  Female:        Mammo-06/13/19      Dexa scan-  12/24/18. Recall 3 yrs.    CCS- 03/23/19.    Objective:    Today's Vitals   06/28/19 0846  Weight: 126 lb (57.2 kg)   Body mass index is 20.97 kg/m.  Advanced Directives 06/28/2019 02/27/2018 02/27/2018 09/18/2016 01/17/2016 07/06/2014 06/01/2014  Does Patient Have a Medical Advance Directive? No No No No No No No  Does patient want to make changes to medical advance directive? No - Patient declined - - - - - -  Would patient like information on creating a medical advance directive? - No - Patient declined No - Patient declined No - Patient declined No - patient declined information - No - patient declined information    Current Medications (verified) Outpatient Encounter Medications as of 06/28/2019  Medication Sig  . Ascorbic Acid (VITAMIN C) 1000 MG tablet Take 1,000 mg by mouth daily.  [name] Kitchen b complex vitamins tablet Take 1 tablet by mouth daily.  . Cholecalciferol (VITAMIN D3) 5000 units TABS Take 5,000 Units by mouth daily.   . cyanocobalamin (,VITAMIN B-12,) 1000 MCG/ML injection Inject 13m IM every 7 days x 4 doses then inject 121mIM every month  . desonide (DESOWEN) 0.05 % lotion Apply topically 2  (two) times daily. Use as needed for itchy ears (Patient taking differently: Apply topically daily. Use as needed for itchy ears)  . fenofibrate (TRICOR) 145 MG tablet Take 1 tablet (145 mg total) by mouth daily.  . [name] Kitchenabapentin (NEURONTIN) 600 MG tablet Take 1 tablet (600 mg total) by mouth 3 (three) times daily.  . Glucosamine 500 MG TABS Take 1 tablet by mouth 2 (two) times daily.   . [name] Kitchenoratadine (CLARITIN) 10 MG tablet Take 1 tablet (10 mg total) by mouth daily as needed for allergies.  . [name] Kitchenovastatin (MEVACOR) 40 MG tablet Take 1 tablet (40 mg total) by mouth 2 (two) times daily.  . memantine (NAMENDA) 10 MG tablet TAKE 1 TABLET BY MOUTH TWICE A DAY  . metoprolol succinate (TOPROL-XL) 25 MG 24 hr tablet Take 0.5 tablets (12.5 mg total) by mouth daily.  . Multiple Vitamin (MULTIVITAMIN) tablet Take 1 tablet by mouth daily.   . Probiotic Product (ALIGN PO) Take 1 tablet by mouth daily.   . Syringe/Needle, Disp, (SYRINGE 3CC/25GX1") 25G X 1" 3 ML MISC Use with B12 injections  . valACYclovir (VALTREX) 500 MG tablet Take 500 mg by mouth daily.  . budesonide (ENTOCORT EC) 3 MG 24 hr capsule Take 3 capsules (9 mg total) by mouth daily. (Patient not taking: Reported on 06/28/2019)   No facility-administered encounter medications on file as of 06/28/2019.    Allergies (verified) Sulfa antibiotics,  Vytorin [ezetimibe-simvastatin], Aubagio [teriflunomide], Cephalexin, Doxycycline, Erythromycin, Macrobid [nitrofurantoin macrocrystal], Morphine, Rituxan [rituximab], Sulfonamide derivatives, and Amoxicillin   History: Past Medical History:  Diagnosis Date  . Allergy   . Arthritis, degenerative    both knees  . B12 deficiency 07/06/2014  . Cancer ([place])   . Colitis   . Diverticulosis   . Duodenitis   . Fatty liver   . Gastritis   . GERD (gastroesophageal reflux disease)   . Heart murmur   . Helicobacter pylori gastritis   . Hyperlipidemia   . Hypertension    not treated, pt reports "no"  . IBD  (inflammatory bowel disease)   . Lymphoma ([place])   . Memory difficulty 01/05/2013  . Memory loss    mild  . Multiple sclerosis ([place])   . Osteopenia   . Vitamin B12 deficiency    Past Surgical History:  Procedure Laterality Date  . ABDOMINAL HYSTERECTOMY  2010  . BLADDER SURGERY  2010   Bladder Tack  . CERVICAL SPINE SURGERY    . CHOLECYSTECTOMY    . HEMORRHOID SURGERY    . ROTATOR CUFF REPAIR    . SPINAL FUSION    . TUBAL LIGATION     Family History  Problem Relation Age of Onset  . Diabetes Father   . Heart disease Father   . Colon polyps Mother   . Heart failure Mother   . Breast cancer Cousin   . Colon cancer Maternal Grandmother        ? may be duodenal cancer  . Colon polyps Brother   . Hypertension Brother   . Pancreatic cancer Maternal Uncle   . Esophageal cancer Neg Hx   . Rectal cancer Neg Hx   . Stomach cancer Neg Hx    Social History   Socioeconomic History  . Marital status: Married    Spouse name: Eduard Clos   . Number of children: 4  . Years of education: 12+  . Highest education level: Not on file  Occupational History  . 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 Occupation: part-time  Tobacco Use  . Smoking status: Former Smoker    Types: Cigarettes    Quit date: 06/27/1997    Years since quitting: 22.0  . Smokeless tobacco: Never Used  Substance and Sexual Activity  . Alcohol use: No  . Drug use: No  . Sexual activity: Not Currently  Other Topics Concern  . Not on file  Social History Narrative   Caffeine daily.    Patient lives at home with husband Eduard Clos but goes by [employer].    Patient has 4 children.    Patient has 1 year of college.    Patient is retired.    Patient is left handed.    Patient drinks 2 cups of coffee per day.   Social Determinants of Health   Financial Resource Strain:   . Difficulty of Paying Living Expenses:   Food Insecurity:   . Worried About Charity fundraiser in the Last  Year:   . Arboriculturist in the Last Year:   Transportation Needs:   . Film/video editor (Medical):   [name] Kitchen Lack of Transportation (Non-Medical):   Physical Activity:   . Days of Exercise per Week:   . Minutes of Exercise per Session:   Stress:   . Feeling of Stress :   Social Connections:   . Frequency of Communication with Friends and Family:   .  Frequency of Social Gatherings with Friends and Family:   . Attends Religious Services:   . Active Member of Clubs or Organizations:   . Attends Archivist Meetings:   [name] Kitchen Marital Status:     Tobacco Counseling Counseling given: Not Answered   Clinical Intake:  Pain : No/denies pain    Activities of Daily Living In your present state of health, do you have any difficulty performing the following activities: 06/28/2019  Hearing? N  Vision? N  Difficulty concentrating or making decisions? N  Walking or climbing stairs? N  Dressing or bathing? N  Doing errands, shopping? N  Preparing Food and eating ? N  Using the Toilet? N  In the past six months, have you accidently leaked urine? N  Do you have problems with loss of bowel control? Y  Comment pt states related to Chrohn's  Managing your Medications? N  Managing your Finances? N  Housekeeping or managing your Housekeeping? N  Some recent data might be hidden     Immunizations and Health Maintenance Immunization History  Administered Date(s) Administered  . Fluad Quad(high Dose 65+) 12/13/2018  . Hep A / Hep B 06/20/2019  . Influenza, High Dose Seasonal PF 01/26/2017, 01/18/2018  . PFIZER SARS-COV-2 Vaccination 04/29/2019, 05/20/2019  . Td 12/13/2018   There are no preventive care reminders to display for this patient.  Patient Care Team: Copland, Gay Filler, MD as PCP - General (Family Medicine) Richardo Priest, MD as PCP - Cardiology (Cardiology) Rogers Blocker, MD as Referring Physician (Hematology and Oncology) Day, Melvenia Beam, [facility] (Pharmacist)  Indicate any  recent Medical Services you may have received from other than [facility] providers in the past year (date may be approximate).     Assessment:   This is a routine wellness examination for Nicholle. Physical assessment deferred to PCP.  Hearing/Vision screen Unable to assess. This visit is enabled though telemedicine due to Covid 19.   Dietary issues and exercise activities discussed: Current Exercise Habits: Home exercise routine, Type of exercise: walking, Time (Minutes): 30, Frequency (Times/Week): 3, Weekly Exercise (Minutes/Week): 90, Intensity: Mild, Exercise limited by: None identified Diet (meal preparation, eat out, water intake, caffeinated beverages, dairy products, fruits and vegetables): in general, a "healthy" diet  , well balanced    Goals    . Maintain healthy active lifestyle and independence      Depression Screen PHQ 2/9 Scores 06/28/2019 07/03/2017 11/03/2013  PHQ - 2 Score 0 0 0    Fall Risk Fall Risk  06/28/2019 07/03/2017 06/28/2015 11/03/2013  Falls in the past year? 1 No No No  Number falls in past yr: 0 - - -  Injury with Fall? 0 - - -  Follow up Education provided;Falls prevention discussed - - -   Cognitive Function: Ad8 score reviewed for issues:  Issues making decisions:no  Less interest in hobbies / activities:no  Repeats questions, stories (family complaining):no  Trouble using ordinary gadgets (microwave, computer, phone):no  Forgets the month or year: no  Mismanaging finances: no  Remembering appts:no  Daily problems with thinking and/or memory:no Ad8 score is=0   MMSE - Mini Mental State Exam 10/14/2018 01/02/2017 06/26/2016 12/28/2015 11/07/2015  Orientation to time 5 5 5 4 5   Orientation to Place 5 5 5 5 5   Registration 3 3 3 3 3   Attention/ Calculation 2 4 5 5 5   Recall 2 3 3 3 3   Language- name 2 objects 1 2 2 2 2   Language- repeat 1 1  1 1 1   Language- follow 3 step command 3 3 3 3 3   Language- read & follow direction 1 1 1 1 1   Write a  sentence 1 1 1 1 1   Write a sentence-comments - - - - -  Copy design 1 1 1 1 1   Total score 25 29 30 29 30         Screening Tests Health Maintenance  Topic Date Due  . PNA vac Low Risk Adult (2 of 2 - PCV13) 07/05/2019  . MAMMOGRAM  06/12/2020  . COLONOSCOPY  03/22/2026  . TETANUS/TDAP  12/12/2028  . INFLUENZA VACCINE  Completed  . DEXA SCAN  Completed     Plan:    Please schedule your next medicare wellness visit with me in 1 yr.  Continue to eat heart healthy diet (full of fruits, vegetables, whole grains, lean protein, water--limit salt, fat, and sugar intake) and increase physical activity as tolerated.  Continue doing brain stimulating activities (puzzles, reading, adult coloring books, staying active) to keep memory sharp.     I have personally reviewed and noted the following in the patient's chart:   . Medical and social history . Use of alcohol, tobacco or illicit drugs  . Current medications and supplements . Functional ability and status . Nutritional status . Physical activity . Advanced directives . List of other physicians . Hospitalizations, surgeries, and ER visits in previous 12 months . Vitals . Screenings to include cognitive, depression, and falls . Referrals and appointments  In addition, I have reviewed and discussed with patient certain preventive protocols, quality metrics, and best practice recommendations. A written personalized care plan for preventive services as well as general preventive health recommendations were provided to patient.     Shela Nevin, [place]   06/28/2019

## 2019-06-28 ENCOUNTER — Encounter: Payer: Self-pay | Admitting: *Deleted

## 2019-06-28 ENCOUNTER — Ambulatory Visit (INDEPENDENT_AMBULATORY_CARE_PROVIDER_SITE_OTHER): Payer: Medicare HMO | Admitting: *Deleted

## 2019-06-28 ENCOUNTER — Other Ambulatory Visit: Payer: Self-pay

## 2019-06-28 VITALS — Wt 126.0 lb

## 2019-06-28 DIAGNOSIS — Z0000 Encounter for general adult medical examination without abnormal findings: Secondary | ICD-10-CM

## 2019-06-28 NOTE — Patient Instructions (Signed)
Please schedule your next medicare wellness visit with me in 1 yr.  Continue to eat heart healthy diet (full of fruits, vegetables, whole grains, lean protein, water--limit salt, fat, and sugar intake) and increase physical activity as tolerated.  Continue doing brain stimulating activities (puzzles, reading, adult coloring books, staying active) to keep memory sharp.    [name] , Thank you for taking time to come for your Medicare Wellness Visit. I appreciate your ongoing commitment to your health goals. Please review the following plan we discussed and let me know if I can assist you in the future.   These are the goals we discussed: Goals    . Maintain healthy active lifestyle and independence       This is a list of the screening recommended for you and due dates:  Health Maintenance  Topic Date Due  . Pneumonia vaccines (2 of 2 - PCV13) 07/05/2019  . Mammogram  06/12/2020  . Colon Cancer Screening  03/22/2026  . Tetanus Vaccine  12/12/2028  . Flu Shot  Completed  . DEXA scan (bone density measurement)  Completed    Preventive Care 65 Years and Older, Female Preventive care refers to lifestyle choices and visits with your health care provider that can promote health and wellness. This includes:  A yearly physical exam. This is also called an annual well check.  Regular dental and eye exams.  Immunizations.  Screening for certain conditions.  Healthy lifestyle choices, such as diet and exercise. What can I expect for my preventive care visit? Physical exam Your health care provider will check:  Height and weight. These may be used to calculate body mass index (BMI), which is a measurement that tells if you are at a healthy weight.  Heart rate and blood pressure.  Your skin for abnormal spots. Counseling Your health care provider may ask you questions about:  Alcohol, tobacco, and drug use.  Emotional well-being.  Home and relationship  well-being.  Sexual activity.  Eating habits.  History of falls.  Memory and ability to understand (cognition).  Work and work Statistician.  Pregnancy and menstrual history. What immunizations do I need?  Influenza (flu) vaccine  This is recommended every year. Tetanus, diphtheria, and pertussis (Tdap) vaccine  You may need a Td booster every 10 years. Varicella (chickenpox) vaccine  You may need this vaccine if you have not already been vaccinated. Zoster (shingles) vaccine  You may need this after age 1. Pneumococcal conjugate (PCV13) vaccine  One dose is recommended after age 61. Pneumococcal polysaccharide (PPSV23) vaccine  One dose is recommended after age 84. Measles, mumps, and rubella (MMR) vaccine  You may need at least one dose of MMR if you were born in 1957 or later. You may also need a second dose. Meningococcal conjugate (MenACWY) vaccine  You may need this if you have certain conditions. Hepatitis A vaccine  You may need this if you have certain conditions or if you travel or work in places where you may be exposed to hepatitis A. Hepatitis B vaccine  You may need this if you have certain conditions or if you travel or work in places where you may be exposed to hepatitis B. Haemophilus influenzae type b (Hib) vaccine  You may need this if you have certain conditions. You may receive vaccines as individual doses or as more than one vaccine together in one shot (combination vaccines). Talk with your health care provider about the risks and benefits of combination vaccines. What tests  do I need? Blood tests  Lipid and cholesterol levels. These may be checked every 5 years, or more frequently depending on your overall health.  Hepatitis C test.  Hepatitis B test. Screening  Lung cancer screening. You may have this screening every year starting at age 30 if you have a 30-pack-year history of smoking and currently smoke or have quit within the past  15 years.  Colorectal cancer screening. All adults should have this screening starting at age 52 and continuing until age 54. Your health care provider may recommend screening at age 72 if you are at increased risk. You will have tests every 1-10 years, depending on your results and the type of screening test.  Diabetes screening. This is done by checking your blood sugar (glucose) after you have not eaten for a while (fasting). You may have this done every 1-3 years.  Mammogram. This may be done every 1-2 years. Talk with your health care provider about how often you should have regular mammograms.  BRCA-related cancer screening. This may be done if you have a family history of breast, ovarian, tubal, or peritoneal cancers. Other tests  Sexually transmitted disease (STD) testing.  Bone density scan. This is done to screen for osteoporosis. You may have this done starting at age 68. Follow these instructions at home: Eating and drinking  Eat a diet that includes fresh fruits and vegetables, whole grains, lean protein, and low-fat dairy products. Limit your intake of foods with high amounts of sugar, saturated fats, and salt.  Take vitamin and mineral supplements as recommended by your health care provider.  Do not drink alcohol if your health care provider tells you not to drink.  If you drink alcohol: ? Limit how much you have to 0-1 drink a day. ? Be aware of how much alcohol is in your drink. In the [place] one drink equals one 12 oz bottle of beer (355 mL), one 5 oz glass of wine (148 mL), or one 1 oz glass of hard liquor (44 mL). Lifestyle  Take daily care of your teeth and gums.  Stay active. Exercise for at least 30 minutes on 5 or more days each week.  Do not use any products that contain nicotine or tobacco, such as cigarettes, e-cigarettes, and chewing tobacco. If you need help quitting, ask your health care provider.  If you are sexually active, practice safe sex. Use a  condom or other form of protection in order to prevent STIs (sexually transmitted infections).  Talk with your health care provider about taking a low-dose aspirin or statin. What's next?  Go to your health care provider once a year for a well check visit.  Ask your health care provider how often you should have your eyes and teeth checked.  Stay up to date on all vaccines. This information is not intended to replace advice given to you by your health care provider. Make sure you discuss any questions you have with your health care provider. Document Revised: 03/25/2018 Document Reviewed: 03/25/2018 Elsevier Patient Education  2020 [employer].

## 2019-06-29 ENCOUNTER — Encounter: Payer: Self-pay | Admitting: Family Medicine

## 2019-06-29 ENCOUNTER — Ambulatory Visit (INDEPENDENT_AMBULATORY_CARE_PROVIDER_SITE_OTHER): Payer: Medicare HMO | Admitting: Family Medicine

## 2019-06-29 ENCOUNTER — Other Ambulatory Visit: Payer: Self-pay

## 2019-06-29 VITALS — BP 124/84 | HR 84 | Temp 95.7°F | Resp 16 | Ht 65.0 in | Wt 133.0 lb

## 2019-06-29 DIAGNOSIS — K5090 Crohn's disease, unspecified, without complications: Secondary | ICD-10-CM | POA: Diagnosis not present

## 2019-06-29 DIAGNOSIS — E785 Hyperlipidemia, unspecified: Secondary | ICD-10-CM

## 2019-06-29 DIAGNOSIS — C8599 Non-Hodgkin lymphoma, unspecified, extranodal and solid organ sites: Secondary | ICD-10-CM | POA: Diagnosis not present

## 2019-06-29 DIAGNOSIS — B078 Other viral warts: Secondary | ICD-10-CM | POA: Diagnosis not present

## 2019-06-29 DIAGNOSIS — G35 Multiple sclerosis: Secondary | ICD-10-CM | POA: Diagnosis not present

## 2019-07-06 ENCOUNTER — Encounter: Payer: Self-pay | Admitting: Family Medicine

## 2019-07-06 ENCOUNTER — Ambulatory Visit (INDEPENDENT_AMBULATORY_CARE_PROVIDER_SITE_OTHER): Payer: Medicare HMO | Admitting: Family Medicine

## 2019-07-06 ENCOUNTER — Other Ambulatory Visit: Payer: Self-pay

## 2019-07-06 VITALS — BP 135/88 | HR 108 | Ht 65.0 in | Wt 130.5 lb

## 2019-07-06 DIAGNOSIS — T7840XA Allergy, unspecified, initial encounter: Secondary | ICD-10-CM | POA: Diagnosis not present

## 2019-07-06 MED ORDER — PREDNISONE 10 MG PO TABS: ORAL_TABLET | ORAL | 0 refills | Status: DC

## 2019-07-06 NOTE — Progress Notes (Signed)
Virtual Visit via Video Note  I connected with [name] on 07/06/19 at  8:40 AM EDT by a video enabled telemedicine application and verified that I am speaking with the correct person using two identifiers.  Location: Patient: home alone  Provider: home    I discussed the limitations of evaluation and management by telemedicine and the availability of in person appointments. The patient expressed understanding and agreed to proceed.  History of Present Illness: Pt is home with 5 days hx of rash on torso, thights and armpits.  + itching, redness , no pain   No change in laundry detergent or soaps etc She has a hx allergic reactions to things very easily ---- pt has not tried otc meds  No new meds     Observations/Objective: Vitals:   07/06/19 0840  BP: 135/88  Pulse: (!) 108  fine red rash on neck, chest , abd and thighs   Assessment and Plan: 1. Allergic rash present on examination pred taper con't benadryl prn itching  F/u pcp in a few days if no improvement  - predniSONE (DELTASONE) 10 MG tablet; TAKE 3 TABLETS PO QD FOR 3 DAYS THEN TAKE 2 TABLETS PO QD FOR 3 DAYS THEN TAKE 1 TABLET PO QD FOR 3 DAYS THEN TAKE 1/2 TAB PO QD FOR 3 DAYS  Dispense: 20 tablet; Refill: 0   Follow Up Instructions:    I discussed the assessment and treatment plan with the patient. The patient was provided an opportunity to ask questions and all were answered. The patient agreed with the plan and demonstrated an understanding of the instructions.   The patient was advised to call back or seek an in-person evaluation if the symptoms worsen or if the condition fails to improve as anticipated.  I provided 25 minutes of non-face-to-face time during this encounter.   Ann Held, DO

## 2019-07-07 ENCOUNTER — Telehealth: Payer: Medicare HMO

## 2019-07-12 DIAGNOSIS — H04123 Dry eye syndrome of bilateral lacrimal glands: Secondary | ICD-10-CM | POA: Diagnosis not present

## 2019-07-12 DIAGNOSIS — H43811 Vitreous degeneration, right eye: Secondary | ICD-10-CM | POA: Diagnosis not present

## 2019-07-12 DIAGNOSIS — H35373 Puckering of macula, bilateral: Secondary | ICD-10-CM | POA: Diagnosis not present

## 2019-07-12 DIAGNOSIS — Z961 Presence of intraocular lens: Secondary | ICD-10-CM | POA: Diagnosis not present

## 2019-07-12 DIAGNOSIS — H25811 Combined forms of age-related cataract, right eye: Secondary | ICD-10-CM | POA: Diagnosis not present

## 2019-07-12 DIAGNOSIS — H43813 Vitreous degeneration, bilateral: Secondary | ICD-10-CM | POA: Diagnosis not present

## 2019-07-12 DIAGNOSIS — G35 Multiple sclerosis: Secondary | ICD-10-CM | POA: Diagnosis not present

## 2019-07-12 DIAGNOSIS — H18423 Band keratopathy, bilateral: Secondary | ICD-10-CM | POA: Diagnosis not present

## 2019-07-20 ENCOUNTER — Ambulatory Visit (INDEPENDENT_AMBULATORY_CARE_PROVIDER_SITE_OTHER): Payer: Medicare HMO | Admitting: Gastroenterology

## 2019-07-20 ENCOUNTER — Other Ambulatory Visit: Payer: Self-pay

## 2019-07-20 DIAGNOSIS — Z23 Encounter for immunization: Secondary | ICD-10-CM | POA: Diagnosis not present

## 2019-08-02 ENCOUNTER — Telehealth: Payer: Self-pay

## 2019-08-02 ENCOUNTER — Other Ambulatory Visit: Payer: Self-pay | Admitting: Family

## 2019-08-02 DIAGNOSIS — I420 Dilated cardiomyopathy: Secondary | ICD-10-CM

## 2019-08-02 NOTE — Telephone Encounter (Signed)
Order for echo placed

## 2019-08-02 NOTE — Telephone Encounter (Signed)
-----   Message from Loel Dubonnet, NP sent at 08/02/2019 11:46 AM EDT ----- Regarding: Needs echo prior to OV, please Hi there,   I confirmed the refill of Metoprolol for [name]. At last visit with me as well as Dr. Bettina Gavia recommended repeat echo in 6 months to reassess her ejection fraction. Last echo 12/2018 - so six month mark would have been March. I see she has follow up with Dr. Bettina Gavia in June. Would you please reach out to her to see if we can schedule echo prior to her office visit? That way Dr. Bettina Gavia can discuss results with her during her upcoming visit.   Thanks,  Loel Dubonnet, NP

## 2019-08-14 ENCOUNTER — Other Ambulatory Visit: Payer: Self-pay | Admitting: Neurology

## 2019-09-16 ENCOUNTER — Telehealth: Payer: Self-pay

## 2019-09-16 ENCOUNTER — Other Ambulatory Visit (HOSPITAL_BASED_OUTPATIENT_CLINIC_OR_DEPARTMENT_OTHER): Payer: Medicare HMO

## 2019-09-16 ENCOUNTER — Other Ambulatory Visit: Payer: Self-pay

## 2019-09-16 ENCOUNTER — Ambulatory Visit (HOSPITAL_BASED_OUTPATIENT_CLINIC_OR_DEPARTMENT_OTHER)
Admission: RE | Admit: 2019-09-16 | Discharge: 2019-09-16 | Disposition: A | Discharge status: Home / Self Care | Payer: Medicare HMO | Source: Ambulatory Visit | Attending: Cardiology | Admitting: Cardiology

## 2019-09-16 DIAGNOSIS — Z008 Encounter for other general examination: Secondary | ICD-10-CM | POA: Diagnosis not present

## 2019-09-16 DIAGNOSIS — M1990 Unspecified osteoarthritis, unspecified site: Secondary | ICD-10-CM | POA: Diagnosis not present

## 2019-09-16 DIAGNOSIS — E785 Hyperlipidemia, unspecified: Secondary | ICD-10-CM | POA: Diagnosis not present

## 2019-09-16 DIAGNOSIS — C8590 Non-Hodgkin lymphoma, unspecified, unspecified site: Secondary | ICD-10-CM | POA: Diagnosis not present

## 2019-09-16 DIAGNOSIS — R030 Elevated blood-pressure reading, without diagnosis of hypertension: Secondary | ICD-10-CM | POA: Diagnosis not present

## 2019-09-16 DIAGNOSIS — E538 Deficiency of other specified B group vitamins: Secondary | ICD-10-CM | POA: Diagnosis not present

## 2019-09-16 DIAGNOSIS — I420 Dilated cardiomyopathy: Secondary | ICD-10-CM | POA: Diagnosis not present

## 2019-09-16 DIAGNOSIS — J309 Allergic rhinitis, unspecified: Secondary | ICD-10-CM | POA: Diagnosis not present

## 2019-09-16 DIAGNOSIS — H6090 Unspecified otitis externa, unspecified ear: Secondary | ICD-10-CM | POA: Diagnosis not present

## 2019-09-16 DIAGNOSIS — K5090 Crohn's disease, unspecified, without complications: Secondary | ICD-10-CM | POA: Diagnosis not present

## 2019-09-16 DIAGNOSIS — G3184 Mild cognitive impairment, so stated: Secondary | ICD-10-CM | POA: Diagnosis not present

## 2019-09-16 DIAGNOSIS — G35 Multiple sclerosis: Secondary | ICD-10-CM | POA: Diagnosis not present

## 2019-09-16 NOTE — Telephone Encounter (Signed)
Left message on patients voicemail to please return our call.   

## 2019-09-19 DIAGNOSIS — D1801 Hemangioma of skin and subcutaneous tissue: Secondary | ICD-10-CM | POA: Diagnosis not present

## 2019-09-19 DIAGNOSIS — L720 Epidermal cyst: Secondary | ICD-10-CM | POA: Diagnosis not present

## 2019-09-19 DIAGNOSIS — L810 Postinflammatory hyperpigmentation: Secondary | ICD-10-CM | POA: Diagnosis not present

## 2019-09-19 DIAGNOSIS — B078 Other viral warts: Secondary | ICD-10-CM | POA: Diagnosis not present

## 2019-09-19 DIAGNOSIS — D2271 Melanocytic nevi of right lower limb, including hip: Secondary | ICD-10-CM | POA: Diagnosis not present

## 2019-09-19 NOTE — Telephone Encounter (Signed)
Spoke with patients husband regarding results and recommendation.  He verbalizes understanding and is agreeable to plan of care. Advised patient/husband to call back with any issues or concerns.

## 2019-09-22 DIAGNOSIS — C8339 Diffuse large B-cell lymphoma, extranodal and solid organ sites: Secondary | ICD-10-CM | POA: Diagnosis not present

## 2019-09-22 NOTE — Progress Notes (Signed)
Cardiology Office Note:    Date:  09/23/2019   ID:  [name], DOB 1941/02/25, MRN [id number]  PCP:  Darreld Mclean, MD  Cardiologist:  Shirlee More, MD    Referring MD: Darreld Mclean, MD    ASSESSMENT:    1. Dilated cardiomyopathy ([place])   2. Mixed hyperlipidemia   3. Coronary artery calcification    PLAN:    In order of problems listed above:  1. Her ejection fraction was mildly reduced a year ago she has no evidence of heart failure we will recheck an echocardiogram if stable I would stop at this time she did not tolerate a beta-blocker and if her ejection fraction is further reduced initiate Entresto and MRA. 2. Continue lipid-lowering therapy fenofibrate and lovastatin 3. She had no evidence of ischemia on myocardial perfusion study   Next appointment: As needed if her ejection fraction is diminished I will bring her back to the office in September   Medication Adjustments/Labs and Tests Ordered: Current medicines are reviewed at length with the patient today.  Concerns regarding medicines are outlined above.  Orders Placed This Encounter  Procedures  . ECHOCARDIOGRAM COMPLETE   No orders of the defined types were placed in this encounter.   Chief Complaint  Patient presents with  . Follow-up    History of Present Illness:    [name] is a 79 y.o. female with a hx of cardiomyopathy and heart failure, non-Hodgkin's lymphoma treated with RCHOP Methotrexate and XRT, Crohn's disease, MS, hyperlipidemia, memory loss and CAC  last seen 02/03/2019. Compliance with diet, lifestyle and medications: Yes  She did not tolerate a beta-blocker was discontinued.  She is vigorous and active has no exercise intolerance shortness of breath chest pain palpitation or syncope and is New York Heart Association class I.  She is [facility] at [facility] and her cancer remains in remission.  Our plan is to recheck an echocardiogram in September including strain if it is  stable I would stop at this time and if ejection fraction is further reduced I would favor initiating vasodilator with Entresto and MRA.  She has no evidence of decompensated heart failure.  Echo 12/29/2018: IMPRESSIONS 1. The left ventricle has mildly reduced systolic function, with an ejection fraction of 45-50%. The cavity size was normal. Left ventricular diastolic Doppler parameters are consistent with impaired relaxation. Left ventricular diffuse hypokinesis. 2. The right ventricle has normal systolic function. The cavity was normal. There is no increase in right ventricular wall thickness. 3. There is mild mitral annular calcification present. 4. The aortic valve is tricuspid. Moderate thickening of the aortic valve. Moderate calcification of the aortic valve. Aortic valve regurgitation is mild by color flow Doppler. 5. The aorta is normal unless otherwise noted. 6. The average left ventricular global longitudinal strain is -15.3 %.  MPI 12/16/2018: Study Highlights  Nuclear stress EF: 46%. The left ventricular ejection fraction is mildly decreased (45-54%).  There was no ST segment deviation noted during stress.  This is a low risk study. There is no ischemia or evidence of previous infarction. The EF is mildly depressed.    Past Medical History:  Diagnosis Date  . Allergy   . Arthritis, degenerative    both knees  . B12 deficiency 07/06/2014  . Cancer ([place])   . Colitis   . Diverticulosis   . Duodenitis   . Fatty liver   . Gastritis   . GERD (gastroesophageal reflux disease)   . Heart murmur   .  Helicobacter pylori gastritis   . Hyperlipidemia   . Hypertension    not treated, pt reports "no"  . IBD (inflammatory bowel disease)   . Lymphoma ([place])   . Memory difficulty 01/05/2013  . Memory loss    mild  . Multiple sclerosis ([place])   . Osteopenia   . Vitamin B12 deficiency     Past Surgical History:  Procedure Laterality Date  . ABDOMINAL HYSTERECTOMY  2010   . BLADDER SURGERY  2010   Bladder Tack  . CERVICAL SPINE SURGERY    . CHOLECYSTECTOMY    . HEMORRHOID SURGERY    . ROTATOR CUFF REPAIR    . SPINAL FUSION    . TUBAL LIGATION      Current Medications: Current Meds  Medication Sig  . b complex vitamins tablet Take 1 tablet by mouth daily.  . Cholecalciferol (VITAMIN D3) 5000 units TABS Take 5,000 Units by mouth daily.   . cyanocobalamin (,VITAMIN B-12,) 1000 MCG/ML injection Inject 31m IM every 7 days x 4 doses then inject 119mIM every month  . desonide (DESOWEN) 0.05 % lotion Apply topically 2 (two) times daily. Use as needed for itchy ears (Patient taking differently: Apply topically daily. Use as needed for itchy ears)  . fenofibrate (TRICOR) 145 MG tablet Take 1 tablet (145 mg total) by mouth daily.  . [name] Kitchenabapentin (NEURONTIN) 600 MG tablet Take 1 tablet (600 mg total) by mouth 3 (three) times daily.  . Glucosamine 500 MG TABS Take 1 tablet by mouth 2 (two) times daily.   . [name] Kitchenoratadine (CLARITIN) 10 MG tablet Take 1 tablet (10 mg total) by mouth daily as needed for allergies.  . [name] Kitchenovastatin (MEVACOR) 40 MG tablet Take 1 tablet (40 mg total) by mouth 2 (two) times daily.  . memantine (NAMENDA) 10 MG tablet TAKE 1 TABLET BY MOUTH TWICE A DAY  . Multiple Vitamin (MULTIVITAMIN) tablet Take 1 tablet by mouth daily.   . Probiotic Product (ALIGN PO) Take 1 tablet by mouth daily.   . Syringe/Needle, Disp, (SYRINGE 3CC/25GX1") 25G X 1" 3 ML MISC Use with B12 injections     Allergies:   Sulfa antibiotics, Vytorin [ezetimibe-simvastatin], Aubagio [teriflunomide], Cephalexin, Doxycycline, Erythromycin, Macrobid [nitrofurantoin macrocrystal], Morphine, Rituxan [rituximab], Sulfonamide derivatives, and Amoxicillin   Social History   Socioeconomic History  . Marital status: Married    Spouse name: ChEduard Clos . Number of children: 4  . Years of education: 12+  . Highest education level: Not on file  Occupational History  . 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 Occupation: part-time  Tobacco Use  . Smoking status: Former Smoker    Types: Cigarettes    Quit date: 06/27/1997    Years since quitting: 22.2  . Smokeless tobacco: Never Used  Vaping Use  . Vaping Use: Never used  Substance and Sexual Activity  . Alcohol use: No  . Drug use: No  . Sexual activity: Not Currently  Other Topics Concern  . Not on file  Social History Narrative   Caffeine daily.    Patient lives at home with husband ChEduard Closut goes by [employer]   Patient has 4 children.    Patient has 1 year of college.    Patient is retired.    Patient is left handed.    Patient drinks 2 cups of coffee per day.   Social Determinants of Health   Financial Resource Strain: Low Risk   .  Difficulty of Paying Living Expenses: Not hard at all  Food Insecurity: No Food Insecurity  . Worried About Charity fundraiser in the Last Year: Never true  . Ran Out of Food in the Last Year: Never true  Transportation Needs: No Transportation Needs  . Lack of Transportation (Medical): No  . Lack of Transportation (Non-Medical): No  Physical Activity:   . Days of Exercise per Week:   . Minutes of Exercise per Session:   Stress:   . Feeling of Stress :   Social Connections:   . Frequency of Communication with Friends and Family:   . Frequency of Social Gatherings with Friends and Family:   . Attends Religious Services:   . Active Member of Clubs or Organizations:   . Attends Archivist Meetings:   [name] Kitchen Marital Status:      Family History: The patient's family history includes Breast cancer in her cousin; Colon cancer in her maternal grandmother; Colon polyps in her brother and mother; Diabetes in her father; Heart disease in her father; Heart failure in her mother; Hypertension in her brother; Pancreatic cancer in her maternal uncle. There is no history of Esophageal cancer, Rectal cancer, or Stomach cancer. ROS:   Please see  the history of present illness.    All other systems reviewed and are negative.  EKGs/Labs/Other Studies Reviewed:    The following studies were reviewed today:    Recent Labs: 12/13/2018: TSH 1.57 06/10/2019: ALT 16; BUN 28; Creatinine, Ser 0.93; Hemoglobin 13.6; Platelets 207.0; Potassium 3.6; Sodium 141  Recent Lipid Panel    Component Value Date/Time   CHOL 114 12/13/2018 0857   TRIG 368.0 (H) 12/13/2018 0857   HDL 36.20 (L) 12/13/2018 0857   CHOLHDL 3 12/13/2018 0857   VLDL 73.6 (H) 12/13/2018 0857   LDLCALC 24 11/23/2017 0947   LDLDIRECT 37.0 12/13/2018 0857    Physical Exam:    VS:  BP 140/80   Pulse 96   Ht 5' 5"  (1.651 m)   Wt 134 lb (60.8 kg)   SpO2 98%   BMI 22.30 kg/m     Wt Readings from Last 3 Encounters:  09/23/19 134 lb (60.8 kg)  07/06/19 130 lb 8 oz (59.2 kg)  06/29/19 133 lb (60.3 kg)     GEN:  Well nourished, well developed in no acute distress HEENT: Normal NECK: No JVD; No carotid bruits LYMPHATICS: No lymphadenopathy CARDIAC: RRR, no murmurs, rubs, gallops RESPIRATORY:  Clear to auscultation without rales, wheezing or rhonchi  ABDOMEN: Soft, non-tender, non-distended MUSCULOSKELETAL:  No edema; No deformity  SKIN: Warm and dry NEUROLOGIC:  Alert and oriented x 3 PSYCHIATRIC:  Normal affect    Signed, Shirlee More, MD  09/23/2019 9:42 AM    [place]

## 2019-09-23 ENCOUNTER — Other Ambulatory Visit: Payer: Self-pay

## 2019-09-23 ENCOUNTER — Ambulatory Visit: Payer: Medicare HMO | Admitting: Cardiology

## 2019-09-23 ENCOUNTER — Encounter: Payer: Self-pay | Admitting: Cardiology

## 2019-09-23 VITALS — BP 140/80 | HR 96 | Ht 65.0 in | Wt 134.0 lb

## 2019-09-23 DIAGNOSIS — E782 Mixed hyperlipidemia: Secondary | ICD-10-CM | POA: Diagnosis not present

## 2019-09-23 DIAGNOSIS — R69 Illness, unspecified: Secondary | ICD-10-CM | POA: Diagnosis not present

## 2019-09-23 DIAGNOSIS — I2510 Atherosclerotic heart disease of native coronary artery without angina pectoris: Secondary | ICD-10-CM | POA: Diagnosis not present

## 2019-09-23 DIAGNOSIS — I2584 Coronary atherosclerosis due to calcified coronary lesion: Secondary | ICD-10-CM

## 2019-09-23 DIAGNOSIS — I420 Dilated cardiomyopathy: Secondary | ICD-10-CM | POA: Diagnosis not present

## 2019-09-23 NOTE — Patient Instructions (Signed)
Medication Instructions:  Your physician recommends that you continue on your current medications as directed. Please refer to the Current Medication list given to you today.  *If you need a refill on your cardiac medications before your next appointment, please call your pharmacy*   Lab Work: None If you have labs (blood work) drawn today and your tests are completely normal, you will receive your results only by: [name] Kitchen MyChart Message (if you have MyChart) OR . A paper copy in the mail If you have any lab test that is abnormal or we need to change your treatment, we will call you to review the results.   Testing/Procedures: Your physician has requested that you have an echocardiogram. Echocardiography is a painless test that uses sound waves to create images of your heart. It provides your doctor with information about the size and shape of your heart and how well your heart's chambers and valves are working. This procedure takes approximately one hour. There are no restrictions for this procedure.     Follow-Up: At [facility], you and your health needs are our priority.  As part of our continuing mission to provide you with exceptional heart care, we have created designated Provider Care Teams.  These Care Teams include your primary Cardiologist (physician) and Advanced Practice Providers (APPs -  Physician Assistants and Nurse Practitioners) who all work together to provide you with the care you need, when you need it.  We recommend signing up for the patient portal called "MyChart".  Sign up information is provided on this After Visit Summary.  MyChart is used to connect with patients for Virtual Visits (Telemedicine).  Patients are able to view lab/test results, encounter notes, upcoming appointments, etc.  Non-urgent messages can be sent to your provider as well.   To learn more about what you can do with MyChart, go to [email or web address].    Your next appointment:   As needed    The format for your next appointment:   In Person  Provider:   Shirlee More, MD   Other Instructions

## 2019-09-26 ENCOUNTER — Ambulatory Visit: Payer: Medicare HMO | Admitting: Neurology

## 2019-09-26 ENCOUNTER — Other Ambulatory Visit: Payer: Self-pay

## 2019-09-26 ENCOUNTER — Encounter: Payer: Self-pay | Admitting: Neurology

## 2019-09-26 VITALS — BP 116/68 | HR 107 | Ht 65.5 in | Wt 134.0 lb

## 2019-09-26 DIAGNOSIS — G35 Multiple sclerosis: Secondary | ICD-10-CM

## 2019-09-26 DIAGNOSIS — R519 Headache, unspecified: Secondary | ICD-10-CM | POA: Insufficient documentation

## 2019-09-26 NOTE — Patient Instructions (Signed)
Continue current medications Check MRI of the brain  Check blood work today  See you back in 4 months

## 2019-09-26 NOTE — Progress Notes (Signed)
PATIENT: [name] DOB: [birth year]  REASON FOR VISIT: follow up HISTORY FROM: patient  HISTORY OF PRESENT ILLNESS: Today 09/26/19  [name] is a 79 year old female with history of multiple sclerosis.  In 2020, she was treated for lymphoma, she received rituximab, was taken off Gilenya.  She started Aubagio, but could not tolerate the medication.  Dr. Eugenie Birks suggested she either go back on Gilenya or try Mayzent, or Copaxone.  She has history of Crohn's disease.  She had MRI of the brain in May 2019, no enhancing lesions were noted, overall no significant change compared to prior MRI in August 2017.  She overall decided not to resume MS medication.  Indicates MS is overall stable, she may have occasional urinary leakage or bowel but she has Crohn's disease.  Denies any new weakness to the arms or legs.  Indicates a several month new history of onset of headache, occurs to the right frontal area, can be intense, as frequent as 2-3 headaches a week or may go 2 weeks without anything, takes Tylenol without much benefit.  Denies nausea, photophobia, phonophobia.  Is significant enough where it ruins her day, will lie down in the bed.  No other associated symptoms.  Denies any visual disturbance.  Actually went to see her dermatologist about this, they didn't find anything.  Her lymphoma remains stable.  She remains on gabapentin for achy pain and fatigue, and Namenda for memory.  Memory is overall stable.  She presents today for evaluation accompanied by her husband.  HISTORY 04/20/2019 SS: [name] is a 79 year old female with history of multiple sclerosis.  Last year she was treated for lymphoma, she received rituximab, was taken off Gilenya.  She was started on Aubagio, but she could not tolerate the medication.  Dr. Jannifer Franklin suggested she either go back on Gilenya or try Mayzent.  She has history of Crohn's disease.  She had MRI of the brain in May 2019, no enhancing lesions were noted,  overall, no significant change compared with prior MRI in August 2017.  She has been off Aubagio for about a month.  She says she had diarrhea as result, but she also has Crohn's disease.  She reports her MS is stable, she denies any new problems or concerns.  She says on a daily basis she really does not even notice she has MS.  She remains on gabapentin 3 times daily for muscle stiffness.  She reports her memory is stable.  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  She denies any falls.  She was considering starting Copaxone.  At this point, she wonders if she could remain off MS medications.  She presents today for evaluation unaccompanied.   REVIEW OF SYSTEMS: Out of a complete 14 system review of symptoms, the patient complains only of the following symptoms, and all other reviewed systems are negative.  Memory loss, headache  ALLERGIES: Allergies  Allergen Reactions  . Sulfa Antibiotics Hives and Nausea And Vomiting  . Vytorin [Ezetimibe-Simvastatin]     White skin patch  . Aubagio [Teriflunomide]     diarrhea  . Cephalexin   . Doxycycline     Raw mouth   . Erythromycin     REACTION: weakness  . Macrobid [Nitrofurantoin Macrocrystal] Hives  . Morphine Nausea And Vomiting  . Rituxan [Rituximab]     Stated had issue with lungs after her last treatment  . Sulfonamide Derivatives Hives and  Nausea And Vomiting  . Amoxicillin Hives    HOME MEDICATIONS: Outpatient Medications Prior to Visit  Medication Sig Dispense Refill  . b complex vitamins tablet Take 1 tablet by mouth daily.    . Cholecalciferol (VITAMIN D3) 5000 units TABS Take 5,000 Units by mouth daily.     . cyanocobalamin (,VITAMIN B-12,) 1000 MCG/ML injection Inject 37m IM every 7 days x 4 doses then inject 113mIM every month 4 mL 11  . desonide (DESOWEN) 0.05 % lotion Apply topically 2 (two) times daily. Use as needed for itchy ears (Patient taking differently: Apply topically daily.  Use as needed for itchy ears) 59 mL 1  . fenofibrate (TRICOR) 145 MG tablet Take 1 tablet (145 mg total) by mouth daily. 90 tablet 3  . gabapentin (NEURONTIN) 600 MG tablet Take 1 tablet (600 mg total) by mouth 3 (three) times daily. 270 tablet 3  . Glucosamine 500 MG TABS Take 1 tablet by mouth 2 (two) times daily.     . [name] Kitchenoratadine (CLARITIN) 10 MG tablet Take 1 tablet (10 mg total) by mouth daily as needed for allergies.    . [name] Kitchenovastatin (MEVACOR) 40 MG tablet Take 1 tablet (40 mg total) by mouth 2 (two) times daily. 180 tablet 3  . memantine (NAMENDA) 10 MG tablet TAKE 1 TABLET BY MOUTH TWICE A DAY 180 tablet 2  . Multiple Vitamin (MULTIVITAMIN) tablet Take 1 tablet by mouth daily.     . Probiotic Product (ALIGN PO) Take 1 tablet by mouth daily.     . Syringe/Needle, Disp, (SYRINGE 3CC/25GX1") 25G X 1" 3 ML MISC Use with B12 injections 4 each 11   No facility-administered medications prior to visit.    PAST MEDICAL HISTORY: Past Medical History:  Diagnosis Date  . Allergy   . Arthritis, degenerative    both knees  . B12 deficiency 07/06/2014  . [place]  . Colitis   . Diverticulosis   . Duodenitis   . Fatty liver   . Gastritis   . GERD (gastroesophageal reflux disease)   . Heart murmur   . Helicobacter pylori gastritis   . Hyperlipidemia   . Hypertension    not treated, pt reports "no"  . IBD (inflammatory bowel disease)   . [place]  . Memory difficulty 01/05/2013  . Memory loss    mild  . Multiple sclerosis ([facility]  . Osteopenia   . Vitamin B12 deficiency     PAST SURGICAL HISTORY: Past Surgical History:  Procedure Laterality Date  . ABDOMINAL HYSTERECTOMY  2010  . BLADDER SURGERY  2010   Bladder Tack  . CERVICAL SPINE SURGERY    . CHOLECYSTECTOMY    . HEMORRHOID SURGERY    . ROTATOR CUFF REPAIR    . SPINAL FUSION    . TUBAL LIGATION      FAMILY HISTORY: Family History  Problem Relation Age of Onset  . Diabetes Father   . Heart disease Father    . Colon polyps Mother   . Heart failure Mother   . Breast cancer Cousin   . Colon cancer Maternal Grandmother        ? may be duodenal cancer  . Colon polyps Brother   . Hypertension Brother   . Pancreatic cancer Maternal Uncle   . Esophageal cancer Neg Hx   . Rectal cancer Neg Hx   . Stomach cancer Neg Hx     SOCIAL HISTORY: Social History   Socioeconomic History  .  Marital status: Married    Spouse name: Eduard Clos   . Number of children: 4  . Years of education: 12+  . Highest education level: Not on file  Occupational History  . 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 Occupation: part-time  Tobacco Use  . Smoking status: Former Smoker    Types: Cigarettes    Quit date: 06/27/1997    Years since quitting: 22.2  . Smokeless tobacco: Never Used  Vaping Use  . Vaping Use: Never used  Substance and Sexual Activity  . Alcohol use: No  . Drug use: No  . Sexual activity: Not Currently  Other Topics Concern  . Not on file  Social History Narrative   Caffeine daily.    Patient lives at home with husband Eduard Clos but goes by [employer].    Patient has 4 children.    Patient has 1 year of college.    Patient is retired.    Patient is left handed.    Patient drinks 2 cups of coffee per day.   Social Determinants of Health   Financial Resource Strain: Low Risk   . Difficulty of Paying Living Expenses: Not hard at all  Food Insecurity: No Food Insecurity  . Worried About Charity fundraiser in the Last Year: Never true  . Ran Out of Food in the Last Year: Never true  Transportation Needs: No Transportation Needs  . Lack of Transportation (Medical): No  . Lack of Transportation (Non-Medical): No  Physical Activity:   . Days of Exercise per Week:   . Minutes of Exercise per Session:   Stress:   . Feeling of Stress :   Social Connections:   . Frequency of Communication with Friends and Family:   . Frequency of Social Gatherings with  Friends and Family:   . Attends Religious Services:   . Active Member of Clubs or Organizations:   . Attends Archivist Meetings:   [name] Kitchen Marital Status:   Intimate Partner Violence:   . Fear of Current or Ex-Partner:   . Emotionally Abused:   [name] Kitchen Physically Abused:   . Sexually Abused:    PHYSICAL EXAM  Vitals:   09/26/19 1301  BP: 116/68  Pulse: (!) 107  Weight: 134 lb (60.8 kg)  Height: 5' 5.5" (1.664 m)   Body mass index is 21.96 kg/m.  Generalized: Well developed, in no acute distress  MMSE - Mini Mental State Exam 10/14/2018 01/02/2017 06/26/2016  Orientation to time _0 Orientation to Place _1 Registration _2 Attention/ Calculation _3 Recall _4 Language- name 2 objects _5 Language- repeat _6 Language- follow 3 step command _7 Language- read & follow direction _8 Write a sentence _9 Write a sentence-comments - - -  Copy design _10 Total score _11 Neurological examination  Mentation: Alert oriented to time, place, history taking. Follows all commands speech and language fluent Cranial nerve II-XII: Pupils were equal round reactive to light. Extraocular movements were full, visual field were full on confrontational test. Facial sensation and strength were normal. Head turning and shoulder shrug  were normal and symmetric. Motor: The motor testing reveals 5 over 5 strength of all 4 extremities. Good symmetric motor tone is noted throughout.  Strong grip strength  bilaterally. Sensory: Sensory testing is intact to soft touch on all 4 extremities. No evidence of extinction is noted.  Coordination: Cerebellar testing reveals good finger-nose-finger and heel-to-shin bilaterally.  Gait and station: Gait is normal. Tandem gait is slightly unsteady. Romberg is negative. No drift is seen.  Reflexes: Deep tendon reflexes are symmetric and normal bilaterally.   DIAGNOSTIC DATA (LABS, IMAGING, TESTING) - I reviewed patient  records, labs, notes, testing and imaging myself where available.  Lab Results  Component Value Date   WBC 3.4 (L) 06/10/2019   HGB 13.6 06/10/2019   HCT 40.9 06/10/2019   MCV 99.9 06/10/2019   PLT 207.0 06/10/2019      Component Value Date/Time   NA 141 06/10/2019 0920   NA 144 04/20/2019 1316   NA 141 05/06/2013 0926   K 3.6 06/10/2019 0920   K 3.9 05/06/2013 0926   CL 102 06/10/2019 0920   CO2 31 06/10/2019 0920   CO2 26 05/06/2013 0926   GLUCOSE 76 06/10/2019 0920   GLUCOSE 119 05/06/2013 0926   BUN 28 (H) 06/10/2019 0920   BUN 33 (H) 04/20/2019 1316   BUN 21.6 05/06/2013 0926   CREATININE 0.93 06/10/2019 0920   CREATININE 0.8 05/06/2013 0926   CALCIUM 10.3 06/10/2019 0920   CALCIUM 10.0 05/06/2013 0926   PROT 7.4 06/10/2019 0920   PROT 6.2 04/20/2019 1316   PROT 7.7 05/06/2013 0926   ALBUMIN 4.3 06/10/2019 0920   ALBUMIN 4.1 04/20/2019 1316   ALBUMIN 4.1 05/06/2013 0926   AST 23 06/10/2019 0920   AST 24 05/06/2013 0926   ALT 16 06/10/2019 0920   ALT 16 05/06/2013 0926   ALKPHOS 45 06/10/2019 0920   ALKPHOS 49 05/06/2013 0926   BILITOT 0.6 06/10/2019 0920   BILITOT 0.4 04/20/2019 1316   BILITOT 0.58 05/06/2013 0926   GFRNONAA 51 (L) 04/20/2019 1316   GFRAA 59 (L) 04/20/2019 1316   Lab Results  Component Value Date   CHOL 114 12/13/2018   HDL 36.20 (L) 12/13/2018   LDLCALC 24 11/23/2017   LDLDIRECT 37.0 12/13/2018   TRIG 368.0 (H) 12/13/2018   CHOLHDL 3 12/13/2018   Lab Results  Component Value Date   HGBA1C 6.0 12/13/2018   Lab Results  Component Value Date   VITAMINB12 151 (L) 02/23/2019   Lab Results  Component Value Date   TSH 1.57 12/13/2018      ASSESSMENT AND PLAN 79 y.o. year old female  has a past medical history of Allergy, Arthritis, degenerative, B12 deficiency (07/06/2014), Cancer ([place]), Colitis, Diverticulosis, Duodenitis, Fatty liver, Gastritis, GERD (gastroesophageal reflux disease), Heart murmur, Helicobacter pylori gastritis,  Hyperlipidemia, Hypertension, IBD (inflammatory bowel disease), Lymphoma ([place]), Memory difficulty (01/05/2013), Memory loss, Multiple sclerosis ([place]), Osteopenia, and Vitamin B12 deficiency. here with:  1.  Multiple sclerosis 2.  History of lymphoma 3.  New onset headache 4.  Reported mild memory disturbance  She has remained off MS medications for about 6 months at this point.  She has not had any new or worsening MS symptoms.  She does report a few month history of new onset right-sided headache.  I will order MRI of the brain with and without contrast, for evaluation of new onset headache, along with surveillance of MS.  I will check routine blood work, will also check ESR, CRP. She will remain on gabapentin and Namenda.  For headache, Tylenol is not completely relieving the headache, if no cause of headache is determined, we may possibly consider Fioricet for acute  headache, wouldn't offer triptan given her age.  She will follow-up in 4 months or sooner if needed.  I spent 30 minutes of face-to-face and non-face-to-face time with patient.  This included previsit chart review, lab review, study review, order entry, electronic health record documentation, patient education.  Butler Denmark, AGNP-C, DNP 09/26/2019, 1:54 PM [facility] [address] [place],  [address] [phone]

## 2019-09-27 ENCOUNTER — Ambulatory Visit: Payer: Medicare HMO | Admitting: Neurology

## 2019-09-27 ENCOUNTER — Telehealth: Payer: Self-pay | Admitting: Neurology

## 2019-09-27 LAB — CBC WITH DIFFERENTIAL/PLATELET
Basophils Absolute: 0 10*3/uL (ref 0.0–0.2)
Basos: 1 %
EOS (ABSOLUTE): 0.1 10*3/uL (ref 0.0–0.4)
Eos: 2 %
Hematocrit: 39.1 % (ref 34.0–46.6)
Hemoglobin: 12.9 g/dL (ref 11.1–15.9)
Immature Grans (Abs): 0 10*3/uL (ref 0.0–0.1)
Immature Granulocytes: 1 %
Lymphocytes Absolute: 0.9 10*3/uL (ref 0.7–3.1)
Lymphs: 25 %
MCH: 32.3 pg (ref 26.6–33.0)
MCHC: 33 g/dL (ref 31.5–35.7)
MCV: 98 fL — ABNORMAL HIGH (ref 79–97)
Monocytes Absolute: 0.4 10*3/uL (ref 0.1–0.9)
Monocytes: 10 %
Neutrophils Absolute: 2.3 10*3/uL (ref 1.4–7.0)
Neutrophils: 61 %
Platelets: 195 10*3/uL (ref 150–450)
RBC: 4 x10E6/uL (ref 3.77–5.28)
RDW: 12.9 % (ref 11.7–15.4)
WBC: 3.8 10*3/uL (ref 3.4–10.8)

## 2019-09-27 LAB — COMPREHENSIVE METABOLIC PANEL
ALT: 14 IU/L (ref 0–32)
AST: 34 IU/L (ref 0–40)
Albumin/Globulin Ratio: 1.6 (ref 1.2–2.2)
Albumin: 4.2 g/dL (ref 3.7–4.7)
Alkaline Phosphatase: 61 IU/L (ref 48–121)
BUN/Creatinine Ratio: 22 (ref 12–28)
BUN: 23 mg/dL (ref 8–27)
Bilirubin Total: 0.4 mg/dL (ref 0.0–1.2)
CO2: 20 mmol/L (ref 20–29)
Calcium: 9.6 mg/dL (ref 8.7–10.3)
Chloride: 102 mmol/L (ref 96–106)
Creatinine, Ser: 1.04 mg/dL — ABNORMAL HIGH (ref 0.57–1.00)
GFR calc Af Amer: 60 mL/min/{1.73_m2} (ref >59)
GFR calc non Af Amer: 52 mL/min/{1.73_m2} — ABNORMAL LOW (ref >59)
Globulin, Total: 2.7 g/dL (ref 1.5–4.5)
Glucose: 132 mg/dL — ABNORMAL HIGH (ref 65–99)
Potassium: 4 mmol/L (ref 3.5–5.2)
Sodium: 139 mmol/L (ref 134–144)
Total Protein: 6.9 g/dL (ref 6.0–8.5)

## 2019-09-27 LAB — C-REACTIVE PROTEIN: CRP: 6 mg/L (ref 0–10)

## 2019-09-27 LAB — SEDIMENTATION RATE: Sed Rate: 22 mm/hr (ref 0–40)

## 2019-09-27 NOTE — Progress Notes (Signed)
I have read the note, and I agree with the clinical assessment and plan.  [name] K [name]   

## 2019-09-27 NOTE — Telephone Encounter (Signed)
[employer] medicare order sent to [facility]. They will obtain the auth and reach out to the patient to schedule.  °

## 2019-09-28 ENCOUNTER — Telehealth: Payer: Self-pay

## 2019-09-28 DIAGNOSIS — I420 Dilated cardiomyopathy: Secondary | ICD-10-CM

## 2019-09-28 NOTE — Telephone Encounter (Signed)
Note started to re-order echo so that it can be scheduled.

## 2019-09-28 NOTE — Telephone Encounter (Signed)
Pt verified by name and DOB, results given per provider, pt voiced understanding all question answered. 

## 2019-09-28 NOTE — Telephone Encounter (Signed)
-----  Message from Suzzanne Cloud, NP sent at 09/27/2019  5:58 AM EDT ----- Labs show random glucose elevated 132, creatinine slightly high 1.04. Before MRI, make sure she drinks plenty of water. Laboratory makers of inflammation (ESR, CRP) were normal.

## 2019-09-29 NOTE — Telephone Encounter (Signed)
[employer] medicare Josem Kaufmann: [id number] (exp. 09/28/19 to 03/26/20) patient is scheduled at [facility] for 10/11/19.

## 2019-10-11 ENCOUNTER — Other Ambulatory Visit: Payer: Self-pay

## 2019-10-11 ENCOUNTER — Ambulatory Visit
Admission: RE | Admit: 2019-10-11 | Discharge: 2019-10-11 | Disposition: A | Discharge status: Home / Self Care | Payer: Medicare HMO | Source: Ambulatory Visit | Attending: Neurology | Admitting: Neurology

## 2019-10-11 DIAGNOSIS — G35 Multiple sclerosis: Secondary | ICD-10-CM

## 2019-10-11 MED ORDER — GADOBENATE DIMEGLUMINE 529 MG/ML IV SOLN
12.0000 mL | Freq: Once | INTRAVENOUS | Status: AC | PRN
  Administered 2019-10-11: 12 mL via INTRAVENOUS

## 2019-10-13 ENCOUNTER — Telehealth: Payer: Self-pay

## 2019-10-13 NOTE — Telephone Encounter (Signed)
Pt has called Candace,CMA back.  Please call with results.

## 2019-10-13 NOTE — Telephone Encounter (Signed)
-----   Message from Suzzanne Cloud, NP sent at 10/13/2019  7:32 AM EDT ----- Please call the patient.  MRI of the brain with and without contrast was overall stable.  No acute findings.   IMPRESSION:   MRI brain (with and without) demonstrating: - Stable, scattered periventricular and subcortical foci of non-specific T2 hyperintensities. No abnormal lesions on post-contrast views.  - No acute findings.

## 2019-10-13 NOTE — Telephone Encounter (Signed)
Results given.

## 2019-10-13 NOTE — Telephone Encounter (Signed)
Attempted to call pt, LVM

## 2019-10-18 ENCOUNTER — Ambulatory Visit: Payer: Medicare HMO | Admitting: Neurology

## 2019-10-19 ENCOUNTER — Telehealth: Payer: Self-pay

## 2019-10-19 NOTE — Telephone Encounter (Signed)
-----   Message from Suzzanne Cloud, NP sent at 10/13/2019  7:32 AM EDT ----- Please call the patient.  MRI of the brain with and without contrast was overall stable.  No acute findings.   IMPRESSION:   MRI brain (with and without) demonstrating: - Stable, scattered periventricular and subcortical foci of non-specific T2 hyperintensities. No abnormal lesions on post-contrast views.  - No acute findings.

## 2019-10-19 NOTE — Telephone Encounter (Signed)
Pt verified by name and DOB, results given per provider, pt voiced understanding all question answered. 

## 2019-11-03 ENCOUNTER — Other Ambulatory Visit: Payer: Self-pay

## 2019-11-03 DIAGNOSIS — L299 Pruritus, unspecified: Secondary | ICD-10-CM

## 2019-11-03 MED ORDER — DESONIDE 0.05 % EX LOTN: TOPICAL_LOTION | Freq: Two times a day (BID) | CUTANEOUS | 1 refills | Status: DC

## 2019-11-20 ENCOUNTER — Other Ambulatory Visit: Payer: Self-pay | Admitting: Family Medicine

## 2019-11-20 DIAGNOSIS — E785 Hyperlipidemia, unspecified: Secondary | ICD-10-CM

## 2019-12-06 ENCOUNTER — Ambulatory Visit: Payer: Self-pay | Attending: Internal Medicine

## 2019-12-06 DIAGNOSIS — Z23 Encounter for immunization: Secondary | ICD-10-CM

## 2019-12-06 NOTE — Progress Notes (Signed)
   Covid-19 Vaccination Clinic  Name:  NATALY PACIFICO    MRN: [id number] DOB: 1941-02-18  12/06/2019  Ms. Bass was observed post Covid-19 immunization for 15 minutes without incident. She was provided with Vaccine Information Sheet and instruction to access the V-Safe system.   Ms. Smoot was instructed to call 911 with any severe reactions post vaccine: [name] Kitchen Difficulty breathing  . Swelling of face and throat  . A fast heartbeat  . A bad rash all over body  . Dizziness and weakness

## 2019-12-13 DIAGNOSIS — R69 Illness, unspecified: Secondary | ICD-10-CM | POA: Diagnosis not present

## 2019-12-15 ENCOUNTER — Encounter: Payer: Medicare HMO | Admitting: Family Medicine

## 2019-12-21 ENCOUNTER — Other Ambulatory Visit: Payer: Medicare HMO

## 2019-12-22 ENCOUNTER — Ambulatory Visit (INDEPENDENT_AMBULATORY_CARE_PROVIDER_SITE_OTHER): Payer: Medicare HMO | Admitting: Gastroenterology

## 2019-12-22 DIAGNOSIS — K5080 Crohn's disease of both small and large intestine without complications: Secondary | ICD-10-CM

## 2019-12-22 DIAGNOSIS — Z23 Encounter for immunization: Secondary | ICD-10-CM

## 2019-12-23 ENCOUNTER — Ambulatory Visit (HOSPITAL_COMMUNITY)
Admission: RE | Admit: 2019-12-23 | Discharge: 2019-12-23 | Disposition: A | Discharge status: Home / Self Care | Payer: Medicare HMO | Source: Ambulatory Visit | Attending: Cardiology | Admitting: Cardiology

## 2019-12-23 ENCOUNTER — Other Ambulatory Visit: Payer: Self-pay

## 2019-12-23 DIAGNOSIS — I119 Hypertensive heart disease without heart failure: Secondary | ICD-10-CM | POA: Diagnosis not present

## 2019-12-23 DIAGNOSIS — K219 Gastro-esophageal reflux disease without esophagitis: Secondary | ICD-10-CM | POA: Diagnosis not present

## 2019-12-23 DIAGNOSIS — E785 Hyperlipidemia, unspecified: Secondary | ICD-10-CM | POA: Diagnosis not present

## 2019-12-23 DIAGNOSIS — I420 Dilated cardiomyopathy: Secondary | ICD-10-CM | POA: Diagnosis not present

## 2019-12-23 DIAGNOSIS — I351 Nonrheumatic aortic (valve) insufficiency: Secondary | ICD-10-CM | POA: Diagnosis not present

## 2019-12-23 LAB — ECHOCARDIOGRAM COMPLETE
Area-P 1/2: 2.83 cm2
Calc EF: 39.8 %
P 1/2 time: 622 msec
S' Lateral: 3.9 cm
Single Plane A2C EF: 37.7 %
Single Plane A4C EF: 43.9 %

## 2019-12-23 NOTE — Progress Notes (Signed)
  Echocardiogram 2D Echocardiogram has been performed.  [name] 12/23/2019, 11:57 AM

## 2019-12-29 DIAGNOSIS — C8339 Diffuse large B-cell lymphoma, extranodal and solid organ sites: Secondary | ICD-10-CM | POA: Diagnosis not present

## 2019-12-29 DIAGNOSIS — E041 Nontoxic single thyroid nodule: Secondary | ICD-10-CM | POA: Diagnosis not present

## 2019-12-29 DIAGNOSIS — E042 Nontoxic multinodular goiter: Secondary | ICD-10-CM | POA: Diagnosis not present

## 2019-12-30 ENCOUNTER — Other Ambulatory Visit: Payer: Medicare HMO

## 2020-01-02 ENCOUNTER — Encounter: Payer: Medicare HMO | Admitting: Family Medicine

## 2020-01-10 NOTE — Patient Instructions (Addendum)
It was great to see you again today!  Please keep me posted about how you are feeling, we can plan to visit in about 6 months assuming all is well Will plan further follow- up pending labs.   Drs Krista Blue, [facility] and [facility] all have an interest in [place]- they are partners of Dr Jannifer Franklin   Try a strengthening nail treatment, and perhaps a biotin supplement for your nails Work on kegel exercises- at least 100 reps daily for a month or so.  If not helpful let me know and we can try a medication for you if you like  Pneumonia booster and flu shot today  Please see me in about 6 months and take care!

## 2020-01-10 NOTE — Progress Notes (Addendum)
[place] [facility] [address], [place], [place] [address] [phone]  Date:  01/12/2020   Name:  [name]   DOB:  1940-06-11   MRN:  [id number]  PCP:  Darreld Mclean, MD   Chief Complaint: Annual Exam (lab work, vitamin levels), Headache (frequent headaches "top of forehead"), Urinary Incontinence, and Referral (Neurology)   History of Present Illness:  [name] is a 79 y.o. very pleasant female patient who presents with the following:  Patient here today for follow-up visit- history of multiple sclerosis, osteopenia, diverticulitis, dilated cardiomyopathy, IBD.  She also has lymphoma which is being followed by Dr. Talbert Cage at [facility] Her husband Eduard Clos is also my patient  Last seen by myself in March of this year  Most recent oncology note reviewed, 9/16: IMPRESSION(S): [name] is a 79 y.o. 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 PET/CT after 3 cycles R-CHOP showed treatment response ([place] 3). On August 8, she completed 6 cycles of R-CHOP chemotherapy. End of treatment PET/CT scan performed on September 12 shows continued complete metabolic response. There were bilateral airspace opacities consistent with rituximab induced pneumonitis. Shortness of breath has improved with prednisone. In November 2019, she had diverticulitis which resolved. She also developed neutropenia but bone marrow examination on March 25, 2018 was unrevealing. By the end of December, her neutrophil count recovered. She has been in remission since.   PLAN(S): DLBCL dx 06/2017 : s/p 6 mini RCHOP and IT MTX, and IFRT radiation. Last PETCT 11/2018 showed CR. Labs look good today. Pt is clinically doing well in reference to lymphoma. ECOG 0  Rituximab induced pneumonitis per CT in 12/2017: Long resolved.   Multiple nodules /goiter per thyroid US 2020 and PETCT 11/2018   - Pt to follow up as needed with PCP or the doctor who ordered the thyroid ultrasound.   G1 UEs Neuropathy with MS hx with Left foot drop hx. She is followed by Neurology and most recent MRI was stable. She is not on any MS medications.   G1 diarrhea with Crohn's DZ. She has a gastroenterologist. She is not on any specific therapy for Crohn's and seems to be managing well.   Health maintenance. She had PPSV 23 in 06/2018. Will still need PCV 13 at PCP or next visit. Continue Covid precautions. Covid vaccine per guideline. Colonoscopy per GI. Mammogram / dexa per local PCP recommendations.    Due for Prevnar 13 Flu vaccine-due today Colonoscopy, mammogram up-to-date COVID series is complete including booster Consider lipids, b12, vit D, TSH   She enjoys walking for exercise, but she is not going to exercise classes due to covid She enjoyed yoga classes prior to the pandemic No CP or SOB with exercise   She has some mild urine incontinence - she has noted this for a few months, maybe 6 months This bothers her mostly because of odor, it is frustrating. Her neurologist is retiring and she would like to find a new provider She notes that her fingernails are weak and chipping a lot- the toes are ok Her nails are not painful  Vitamin D B12 TriCor Gabapentin Lovastatin Namenda Patient Active Problem List   Diagnosis Date Noted  . New onset headache 09/26/2019  . Dilated cardiomyopathy ([place]) 03/02/2019  . Osteopenia 12/25/2018  . Hypokalemia 02/28/2018  . Sepsis ([place]) 02/27/2018  . Diverticulitis 02/27/2018  . Diffuse  large B-cell lymphoma ([place]) 02/27/2018  . Osteoarthritis of cervical spine 09/15/2016  . Abnormality of gait 11/07/2015  . Dizziness and giddiness 11/07/2015  . Mild cognitive impairment 01/09/2015  . B12 deficiency 07/06/2014  . Leukopenia 05/06/2013  . Memory difficulty 01/05/2013  . Dyslipidemia 11/28/2008  . Multiple sclerosis ([place]) 11/28/2008  .  INFLAMMATORY BOWEL DISEASE 11/28/2008  . DIVERTICULOSIS, COLON 11/28/2008  . FATTY LIVER DISEASE 11/28/2008  . PERSONAL HISTORY OF PEPTIC ULCER DISEASE 11/28/2008    Past Medical History:  Diagnosis Date  . Allergy   . Arthritis, degenerative    both knees  . B12 deficiency 07/06/2014  . Cancer ([place])   . Colitis   . Diverticulosis   . Duodenitis   . Fatty liver   . Gastritis   . GERD (gastroesophageal reflux disease)   . Heart murmur   . Helicobacter pylori gastritis   . Hyperlipidemia   . Hypertension    not treated, pt reports "no"  . IBD (inflammatory bowel disease)   . Lymphoma ([place])   . Memory difficulty 01/05/2013  . Memory loss    mild  . Multiple sclerosis ([place])   . Osteopenia   . Vitamin B12 deficiency     Past Surgical History:  Procedure Laterality Date  . ABDOMINAL HYSTERECTOMY  2010  . BLADDER SURGERY  2010   Bladder Tack  . CERVICAL SPINE SURGERY    . CHOLECYSTECTOMY    . HEMORRHOID SURGERY    . ROTATOR CUFF REPAIR    . SPINAL FUSION    . TUBAL LIGATION      Social History   Tobacco Use  . Smoking status: Former Smoker    Types: Cigarettes    Quit date: 06/27/1997    Years since quitting: 22.5  . Smokeless tobacco: Never Used  Vaping Use  . Vaping Use: Never used  Substance Use Topics  . Alcohol use: No  . Drug use: No    Family History  Problem Relation Age of Onset  . Diabetes Father   . Heart disease Father   . Colon polyps Mother   . Heart failure Mother   . Breast cancer Cousin   . Colon cancer Maternal Grandmother        ? may be duodenal cancer  . Colon polyps Brother   . Hypertension Brother   . Pancreatic cancer Maternal Uncle   . Esophageal cancer Neg Hx   . Rectal cancer Neg Hx   . Stomach cancer Neg Hx     Allergies  Allergen Reactions  . Sulfa Antibiotics Hives and Nausea And Vomiting  . Vytorin [Ezetimibe-Simvastatin]     White skin patch  . Aubagio [Teriflunomide]     diarrhea  . Cephalexin   .  Doxycycline     Raw mouth   . Erythromycin     REACTION: weakness  . Macrobid [Nitrofurantoin Macrocrystal] Hives  . Morphine Nausea And Vomiting  . Rituxan [Rituximab]     Stated had issue with lungs after her last treatment  . Sulfonamide Derivatives Hives and Nausea And Vomiting  . Amoxicillin Hives    Medication list has been reviewed and updated.  Current Outpatient Medications on File Prior to Visit  Medication Sig Dispense Refill  . b complex vitamins tablet Take 1 tablet by mouth daily.    . Cholecalciferol (VITAMIN D3) 5000 units TABS Take 5,000 Units by mouth daily.     . cyanocobalamin (,VITAMIN B-12,) 1000 MCG/ML injection Inject 29m IM every 7  days x 4 doses then inject 46m IM every month 4 mL 11  . desonide (DESOWEN) 0.05 % lotion Apply topically 2 (two) times daily. Use as needed for itchy ears 59 mL 1  . fenofibrate (TRICOR) 145 MG tablet TAKE 1 TABLET BY MOUTH EVERY DAY 90 tablet 3  . gabapentin (NEURONTIN) 600 MG tablet Take 1 tablet (600 mg total) by mouth 3 (three) times daily. 270 tablet 3  . Glucosamine 500 MG TABS Take 1 tablet by mouth 2 (two) times daily.     .[name] Kitchenloratadine (CLARITIN) 10 MG tablet Take 1 tablet (10 mg total) by mouth daily as needed for allergies.    .[name] Kitchenlovastatin (MEVACOR) 40 MG tablet Take 1 tablet (40 mg total) by mouth 2 (two) times daily. 180 tablet 3  . memantine (NAMENDA) 10 MG tablet TAKE 1 TABLET BY MOUTH TWICE A DAY 180 tablet 2  . Multiple Vitamin (MULTIVITAMIN) tablet Take 1 tablet by mouth daily.     . Probiotic Product (ALIGN PO) Take 1 tablet by mouth daily.     . Syringe/Needle, Disp, (SYRINGE 3CC/25GX1") 25G X 1" 3 ML MISC Use with B12 injections 4 each 11   No current facility-administered medications on file prior to visit.    Review of Systems:  As per HPI- otherwise negative.   Physical Examination: Vitals:   01/12/20 0859  BP: 122/76  Pulse: 99  Resp: 15  SpO2: 97%   Vitals:   01/12/20 0859  Weight: 134 lb  (60.8 kg)  Height: 5' 5.5" (1.664 m)   Body mass index is 21.96 kg/m. Ideal Body Weight: Weight in (lb) to have BMI = 25: 152.2  GEN: no acute distress.  Slender build, looks well HEENT: Atraumatic, Normocephalic.   Bilateral TM wnl, oropharynx normal.  PEERL,EOMI.   Ears and Nose: No external deformity. CV: RRR, No M/G/R. No JVD. No thrill. No extra heart sounds. PULM: CTA B, no wheezes, crackles, rhonchi. No retractions. No resp. distress. No accessory muscle use. ABD: S, NT, ND, +BS. No rebound. No HSM. EXTR: No c/c/e PSYCH: Normally interactive. Conversant.  Fingernails are somewhat thin and weak   Assessment and Plan: Hyperlipidemia, unspecified hyperlipidemia type - Plan: lovastatin (MEVACOR) 40 MG tablet  Multiple sclerosis (H[facility])  Lymphoma of solid organ excluding spleen, unspecified lymphoma type ([facility]  Crohn's disease without complication, unspecified gastrointestinal tract location ([place]  Dyslipidemia - Plan: Lipid panel  Urinary incontinence, unspecified type - Plan: Urine Culture  Immunization due - Plan: Pneumococcal conjugate vaccine 13-valent IM  B12 deficiency - Plan: Vitamin B12  Vitamin D deficiency - Plan: VITAMIN D 25 Hydroxy (Vit-D Deficiency, Fractures)  Thyroid nodule - Plan: TSH  Needs flu shot - Plan: Flu Vaccine QUAD High Dose(Fluad)  Patient today for a follow-up visit Her neurologist is retiring, I suggested 3 other physicians at her current neurology practice who have an interest in MS Refill medication as above Prevnar, flu vaccine given Suggested a strengthening nail polish and/or biotin supplement.  Likely her nails have changed due to cancer treatment We discussed urinary incontinence.  It sounds as though she has stress incontinence, likely due to aging and pelvic floor weakness.  Recommended that she try Kegel exercises for about a month.  If this is not helpful, we can also try medication. Will obtain ucx to rule out UTI This visit  occurred during the SARS-CoV-2 public health emergency.  Safety protocols were in place, including screening questions prior to the visit, additional usage of  staff PPE, and extensive cleaning of exam room while observing appropriate contact time as indicated for disinfecting solutions.    Signed Lamar Blinks, MD  Addnd 10/3- received her urine culture as below- other labs are still pending  Results for orders placed or performed in visit on 01/12/20  Urine Culture   Specimen: Urine  Result Value Ref Range   MICRO NUMBER: [id number]    SPECIMEN QUALITY: Adequate    Sample Source NOT GIVEN    STATUS: FINAL    ISOLATE 1: Escherichia coli (A)       Susceptibility   Escherichia coli - URINE CULTURE, REFLEX    AMOX/CLAVULANIC <=2 Sensitive     AMPICILLIN <=2 Sensitive     AMPICILLIN/SULBACTAM <=2 Sensitive     CEFAZOLIN* <=4 Not Reportable      * For infections other than uncomplicated UTIcaused by E. coli, K. pneumoniae or P. mirabilis:Cefazolin is resistant if MIC > or = 8 mcg/mL.(Distinguishing susceptible versus intermediatefor isolates with MIC < or = 4 mcg/mL requiresadditional testing.)For uncomplicated UTI caused by E. coli,K. pneumoniae or P. mirabilis: Cefazolin issusceptible if MIC <32 mcg/mL and predictssusceptible to the oral agents cefaclor, cefdinir,cefpodoxime, cefprozil, cefuroxime, cephalexinand loracarbef.    CEFEPIME <=1 Sensitive     CEFTRIAXONE <=1 Sensitive     CIPROFLOXACIN <=0.25 Sensitive     LEVOFLOXACIN <=0.12 Sensitive     ERTAPENEM <=0.5 Sensitive     GENTAMICIN <=1 Sensitive     IMIPENEM <=0.25 Sensitive     NITROFURANTOIN <=16 Sensitive     PIP/TAZO <=4 Sensitive     TOBRAMYCIN <=1 Sensitive     TRIMETH/SULFA* <=20 Sensitive      * For infections other than uncomplicated UTIcaused by E. coli, K. pneumoniae or P. mirabilis:Cefazolin is resistant if MIC > or = 8 mcg/mL.(Distinguishing susceptible versus intermediatefor isolates with MIC < or = 4 mcg/mL  requiresadditional testing.)For uncomplicated UTI caused by E. coli,K. pneumoniae or P. mirabilis: Cefazolin issusceptible if MIC <32 mcg/mL and predictssusceptible to the oral agents cefaclor, cefdinir,cefpodoxime, cefprozil, cefuroxime, cephalexinand loracarbef.Legend:S = Susceptible  I = IntermediateR = Resistant  NS = Not susceptible* = Not tested  NR = Not reported**NN = See antimicrobic comments

## 2020-01-12 ENCOUNTER — Encounter: Payer: Self-pay | Admitting: Family Medicine

## 2020-01-12 ENCOUNTER — Other Ambulatory Visit: Payer: Self-pay

## 2020-01-12 ENCOUNTER — Ambulatory Visit (INDEPENDENT_AMBULATORY_CARE_PROVIDER_SITE_OTHER): Payer: Medicare HMO | Admitting: Family Medicine

## 2020-01-12 VITALS — BP 122/76 | HR 99 | Resp 15 | Ht 65.5 in | Wt 134.0 lb

## 2020-01-12 DIAGNOSIS — E041 Nontoxic single thyroid nodule: Secondary | ICD-10-CM

## 2020-01-12 DIAGNOSIS — E785 Hyperlipidemia, unspecified: Secondary | ICD-10-CM | POA: Diagnosis not present

## 2020-01-12 DIAGNOSIS — G35 Multiple sclerosis: Secondary | ICD-10-CM | POA: Diagnosis not present

## 2020-01-12 DIAGNOSIS — C8599 Non-Hodgkin lymphoma, unspecified, extranodal and solid organ sites: Secondary | ICD-10-CM

## 2020-01-12 DIAGNOSIS — R32 Unspecified urinary incontinence: Secondary | ICD-10-CM | POA: Diagnosis not present

## 2020-01-12 DIAGNOSIS — E559 Vitamin D deficiency, unspecified: Secondary | ICD-10-CM | POA: Diagnosis not present

## 2020-01-12 DIAGNOSIS — Z23 Encounter for immunization: Secondary | ICD-10-CM | POA: Diagnosis not present

## 2020-01-12 DIAGNOSIS — E538 Deficiency of other specified B group vitamins: Secondary | ICD-10-CM

## 2020-01-12 DIAGNOSIS — K5090 Crohn's disease, unspecified, without complications: Secondary | ICD-10-CM

## 2020-01-12 MED ORDER — LOVASTATIN 40 MG PO TABS: 40.0000 mg | ORAL_TABLET | Freq: Two times a day (BID) | ORAL | 3 refills | Status: DC

## 2020-01-12 MED ORDER — LORAZEPAM 0.5 MG PO TABS: 0.5000 mg | ORAL_TABLET | Freq: Three times a day (TID) | ORAL | 0 refills | Status: DC

## 2020-01-14 LAB — URINE CULTURE
MICRO NUMBER:: 11015721
SPECIMEN QUALITY:: ADEQUATE

## 2020-01-15 ENCOUNTER — Encounter: Payer: Self-pay | Admitting: Family Medicine

## 2020-01-15 DIAGNOSIS — N3000 Acute cystitis without hematuria: Secondary | ICD-10-CM

## 2020-01-16 MED ORDER — CIPROFLOXACIN HCL 250 MG PO TABS: 250.0000 mg | ORAL_TABLET | Freq: Two times a day (BID) | ORAL | 0 refills | Status: DC

## 2020-01-19 IMAGING — CR DG CHEST 2V
2 series · 2 of 2 positions shown · non-contrast
Comparison: Chest x-ray 02/25/2007.

CLINICAL DATA: Cough.

EXAM:
CHEST  2 VIEW

[w chest pa]
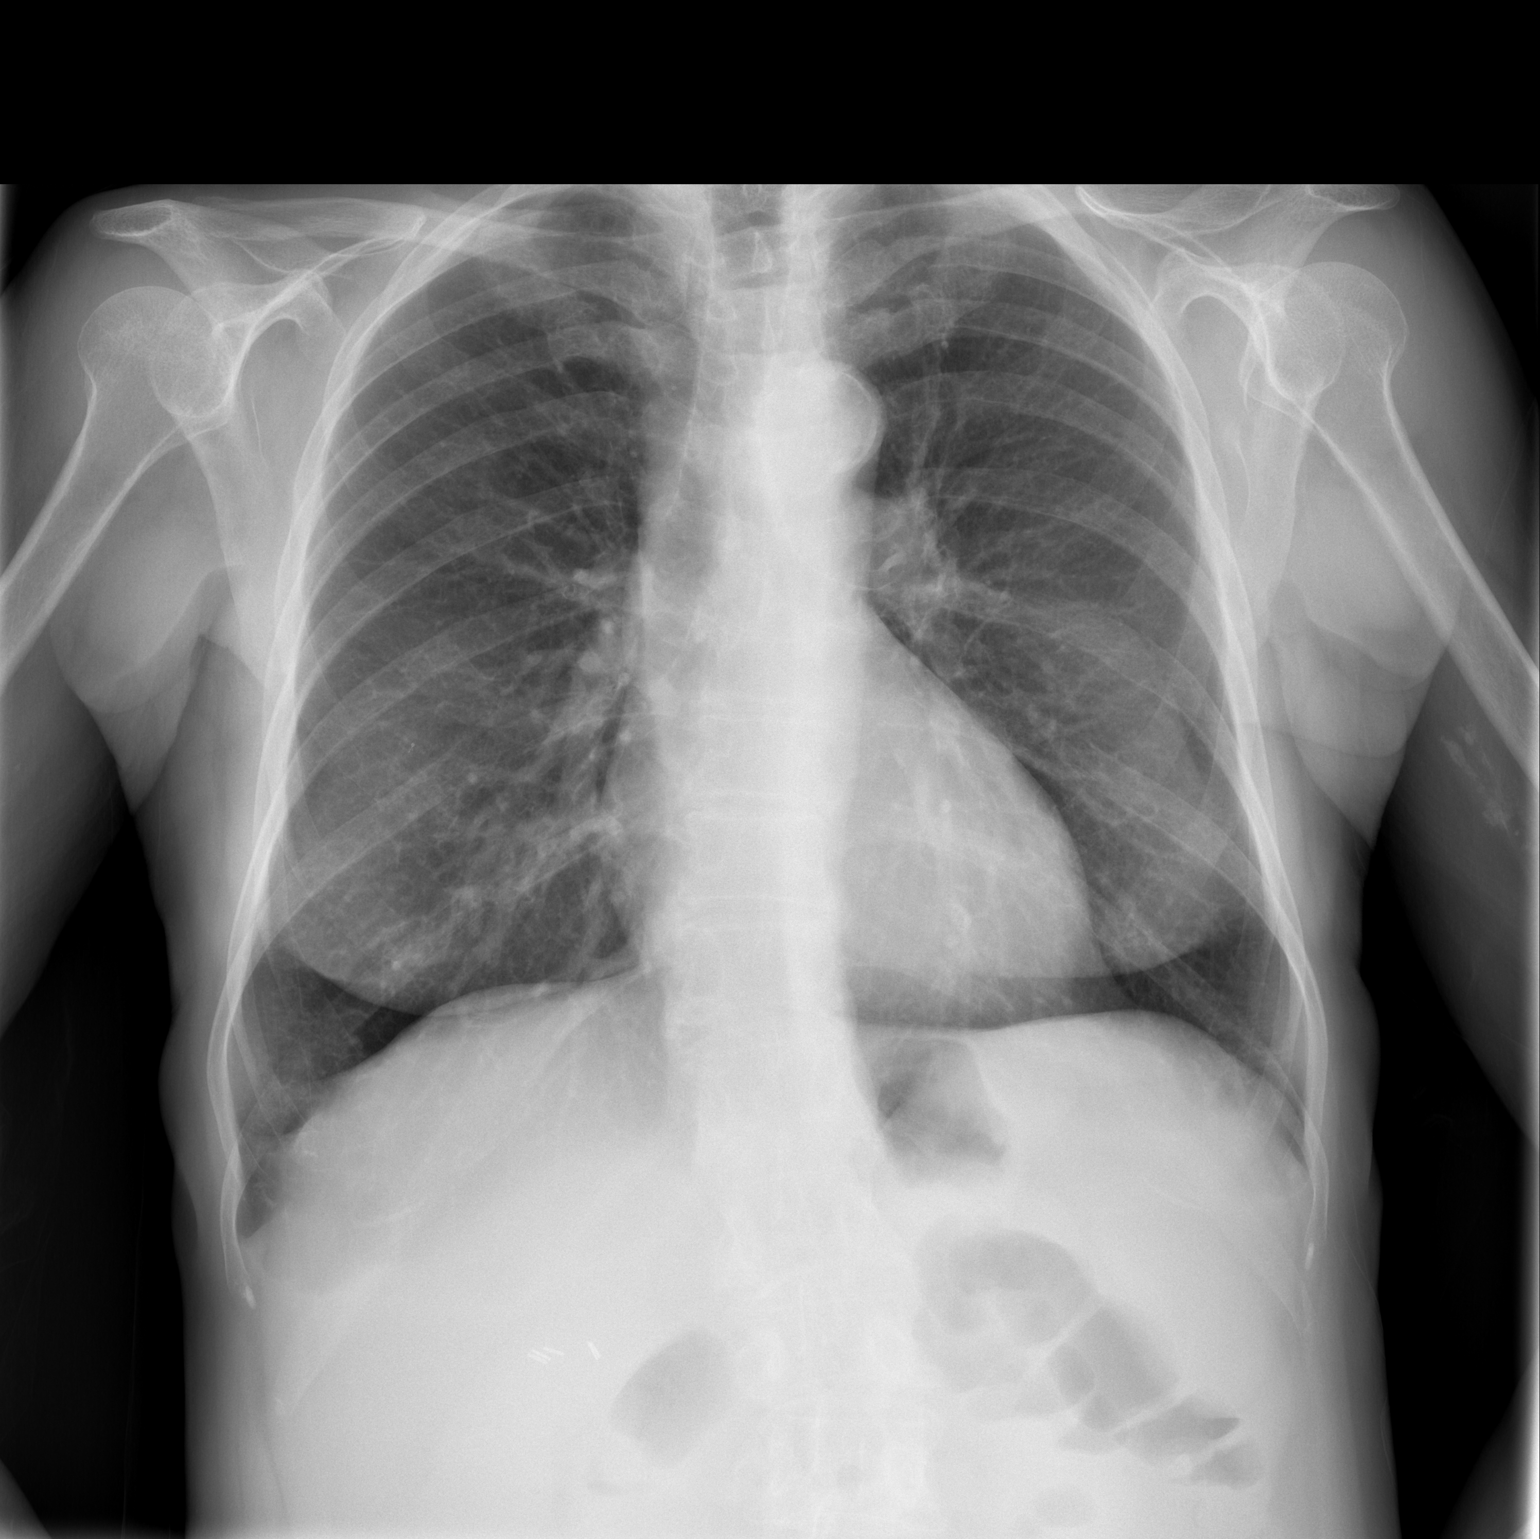

[w chest lat]
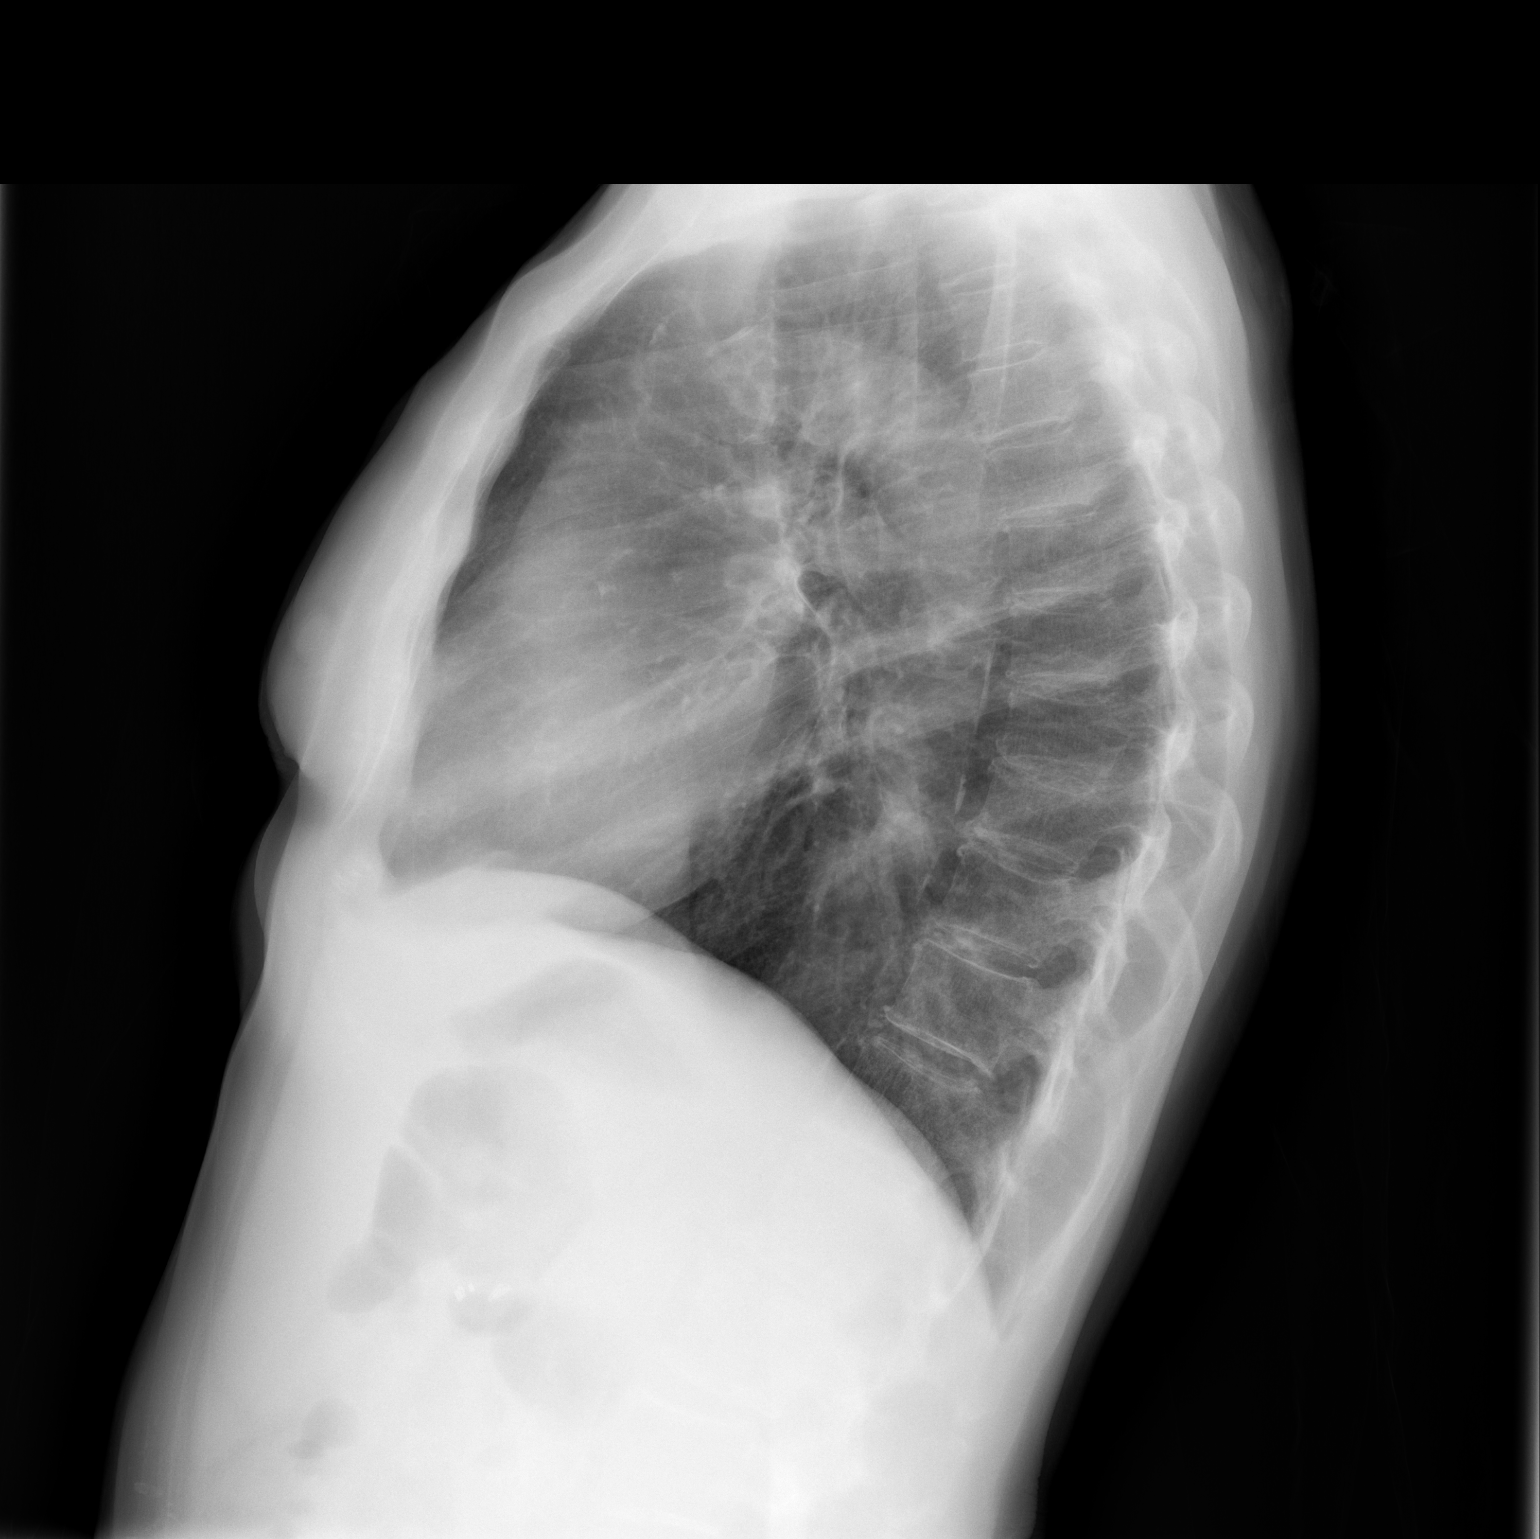

[2 of 2 positions shown; findings below may reference images not displayed]

FINDINGS: Mediastinum hilar structures normal. Lungs are clear. No pleural
effusion or pneumothorax. Thoracic spine scoliosis. Prior
cervicothoracic spine fusion.
IMPRESSION: No acute cardiopulmonary disease.

## 2020-01-21 DIAGNOSIS — R69 Illness, unspecified: Secondary | ICD-10-CM | POA: Diagnosis not present

## 2020-01-23 NOTE — Telephone Encounter (Signed)
Tried calling patient, no answer. Left message on voicemail to return call and schedule appt.

## 2020-01-24 ENCOUNTER — Other Ambulatory Visit: Payer: Self-pay

## 2020-01-24 ENCOUNTER — Other Ambulatory Visit: Payer: Medicare HMO

## 2020-01-24 DIAGNOSIS — E785 Hyperlipidemia, unspecified: Secondary | ICD-10-CM | POA: Diagnosis not present

## 2020-01-24 DIAGNOSIS — E538 Deficiency of other specified B group vitamins: Secondary | ICD-10-CM

## 2020-01-24 DIAGNOSIS — E041 Nontoxic single thyroid nodule: Secondary | ICD-10-CM

## 2020-01-24 DIAGNOSIS — N3000 Acute cystitis without hematuria: Secondary | ICD-10-CM | POA: Diagnosis not present

## 2020-01-24 DIAGNOSIS — E559 Vitamin D deficiency, unspecified: Secondary | ICD-10-CM | POA: Diagnosis not present

## 2020-01-24 NOTE — Addendum Note (Signed)
Addended by: Kelle Darting A on: 01/24/2020 10:32 AM   Modules accepted: Orders

## 2020-01-25 ENCOUNTER — Encounter: Payer: Self-pay | Admitting: Family Medicine

## 2020-01-25 ENCOUNTER — Other Ambulatory Visit: Payer: Self-pay | Admitting: Family Medicine

## 2020-01-25 DIAGNOSIS — E785 Hyperlipidemia, unspecified: Secondary | ICD-10-CM

## 2020-01-25 LAB — LIPID PANEL
Cholesterol: 112 mg/dL (ref <200)
HDL: 37 mg/dL — ABNORMAL LOW (ref >=50)
LDL Cholesterol (Calc): 42 mg/dL (calc)
Non-HDL Cholesterol (Calc): 75 mg/dL (calc) (ref <130)
Total CHOL/HDL Ratio: 3 (calc) (ref <5.0)
Triglycerides: 315 mg/dL — ABNORMAL HIGH (ref <150)

## 2020-01-25 LAB — TSH: TSH: 1.19 mIU/L (ref 0.40–4.50)

## 2020-01-25 LAB — URINE CULTURE
MICRO NUMBER:: 11061102
Result:: NO GROWTH
SPECIMEN QUALITY:: ADEQUATE

## 2020-01-25 LAB — VITAMIN D 25 HYDROXY (VIT D DEFICIENCY, FRACTURES): Vit D, 25-Hydroxy: 41 ng/mL (ref 30–100)

## 2020-01-25 LAB — VITAMIN B12: Vitamin B-12: 679 pg/mL (ref 200–1100)

## 2020-01-25 MED ORDER — ROSUVASTATIN CALCIUM 20 MG PO TABS: 20.0000 mg | ORAL_TABLET | Freq: Every day | ORAL | 3 refills | Status: DC

## 2020-01-26 ENCOUNTER — Encounter: Payer: Self-pay | Admitting: Family Medicine

## 2020-01-31 ENCOUNTER — Other Ambulatory Visit: Payer: Self-pay

## 2020-01-31 ENCOUNTER — Ambulatory Visit: Payer: Medicare HMO | Admitting: Neurology

## 2020-01-31 ENCOUNTER — Encounter: Payer: Self-pay | Admitting: Neurology

## 2020-01-31 VITALS — BP 136/80 | HR 94 | Ht 65.5 in | Wt 134.0 lb

## 2020-01-31 DIAGNOSIS — R413 Other amnesia: Secondary | ICD-10-CM

## 2020-01-31 DIAGNOSIS — G35D Multiple sclerosis, unspecified: Secondary | ICD-10-CM

## 2020-01-31 DIAGNOSIS — R519 Headache, unspecified: Secondary | ICD-10-CM

## 2020-01-31 DIAGNOSIS — G35 Multiple sclerosis: Secondary | ICD-10-CM | POA: Diagnosis not present

## 2020-01-31 MED ORDER — UBRELVY 50 MG PO TABS: 50.0000 mg | ORAL_TABLET | ORAL | 11 refills | Status: DC | PRN

## 2020-01-31 MED ORDER — MEMANTINE HCL 10 MG PO TABS: 10.0000 mg | ORAL_TABLET | Freq: Two times a day (BID) | ORAL | 2 refills | Status: DC

## 2020-01-31 MED ORDER — GABAPENTIN 600 MG PO TABS: 600.0000 mg | ORAL_TABLET | Freq: Three times a day (TID) | ORAL | 3 refills | Status: DC

## 2020-01-31 NOTE — Progress Notes (Signed)
I have read the note, and I agree with the clinical assessment and plan.  [name]   

## 2020-01-31 NOTE — Progress Notes (Signed)
PATIENT: [name] DOB: 1940-10-08  REASON FOR VISIT: follow up HISTORY FROM: patient  HISTORY OF PRESENT ILLNESS: Today 01/31/20 [name] is a 79 year old female with history of multiple sclerosis.  In 2020, she was treated for lymphoma with rituximab, was taken off Gilenya for MS.  Started Aubagio, but could not tolerate.  Dr. Jannifer Franklin has suggested she go back on either Gilenya, Mayzent, or Copaxone.  She has decided not to go on any MS medications.  She has been off for about 1 year.  MS is overall stable, takes gabapentin for generalized achy pain.  Denies changes to his vision, balance, gait, B/B, no falls. If she goes back on MS meds, it would probably be Copaxone. Has history of Crohn's disease.  For last several months, complained of frequent headache, right frontal area, around hairline, no migraine features or nausea. May happen as frequent as once a week, maybe not that.  No trigger or patterns.  Could be anytime a day. Will take Tylenol, lie down, usually does not completely relieve, unless goes to sleep.  ESR, CRP were normal, MRI of the brain with and without contrast in June 2021 was stable, no acute findings.  History of migraines with menstrual cycles.  Is on Namenda for memory, is overall stable.  Recently treated for UTI, has now resolved.  Here today for follow-up unaccompanied.  HISTORY 09/26/2019 SS: [name] is a 79 year old female with history of multiple sclerosis.  In 2020, she was treated for lymphoma, she received rituximab, was taken off Gilenya.  She started Aubagio, but could not tolerate the medication.  Dr. Eugenie Birks suggested she either go back on Gilenya or try Mayzent, or Copaxone.  She has history of Crohn's disease.  She had MRI of the brain in May 2019, no enhancing lesions were noted, overall no significant change compared to prior MRI in August 2017.  She overall decided not to resume MS medication.  Indicates MS is overall stable, she may have  occasional urinary leakage or bowel but she has Crohn's disease.  Denies any new weakness to the arms or legs.  Indicates a several month new history of onset of headache, occurs to the right frontal area, can be intense, as frequent as 2-3 headaches a week or may go 2 weeks without anything, takes Tylenol without much benefit.  Denies nausea, photophobia, phonophobia.  Is significant enough where it ruins her day, will lie down in the bed.  No other associated symptoms.  Denies any visual disturbance.  Actually went to see her dermatologist about this, they didn't find anything.  Her lymphoma remains stable.  She remains on gabapentin for achy pain and fatigue, and Namenda for memory.  Memory is overall stable.  She presents today for evaluation accompanied by her husband.   REVIEW OF SYSTEMS: Out of a complete 14 system review of symptoms, the patient complains only of the following symptoms, and all other reviewed systems are negative.  Headache  ALLERGIES: Allergies  Allergen Reactions  . Sulfa Antibiotics Hives and Nausea And Vomiting  . Vytorin [Ezetimibe-Simvastatin]     White skin patch  . Aubagio [Teriflunomide]     diarrhea  . Cephalexin   . Doxycycline     Raw mouth   . Erythromycin     REACTION: weakness  . Macrobid [Nitrofurantoin Macrocrystal] Hives  . Morphine Nausea And Vomiting  . Rituxan [Rituximab]     Stated had issue with lungs after her last treatment  .  Sulfonamide Derivatives Hives and Nausea And Vomiting  . Amoxicillin Hives    HOME MEDICATIONS: Outpatient Medications Prior to Visit  Medication Sig Dispense Refill  . b complex vitamins tablet Take 1 tablet by mouth daily.    . Cholecalciferol (VITAMIN D3) 5000 units TABS Take 5,000 Units by mouth daily.     . cyanocobalamin (,VITAMIN B-12,) 1000 MCG/ML injection Inject 53m IM every 7 days x 4 doses then inject 165mIM every month 4 mL 11  . desonide (DESOWEN) 0.05 % lotion Apply topically 2 (two) times daily.  Use as needed for itchy ears 59 mL 1  . Glucosamine 500 MG TABS Take 1 tablet by mouth 2 (two) times daily.     . [name] Kitchenoratadine (CLARITIN) 10 MG tablet Take 1 tablet (10 mg total) by mouth daily as needed for allergies.    . [name] KitchenORazepam (ATIVAN) 0.5 MG tablet Take 1 tablet (0.5 mg total) by mouth every 8 (eight) hours. Use as needed for anxiety 30 tablet 0  . Multiple Vitamin (MULTIVITAMIN) tablet Take 1 tablet by mouth daily.     . Probiotic Product (ALIGN PO) Take 1 tablet by mouth daily.     . rosuvastatin (CRESTOR) 20 MG tablet Take 1 tablet (20 mg total) by mouth daily. 90 tablet 3  . Syringe/Needle, Disp, (SYRINGE 3CC/25GX1") 25G X 1" 3 ML MISC Use with B12 injections 4 each 11  . gabapentin (NEURONTIN) 600 MG tablet Take 1 tablet (600 mg total) by mouth 3 (three) times daily. 270 tablet 3  . memantine (NAMENDA) 10 MG tablet TAKE 1 TABLET BY MOUTH TWICE A DAY 180 tablet 2  . fenofibrate (TRICOR) 145 MG tablet TAKE 1 TABLET BY MOUTH EVERY DAY 90 tablet 3   No facility-administered medications prior to visit.    PAST MEDICAL HISTORY: Past Medical History:  Diagnosis Date  . Allergy   . Arthritis, degenerative    both knees  . B12 deficiency 07/06/2014  . Cancer ([facility]  . Colitis   . Diverticulosis   . Duodenitis   . Fatty liver   . Gastritis   . GERD (gastroesophageal reflux disease)   . Heart murmur   . Helicobacter pylori gastritis   . Hyperlipidemia   . Hypertension    not treated, pt reports "no"  . IBD (inflammatory bowel disease)   . Lymphoma ([facility]  . Memory difficulty 01/05/2013  . Memory loss    mild  . Multiple sclerosis ([facility] [place]  . Osteopenia   . Vitamin B12 deficiency     PAST SURGICAL HISTORY: Past Surgical History:  Procedure Laterality Date  . ABDOMINAL HYSTERECTOMY  2010  . BLADDER SURGERY  2010   Bladder Tack  . CERVICAL SPINE SURGERY    . CHOLECYSTECTOMY    . HEMORRHOID SURGERY    . ROTATOR CUFF REPAIR    . SPINAL FUSION    . TUBAL LIGATION       FAMILY HISTORY: Family History  Problem Relation Age of Onset  . Diabetes Father   . Heart disease Father   . Colon polyps Mother   . Heart failure Mother   . Breast cancer Cousin   . Colon cancer Maternal Grandmother        ? may be duodenal cancer  . Colon polyps Brother   . Hypertension Brother   . Pancreatic cancer Maternal Uncle   . Esophageal cancer Neg Hx   . Rectal cancer Neg Hx   . Stomach cancer Neg  Hx     SOCIAL HISTORY: Social History   Socioeconomic History  . Marital status: Married    Spouse name: Eduard Clos   . Number of children: 4  . Years of education: 12+  . Highest education level: Not on file  Occupational History  . 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 Occupation: part-time  Tobacco Use  . Smoking status: Former Smoker    Types: Cigarettes    Quit date: 06/27/1997    Years since quitting: 22.6  . Smokeless tobacco: Never Used  Vaping Use  . Vaping Use: Never used  Substance and Sexual Activity  . Alcohol use: No  . Drug use: No  . Sexual activity: Not Currently  Other Topics Concern  . Not on file  Social History Narrative   Caffeine daily.    Patient lives at home with husband Eduard Clos but goes by [employer].    Patient has 4 children.    Patient has 1 year of college.    Patient is retired.    Patient is left handed.    Patient drinks 2 cups of coffee per day.   Social Determinants of Health   Financial Resource Strain: Low Risk   . Difficulty of Paying Living Expenses: Not hard at all  Food Insecurity: No Food Insecurity  . Worried About Charity fundraiser in the Last Year: Never true  . Ran Out of Food in the Last Year: Never true  Transportation Needs: No Transportation Needs  . Lack of Transportation (Medical): No  . Lack of Transportation (Non-Medical): No  Physical Activity:   . Days of Exercise per Week: Not on file  . Minutes of Exercise per Session: Not on file  Stress:   . Feeling  of Stress : Not on file  Social Connections:   . Frequency of Communication with Friends and Family: Not on file  . Frequency of Social Gatherings with Friends and Family: Not on file  . Attends Religious Services: Not on file  . Active Member of Clubs or Organizations: Not on file  . Attends Archivist Meetings: Not on file  . Marital Status: Not on file  Intimate Partner Violence:   . Fear of Current or Ex-Partner: Not on file  . Emotionally Abused: Not on file  . Physically Abused: Not on file  . Sexually Abused: Not on file   PHYSICAL EXAM  Vitals:   01/31/20 0841  BP: 136/80  Pulse: 94  Weight: 134 lb (60.8 kg)  Height: 5' 5.5" (1.664 m)   Body mass index is 21.96 kg/m.  Generalized: Well developed, in no acute distress  MMSE - Mini Mental State Exam 01/31/2020 10/14/2018 01/02/2017  Orientation to time _0 Orientation to Place _1 Registration _2 Attention/ Calculation _3 Recall _4 Language- name 2 objects _5 Language- repeat _6 Language- follow 3 step command _7 Language- read & follow direction _8 Write a sentence _9 Write a sentence-comments - - -  Copy design 0 1 1  Total score _10 Neurological examination  Mentation: Alert oriented to time, place, history taking. Follows all commands speech and language fluent Cranial nerve II-XII: Pupils were equal round reactive to light. Extraocular movements were full, visual field were full on confrontational test.  Facial sensation and strength were normal. Head turning and shoulder shrug  were normal and symmetric. Motor: Good strength all extremities Sensory: Sensory testing is intact to soft touch on all 4 extremities. No evidence of extinction is noted.  Coordination: Cerebellar testing reveals good finger-nose-finger and heel-to-shin bilaterally.  Gait and station: Gait is normal. Tandem gait is slightly unsteady. Romberg is negative. No drift is seen.   Reflexes: Deep tendon reflexes are symmetric and normal bilaterally.   DIAGNOSTIC DATA (LABS, IMAGING, TESTING) - I reviewed patient records, labs, notes, testing and imaging myself where available.  Lab Results  Component Value Date   WBC 3.8 09/26/2019   HGB 12.9 09/26/2019   HCT 39.1 09/26/2019   MCV 98 (H) 09/26/2019   PLT 195 09/26/2019      Component Value Date/Time   NA 139 09/26/2019 1354   NA 141 05/06/2013 0926   K 4.0 09/26/2019 1354   K 3.9 05/06/2013 0926   CL 102 09/26/2019 1354   CO2 20 09/26/2019 1354   CO2 26 05/06/2013 0926   GLUCOSE 132 (H) 09/26/2019 1354   GLUCOSE 76 06/10/2019 0920   GLUCOSE 119 05/06/2013 0926   BUN 23 09/26/2019 1354   BUN 21.6 05/06/2013 0926   CREATININE 1.04 (H) 09/26/2019 1354   CREATININE 0.8 05/06/2013 0926   CALCIUM 9.6 09/26/2019 1354   CALCIUM 10.0 05/06/2013 0926   PROT 6.9 09/26/2019 1354   PROT 7.7 05/06/2013 0926   ALBUMIN 4.2 09/26/2019 1354   ALBUMIN 4.1 05/06/2013 0926   AST 34 09/26/2019 1354   AST 24 05/06/2013 0926   ALT 14 09/26/2019 1354   ALT 16 05/06/2013 0926   ALKPHOS 61 09/26/2019 1354   ALKPHOS 49 05/06/2013 0926   BILITOT 0.4 09/26/2019 1354   BILITOT 0.58 05/06/2013 0926   GFRNONAA 52 (L) 09/26/2019 1354   GFRAA 60 09/26/2019 1354   Lab Results  Component Value Date   CHOL 112 01/24/2020   HDL 37 (L) 01/24/2020   LDLCALC 42 01/24/2020   LDLDIRECT 37.0 12/13/2018   TRIG 315 (H) 01/24/2020   CHOLHDL 3.0 01/24/2020   Lab Results  Component Value Date   HGBA1C 6.0 12/13/2018   Lab Results  Component Value Date   VITAMINB12 679 01/24/2020   Lab Results  Component Value Date   TSH 1.19 01/24/2020   ASSESSMENT AND PLAN 79 y.o. year old female  has a past medical history of Allergy, Arthritis, degenerative, B12 deficiency (07/06/2014), Cancer ([place]), Colitis, Diverticulosis, Duodenitis, Fatty liver, Gastritis, GERD (gastroesophageal reflux disease), Heart murmur, Helicobacter pylori  gastritis, Hyperlipidemia, Hypertension, IBD (inflammatory bowel disease), Lymphoma ([place]), Memory difficulty (01/05/2013), Memory loss, Multiple sclerosis ([place]), Osteopenia, and Vitamin B12 deficiency. here with:  1.  Multiple sclerosis 2.  History of lymphoma -Overall stable, has been off MS medications for about 1 year -Wants to stay off for now, may consider Copaxone in future -MRI of the brain with and without contrast in June 2021 was overall stable, no acute abnormalities -Continue gabapentin 600 mg 3 times a day -Will let me know if MS symptoms increase or anything new -Return in 5 months with Dr. Jannifer Franklin, wants to discuss next neurologist when he retires  3.  New onset headache -ESR, CRP were normal -Doesn't desire daily preventative medication, no migraine features, MRI no acute abnormalities -Will try Ubrelvy 50 mg tablet for acute headache -Hold triptan due to age, could consider Fioricet   4.  Reported mild memory disturbance -Continue Namenda 10 mg  twice a day -MMSE 25/30  I spent 30 minutes of face-to-face and non-face-to-face time with patient.  This included previsit chart review, lab review, study review, order entry, electronic health record documentation, patient education.  Butler Denmark, AGNP-C, DNP 01/31/2020, 9:32 AM [facility] [address], [place], [place] [address] [phone]

## 2020-01-31 NOTE — Patient Instructions (Signed)
Continue current medications Try the Ubrelvy for acute headache, take 1 at onset of headache, may repeat once 2 hours after initial dosing Call me if headaches aren't improving or any new MS symptoms  See you back in 5 months

## 2020-02-01 ENCOUNTER — Telehealth: Payer: Self-pay | Admitting: Neurology

## 2020-02-01 NOTE — Telephone Encounter (Signed)
Received a PA request for Ubrelvy 60m. PA started on [email or web address] [place] is [id number]. Per [email or web address], a determination will be made within 2-3 business days. Will check back later for a response.

## 2020-02-01 NOTE — Telephone Encounter (Signed)
Received fax from insurance. PA has been approved 04/15/19 to 04/13/20. Will send a copy of the determination to pharmacy.

## 2020-02-13 ENCOUNTER — Emergency Department (HOSPITAL_BASED_OUTPATIENT_CLINIC_OR_DEPARTMENT_OTHER): Payer: Medicare HMO

## 2020-02-13 ENCOUNTER — Emergency Department (HOSPITAL_BASED_OUTPATIENT_CLINIC_OR_DEPARTMENT_OTHER)
Admission: EM | Admit: 2020-02-13 | Discharge: 2020-02-13 | Disposition: A | Discharge status: Home / Self Care | Payer: Medicare HMO | Attending: Emergency Medicine | Admitting: Emergency Medicine

## 2020-02-13 ENCOUNTER — Ambulatory Visit (INDEPENDENT_AMBULATORY_CARE_PROVIDER_SITE_OTHER): Payer: Medicare HMO | Admitting: Internal Medicine

## 2020-02-13 ENCOUNTER — Other Ambulatory Visit: Payer: Self-pay

## 2020-02-13 ENCOUNTER — Encounter (HOSPITAL_BASED_OUTPATIENT_CLINIC_OR_DEPARTMENT_OTHER): Payer: Self-pay

## 2020-02-13 ENCOUNTER — Encounter: Payer: Self-pay | Admitting: Internal Medicine

## 2020-02-13 VITALS — BP 104/70 | HR 121 | Temp 97.9°F | Resp 18 | Ht 65.5 in | Wt 134.0 lb

## 2020-02-13 DIAGNOSIS — R079 Chest pain, unspecified: Secondary | ICD-10-CM

## 2020-02-13 DIAGNOSIS — Z79899 Other long term (current) drug therapy: Secondary | ICD-10-CM | POA: Insufficient documentation

## 2020-02-13 DIAGNOSIS — R5381 Other malaise: Secondary | ICD-10-CM | POA: Insufficient documentation

## 2020-02-13 DIAGNOSIS — R0789 Other chest pain: Secondary | ICD-10-CM | POA: Diagnosis not present

## 2020-02-13 DIAGNOSIS — Z20822 Contact with and (suspected) exposure to covid-19: Secondary | ICD-10-CM | POA: Insufficient documentation

## 2020-02-13 DIAGNOSIS — E041 Nontoxic single thyroid nodule: Secondary | ICD-10-CM | POA: Diagnosis not present

## 2020-02-13 DIAGNOSIS — R000 Tachycardia, unspecified: Secondary | ICD-10-CM | POA: Insufficient documentation

## 2020-02-13 DIAGNOSIS — Z859 Personal history of malignant neoplasm, unspecified: Secondary | ICD-10-CM | POA: Diagnosis not present

## 2020-02-13 DIAGNOSIS — I959 Hypotension, unspecified: Secondary | ICD-10-CM | POA: Diagnosis not present

## 2020-02-13 DIAGNOSIS — I10 Essential (primary) hypertension: Secondary | ICD-10-CM | POA: Insufficient documentation

## 2020-02-13 DIAGNOSIS — R0602 Shortness of breath: Secondary | ICD-10-CM | POA: Diagnosis not present

## 2020-02-13 DIAGNOSIS — R42 Dizziness and giddiness: Secondary | ICD-10-CM | POA: Insufficient documentation

## 2020-02-13 DIAGNOSIS — E042 Nontoxic multinodular goiter: Secondary | ICD-10-CM | POA: Diagnosis not present

## 2020-02-13 DIAGNOSIS — Z87891 Personal history of nicotine dependence: Secondary | ICD-10-CM | POA: Insufficient documentation

## 2020-02-13 DIAGNOSIS — R911 Solitary pulmonary nodule: Secondary | ICD-10-CM | POA: Diagnosis not present

## 2020-02-13 DIAGNOSIS — R5383 Other fatigue: Secondary | ICD-10-CM | POA: Diagnosis not present

## 2020-02-13 DIAGNOSIS — J811 Chronic pulmonary edema: Secondary | ICD-10-CM | POA: Diagnosis not present

## 2020-02-13 LAB — HEMOCCULT GUIAC POC 1CARD (OFFICE): Fecal Occult Blood, POC: NEGATIVE

## 2020-02-13 LAB — CBC WITH DIFFERENTIAL/PLATELET
Abs Immature Granulocytes: 0.04 10*3/uL (ref 0.00–0.07)
Basophils Absolute: 0 10*3/uL (ref 0.0–0.1)
Basophils Relative: 1 %
Eosinophils Absolute: 0 10*3/uL (ref 0.0–0.5)
Eosinophils Relative: 1 %
HCT: 41.3 % (ref 36.0–46.0)
Hemoglobin: 13.3 g/dL (ref 12.0–15.0)
Immature Granulocytes: 1 %
Lymphocytes Relative: 14 %
Lymphs Abs: 0.9 10*3/uL (ref 0.7–4.0)
MCH: 30.9 pg (ref 26.0–34.0)
MCHC: 32.2 g/dL (ref 30.0–36.0)
MCV: 95.8 fL (ref 80.0–100.0)
Monocytes Absolute: 0.4 10*3/uL (ref 0.1–1.0)
Monocytes Relative: 7 %
Neutro Abs: 5.1 10*3/uL (ref 1.7–7.7)
Neutrophils Relative %: 76 %
Platelets: 213 10*3/uL (ref 150–400)
RBC: 4.31 MIL/uL (ref 3.87–5.11)
RDW: 13.5 % (ref 11.5–15.5)
WBC: 6.5 10*3/uL (ref 4.0–10.5)
nRBC: 0 % (ref 0.0–0.2)

## 2020-02-13 LAB — COMPREHENSIVE METABOLIC PANEL
ALT: 16 U/L (ref 0–44)
AST: 33 U/L (ref 15–41)
Albumin: 4.2 g/dL (ref 3.5–5.0)
Alkaline Phosphatase: 53 U/L (ref 38–126)
Anion gap: 11 (ref 5–15)
BUN: 21 mg/dL (ref 8–23)
CO2: 25 mmol/L (ref 22–32)
Calcium: 9.8 mg/dL (ref 8.9–10.3)
Chloride: 98 mmol/L (ref 98–111)
Creatinine, Ser: 1.18 mg/dL — ABNORMAL HIGH (ref 0.44–1.00)
GFR, Estimated: 47 mL/min — ABNORMAL LOW (ref >60)
Glucose, Bld: 92 mg/dL (ref 70–99)
Potassium: 4 mmol/L (ref 3.5–5.1)
Sodium: 134 mmol/L — ABNORMAL LOW (ref 135–145)
Total Bilirubin: 0.8 mg/dL (ref 0.3–1.2)
Total Protein: 8.5 g/dL — ABNORMAL HIGH (ref 6.5–8.1)

## 2020-02-13 LAB — RESPIRATORY PANEL BY RT PCR (FLU A&B, COVID)
Influenza A by PCR: NEGATIVE
Influenza B by PCR: NEGATIVE
SARS Coronavirus 2 by RT PCR: NEGATIVE

## 2020-02-13 LAB — D-DIMER, QUANTITATIVE: D-Dimer, Quant: 0.61 ug{FEU}/mL — ABNORMAL HIGH (ref 0.00–0.50)

## 2020-02-13 LAB — TROPONIN I (HIGH SENSITIVITY)
Troponin I (High Sensitivity): 13 ng/L (ref <18)
Troponin I (High Sensitivity): 15 ng/L

## 2020-02-13 MED ORDER — ACETAMINOPHEN 325 MG PO TABS
650.0000 mg | ORAL_TABLET | Freq: Once | ORAL | Status: AC
  Administered 2020-02-13: 650 mg via ORAL
  Filled 2020-02-13: qty 2

## 2020-02-13 MED ORDER — SODIUM CHLORIDE 0.9 % IV BOLUS
500.0000 mL | Freq: Once | INTRAVENOUS | Status: AC
  Administered 2020-02-13: 500 mL via INTRAVENOUS

## 2020-02-13 MED ORDER — IOHEXOL 350 MG/ML SOLN
100.0000 mL | Freq: Once | INTRAVENOUS | Status: AC | PRN
  Administered 2020-02-13: 75 mL via INTRAVENOUS

## 2020-02-13 NOTE — Progress Notes (Signed)
Pre visit review using our clinic review tool, if applicable. No additional management support is needed unless otherwise documented below in the visit note. 

## 2020-02-13 NOTE — ED Triage Notes (Signed)
Attempted to get EKG, pt family refused stating they just had one upstairs, pt was sent from PCP upstairs for blood work only.

## 2020-02-13 NOTE — Patient Instructions (Signed)
Please proceed to the ER

## 2020-02-13 NOTE — ED Provider Notes (Signed)
[facility] EMERGENCY DEPARTMENT Provider Note   CSN: [id number] Arrival date & time: 02/13/20  1619     History Chief Complaint  Patient presents with  . Chest Pain    Trudi SHANAUTICA FORKER is a 79 y.o. female.  The history is provided by the patient, the spouse and medical records.  Chest Pain  Sissi SYMPHANI ECKSTROM is a 79 y.o. female who presents to the Emergency Department complaining of chest pain. She presents the emergency department complaining of chest pressure intermittently for the last few days with feeling that she cannot take a deep breath. She also has associated fatigue and malaise. She has experienced occasional dizziness at times, none currently. No reports of fever, cough, abdominal pain, nausea, vomiting. She is having diarrhea, that is typical for her Crohn's. She also has a history of MS in a history of lymphoma - currently in remission. She is fully vaccinated for COVID-19. There have been members of her church that have had COVID infections but this was several weeks ago. Otherwise no known sick contacts. She had a rectal exam performed at PCPs office prior to ED arrival that was negative for acute blood loss.    Past Medical History:  Diagnosis Date  . Allergy   . Arthritis, degenerative    both knees  . B12 deficiency 07/06/2014  . Cancer ([place])   . Colitis   . Diverticulosis   . Duodenitis   . Fatty liver   . Gastritis   . GERD (gastroesophageal reflux disease)   . Heart murmur   . Helicobacter pylori gastritis   . Hyperlipidemia   . Hypertension    not treated, pt reports "no"  . IBD (inflammatory bowel disease)   . Lymphoma ([place])   . Memory difficulty 01/05/2013  . Memory loss    mild  . Multiple sclerosis ([place])   . Osteopenia   . Vitamin B12 deficiency     Patient Active Problem List   Diagnosis Date Noted  . New onset headache 09/26/2019  . Dilated cardiomyopathy ([place]) 03/02/2019  . Osteopenia 12/25/2018  . Hypokalemia 02/28/2018  .  Sepsis ([place]) 02/27/2018  . Diverticulitis 02/27/2018  . Diffuse large B-cell lymphoma ([place]) 02/27/2018  . Osteoarthritis of cervical spine 09/15/2016  . Abnormality of gait 11/07/2015  . Dizziness and giddiness 11/07/2015  . Mild cognitive impairment 01/09/2015  . B12 deficiency 07/06/2014  . Leukopenia 05/06/2013  . Memory difficulty 01/05/2013  . Dyslipidemia 11/28/2008  . Multiple sclerosis ([place]) 11/28/2008  . INFLAMMATORY BOWEL DISEASE 11/28/2008  . DIVERTICULOSIS, COLON 11/28/2008  . FATTY LIVER DISEASE 11/28/2008  . PERSONAL HISTORY OF PEPTIC ULCER DISEASE 11/28/2008    Past Surgical History:  Procedure Laterality Date  . ABDOMINAL HYSTERECTOMY  2010  . BLADDER SURGERY  2010   Bladder Tack  . CERVICAL SPINE SURGERY    . CHOLECYSTECTOMY    . HEMORRHOID SURGERY    . ROTATOR CUFF REPAIR    . SPINAL FUSION    . TUBAL LIGATION       OB History   No obstetric history on file.     Family History  Problem Relation Age of Onset  . Diabetes Father   . Heart disease Father   . Colon polyps Mother   . Heart failure Mother   . Breast cancer Cousin   . Colon cancer Maternal Grandmother        ? may be duodenal cancer  . Colon polyps Brother   . Hypertension Brother   .  Pancreatic cancer Maternal Uncle   . Esophageal cancer Neg Hx   . Rectal cancer Neg Hx   . Stomach cancer Neg Hx     Social History   Tobacco Use  . Smoking status: Former Smoker    Types: Cigarettes    Quit date: 06/27/1997    Years since quitting: 22.6  . Smokeless tobacco: Never Used  Vaping Use  . Vaping Use: Never used  Substance Use Topics  . Alcohol use: No  . Drug use: No    Home Medications Prior to Admission medications   Medication Sig Start Date End Date Taking? Authorizing Provider  b complex vitamins tablet Take 1 tablet by mouth daily.    [provider]  Cholecalciferol (VITAMIN D3) 5000 units TABS Take 5,000 Units by mouth daily.     [provider]   cyanocobalamin (,VITAMIN B-12,) 1000 MCG/ML injection Inject 19m IM every 7 days x 4 doses then inject 133mIM every month 02/24/19   Nandigam, KaVenia MinksMD  desonide (DESOWEN) 0.05 % lotion Apply topically 2 (two) times daily. Use as needed for itchy ears 11/03/19   Copland, JeGay FillerMD  gabapentin (NEURONTIN) 600 MG tablet Take 1 tablet (600 mg total) by mouth 3 (three) times daily. 01/31/20   SlSuzzanne CloudNP  Glucosamine 500 MG TABS Take 1 tablet by mouth 2 (two) times daily.     [provider]  loratadine (CLARITIN) 10 MG tablet Take 1 tablet (10 mg total) by mouth daily as needed for allergies. 02/28/18   Rama, ChVenetia MaxonMD  LORazepam (ATIVAN) 0.5 MG tablet Take 1 tablet (0.5 mg total) by mouth every 8 (eight) hours. Use as needed for anxiety Patient not taking: Reported on 02/13/2020 01/12/20   Copland, JeGay FillerMD  memantine (NAMENDA) 10 MG tablet Take 1 tablet (10 mg total) by mouth 2 (two) times daily. 01/31/20   SlSuzzanne CloudNP  Multiple Vitamin (MULTIVITAMIN) tablet Take 1 tablet by mouth daily.     [provider]  Probiotic Product (ALIGN PO) Take 1 tablet by mouth daily.     [provider]  rosuvastatin (CRESTOR) 20 MG tablet Take 1 tablet (20 mg total) by mouth daily. 01/25/20   Copland, JeGay FillerMD  Syringe/Needle, Disp, (SYRINGE 3CC/25GX1") 25G X 1" 3 ML MISC Use with B12 injections 02/24/19   Nandigam, KaVenia MinksMD  Ubrogepant (UBRELVY) 50 MG TABS Take 50 mg by mouth as needed (take 1 at onset of headache; may repeat once 2 hours after intial dosing). Patient not taking: Reported on 02/13/2020 01/31/20   SlSuzzanne CloudNP    Allergies    Sulfa antibiotics, Vytorin [ezetimibe-simvastatin], Aubagio [teriflunomide], Cephalexin, Doxycycline, Erythromycin, Macrobid [nitrofurantoin macrocrystal], Morphine, Rituxan [rituximab], Sulfonamide derivatives, and Amoxicillin  Review of Systems   Review of Systems  Cardiovascular: Positive for chest  pain.  All other systems reviewed and are negative.   Physical Exam Updated Vital Signs BP 126/74   Pulse (!) 112   Temp 100 F (37.8 C) (Oral)   Resp 18   Ht 5' 5.5" (1.664 m)   Wt 60.8 kg   SpO2 97%   BMI 21.96 kg/m   Physical Exam Vitals and nursing note reviewed.  Constitutional:      Appearance: She is well-developed.  HENT:     Head: Normocephalic and atraumatic.  Cardiovascular:     Rate and Rhythm: Regular rhythm. Tachycardia present.     Heart sounds: No  murmur heard.   Pulmonary:     Effort: Pulmonary effort is normal. No respiratory distress.     Breath sounds: Normal breath sounds.  Abdominal:     Palpations: Abdomen is soft.     Tenderness: There is no abdominal tenderness. There is no guarding or rebound.  Musculoskeletal:        General: No swelling or tenderness.  Skin:    General: Skin is warm and dry.  Neurological:     Mental Status: She is alert and oriented to person, place, and time.  Psychiatric:        Behavior: Behavior normal.     ED Results / Procedures / Treatments   Labs (all labs ordered are listed, but only abnormal results are displayed) Labs Reviewed  COMPREHENSIVE METABOLIC PANEL - Abnormal; Notable for the following components:      Result Value   Sodium 134 (*)    Creatinine, Ser 1.18 (*)    Total Protein 8.5 (*)    GFR, Estimated 47 (*)    All other components within normal limits  D-DIMER, QUANTITATIVE (NOT AT [facility]) - Abnormal; Notable for the following components:   D-Dimer, Quant 0.61 (*)    All other components within normal limits  RESPIRATORY PANEL BY RT PCR (FLU A&B, COVID)  CBC WITH DIFFERENTIAL/PLATELET  TROPONIN I (HIGH SENSITIVITY)  TROPONIN I (HIGH SENSITIVITY)    EKG EKG Interpretation  Date/Time:  Monday February 13 2020 17:24:06 EDT Ventricular Rate:  108 PR Interval:    QRS Duration: 88 QT Interval:  315 QTC Calculation: 423 R Axis:   -26 Text Interpretation: Sinus tachycardia LVH by voltage  Confirmed by Quintella Reichert [phone]) on 02/13/2020 5:38:33 PM   Radiology CT Angio Chest PE W/Cm &/Or Wo Cm  Result Date: 02/13/2020 CLINICAL DATA:  PE suspected, low/intermediate prob, positive D-dimer Chest discomfort and lightheadedness.  Shortness of breath. EXAM: CT ANGIOGRAPHY CHEST WITH CONTRAST TECHNIQUE: Multidetector CT imaging of the chest was performed using the standard protocol during bolus administration of intravenous contrast. Multiplanar CT image reconstructions and MIPs were obtained to evaluate the vascular anatomy. CONTRAST:  22m OMNIPAQUE IOHEXOL 350 MG/ML SOLN COMPARISON:  Radiograph earlier today.  Chest CT 06/10/2017 FINDINGS: Cardiovascular: There are no filling defects within the pulmonary arteries to suggest pulmonary embolus. Atherosclerosis of the thoracic aorta without dissection or acute aortic findings. No aortic aneurysm Upper normal heart size. No pericardial effusion. There are coronary artery calcifications. Mediastinum/Nodes: No enlarged mediastinal or hilar lymph nodes. No esophageal wall thickening. Small bilateral thyroid nodules. This has been evaluated on previous imaging. (ref: J Am Coll Radiol. 2015 Feb;12(2): 143-50).Prior thyroid ultrasound 12/17/2018 Lungs/Pleura: No confluent consolidation or evidence of pneumonia. Slight heterogeneous pulmonary parenchyma is most prominently involving the dependent lung zone likely hypoventilatory changes. No septal thickening or findings of pulmonary edema. There is no pleural fluid. Occasional calcified granuloma in the right lower lobe. There is a tiny perifissural nodule in the right middle lobe, series 5, image 50. Tiny pulmonary nodule in the right lung apex measuring 3 mm, series 5, image 18. These nodules may have been present on prior exam but appears slightly larger. The tiny perifissural nodule in the left upper lobe, series 5, image 20, and is unchanged in typical of an intrapulmonary lymph node. The trachea and  central bronchi are patent. Upper Abdomen: Cortical scarring in the upper left kidney at site of previous renal mass. No acute upper abdominal findings. There is a duodenal diverticulum without inflammation.  Cholecystectomy diverticulosis of the left colon, partially included. Musculoskeletal: There are no acute or suspicious osseous abnormalities. Occasional degenerative change in the thoracic spine. Postsurgical change in the lower cervical spine is partially included. Review of the MIP images confirms the above findings. IMPRESSION: 1. No pulmonary embolus. 2. Mild dependent hypoventilatory changes.  No other acute findings. 3. Tiny pulmonary nodules in the right lung, largest in the upper lobe measuring 3 mm. These nodules may have been present on 2019 exam but appears slightly larger. Recommend follow-up chest CT in 12 months given history of kidney cancer. 4. An additional tiny perifissural nodule in the left upper lobe is stable and consistent with intrapulmonary lymph node. Aortic Atherosclerosis (ICD10-I70.0). Electronically Signed   By: Keith Rake M.D.   On: 02/13/2020 20:33   DG Chest Port 1 View  Result Date: 02/13/2020 CLINICAL DATA:  Chest pain and discomfort, lightheadedness, shortness of breath EXAM: PORTABLE CHEST 1 VIEW COMPARISON:  02/27/2018 FINDINGS: The heart size and mediastinal contours are within normal limits. Both lungs are clear. The visualized skeletal structures are unremarkable. IMPRESSION: No active disease. Electronically Signed   By: Randa Ngo M.D.   On: 02/13/2020 17:47    Procedures Procedures (including critical care time)  Medications Ordered in ED Medications  sodium chloride 0.9 % bolus 500 mL (0 mLs Intravenous Stopped 02/13/20 1827)  iohexol (OMNIPAQUE) 350 MG/ML injection 100 mL (75 mLs Intravenous Contrast Given 02/13/20 2005)  acetaminophen (TYLENOL) tablet 650 mg (650 mg Oral Given 02/13/20 1956)    ED Course  I have reviewed the triage vital  signs and the nursing notes.  Pertinent labs & imaging results that were available during my care of the patient were reviewed by me and considered in my medical decision making (see chart for details).    MDM Rules/Calculators/A&P                         patient here for evaluation of fatigue, intermittent chest pain and shortness of breath for the last few days. She is non-toxic appearing on evaluation and in no acute distress. She is mildly tachycardic during her ED stay, EKG with sinus tachycardia. Imaging is negative for pneumonia. Presentation is not consistent with CHF, ACS. D dimer was elevated and a CTA was obtained, which was negative for PE. Patient is asymptomatic during her ED stay. Suspect possible viral process. Plan to discharge home with PCP, cardiology follow-up. Discussed close return precautions if she develops any new or concerning symptoms due to unclear source of her current symptoms. COVID-19 test is negative.  Rima OPHA MCGHEE was evaluated in Emergency Department on 02/13/2020 for the symptoms described in the history of present illness. She was evaluated in the context of the global COVID-19 pandemic, which necessitated consideration that the patient might be at risk for infection with the SARS-CoV-2 virus that causes COVID-19. Institutional protocols and algorithms that pertain to the evaluation of patients at risk for COVID-19 are in a state of rapid change based on information released by regulatory bodies including the CDC and federal and state organizations. These policies and algorithms were followed during the patient's care in the ED.  Final Clinical Impression(s) / ED Diagnoses Final diagnoses:  Atypical chest pain  Sinus tachycardia    Rx / DC Orders ED Discharge Orders    None       Quintella Reichert, MD 02/13/20 [phone]

## 2020-02-13 NOTE — ED Triage Notes (Signed)
Pt sent by PCP for "discomfort in chest and lightheadedness with some shortness of breath"

## 2020-02-13 NOTE — ED Notes (Signed)
X-ray at bedside

## 2020-02-13 NOTE — Progress Notes (Signed)
Subjective:    Patient ID: [name], female    DOB: 04/14/1941, 79 y.o.   MRN: [id number]  DOS:  02/13/2020 Type of visit - description: Acute visit, here with her husband.  BP noted to be low.  Not feeling well for the last 3 days.  Feeling tired so they started to check her blood pressures, BPs yesterday: 126/74. Today it was 77/49 with a heart rate of 111.  Subsequently 87/53 with a heart rate of 106.  When asked, she reports chest pain, she has 3-4 episodes yesterday, last few minutes, in the middle of the chest without radiation.  No sharp but like " putting pressure with finger on the chest". No related to food.  She also report mild DOE but symptom is not completely clear to me, she has MCI.   Review of Systems No fever chills.  No cough, no URI type of symptoms She has a history of Crohn's, denies nausea vomiting, blood in the stools or change in the color of the stools or abdominal pain but did have diarrhea today. No recent NSAIDs Not taking any new medications. Admits to mild lightheadedness when she stands up.   Past Medical History:  Diagnosis Date  . Allergy   . Arthritis, degenerative    both knees  . B12 deficiency 07/06/2014  . Cancer ([place])   . Colitis   . Diverticulosis   . Duodenitis   . Fatty liver   . Gastritis   . GERD (gastroesophageal reflux disease)   . Heart murmur   . Helicobacter pylori gastritis   . Hyperlipidemia   . Hypertension    not treated, pt reports "no"  . IBD (inflammatory bowel disease)   . Lymphoma ([place])   . Memory difficulty 01/05/2013  . Memory loss    mild  . Multiple sclerosis ([place])   . Osteopenia   . Vitamin B12 deficiency     Past Surgical History:  Procedure Laterality Date  . ABDOMINAL HYSTERECTOMY  2010  . BLADDER SURGERY  2010   Bladder Tack  . CERVICAL SPINE SURGERY    . CHOLECYSTECTOMY    . HEMORRHOID SURGERY    . ROTATOR CUFF REPAIR    . SPINAL FUSION    . TUBAL LIGATION      Allergies as of  02/13/2020      Reactions   Sulfa Antibiotics Hives, Nausea And Vomiting   Vytorin [ezetimibe-simvastatin]    White skin patch   Aubagio [teriflunomide]    diarrhea   Cephalexin    Doxycycline    Raw mouth    Erythromycin    REACTION: weakness   Macrobid [nitrofurantoin Macrocrystal] Hives   Morphine Nausea And Vomiting   Rituxan [rituximab]    Stated had issue with lungs after her last treatment   Sulfonamide Derivatives Hives, Nausea And Vomiting   Amoxicillin Hives      Medication List       Accurate as of February 13, 2020  3:41 PM. If you have any questions, ask your nurse or doctor.        ALIGN PO Take 1 tablet by mouth daily.   b complex vitamins tablet Take 1 tablet by mouth daily.   cyanocobalamin 1000 MCG/ML injection Commonly known as: (VITAMIN B-12) Inject 85m IM every 7 days x 4 doses then inject 189mIM every month   desonide 0.05 % lotion Commonly known as: DESOWEN Apply topically 2 (two) times daily. Use as needed for itchy ears  gabapentin 600 MG tablet Commonly known as: NEURONTIN Take 1 tablet (600 mg total) by mouth 3 (three) times daily.   Glucosamine 500 MG Tabs Take 1 tablet by mouth 2 (two) times daily.   loratadine 10 MG tablet Commonly known as: CLARITIN Take 1 tablet (10 mg total) by mouth daily as needed for allergies.   LORazepam 0.5 MG tablet Commonly known as: Ativan Take 1 tablet (0.5 mg total) by mouth every 8 (eight) hours. Use as needed for anxiety   memantine 10 MG tablet Commonly known as: NAMENDA Take 1 tablet (10 mg total) by mouth 2 (two) times daily.   multivitamin tablet Take 1 tablet by mouth daily.   rosuvastatin 20 MG tablet Commonly known as: Crestor Take 1 tablet (20 mg total) by mouth daily.   SYRINGE 3CC/25GX1" 25G X 1" 3 ML Misc Use with B12 injections   Ubrelvy 50 MG Tabs Generic drug: Ubrogepant Take 50 mg by mouth as needed (take 1 at onset of headache; may repeat once 2 hours after intial  dosing).   Vitamin D3 125 MCG (5000 UT) Tabs Take 5,000 Units by mouth daily.          Objective:   Physical Exam BP 104/70 (BP Location: Left Arm, Patient Position: Sitting, Cuff Size: Small)   Pulse (!) 121   Temp 97.9 F (36.6 C) (Oral)   Resp 18   Ht 5' 5.5" (1.664 m)   Wt 134 lb (60.8 kg)   SpO2 99%   BMI 21.96 kg/m  General:   Well developed, NAD, BMI noted.  HEENT:  Normocephalic . Face symmetric, atraumatic; slt pale? Lungs:  CTA B Normal respiratory effort, no intercostal retractions, no accessory muscle use. Heart: RRR,  no murmur.  Abdomen:  Not distended, soft, minimal tenderness at the distal right lower quadrant of the abdomen without mass or rebound. DRE: Very small amount of the stools found, no blood, no mucus.  Hemoccult negative Skin: Not pale. Not jaundice Lower extremities: no pretibial edema bilaterally  Neurologic:  alert & oriented X3.  Speech normal, gait appropriate for age and unassisted Psych--  Cognition and judgment appear intact.  Cooperative with normal attention span and concentration.  Behavior appropriate. No anxious or depressed appearing.     Assessment      79 year old female with multiple medical problems including diverticulitis, cardiomyopathy, Crohn's disease, MS, history of lymphoma, DJD, B12 deficiency, mild cognitive impairment, multiple drug allergies, presents with:  Low BP, tachycardia, chest pain, fatigue:  The patient started to feel fatigue about 3 days ago, today she was noted to be hypotensive and  Tachycardic @ home which is not her normal per chart review. At the office BP is indeed slightly low and heart rate in the 120s. When asked, admits to chest pain as described above On exam there is minimal tenderness at the right lower quadrant, DRE showed minimal amount of brownish stools Hemoccult negative. EKG today: Sinus tachycardia, no acute changes. He needs further evaluation given low BP and tachycardia  keeping in mind that she had chest pain yesterday and she looks slightly pale. Case discussed with the ER MD who is in agreement to see the patient, help appreciated. Patient and husband agreed as well.    This visit occurred during the SARS-CoV-2 public health emergency.  Safety protocols were in place, including screening questions prior to the visit, additional usage of staff PPE, and extensive cleaning of exam room while observing appropriate contact time as indicated for  disinfecting solutions.

## 2020-02-13 NOTE — Discharge Instructions (Addendum)
The cause of your symptoms were not found today. Your heart rate was mildly elevated. Please drink plenty of fluids. If you develop fever you may take Tylenol. Get rechecked immediately if you have new or concerning symptoms.  You had a CT scan performed in the emergency department today that showed some nodules in your lungs. Please follow-up with your family doctor, you will need another scan of your lungs in the next year for recheck.

## 2020-02-13 NOTE — ED Notes (Signed)
ED Provider at bedside. 

## 2020-02-14 ENCOUNTER — Telehealth: Payer: Self-pay | Admitting: Family Medicine

## 2020-02-14 ENCOUNTER — Encounter: Payer: Self-pay | Admitting: Family Medicine

## 2020-02-14 MED ORDER — LEVOFLOXACIN 250 MG PO TABS: 250.0000 mg | ORAL_TABLET | Freq: Every day | ORAL | 0 refills | Status: DC

## 2020-02-14 NOTE — Telephone Encounter (Signed)
Pt was seen in the ER yesterday- thought to have likely viral illness.  Pt and her husband are now contacting me with request of abx  She has several medication allergies which limit abx choices.   Message to pt- quinolone?

## 2020-02-15 ENCOUNTER — Telehealth: Payer: Self-pay | Admitting: Gastroenterology

## 2020-02-15 NOTE — Telephone Encounter (Signed)
Spoke with the patient. She is on ATB's through her PCP.  Appointment rescheduled to 03/07/20 at 8:10 am. She will call us if she needs anything before this appointment.

## 2020-02-15 NOTE — Telephone Encounter (Signed)
Pt just called to r/s her appt with Dr. Silverio Decamp for tomorrow 02/16/20 because she woke up with temperature and is not feeling well. Dr. Silverio Decamp is fully booked for the remainder of the year. Pt stated that Dr. Silverio Decamp wanted to see pt so pt wants to know if she can wait until January for an appt with her or if she can be squeezed into her schedule before end of the year, or if she can see an APP. Pls call her.

## 2020-02-16 ENCOUNTER — Ambulatory Visit: Payer: Medicare HMO | Admitting: Gastroenterology

## 2020-03-07 ENCOUNTER — Encounter: Payer: Self-pay | Admitting: Gastroenterology

## 2020-03-07 ENCOUNTER — Other Ambulatory Visit: Payer: Self-pay

## 2020-03-07 ENCOUNTER — Other Ambulatory Visit (INDEPENDENT_AMBULATORY_CARE_PROVIDER_SITE_OTHER): Payer: Medicare HMO

## 2020-03-07 ENCOUNTER — Ambulatory Visit (INDEPENDENT_AMBULATORY_CARE_PROVIDER_SITE_OTHER): Payer: Medicare HMO | Admitting: Gastroenterology

## 2020-03-07 VITALS — BP 140/78 | HR 86 | Ht 65.5 in | Wt 133.0 lb

## 2020-03-07 DIAGNOSIS — K5080 Crohn's disease of both small and large intestine without complications: Secondary | ICD-10-CM

## 2020-03-07 LAB — HIGH SENSITIVITY CRP: CRP, High Sensitivity: 4.12 mg/L (ref 0.000–5.000)

## 2020-03-07 LAB — SEDIMENTATION RATE: Sed Rate: 32 mm/hr — ABNORMAL HIGH (ref 0–30)

## 2020-03-07 NOTE — Progress Notes (Signed)
[name]    [id number]    November 11, 1940  Primary Care Physician:Holder, Vicki Filler, MD  Referring Physician: Darreld Holder, [name] [address] [place],  [place] [address]   Chief complaint: Crohn's disease  HPI:  79 year old female with history of multiple sclerosis and diffuse large B-cell lymphoma s/p chemotherapy in remission here for follow-up visit for Crohn's disease She is overall doing well from GI standpoint.  She completed course of budesonide. Denies any nausea, vomiting, abdominal pain, melena or bright red blood per rectum.  On average she has 2-3 soft bowel movements per day, she feels its getting more formed.  She was not feeling well earlier this month, had chest discomfort.  She was evaluated in the ER. CT chest PE protocol 02/13/20: Negative for acute PE  Colonoscopy 03/23/2019: Diffuse inflammation with ulceration and stenosis in the terminal ileum. Mild inflammation in cecum and ascending colon otherwise rest of colon appeared normal. Biopsies consistent with chronic inflammation and Crohn's disease. Mildly active chronic ileitis and colitis  CT abdomen and pelvis 03/04/2018: Subacute diverticulitis involving proximal sigmoid colon  Colonoscopy March 2016: cecal ulcer, biopsies chronic minimal active colitis.    Outpatient Encounter Medications as of 03/07/2020  Medication Sig  . b complex vitamins tablet Take 1 tablet by mouth daily.  . Cholecalciferol (VITAMIN D3) 5000 units TABS Take 5,000 Units by mouth daily.   . cyanocobalamin (,VITAMIN B-12,) 1000 MCG/ML injection Inject 8m IM every 7 days x 4 doses then inject 160mIM every month  . desonide (DESOWEN) 0.05 % lotion Apply topically 2 (two) times daily. Use as needed for itchy ears  . gabapentin (NEURONTIN) 600 MG tablet Take 1 tablet (600 mg total) by mouth 3 (three) times daily.  . Glucosamine 500 MG TABS Take 1 tablet by mouth 2 (two) times daily.   . [name] Kitchenoratadine  (CLARITIN) 10 MG tablet Take 1 tablet (10 mg total) by mouth daily as needed for allergies.  . [name] KitchenORazepam (ATIVAN) 0.5 MG tablet Take 1 tablet (0.5 mg total) by mouth every 8 (eight) hours. Use as needed for anxiety  . memantine (NAMENDA) 10 MG tablet Take 1 tablet (10 mg total) by mouth 2 (two) times daily.  . Multiple Vitamin (MULTIVITAMIN) tablet Take 1 tablet by mouth daily.   . Probiotic Product (ALIGN PO) Take 1 tablet by mouth daily.   . rosuvastatin (CRESTOR) 20 MG tablet Take 1 tablet (20 mg total) by mouth daily.  . Syringe/Needle, Disp, (SYRINGE 3CC/25GX1") 25G X 1" 3 ML MISC Use with B12 injections  . Ubrogepant (UBRELVY) 50 MG TABS Take 50 mg by mouth as needed (take 1 at onset of headache; may repeat once 2 hours after intial dosing).  . [DISCONTINUED] levofloxacin (LEVAQUIN) 250 MG tablet Take 1 tablet (250 mg total) by mouth daily.   No facility-administered encounter medications on file as of 03/07/2020.    Allergies as of 03/07/2020 - Review Complete 03/07/2020  Allergen Reaction Noted  . Sulfa antibiotics Hives and Nausea And Vomiting 11/28/2008  . Vytorin [ezetimibe-simvastatin]  07/29/2013  . Aubagio [teriflunomide]  04/21/2019  . Cephalexin  11/23/2017  . Doxycycline  03/13/2011  . Erythromycin  11/28/2008  . Macrobid [nitrofurantoin macrocrystal] Hives 12/22/2012  . Morphine Nausea And Vomiting 11/28/2008  . Rituxan [rituximab]  04/21/2019  . Sulfonamide derivatives Hives and Nausea And Vomiting 11/28/2008  . Amoxicillin Hives 11/28/2008    Past Medical History:  Diagnosis Date  . Allergy   . Arthritis, degenerative    both knees  . B12 deficiency 07/06/2014  . Cancer ([place])   . Colitis   . Diverticulosis   . Duodenitis   . Fatty liver   . Gastritis   . GERD (gastroesophageal reflux disease)   . Heart murmur   . Helicobacter pylori gastritis   . Hyperlipidemia   . Hypertension    not treated, pt reports "no"  . IBD (inflammatory bowel disease)   .  Lymphoma ([place])   . Memory difficulty 01/05/2013  . Memory loss    mild  . Multiple sclerosis ([place])   . Osteopenia   . Vitamin B12 deficiency     Past Surgical History:  Procedure Laterality Date  . ABDOMINAL HYSTERECTOMY  2010  . BLADDER SURGERY  2010   Bladder Tack  . CERVICAL SPINE SURGERY    . CHOLECYSTECTOMY    . HEMORRHOID SURGERY    . ROTATOR CUFF REPAIR    . SPINAL FUSION    . TUBAL LIGATION      Family History  Problem Relation Age of Onset  . Diabetes Father   . Heart disease Father   . Colon polyps Mother   . Heart failure Mother   . Breast cancer Cousin   . Colon cancer Maternal Grandmother        ? may be duodenal cancer  . Colon polyps Brother   . Hypertension Brother   . Pancreatic cancer Maternal Uncle   . Esophageal cancer Neg Hx   . Rectal cancer Neg Hx   . Stomach cancer Neg Hx     Social History   Socioeconomic History  . Marital status: Married    Spouse name: [name]   . Number of children: 4  . Years of education: 12+  . Highest education level: Not on file  Occupational History  . 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 Smoking status: Former Smoker    Types: Cigarettes    Quit date: 06/27/1997    Years since quitting: 22.7  . Smokeless tobacco: Never Used  Vaping Use  . Vaping Use: Never used  Substance and Sexual Activity  . Alcohol use: No  . Drug use: No  . Sexual activity: Not Currently  Other Topics Concern  . Not on file  Social History Narrative   Caffeine daily.    Patient lives at home with husband [name] but goes by [employer].    Patient has 4 children.    Patient has 1 year of college.    Patient is retired.    Patient is left handed.    Patient drinks 2 cups of coffee per day.   Social Determinants of Health   Financial Resource Strain: Low Risk   . Difficulty of Paying Living Expenses: Not hard at all    Food Insecurity: No Food Insecurity  . Worried About Charity fundraiser in the Last Year: Never true  . Ran Out of Food in the Last Year: Never true  Transportation Needs: No Transportation Needs  . Lack of Transportation (Medical): No  . Lack of Transportation (Non-Medical): No  Physical Activity:   . Days of Exercise per Week: Not on file  . Minutes of Exercise per Session: Not on file  Stress:   . Feeling of Stress : Not on file  Social Connections:   . Frequency of Communication with Friends and Family: Not on file  . Frequency of Social Gatherings with Friends and Family: Not on file  . Attends Religious Services: Not on file  . Active Member of Clubs or Organizations: Not on file  . Attends Archivist Meetings: Not on file  . Marital Status: Not on file  Intimate Partner Violence:   . Fear of Current or Ex-Partner: Not on file  . Emotionally Abused: Not on file  . Physically Abused: Not on file  . Sexually Abused: Not on file      Review of systems: All other review of systems negative except as mentioned in the HPI.   Physical Exam: Vitals:   03/07/20 0809  BP: 140/78  Pulse: 86   Body mass index is 21.8 kg/m. Gen:      No acute distress HEENT:  sclera anicteric Psych: normal mood and affect  Data Reviewed:  Reviewed labs, radiology imaging, old records and pertinent past GI work up   Assessment and Plan/Recommendations:  79 year old very pleasant female with history of multiple sclerosis, diffuse large B-cell lymphoma s/p chemotherapy in remission, history of sigmoid diverticulitis and Crohn's disease with involvement of small and large intestine  Crohn's disease: Symptoms are stable, appears to be in clinical remission.  We will continue to monitor. Check CRP and ESR level. Reviewed recent labs   Return in 6 months or sooner if needed  This visit required 40 minutes of patient care (this includes precharting, chart review, review of  results, face-to-face time used for counseling as well as treatment plan and follow-up. The patient was provided an opportunity to ask questions and all were answered. The patient agreed with the plan and demonstrated an understanding of the instructions.  Damaris Hippo , MD    CC: Holder, Vicki Filler, MD

## 2020-03-07 NOTE — Patient Instructions (Signed)
Your provider has requested that you go to the basement level for lab work before leaving today. Press "B" on the elevator. The lab is located at the first door on the left as you exit the elevator.  Follow up in 6 months  Due to recent changes in healthcare laws, you may see the results of your imaging and laboratory studies on MyChart before your provider has had a chance to review them.  We understand that in some cases there may be results that are confusing or concerning to you. Not all laboratory results come back in the same time frame and the provider may be waiting for multiple results in order to interpret others.  Please give [place] 48 hours in order for your provider to thoroughly review all the results before contacting the office for clarification of your results.   If you are age 78 or older, your body mass index should be between 23-30. Your Body mass index is 21.8 kg/m. If this is out of the aforementioned range listed, please consider follow up with your Primary Care Provider.  If you are age 19 or younger, your body mass index should be between 19-25. Your Body mass index is 21.8 kg/m. If this is out of the aformentioned range listed, please consider follow up with your Primary Care Provider.    I appreciate the  opportunity to care for you  Thank You   [name] , MD

## 2020-03-08 ENCOUNTER — Encounter: Payer: Self-pay | Admitting: Gastroenterology

## 2020-03-22 ENCOUNTER — Other Ambulatory Visit: Payer: Self-pay | Admitting: Gastroenterology

## 2020-03-23 ENCOUNTER — Other Ambulatory Visit: Payer: Self-pay | Admitting: Gastroenterology

## 2020-05-08 DIAGNOSIS — K5790 Diverticulosis of intestine, part unspecified, without perforation or abscess without bleeding: Secondary | ICD-10-CM | POA: Insufficient documentation

## 2020-05-08 DIAGNOSIS — C8590 Non-Hodgkin lymphoma, unspecified, unspecified site: Secondary | ICD-10-CM | POA: Insufficient documentation

## 2020-05-08 DIAGNOSIS — B9681 Helicobacter pylori [H. pylori] as the cause of diseases classified elsewhere: Secondary | ICD-10-CM | POA: Insufficient documentation

## 2020-05-08 DIAGNOSIS — M1990 Unspecified osteoarthritis, unspecified site: Secondary | ICD-10-CM | POA: Insufficient documentation

## 2020-05-08 DIAGNOSIS — C801 Malignant (primary) neoplasm, unspecified: Secondary | ICD-10-CM | POA: Insufficient documentation

## 2020-05-08 DIAGNOSIS — R011 Cardiac murmur, unspecified: Secondary | ICD-10-CM | POA: Insufficient documentation

## 2020-05-08 DIAGNOSIS — E538 Deficiency of other specified B group vitamins: Secondary | ICD-10-CM | POA: Insufficient documentation

## 2020-05-08 DIAGNOSIS — K219 Gastro-esophageal reflux disease without esophagitis: Secondary | ICD-10-CM | POA: Insufficient documentation

## 2020-05-08 DIAGNOSIS — R413 Other amnesia: Secondary | ICD-10-CM | POA: Insufficient documentation

## 2020-05-08 DIAGNOSIS — K529 Noninfective gastroenteritis and colitis, unspecified: Secondary | ICD-10-CM | POA: Insufficient documentation

## 2020-05-08 DIAGNOSIS — K2970 Gastritis, unspecified, without bleeding: Secondary | ICD-10-CM | POA: Insufficient documentation

## 2020-05-08 DIAGNOSIS — E785 Hyperlipidemia, unspecified: Secondary | ICD-10-CM | POA: Insufficient documentation

## 2020-05-08 DIAGNOSIS — I10 Essential (primary) hypertension: Secondary | ICD-10-CM | POA: Insufficient documentation

## 2020-05-08 DIAGNOSIS — T7840XA Allergy, unspecified, initial encounter: Secondary | ICD-10-CM | POA: Insufficient documentation

## 2020-05-08 DIAGNOSIS — K760 Fatty (change of) liver, not elsewhere classified: Secondary | ICD-10-CM | POA: Insufficient documentation

## 2020-05-15 NOTE — Progress Notes (Unsigned)
Cardiology Office Note:    Date:  05/16/2020   ID:  [name], DOB Jan 08, 1941, MRN [id number]  PCP:  Darreld Mclean, MD  Cardiologist:  Shirlee More, MD   Referring MD: Darreld Mclean, MD  ASSESSMENT:    1. Dilated cardiomyopathy ([place])   2. Mixed hyperlipidemia   3. Coronary artery calcification    PLAN:    In order of problems listed above:  1. She has mild cardiomyopathy likely a consequence of chemotherapy without evidence of heart failure and stable mildly reduced ejection fraction on recent echocardiogram.  At this time I continue our process of watchful waiting plan to repeat her echo September and see me afterwards and we will check a proBNP level today if severely elevated would change decision making.  She is comfortable with this careful cautious approach. 2. Stable continue high intensity statin with coronary artery calcification and asked her to resume her over-the-counter fish oil 2 g/day 3. Stable no evidence of angina and no ischemia on perfusion study  Next appointment October following echocardiogram   Medication Adjustments/Labs and Tests Ordered: Current medicines are reviewed at length with the patient today.  Concerns regarding medicines are outlined above.  No orders of the defined types were placed in this encounter.  No orders of the defined types were placed in this encounter.   Follow-up cardiomyopathy  History of Present Illness:    [name] is a 80 y.o. female with a history of cardiomyopathy heart failure non-Hodgkin's lymphoma Crohn's disease MS hyperlipidemia memory loss and coronary artery calcification.  Unfortunately beta-blocker was not tolerated and was discontinued and last echocardiogram September 2020 showed EF of 45 to 50% 46% by myocardial perfusion's test with no evidence of ischemia.  As she was asymptomatic a decision was made to follow conservatively at this point in time.  She was last seen 79/02/2020.  Follow-up  echocardiogram 12/23/2019 showed stable ejection fraction 45 to 50% with normal left ventricular filling pressure and no significant valvular abnormality. She was seen at [facility] 02/13/2020 with shortness of breath and chest pain.  Her EKG showed sinus tachycardia LVH voltage no acute ischemic changes CT angio of the chest showed no evidence of pulmonary embolism there is no acute pathology noted her presentation was not felt to be due to heart failure or ACS and no findings of pulmonary embolism and she was discharged from the hospital.  Her COVID-19 PCR negative high-sensitivity troponin was not elevated.  Since the ED visit she has done well overall her health is improved she is having no shortness of breath edema chest pain palpitation or syncope.  Most recent labs 01/24/2020 shows cholesterol 112 HDL 37 LDL 42 but triglycerides are elevated 315.  A1c normal 6.0%. Past Medical History:  Diagnosis Date  . Allergy   . Arthritis, degenerative    both knees  . B12 deficiency 07/06/2014  . Cancer ([place])   . Colitis   . Diverticulosis   . Duodenitis   . Fatty liver   . Gastritis   . GERD (gastroesophageal reflux disease)   . Heart murmur   . Helicobacter pylori gastritis   . Hyperlipidemia   . Hypertension    not treated, pt reports "no"  . IBD (inflammatory bowel disease)   . Lymphoma ([place])   . Memory difficulty 01/05/2013  . Memory loss    mild  . Multiple sclerosis ([place])   . Osteopenia   . Vitamin B12 deficiency  Past Surgical History:  Procedure Laterality Date  . ABDOMINAL HYSTERECTOMY  2010  . BLADDER SURGERY  2010   Bladder Tack  . CERVICAL SPINE SURGERY    . CHOLECYSTECTOMY    . HEMORRHOID SURGERY    . ROTATOR CUFF REPAIR    . SPINAL FUSION    . TUBAL LIGATION      Current Medications: Current Meds  Medication Sig  . b complex vitamins tablet Take 1 tablet by mouth daily.  . Cholecalciferol (VITAMIN D3) 5000 units TABS Take 5,000 Units by mouth  daily.   . cyanocobalamin (,VITAMIN B-12,) 1000 MCG/ML injection inject 64m IM every month  . desonide (DESOWEN) 0.05 % lotion Apply topically 2 (two) times daily. Use as needed for itchy ears  . gabapentin (NEURONTIN) 600 MG tablet Take 1 tablet (600 mg total) by mouth 3 (three) times daily.  . Glucosamine 500 MG TABS Take 1 tablet by mouth 2 (two) times daily.  .[name] Kitchenloratadine (CLARITIN) 10 MG tablet Take 1 tablet (10 mg total) by mouth daily as needed for allergies.  .[name] KitchenLORazepam (ATIVAN) 0.5 MG tablet Take 1 tablet (0.5 mg total) by mouth every 8 (eight) hours. Use as needed for anxiety  . memantine (NAMENDA) 10 MG tablet Take 1 tablet (10 mg total) by mouth 2 (two) times daily.  . Multiple Vitamin (MULTIVITAMIN) tablet Take 1 tablet by mouth daily.   . rosuvastatin (CRESTOR) 20 MG tablet Take 1 tablet (20 mg total) by mouth daily.  . Syringe/Needle, Disp, (SYRINGE 3CC/25GX1") 25G X 1" 3 ML MISC Use with B12 injections  . Ubrogepant (UBRELVY) 50 MG TABS Take 50 mg by mouth as needed (take 1 at onset of headache; may repeat once 2 hours after intial dosing).     Allergies:   Sulfa antibiotics, Vytorin [ezetimibe-simvastatin], Aubagio [teriflunomide], Cephalexin, Doxycycline, Erythromycin, Macrobid [nitrofurantoin macrocrystal], Morphine, Rituxan [rituximab], Sulfonamide derivatives, and Amoxicillin   Social History   Socioeconomic History  . Marital status: Married    Spouse name: CEduard Clos  . Number of children: 4  . Years of education: 12+  . Highest education level: Not on file  Occupational History  . 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 Smoking status: Former Smoker    Types: Cigarettes    Quit date: 06/27/1997    Years since quitting: 22.9  . Smokeless tobacco: Never Used  Vaping Use  . Vaping Use: Never used  Substance and Sexual Activity  . Alcohol use: No  . Drug  use: No  . Sexual activity: Not Currently  Other Topics Concern  . Not on file  Social History Narrative   Caffeine daily.    Patient lives at home with husband CEduard Closbut goes by [employer]    Patient has 4 children.    Patient has 1 year of college.    Patient is retired.    Patient is left handed.    Patient drinks 2 cups of coffee per day.   Social Determinants of Health   Financial Resource Strain: Low Risk   . Difficulty of Paying Living Expenses: Not hard at all  Food Insecurity: No Food Insecurity  . Worried About RCharity fundraiserin the Last Year: Never true  . Ran Out of Food in the Last Year: Never true  Transportation Needs: No Transportation Needs  . Lack of Transportation (Medical): No  .  Lack of Transportation (Non-Medical): No  Physical Activity: Not on file  Stress: Not on file  Social Connections: Not on file     Family History: The patient's family history includes Breast cancer in her cousin; Colon cancer in her maternal grandmother; Colon polyps in her brother and mother; Diabetes in her father; Heart disease in her father; Heart failure in her mother; Hypertension in her brother; Pancreatic cancer in her maternal uncle. There is no history of Esophageal cancer, Rectal cancer, or Stomach cancer.  ROS:   ROS Please see the history of present illness.     All other systems reviewed and are negative.  EKGs/Labs/Other Studies Reviewed:    The following studies were reviewed today:  Recent Labs: 01/24/2020: TSH 1.19 02/13/2020: ALT 16; BUN 21; Creatinine, Ser 1.18; Hemoglobin 13.3; Platelets 213; Potassium 4.0; Sodium 134  Recent Lipid Panel    Component Value Date/Time   CHOL 112 01/24/2020 1032   TRIG 315 (H) 01/24/2020 1032   HDL 37 (L) 01/24/2020 1032   CHOLHDL 3.0 01/24/2020 1032   VLDL 73.6 (H) 12/13/2018 0857   LDLCALC 42 01/24/2020 1032   LDLDIRECT 37.0 12/13/2018 0857    Physical Exam:    VS:  BP 134/82   Pulse 82   Ht 5' 5.5" (1.664 m)    Wt 136 lb 1.3 oz (61.7 kg)   SpO2 98%   BMI 22.30 kg/m     Wt Readings from Last 3 Encounters:  05/16/20 136 lb 1.3 oz (61.7 kg)  03/07/20 133 lb (60.3 kg)  02/13/20 134 lb (60.8 kg)     GEN:  Well nourished, well developed in no acute distress HEENT: Normal NECK: No JVD; No carotid bruits LYMPHATICS: No lymphadenopathy CARDIAC: RRR, no murmurs, rubs, gallops RESPIRATORY:  Clear to auscultation without rales, wheezing or rhonchi  ABDOMEN: Soft, non-tender, non-distended MUSCULOSKELETAL:  No edema; No deformity  SKIN: Warm and dry NEUROLOGIC:  Alert and oriented x 3 PSYCHIATRIC:  Normal affect     Signed, Shirlee More, MD  05/16/2020 9:43 AM    [place]

## 2020-05-16 ENCOUNTER — Ambulatory Visit: Payer: Medicare HMO | Admitting: Cardiology

## 2020-05-16 ENCOUNTER — Encounter: Payer: Self-pay | Admitting: Cardiology

## 2020-05-16 ENCOUNTER — Other Ambulatory Visit: Payer: Self-pay

## 2020-05-16 VITALS — BP 134/82 | HR 82 | Ht 65.5 in | Wt 136.1 lb

## 2020-05-16 DIAGNOSIS — I2584 Coronary atherosclerosis due to calcified coronary lesion: Secondary | ICD-10-CM

## 2020-05-16 DIAGNOSIS — I420 Dilated cardiomyopathy: Secondary | ICD-10-CM | POA: Diagnosis not present

## 2020-05-16 DIAGNOSIS — R0609 Other forms of dyspnea: Secondary | ICD-10-CM

## 2020-05-16 DIAGNOSIS — R0600 Dyspnea, unspecified: Secondary | ICD-10-CM

## 2020-05-16 DIAGNOSIS — I2510 Atherosclerotic heart disease of native coronary artery without angina pectoris: Secondary | ICD-10-CM

## 2020-05-16 DIAGNOSIS — E782 Mixed hyperlipidemia: Secondary | ICD-10-CM | POA: Diagnosis not present

## 2020-05-16 NOTE — Patient Instructions (Signed)
Medication Instructions:  Your physician recommends that you continue on your current medications as directed. Please refer to the Current Medication list given to you today.  Please use over the counter fish oil and take this twice daily.  *If you need a refill on your cardiac medications before your next appointment, please call your pharmacy*   Lab Work: Your physician recommends that you return for lab work in: [place] If you have labs (blood work) drawn today and your tests are completely normal, you will receive your results only by: [name] Kitchen MyChart Message (if you have MyChart) OR . A paper copy in the mail If you have any lab test that is abnormal or we need to change your treatment, we will call you to review the results.   Testing/Procedures: Your physician has requested that you have an echocardiogram in September. Echocardiography is a painless test that uses sound waves to create images of your heart. It provides your doctor with information about the size and shape of your heart and how well your heart's chambers and valves are working. This procedure takes approximately one hour. There are no restrictions for this procedure.     Follow-Up: At [facility], you and your health needs are our priority.  As part of our continuing mission to provide you with exceptional heart care, we have created designated Provider Care Teams.  These Care Teams include your primary Cardiologist (physician) and Advanced Practice Providers (APPs -  Physician Assistants and Nurse Practitioners) who all work together to provide you with the care you need, when you need it.  We recommend signing up for the patient portal called "MyChart".  Sign up information is provided on this After Visit Summary.  MyChart is used to connect with patients for Virtual Visits (Telemedicine).  Patients are able to view lab/test results, encounter notes, upcoming appointments, etc.  Non-urgent messages can be sent to  your provider as well.   To learn more about what you can do with MyChart, go to [email or web address].    Your next appointment:   8 month(s)  The format for your next appointment:   In Person  Provider:   Shirlee More, MD   Other Instructions

## 2020-05-17 LAB — PRO B NATRIURETIC PEPTIDE: NT-Pro BNP: 297 pg/mL (ref 0–738)

## 2020-06-12 NOTE — Patient Instructions (Incomplete)
It was great to see you today, as always

## 2020-06-12 NOTE — Progress Notes (Deleted)
[place] [facility] [address], [place], [place] [address] [phone]  Date:  06/13/2020   Name:  [name]   DOB:  [birth year]   MRN:  [id number]  PCP:  Darreld Mclean, MD    Chief Complaint: No chief complaint on file.   History of Present Illness:  [name] is a 80 y.o. very pleasant female patient who presents with the following:  Patient seen today for follow-up, concern of recent unusual blood pressure and pulse readings Last seen by myself in September History of MS, large B-cell lymphoma status post chemotherapy in remission, Crohn's disease, osteopenia She was seen by her cardiologist on February 2, Dr. Bettina Gavia 1. She has mild cardiomyopathy likely a consequence of chemotherapy without evidence of heart failure and stable mildly reduced ejection fraction on recent echocardiogram.  At this time I continue our process of watchful waiting plan to repeat her echo September and see me afterwards and we will check a proBNP level today if severely elevated would change decision making.  She is comfortable with this careful cautious approach. 2. Stable continue high intensity statin with coronary artery calcification and asked her to resume her over-the-counter fish oil 2 g/day 3. Stable no evidence of angina and no ischemia on perfusion study  She visited with gastroenterology in November, most recent colonoscopy about 1 year ago Visit with neurology in October-currently she is off of MS medications, she is taking gabapentin Visit with oncology in [place] at [facility] IMPRESSION(S): [name] is a 80 y.o. 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 PET/CT after 3 cycles R-CHOP showed treatment response ([place] 3). On August 8, she completed 6 cycles of R-CHOP chemotherapy. End of treatment PET/CT scan performed on September 12 shows  continued complete metabolic response. There were bilateral airspace opacities consistent with rituximab induced pneumonitis. Shortness of breath has improved with prednisone. In November 2019, she had diverticulitis which resolved. She also developed neutropenia but bone marrow examination on March 25, 2018 was unrevealing. By the end of December, her neutrophil count recovered. She has been in remission since.  PLAN(S): DLBCL dx 06/2017 : s/p 6 mini RCHOP and IT MTX, and IFRT radiation. Last PETCT 11/2018 showed CR. Labs look good today. Pt is clinically doing well in reference to lymphoma. ECOG 0    COVID-19 booster Mammogram Recent lipid profile, CMP and CBC on chart Patient Active Problem List   Diagnosis Date Noted  . Vitamin B12 deficiency   . Memory loss   . Lymphoma ([place])   . IBD (inflammatory bowel disease)   . Hypertension   . Hyperlipidemia   . Helicobacter pylori gastritis   . Heart murmur   . GERD (gastroesophageal reflux disease)   . Gastritis   . Fatty liver   . Diverticulosis   . Colitis   . Cancer ([place])   . Arthritis, degenerative   . Allergy   . New onset headache 09/26/2019  . Dilated cardiomyopathy ([place]) 03/02/2019  . Osteopenia 12/25/2018  . Hypokalemia 02/28/2018  . Sepsis ([place]) 02/27/2018  . Diverticulitis 02/27/2018  . Diffuse large B-cell lymphoma ([place]) 02/27/2018  . Osteoarthritis of cervical spine 09/15/2016  . Abnormality of gait 11/07/2015  . Dizziness and giddiness 11/07/2015  . Mild cognitive impairment 01/09/2015  . B12 deficiency 07/06/2014  . Leukopenia 05/06/2013  . Memory difficulty 01/05/2013  . Dyslipidemia 11/28/2008  .  Multiple sclerosis ([place]) 11/28/2008  . INFLAMMATORY BOWEL DISEASE 11/28/2008  . DIVERTICULOSIS, COLON 11/28/2008  . FATTY LIVER DISEASE 11/28/2008  . PERSONAL HISTORY OF PEPTIC ULCER DISEASE 11/28/2008    Past Medical History:  Diagnosis Date  . Allergy   . Arthritis, degenerative    both knees  . B12  deficiency 07/06/2014  . Cancer ([place])   . Colitis   . Diverticulosis   . Duodenitis   . Fatty liver   . Gastritis   . GERD (gastroesophageal reflux disease)   . Heart murmur   . Helicobacter pylori gastritis   . Hyperlipidemia   . Hypertension    not treated, pt reports "no"  . IBD (inflammatory bowel disease)   . Lymphoma ([place])   . Memory difficulty 01/05/2013  . Memory loss    mild  . Multiple sclerosis ([place])   . Osteopenia   . Vitamin B12 deficiency     Past Surgical History:  Procedure Laterality Date  . ABDOMINAL HYSTERECTOMY  2010  . BLADDER SURGERY  2010   Bladder Tack  . CERVICAL SPINE SURGERY    . CHOLECYSTECTOMY    . HEMORRHOID SURGERY    . ROTATOR CUFF REPAIR    . SPINAL FUSION    . TUBAL LIGATION      Social History   Tobacco Use  . Smoking status: Former Smoker    Types: Cigarettes    Quit date: 06/27/1997    Years since quitting: 22.9  . Smokeless tobacco: Never Used  Vaping Use  . Vaping Use: Never used  Substance Use Topics  . Alcohol use: No  . Drug use: No    Family History  Problem Relation Age of Onset  . Diabetes Father   . Heart disease Father   . Colon polyps Mother   . Heart failure Mother   . Breast cancer Cousin   . Colon cancer Maternal Grandmother        ? may be duodenal cancer  . Colon polyps Brother   . Hypertension Brother   . Pancreatic cancer Maternal Uncle   . Esophageal cancer Neg Hx   . Rectal cancer Neg Hx   . Stomach cancer Neg Hx     Allergies  Allergen Reactions  . Sulfa Antibiotics Hives and Nausea And Vomiting  . Vytorin [Ezetimibe-Simvastatin]     White skin patch  . Aubagio [Teriflunomide]     diarrhea  . Cephalexin   . Doxycycline     Raw mouth   . Erythromycin     REACTION: weakness  . Macrobid [Nitrofurantoin Macrocrystal] Hives  . Morphine Nausea And Vomiting  . Rituxan [Rituximab]     Stated had issue with lungs after her last treatment  . Sulfonamide Derivatives Hives and Nausea And  Vomiting  . Amoxicillin Hives    Medication list has been reviewed and updated.  Current Outpatient Medications on File Prior to Visit  Medication Sig Dispense Refill  . b complex vitamins tablet Take 1 tablet by mouth daily.    . Cholecalciferol (VITAMIN D3) 5000 units TABS Take 5,000 Units by mouth daily.     . cyanocobalamin (,VITAMIN B-12,) 1000 MCG/ML injection inject 7m IM every month 12 mL 0  . desonide (DESOWEN) 0.05 % lotion Apply topically 2 (two) times daily. Use as needed for itchy ears 59 mL 1  . gabapentin (NEURONTIN) 600 MG tablet Take 1 tablet (600 mg total) by mouth 3 (three) times daily. 270 tablet 3  . Glucosamine  500 MG TABS Take 1 tablet by mouth 2 (two) times daily.    [name] Kitchen loratadine (CLARITIN) 10 MG tablet Take 1 tablet (10 mg total) by mouth daily as needed for allergies.    [name] Kitchen LORazepam (ATIVAN) 0.5 MG tablet Take 1 tablet (0.5 mg total) by mouth every 8 (eight) hours. Use as needed for anxiety 30 tablet 0  . memantine (NAMENDA) 10 MG tablet Take 1 tablet (10 mg total) by mouth 2 (two) times daily. 180 tablet 2  . Multiple Vitamin (MULTIVITAMIN) tablet Take 1 tablet by mouth daily.     . Probiotic Product (ALIGN PO) Take 1 tablet by mouth daily.     . rosuvastatin (CRESTOR) 20 MG tablet Take 1 tablet (20 mg total) by mouth daily. 90 tablet 3  . Syringe/Needle, Disp, (SYRINGE 3CC/25GX1") 25G X 1" 3 ML MISC Use with B12 injections 4 each 11  . Ubrogepant (UBRELVY) 50 MG TABS Take 50 mg by mouth as needed (take 1 at onset of headache; may repeat once 2 hours after intial dosing). 10 tablet 11   No current facility-administered medications on file prior to visit.    Review of Systems:  ***  Physical Examination: There were no vitals filed for this visit. There were no vitals filed for this visit. There is no height or weight on file to calculate BMI. Ideal Body Weight:    ***  Assessment and Plan: ***  Signed Lamar Blinks, MD

## 2020-06-13 ENCOUNTER — Ambulatory Visit: Payer: Medicare HMO | Admitting: Family Medicine

## 2020-06-14 ENCOUNTER — Other Ambulatory Visit: Payer: Self-pay

## 2020-06-14 ENCOUNTER — Ambulatory Visit (INDEPENDENT_AMBULATORY_CARE_PROVIDER_SITE_OTHER): Payer: Medicare HMO | Admitting: Medical

## 2020-06-14 ENCOUNTER — Encounter: Payer: Self-pay | Admitting: Medical

## 2020-06-14 ENCOUNTER — Other Ambulatory Visit (HOSPITAL_COMMUNITY): Payer: Self-pay | Admitting: Medical

## 2020-06-14 ENCOUNTER — Telehealth: Payer: Self-pay | Admitting: Medical

## 2020-06-14 VITALS — BP 130/80 | HR 100 | Ht 65.0 in | Wt 135.0 lb

## 2020-06-14 DIAGNOSIS — R319 Hematuria, unspecified: Secondary | ICD-10-CM

## 2020-06-14 DIAGNOSIS — R300 Dysuria: Secondary | ICD-10-CM

## 2020-06-14 LAB — POC URINALSYSI DIPSTICK (AUTOMATED)
Bilirubin, UA: NEGATIVE
Blood, UA: POSITIVE
Glucose, UA: NEGATIVE
Ketones, UA: NEGATIVE
Nitrite, UA: NEGATIVE
Protein, UA: POSITIVE — AB
Spec Grav, UA: 1.01 (ref 1.010–1.025)
Urobilinogen, UA: 0.2 E.U./dL
pH, UA: 7 (ref 5.0–8.0)

## 2020-06-14 MED ORDER — CIPROFLOXACIN HCL 500 MG PO TABS: 500.0000 mg | ORAL_TABLET | Freq: Two times a day (BID) | ORAL | 0 refills | Status: DC

## 2020-06-14 MED FILL — CIPROFLOXACIN HCL 500 MG TA: 500 | 7 days supply | Qty: 14 | Fill #0

## 2020-06-14 NOTE — Patient Instructions (Addendum)
You appear to have a urinary tract infection. I am prescribing cipro antibiotic for the probable infection. Hydrate well. I am sending out a urine culture. During the interim if your signs and symptoms worsen rather than improving please notify us. We will notify your when the culture results are back.  Repeat urine in 2 weeks to check for blood. Lab appointment.  History of smoking but none for years. Important to recheck urine for blood.  Follow up with pcp for routine check up in about a month as scheduled or as needed.

## 2020-06-14 NOTE — Progress Notes (Signed)
Subjective:    Patient ID: [name], female    DOB: 1940-11-15, 80 y.o.   MRN: [id number]  HPI  Pt in today reporting urinary symptoms x 1 day.  Dysuria- yes Frequent urination-yes Hesitancy-no Suprapubic pressure-yes/possible Fever-no chills-no Nausea-no Vomiting-no CVA pain-no History of UTI-occasional Gross hematuria-no Appears cloudy per pt.  Pt had high bp today. She just came from dentist office. On recheck bp was 130/80  Hx of smoking but none in 27 yrs. 1 pack a day for over 20 years.   Review of Systems  Constitutional: Negative for chills, fatigue and fever.  Respiratory: Negative for cough, chest tightness and shortness of breath.   Cardiovascular: Negative for chest pain and palpitations.  Gastrointestinal: Negative for abdominal pain.  Genitourinary: Positive for dysuria, frequency and urgency. Negative for vaginal pain.  Hematological: Negative for adenopathy. Does not bruise/bleed easily.  Psychiatric/Behavioral: Negative for behavioral problems and confusion.   Past Medical History:  Diagnosis Date  . Allergy   . Arthritis, degenerative    both knees  . B12 deficiency 07/06/2014  . Cancer ([place])   . Colitis   . Diverticulosis   . Duodenitis   . Fatty liver   . Gastritis   . GERD (gastroesophageal reflux disease)   . Heart murmur   . Helicobacter pylori gastritis   . Hyperlipidemia   . Hypertension    not treated, pt reports "no"  . IBD (inflammatory bowel disease)   . Lymphoma ([place])   . Memory difficulty 01/05/2013  . Memory loss    mild  . Multiple sclerosis ([place])   . Osteopenia   . Vitamin B12 deficiency      Social History   Socioeconomic History  . Marital status: Married    Spouse name: Eduard Clos   . Number of children: 4  . Years of education: 12+  . Highest education level: Not on file  Occupational History  . 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 Smoking status: Former Smoker    Types: Cigarettes    Quit date: 06/27/1997    Years since quitting: 22.9  . Smokeless tobacco: Never Used  Vaping Use  . Vaping Use: Never used  Substance and Sexual Activity  . Alcohol use: No  . Drug use: No  . Sexual activity: Not Currently  Other Topics Concern  . Not on file  Social History Narrative   Caffeine daily.    Patient lives at home with husband Eduard Clos but goes by [employer].    Patient has 4 children.    Patient has 1 year of college.    Patient is retired.    Patient is left handed.    Patient drinks 2 cups of coffee per day.   Social Determinants of Health   Financial Resource Strain: Low Risk   . Difficulty of Paying Living Expenses: Not hard at all  Food Insecurity: No Food Insecurity  . Worried About Charity fundraiser in the Last Year: Never true  . Ran Out of Food in the Last Year: Never true  Transportation Needs: No Transportation Needs  . Lack of Transportation (Medical): No  . Lack of Transportation (Non-Medical): No  Physical Activity: Not on file  Stress: Not on file  Social Connections: Not on file  Intimate Partner Violence: Not on file    Past Surgical History:  Procedure  Laterality Date  . ABDOMINAL HYSTERECTOMY  2010  . BLADDER SURGERY  2010   Bladder Tack  . CERVICAL SPINE SURGERY    . CHOLECYSTECTOMY    . HEMORRHOID SURGERY    . ROTATOR CUFF REPAIR    . SPINAL FUSION    . TUBAL LIGATION      Family History  Problem Relation Age of Onset  . Diabetes Father   . Heart disease Father   . Colon polyps Mother   . Heart failure Mother   . Breast cancer Cousin   . Colon cancer Maternal Grandmother        ? may be duodenal cancer  . Colon polyps Brother   . Hypertension Brother   . Pancreatic cancer Maternal Uncle   . Esophageal cancer Neg Hx   . Rectal cancer Neg Hx   . Stomach cancer Neg Hx     Allergies  Allergen Reactions  . Sulfa Antibiotics  Hives and Nausea And Vomiting  . Vytorin [Ezetimibe-Simvastatin]     White skin patch  . Aubagio [Teriflunomide]     diarrhea  . Cephalexin   . Doxycycline     Raw mouth   . Erythromycin     REACTION: weakness  . Macrobid [Nitrofurantoin Macrocrystal] Hives  . Morphine Nausea And Vomiting  . Rituxan [Rituximab]     Stated had issue with lungs after her last treatment  . Sulfonamide Derivatives Hives and Nausea And Vomiting  . Amoxicillin Hives    Current Outpatient Medications on File Prior to Visit  Medication Sig Dispense Refill  . b complex vitamins tablet Take 1 tablet by mouth daily.    . Cholecalciferol (VITAMIN D3) 5000 units TABS Take 5,000 Units by mouth daily.     . cyanocobalamin (,VITAMIN B-12,) 1000 MCG/ML injection inject 28m IM every month 12 mL 0  . desonide (DESOWEN) 0.05 % lotion Apply topically 2 (two) times daily. Use as needed for itchy ears 59 mL 1  . gabapentin (NEURONTIN) 600 MG tablet Take 1 tablet (600 mg total) by mouth 3 (three) times daily. 270 tablet 3  . Glucosamine 500 MG TABS Take 1 tablet by mouth 2 (two) times daily.    .[name] Kitchenloratadine (CLARITIN) 10 MG tablet Take 1 tablet (10 mg total) by mouth daily as needed for allergies.    .[name] KitchenLORazepam (ATIVAN) 0.5 MG tablet Take 1 tablet (0.5 mg total) by mouth every 8 (eight) hours. Use as needed for anxiety 30 tablet 0  . memantine (NAMENDA) 10 MG tablet Take 1 tablet (10 mg total) by mouth 2 (two) times daily. 180 tablet 2  . Multiple Vitamin (MULTIVITAMIN) tablet Take 1 tablet by mouth daily.     . Probiotic Product (ALIGN PO) Take 1 tablet by mouth daily.     . rosuvastatin (CRESTOR) 20 MG tablet Take 1 tablet (20 mg total) by mouth daily. 90 tablet 3  . Syringe/Needle, Disp, (SYRINGE 3CC/25GX1") 25G X 1" 3 ML MISC Use with B12 injections 4 each 11  . Ubrogepant (UBRELVY) 50 MG TABS Take 50 mg by mouth as needed (take 1 at onset of headache; may repeat once 2 hours after intial dosing). 10 tablet 11   No  current facility-administered medications on file prior to visit.    BP 130/80   Pulse 100   Ht 5' 5"  (1.651 m)   Wt 135 lb (61.2 kg)   SpO2 100%   BMI 22.47 kg/m  Objective:   Physical Exam  General- No acute distress. Pleasant patient. Neck- Full range of motion, no jvd Lungs- Clear, even and unlabored. Heart- regular rate and rhythm. Neurologic- CNII- XII grossly intact. Back- no cva tenderness.  Abdomen- soft, nt, nd,+bs, no suprapubic tenderness.      Assessment & Plan:  You appear to have a urinary tract infection. I am prescribing cipro antibiotic for the probable infection. Hydrate well. I am sending out a urine culture. During the interim if your signs and symptoms worsen rather than improving please notify us. We will notify your when the culture results are back.  Repeat urine in 2 weeks to check for blood. Lab appointment.  Follow up with pcp for routine check up in about a month as scheduled or as needed.

## 2020-06-14 NOTE — Telephone Encounter (Signed)
Future urine placed.

## 2020-06-16 LAB — URINE CULTURE
MICRO NUMBER:: 11603250
SPECIMEN QUALITY:: ADEQUATE

## 2020-06-18 DIAGNOSIS — Z1231 Encounter for screening mammogram for malignant neoplasm of breast: Secondary | ICD-10-CM | POA: Diagnosis not present

## 2020-06-18 LAB — HM MAMMOGRAPHY

## 2020-06-20 ENCOUNTER — Encounter: Payer: Self-pay | Admitting: Family Medicine

## 2020-06-28 ENCOUNTER — Other Ambulatory Visit (INDEPENDENT_AMBULATORY_CARE_PROVIDER_SITE_OTHER): Payer: Medicare HMO

## 2020-06-28 ENCOUNTER — Other Ambulatory Visit: Payer: Self-pay

## 2020-06-28 DIAGNOSIS — R319 Hematuria, unspecified: Secondary | ICD-10-CM

## 2020-06-28 LAB — POC URINALSYSI DIPSTICK (AUTOMATED)
Bilirubin, UA: NEGATIVE
Blood, UA: NEGATIVE
Glucose, UA: NEGATIVE
Ketones, UA: NEGATIVE
Leukocytes, UA: NEGATIVE
Nitrite, UA: NEGATIVE
Protein, UA: POSITIVE — AB
Spec Grav, UA: 1.02 (ref 1.010–1.025)
Urobilinogen, UA: 0.2 E.U./dL
pH, UA: 6 (ref 5.0–8.0)

## 2020-06-29 ENCOUNTER — Telehealth: Payer: Self-pay | Admitting: Family Medicine

## 2020-06-29 NOTE — Telephone Encounter (Signed)
Patient would like a call back in reference to her lab results.

## 2020-07-02 NOTE — Telephone Encounter (Signed)
Called patient, discussed results. Verbalized understanding.

## 2020-07-05 DIAGNOSIS — Z87891 Personal history of nicotine dependence: Secondary | ICD-10-CM | POA: Diagnosis not present

## 2020-07-05 DIAGNOSIS — C8339 Diffuse large B-cell lymphoma, extranodal and solid organ sites: Secondary | ICD-10-CM | POA: Diagnosis not present

## 2020-07-05 DIAGNOSIS — Z79899 Other long term (current) drug therapy: Secondary | ICD-10-CM | POA: Diagnosis not present

## 2020-07-05 LAB — BASIC METABOLIC PANEL
BUN: 22 — AB (ref 4–21)
CO2: 26 — AB (ref 13–22)
Chloride: 101 (ref 99–108)
Creatinine: 1.1 (ref 0.5–1.1)
Glucose: 108
Potassium: 3.7 (ref 3.4–5.3)
Sodium: 138 (ref 137–147)

## 2020-07-05 LAB — HEPATIC FUNCTION PANEL
ALT: 14 (ref 7–35)
AST: 30 (ref 13–35)
Alkaline Phosphatase: 47 (ref 25–125)
Bilirubin, Total: 0.6

## 2020-07-05 LAB — CBC AND DIFFERENTIAL
HCT: 40 (ref 36–46)
Hemoglobin: 12.8 (ref 12.0–16.0)
Platelets: 200 (ref 150–399)
WBC: 4.3

## 2020-07-05 LAB — COMPREHENSIVE METABOLIC PANEL
Albumin: 4.2 (ref 3.5–5.0)
Calcium: 9.2 (ref 8.7–10.7)
GFR calc non Af Amer: 48

## 2020-07-07 NOTE — Patient Instructions (Addendum)
It was great to see you again today, as always.  Assuming all is well please see me in about 6 months- we will update your B12 and bone density in the fall You are due for follow-up of thyroid nodule (ultrasound). I have ordered this for you- please stop by the imaging dept on the ground floor and schedule this.  They may be able to take care of it now If there is any issue getting your Brain MRI please let me know

## 2020-07-07 NOTE — Progress Notes (Signed)
[place] [facility] [address] [place], [place] [address] [phone]  Date:  07/11/2020   Name:  [name]   DOB:  1940/05/01   MRN:  [id number]  PCP:  Darreld Mclean, MD    Chief Complaint: Hyperlipidemia (6 month follow up/)   History of Present Illness:  [name] is a 80 y.o. very pleasant female patient who presents with the following:  Patient today for periodic follow-up visit.  Last seen by myself in September, she has been in a couple of times in the interim and seen my partners for various concerns  History of multiple sclerosis,osteopenia, Crohn's disease and diverticulitis, dilated cardiomyopathy, IBD.  She also has lymphoma which is being followed by Dr. Talbert Cage at [facility] Her husband Eduard Clos is also my patient  She saw Percell Miller earlier this month with concern of cystitis, start on Cipro Urine culture showed a Cipro sensitive Klebsiella infection This has seemed to resolve   Most recent oncology visit last week at [facility] treated her with Evusheld COVID-19 preventative injection.  They also got some lab work which will abstracted to her epic chart per clinic note: IMPRESSION(S): [name] is a 80 y.o. 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 PET/CT after 3 cycles R-CHOP showed treatment response ([place] 3). On August 8, she completed 6 cycles of R-CHOP chemotherapy. End of treatment PET/CT scan performed on September 12 shows continued complete metabolic response. There were bilateral airspace opacities consistent with rituximab induced pneumonitis. Shortness of breath has improved with prednisone. In November 2019, she had diverticulitis which resolved. She also developed neutropenia but bone marrow examination on March 25, 2018 was unrevealing. By the end of December, her neutrophil count recovered. She has been in remission  since.   PLAN(S): DLBCL dx 06/2017 : s/p 6 mini RCHOP and IT MTX, and IFRT radiation. Last PETCT 11/2018 showed CR. Labs look good today. Pt is clinically doing well in reference to lymphoma. ECOG 0  Rituximab induced pneumonitis per CT in 12/2017: Long resolved.  Multiple nodules /goiter per thyroid US 2020 and PETCT 11/2018  - Pt to follow up as needed with PCP or the doctor who has scheduled repeat neck US in fall 2022.  Headaches/Migraine. Increasing in frequency however no other worrisome features. She will ask her neurologist about ordering a repeat brain MRI as she prefers to get this test done close to home.  G1 UEs Neuropathy with MS hx with Left foot drop hx. She is followed by Neurology and most recent MRI in 09/2019 was stable. She is not on any MS medications. G1 diarrhea with Crohn's DZ. She has a gastroenterologist. She is not on any specific therapy for Crohn's and seems to be managing well. Health maintenance. Continue Covid precautions. Covid vaccine per guideline. Evushield today. Colonoscopy per GI. Mammogram / dexa per local PCP recommendations.    Most recent visit with cardiology, Dr. Bettina Gavia last month 1. She has mild cardiomyopathy likely a consequence of chemotherapy without evidence of heart failure and stable mildly reduced ejection fraction on recent echocardiogram.  At this time I continue our process of watchful waiting plan to repeat her echo September and see me afterwards and we will check a proBNP level today if severely elevated would change decision making.  She is comfortable with this careful cautious approach. 2. Stable continue high intensity statin with coronary artery  calcification and asked her to resume her over-the-counter fish oil 2 g/day 3. Stable no evidence of angina and no ischemia on perfusion study   She saw her gastroenterologist, Dr. Silverio Decamp in [facility], Crohn's clinically in remission She continues to be stable in this  regard  Neurology visit in October, [place] practitioner-currently treated with gabapentin, no other current MS medications.  She is seeing Dr Jannifer Franklin soon  She is also on Namenda for mild memory disturbance Some labs done in November  Lipids October   She has 6 grands total - ranging in age from 40 to 19yo  Thyroid US from 12/2018:  Will order follow-up today as not done yet  IMPRESSION: 1. Findings suggestive of multinodular goiter. 2. Nodule #5 meets imaging criteria to recommend a 1 year follow-up. 3. No discrete worrisome right-sided thyroid nodules, however by report, a right-sided thyroid nodule was seen on outside PET-CT. Correlation with outside report and images is advised. If there is an ultrasound correlate to a hypermetabolic thyroid nodule, consideration for ultrasound-guided fine-needle aspiration is advised as hypermetabolic thyroid nodules harbor a greater propensity for malignancy which supersedes sonographic evaluation. Patient Active Problem List   Diagnosis Date Noted  . Vitamin B12 deficiency   . Memory loss   . Lymphoma ([place])   . IBD (inflammatory bowel disease)   . Hypertension   . Hyperlipidemia   . Helicobacter pylori gastritis   . Heart murmur   . GERD (gastroesophageal reflux disease)   . Gastritis   . Fatty liver   . Diverticulosis   . Colitis   . Cancer ([place])   . Arthritis, degenerative   . Allergy   . New onset headache 09/26/2019  . Dilated cardiomyopathy ([place]) 03/02/2019  . Osteopenia 12/25/2018  . Hypokalemia 02/28/2018  . Sepsis ([place]) 02/27/2018  . Diverticulitis 02/27/2018  . Diffuse large B-cell lymphoma ([place]) 02/27/2018  . Osteoarthritis of cervical spine 09/15/2016  . Abnormality of gait 11/07/2015  . Dizziness and giddiness 11/07/2015  . Mild cognitive impairment 01/09/2015  . B12 deficiency 07/06/2014  . Leukopenia 05/06/2013  . Memory difficulty 01/05/2013  . Dyslipidemia 11/28/2008  . Multiple sclerosis ([place])  11/28/2008  . INFLAMMATORY BOWEL DISEASE 11/28/2008  . DIVERTICULOSIS, COLON 11/28/2008  . FATTY LIVER DISEASE 11/28/2008  . PERSONAL HISTORY OF PEPTIC ULCER DISEASE 11/28/2008    Past Medical History:  Diagnosis Date  . Allergy   . Arthritis, degenerative    both knees  . B12 deficiency 07/06/2014  . Cancer ([place])   . Colitis   . Diverticulosis   . Duodenitis   . Fatty liver   . Gastritis   . GERD (gastroesophageal reflux disease)   . Heart murmur   . Helicobacter pylori gastritis   . Hyperlipidemia   . Hypertension    not treated, pt reports "no"  . IBD (inflammatory bowel disease)   . Lymphoma ([place])   . Memory difficulty 01/05/2013  . Memory loss    mild  . Multiple sclerosis ([place])   . Osteopenia   . Vitamin B12 deficiency     Past Surgical History:  Procedure Laterality Date  . ABDOMINAL HYSTERECTOMY  2010  . BLADDER SURGERY  2010   Bladder Tack  . CERVICAL SPINE SURGERY    . CHOLECYSTECTOMY    . HEMORRHOID SURGERY    . ROTATOR CUFF REPAIR    . SPINAL FUSION    . TUBAL LIGATION      Social History   Tobacco Use  .  Smoking status: Former Smoker    Types: Cigarettes    Quit date: 06/27/1997    Years since quitting: 23.0  . Smokeless tobacco: Never Used  Vaping Use  . Vaping Use: Never used  Substance Use Topics  . Alcohol use: No  . Drug use: No    Family History  Problem Relation Age of Onset  . Diabetes Father   . Heart disease Father   . Colon polyps Mother   . Heart failure Mother   . Breast cancer Cousin   . Colon cancer Maternal Grandmother        ? may be duodenal cancer  . Colon polyps Brother   . Hypertension Brother   . Pancreatic cancer Maternal Uncle   . Esophageal cancer Neg Hx   . Rectal cancer Neg Hx   . Stomach cancer Neg Hx     Allergies  Allergen Reactions  . Sulfa Antibiotics Hives and Nausea And Vomiting  . Vytorin [Ezetimibe-Simvastatin]     White skin patch  . Aubagio [Teriflunomide]     diarrhea  . Cephalexin    . Doxycycline     Raw mouth   . Erythromycin     REACTION: weakness  . Macrobid [Nitrofurantoin Macrocrystal] Hives  . Morphine Nausea And Vomiting  . Rituxan [Rituximab]     Stated had issue with lungs after her last treatment  . Sulfonamide Derivatives Hives and Nausea And Vomiting  . Amoxicillin Hives    Medication list has been reviewed and updated.  Current Outpatient Medications on File Prior to Visit  Medication Sig Dispense Refill  . b complex vitamins tablet Take 1 tablet by mouth daily.    . Cholecalciferol (VITAMIN D3) 5000 units TABS Take 5,000 Units by mouth daily.     . cyanocobalamin (,VITAMIN B-12,) 1000 MCG/ML injection inject 1m IM every month 12 mL 0  . desonide (DESOWEN) 0.05 % lotion Apply topically 2 (two) times daily. Use as needed for itchy ears 59 mL 1  . gabapentin (NEURONTIN) 600 MG tablet Take 1 tablet (600 mg total) by mouth 3 (three) times daily. 270 tablet 3  . Glucosamine 500 MG TABS Take 1 tablet by mouth 2 (two) times daily.    .[name] Kitchenloratadine (CLARITIN) 10 MG tablet Take 1 tablet (10 mg total) by mouth daily as needed for allergies.    .[name] KitchenLORazepam (ATIVAN) 0.5 MG tablet Take 1 tablet (0.5 mg total) by mouth every 8 (eight) hours. Use as needed for anxiety 30 tablet 0  . memantine (NAMENDA) 10 MG tablet Take 1 tablet (10 mg total) by mouth 2 (two) times daily. 180 tablet 2  . Multiple Vitamin (MULTIVITAMIN) tablet Take 1 tablet by mouth daily.     . Probiotic Product (ALIGN PO) Take 1 tablet by mouth daily.     . rosuvastatin (CRESTOR) 20 MG tablet Take 1 tablet (20 mg total) by mouth daily. 90 tablet 3  . Syringe/Needle, Disp, (SYRINGE 3CC/25GX1") 25G X 1" 3 ML MISC Use with B12 injections 4 each 11   No current facility-administered medications on file prior to visit.    Review of Systems:  As per HPI- otherwise negative.   Physical Examination: Vitals:   07/11/20 0820  BP: 124/82  Pulse: 92  Resp: 17  SpO2: 99%   Vitals:   07/11/20  0820  Weight: 135 lb (61.2 kg)  Height: 5' 5"  (1.651 m)   Body mass index is 22.47 kg/m. Ideal Body Weight: Weight in (lb) to  have BMI = 25: 149.9  GEN: no acute distress.  Normal weight, looks well HEENT: Atraumatic, Normocephalic.  No palpable thyroid abnormality Bilateral TM wnl, oropharynx normal.  PEERL,EOMI.    Ears and Nose: No external deformity. CV: RRR, No M/G/R. No JVD. No thrill. No extra heart sounds. PULM: CTA B, no wheezes, crackles, rhonchi. No retractions. No resp. distress. No accessory muscle use. ABD: S, NT, ND, +BS. No rebound. No HSM. EXTR: No c/c/e PSYCH: Normally interactive. Conversant.    Assessment and Plan: Hyperlipidemia, unspecified hyperlipidemia type  Lymphoma of solid organ excluding spleen, unspecified lymphoma type ([place])  Multiple sclerosis ([place])  Thyroid nodule - Plan: US THYROID, CANCELED: US THYROID  Following up today.  Labs are up-to-date She plans to see her neurologist tomorrow to discuss her MS follow-up.  I have advised her if she needs me to order brain MRI I am glad to do so  Ordered thyroid ultrasound for follow-up today Plan to follow-up in 6 months assuming all is well She does need a thyroid US follow-up- will order for her today  Signed Lamar Blinks, MD

## 2020-07-11 ENCOUNTER — Encounter: Payer: Self-pay | Admitting: Family Medicine

## 2020-07-11 ENCOUNTER — Other Ambulatory Visit: Payer: Self-pay

## 2020-07-11 ENCOUNTER — Ambulatory Visit (INDEPENDENT_AMBULATORY_CARE_PROVIDER_SITE_OTHER): Payer: Medicare HMO | Admitting: Family Medicine

## 2020-07-11 ENCOUNTER — Ambulatory Visit (HOSPITAL_BASED_OUTPATIENT_CLINIC_OR_DEPARTMENT_OTHER)
Admission: RE | Admit: 2020-07-11 | Discharge: 2020-07-11 | Disposition: A | Discharge status: Home / Self Care | Payer: Medicare HMO | Source: Ambulatory Visit | Attending: Family Medicine | Admitting: Family Medicine

## 2020-07-11 ENCOUNTER — Other Ambulatory Visit: Payer: Self-pay | Admitting: Family Medicine

## 2020-07-11 VITALS — BP 124/82 | HR 92 | Resp 17 | Ht 65.0 in | Wt 135.0 lb

## 2020-07-11 DIAGNOSIS — E041 Nontoxic single thyroid nodule: Secondary | ICD-10-CM | POA: Diagnosis not present

## 2020-07-11 DIAGNOSIS — C8599 Non-Hodgkin lymphoma, unspecified, extranodal and solid organ sites: Secondary | ICD-10-CM | POA: Diagnosis not present

## 2020-07-11 DIAGNOSIS — G35 Multiple sclerosis: Secondary | ICD-10-CM

## 2020-07-11 DIAGNOSIS — E785 Hyperlipidemia, unspecified: Secondary | ICD-10-CM

## 2020-07-11 DIAGNOSIS — E042 Nontoxic multinodular goiter: Secondary | ICD-10-CM | POA: Diagnosis not present

## 2020-07-11 NOTE — Progress Notes (Signed)
IMPRESSION: 1. Nodule 3 (TI-RADS 4) located in the inferior left thyroid lobe, measuring 2.4 x 1.7 x 1.4 cm, has increased in size since prior ultrasound and increased an risk category. Further evaluation with FNA is recommended. Please note that this nodule was remeasured to match the technique of the prior ultrasound. 2. Remaining thyroid nodules do not meet criteria for FNA or imaging follow-up.

## 2020-07-12 ENCOUNTER — Ambulatory Visit: Payer: Medicare HMO | Admitting: Neurology

## 2020-07-12 ENCOUNTER — Encounter: Payer: Self-pay | Admitting: Neurology

## 2020-07-12 VITALS — BP 155/91 | HR 90 | Ht 65.0 in | Wt 135.2 lb

## 2020-07-12 DIAGNOSIS — G43019 Migraine without aura, intractable, without status migrainosus: Secondary | ICD-10-CM | POA: Diagnosis not present

## 2020-07-12 DIAGNOSIS — R269 Unspecified abnormalities of gait and mobility: Secondary | ICD-10-CM

## 2020-07-12 DIAGNOSIS — G35 Multiple sclerosis: Secondary | ICD-10-CM | POA: Diagnosis not present

## 2020-07-12 DIAGNOSIS — R413 Other amnesia: Secondary | ICD-10-CM | POA: Diagnosis not present

## 2020-07-12 HISTORY — DX: Migraine without aura, intractable, without status migrainosus: G43.019

## 2020-07-12 NOTE — Progress Notes (Signed)
Reason for visit: Multiple sclerosis, headache, history of large B-cell lymphoma, Crohn's disease  [name] is an 80 y.o. female  History of present illness:  [name] is a 80 year old left-handed white female with a history of multiple sclerosis.  The patient has done well over the years, she has been off of disease modifying agents after she could not tolerate Aubagio.  She has a history of lymphoma, she was treated with rituximab for this.  The patient has had some mild issues with memory, she believes that this has been stable over time, she is on Namenda.  She recently is being evaluated for thyroid nodule, she likely need a biopsy.  Over the last year she has had headaches that have if anything reduced in frequency.  The headaches tend to come on once every 3 weeks or so, usually in the frontal area, she notes that Excedrin Migraine and sleep will help the headache.  She does have a history of migraine in the past that was associated with her menstrual cycle.  The patient denies any new numbness or weakness of the face, arms, legs, she denies any changes in balance or vision.  She returns for further evaluation.  Past Medical History:  Diagnosis Date  . Allergy   . Arthritis, degenerative    both knees  . B12 deficiency 07/06/2014  . Cancer ([place])   . Colitis   . Common migraine with intractable migraine 07/12/2020  . Diverticulosis   . Duodenitis   . Fatty liver   . Gastritis   . GERD (gastroesophageal reflux disease)   . Heart murmur   . Helicobacter pylori gastritis   . Hyperlipidemia   . Hypertension    not treated, pt reports "no"  . IBD (inflammatory bowel disease)   . Lymphoma ([place])   . Memory difficulty 01/05/2013  . Memory loss    mild  . Multiple sclerosis ([place])   . Osteopenia   . Vitamin B12 deficiency     Past Surgical History:  Procedure Laterality Date  . ABDOMINAL HYSTERECTOMY  2010  . BLADDER SURGERY  2010   Bladder Tack  . CERVICAL SPINE  SURGERY    . CHOLECYSTECTOMY    . HEMORRHOID SURGERY    . ROTATOR CUFF REPAIR    . SPINAL FUSION    . TUBAL LIGATION      Family History  Problem Relation Age of Onset  . Diabetes Father   . Heart disease Father   . Colon polyps Mother   . Heart failure Mother   . Breast cancer Cousin   . Colon cancer Maternal Grandmother        ? may be duodenal cancer  . Colon polyps Brother   . Hypertension Brother   . Pancreatic cancer Maternal Uncle   . Esophageal cancer Neg Hx   . Rectal cancer Neg Hx   . Stomach cancer Neg Hx     Social history:  reports that she quit smoking about 23 years ago. Her smoking use included cigarettes. She has never used smokeless tobacco. She reports that she does not drink alcohol and does not use drugs.    Allergies  Allergen Reactions  . Sulfa Antibiotics Hives and Nausea And Vomiting  . Vytorin [Ezetimibe-Simvastatin]     White skin patch  . Aubagio [Teriflunomide]     diarrhea  . Cephalexin   . Doxycycline     Raw mouth   . Erythromycin     REACTION:  weakness  . Macrobid [Nitrofurantoin Macrocrystal] Hives  . Morphine Nausea And Vomiting  . Rituxan [Rituximab]     Stated had issue with lungs after her last treatment  . Sulfonamide Derivatives Hives and Nausea And Vomiting  . Amoxicillin Hives    Medications:  Prior to Admission medications   Medication Sig Start Date End Date Taking? Authorizing Provider  b complex vitamins tablet Take 1 tablet by mouth daily.    [provider]  Cholecalciferol (VITAMIN D3) 5000 units TABS Take 5,000 Units by mouth daily.     [provider]  cyanocobalamin (,VITAMIN B-12,) 1000 MCG/ML injection inject 28m IM every month 03/23/20   Nandigam, KVenia Minks MD  desonide (DESOWEN) 0.05 % lotion Apply topically 2 (two) times daily. Use as needed for itchy ears 11/03/19   Copland, JGay Filler MD  gabapentin (NEURONTIN) 600 MG tablet Take 1 tablet (600 mg total) by mouth 3 (three) times daily.  01/31/20   SSuzzanne Cloud NP  Glucosamine 500 MG TABS Take 1 tablet by mouth 2 (two) times daily.    [provider]  loratadine (CLARITIN) 10 MG tablet Take 1 tablet (10 mg total) by mouth daily as needed for allergies. 02/28/18   Rama, CVenetia Maxon MD  LORazepam (ATIVAN) 0.5 MG tablet Take 1 tablet (0.5 mg total) by mouth every 8 (eight) hours. Use as needed for anxiety 01/12/20   Copland, JGay Filler MD  memantine (NAMENDA) 10 MG tablet Take 1 tablet (10 mg total) by mouth 2 (two) times daily. 01/31/20   SSuzzanne Cloud NP  Multiple Vitamin (MULTIVITAMIN) tablet Take 1 tablet by mouth daily.     [provider]  Probiotic Product (ALIGN PO) Take 1 tablet by mouth daily.     [provider]  rosuvastatin (CRESTOR) 20 MG tablet Take 1 tablet (20 mg total) by mouth daily. 01/25/20   Copland, JGay Filler MD  Syringe/Needle, Disp, (SYRINGE 3CC/25GX1") 25G X 1" 3 ML MISC Use with B12 injections 02/24/19   Nandigam, KVenia Minks MD    ROS:  Out of a complete 14 system review of symptoms, the patient complains only of the following symptoms, and all other reviewed systems are negative.  Headache Mild memory changes  Blood pressure (!) 155/91, pulse 90, height 5' 5"  (1.651 m), weight 135 lb 3.2 oz (61.3 kg).  Physical Exam  General: The patient is alert and cooperative at the time of the examination.  Skin: No significant peripheral edema is noted.   Neurologic Exam  Mental status: The patient is alert and oriented x 3 at the time of the examination. The patient has apparent normal recent and remote memory, with an apparently normal attention span and concentration ability.   Cranial nerves: Facial symmetry is present. Speech is normal, no aphasia or dysarthria is noted. Extraocular movements are full. Visual fields are full.  Pupils are equal, round, and reactive to light.  Discs are flat bilaterally.  Motor: The patient has good strength in all 4  extremities.  Sensory examination: Soft touch sensation is symmetric on the face, arms, and legs.  Coordination: The patient has good finger-nose-finger and heel-to-shin bilaterally.  Gait and station: The patient has a normal gait. Tandem gait is very minimally unsteady. Romberg is negative. No drift is seen.  Reflexes: Deep tendon reflexes are symmetric.   MRI brain 10/11/19:  IMPRESSION:   MRI brain (with and without) demonstrating: - Stable, scattered periventricular and subcortical foci of non-specific T2 hyperintensities. No  abnormal lesions on post-contrast views.  - No acute findings.  * MRI scan images were reviewed online. I agree with the written report.    Assessment/Plan:  1.  Multiple sclerosis, off disease modifying agents  2.  History of Crohn's disease, stable  3.  History of large B-cell lymphoma  4.  Mild memory disturbance  5.  Frontal headache  The patient will remain on Namenda for now.  We will recheck MRI of the brain in the summer 2022 as the patient is not on disease modifying agents at this time.  The patient continues to have headaches but the headache frequency is improving.  She does have a prior history of migraine.  The patient will follow up here in 6 months.  When I retire, she desires to be followed by Dr. Felecia Shelling.  Jill Alexanders MD 07/12/2020 8:29 AM  [facility] [address] [place], [place] [address]  Phone [phone] Fax [phone]

## 2020-07-19 ENCOUNTER — Encounter: Payer: Self-pay | Admitting: Family Medicine

## 2020-07-19 LAB — LACTATE DEHYDROGENASE, ISOENZYMES: LDH: 107

## 2020-07-27 ENCOUNTER — Ambulatory Visit
Admission: RE | Admit: 2020-07-27 | Discharge: 2020-07-27 | Disposition: A | Discharge status: Home / Self Care | Payer: Medicare HMO | Source: Ambulatory Visit | Attending: Family Medicine | Admitting: Family Medicine

## 2020-07-27 ENCOUNTER — Other Ambulatory Visit (HOSPITAL_COMMUNITY)
Admission: RE | Admit: 2020-07-27 | Discharge: 2020-07-27 | Disposition: A | Discharge status: Home / Self Care | Payer: Medicare HMO | Source: Ambulatory Visit | Attending: Physician Assistant | Admitting: Physician Assistant

## 2020-07-27 DIAGNOSIS — D440 Neoplasm of uncertain behavior of thyroid gland: Secondary | ICD-10-CM | POA: Insufficient documentation

## 2020-07-27 DIAGNOSIS — E041 Nontoxic single thyroid nodule: Secondary | ICD-10-CM | POA: Diagnosis not present

## 2020-07-30 LAB — CYTOLOGY - NON PAP

## 2020-07-31 ENCOUNTER — Encounter: Payer: Self-pay | Admitting: Family Medicine

## 2020-07-31 DIAGNOSIS — H18423 Band keratopathy, bilateral: Secondary | ICD-10-CM | POA: Diagnosis not present

## 2020-07-31 DIAGNOSIS — H43811 Vitreous degeneration, right eye: Secondary | ICD-10-CM | POA: Diagnosis not present

## 2020-07-31 DIAGNOSIS — H35373 Puckering of macula, bilateral: Secondary | ICD-10-CM | POA: Diagnosis not present

## 2020-07-31 DIAGNOSIS — H43813 Vitreous degeneration, bilateral: Secondary | ICD-10-CM | POA: Diagnosis not present

## 2020-07-31 DIAGNOSIS — H31093 Other chorioretinal scars, bilateral: Secondary | ICD-10-CM | POA: Diagnosis not present

## 2020-07-31 DIAGNOSIS — H25811 Combined forms of age-related cataract, right eye: Secondary | ICD-10-CM | POA: Diagnosis not present

## 2020-07-31 DIAGNOSIS — H04123 Dry eye syndrome of bilateral lacrimal glands: Secondary | ICD-10-CM | POA: Diagnosis not present

## 2020-07-31 DIAGNOSIS — G35 Multiple sclerosis: Secondary | ICD-10-CM | POA: Diagnosis not present

## 2020-07-31 DIAGNOSIS — Z961 Presence of intraocular lens: Secondary | ICD-10-CM | POA: Diagnosis not present

## 2020-08-13 ENCOUNTER — Other Ambulatory Visit: Payer: Self-pay | Admitting: Neurology

## 2020-08-14 ENCOUNTER — Ambulatory Visit: Payer: Medicare HMO | Admitting: Family

## 2020-09-12 DIAGNOSIS — D225 Melanocytic nevi of trunk: Secondary | ICD-10-CM | POA: Diagnosis not present

## 2020-09-12 DIAGNOSIS — L720 Epidermal cyst: Secondary | ICD-10-CM | POA: Diagnosis not present

## 2020-09-12 DIAGNOSIS — D1801 Hemangioma of skin and subcutaneous tissue: Secondary | ICD-10-CM | POA: Diagnosis not present

## 2020-09-12 DIAGNOSIS — L649 Androgenic alopecia, unspecified: Secondary | ICD-10-CM | POA: Diagnosis not present

## 2020-09-12 DIAGNOSIS — B078 Other viral warts: Secondary | ICD-10-CM | POA: Diagnosis not present

## 2020-09-20 DIAGNOSIS — C8339 Diffuse large B-cell lymphoma, extranodal and solid organ sites: Secondary | ICD-10-CM | POA: Diagnosis not present

## 2020-09-26 ENCOUNTER — Ambulatory Visit: Payer: Medicare HMO | Attending: Internal Medicine

## 2020-09-26 DIAGNOSIS — Z23 Encounter for immunization: Secondary | ICD-10-CM

## 2020-09-26 NOTE — Progress Notes (Signed)
   Covid-19 Vaccination Clinic  Name:  [name]    MRN: [id number] DOB: 1940-08-19  09/26/2020  [name] was observed post Covid-19 immunization for 15 minutes without incident. She was provided with Vaccine Information Sheet and instruction to access the V-Safe system.   [name] was instructed to call 911 with any severe reactions post vaccine: Difficulty breathing  Swelling of face and throat  A fast heartbeat  A bad rash all over body  Dizziness and weakness   Immunizations Administered     Name Date Dose VIS Date Route   PFIZER Comrnaty(Gray TOP) Covid-19 Vaccine 09/26/2020 11:40 AM 0.3 mL 03/22/2020 Intramuscular   Manufacturer: [place]   Lot: [id number]   [place]: [phone]

## 2020-09-27 ENCOUNTER — Other Ambulatory Visit (HOSPITAL_BASED_OUTPATIENT_CLINIC_OR_DEPARTMENT_OTHER): Payer: Self-pay

## 2020-09-27 MED ORDER — COVID-19 MRNA VAC-TRIS(PFIZER) 30 MCG/0.3ML IM SUSP
INTRAMUSCULAR | 0 refills | Status: DC
  Filled 2020-09-27: qty 0.3, 1d supply, fill #0

## 2020-10-15 ENCOUNTER — Telehealth: Payer: Self-pay | Admitting: Neurology

## 2020-10-15 DIAGNOSIS — G35 Multiple sclerosis: Secondary | ICD-10-CM

## 2020-10-16 NOTE — Telephone Encounter (Signed)
I called the patient.  We planned on doing another MRI of the brain to compare to the one done last year, the patient is not on any disease modifying agents currently.  If she is amenable to having MRI done, she is to contact our office and leave me a message.

## 2020-10-17 NOTE — Telephone Encounter (Signed)
Patient called back and LVM regarding MRI. I returned her call, LVM asking her to call my direct number.

## 2020-10-19 DIAGNOSIS — R21 Rash and other nonspecific skin eruption: Secondary | ICD-10-CM | POA: Diagnosis not present

## 2020-10-29 ENCOUNTER — Encounter: Payer: Self-pay | Admitting: Family Medicine

## 2020-10-29 ENCOUNTER — Ambulatory Visit (INDEPENDENT_AMBULATORY_CARE_PROVIDER_SITE_OTHER): Payer: Medicare HMO | Admitting: Family Medicine

## 2020-10-29 ENCOUNTER — Other Ambulatory Visit: Payer: Self-pay

## 2020-10-29 VITALS — BP 108/62 | HR 103 | Temp 97.3°F | Resp 14 | Ht 65.0 in | Wt 136.0 lb

## 2020-10-29 DIAGNOSIS — R7303 Prediabetes: Secondary | ICD-10-CM | POA: Diagnosis not present

## 2020-10-29 DIAGNOSIS — L299 Pruritus, unspecified: Secondary | ICD-10-CM | POA: Diagnosis not present

## 2020-10-29 MED ORDER — PREDNISONE 5 MG PO TABS: ORAL_TABLET | ORAL | 0 refills | Status: DC

## 2020-10-29 NOTE — Addendum Note (Signed)
Addended by: Kathrynn Ducking on: 10/29/2020 05:14 PM   Modules accepted: Orders

## 2020-10-29 NOTE — Telephone Encounter (Signed)
I will order the MRI of the brain.

## 2020-10-29 NOTE — Telephone Encounter (Signed)
Spoke w/ patient, she would like to have MRI done. I let her know that Dr. Jannifer Franklin will get this ordered and she will be called to schedule.

## 2020-10-29 NOTE — Progress Notes (Addendum)
[facility] [place], [place], [place] [address] [phone]  Date:  10/29/2020   Name:  MIKIYAH Holder   DOB:  09-06-1940   MRN:  [id number]  PCP:  Darreld Mclean, MD    Chief Complaint: Rash (Base of beck, shoulders, chest, redness, tingling across back, itching, 2 weeks ago, given prednisone 5 mg improved but came back)   History of Present Illness:  [name] is a 80 y.o. very pleasant female patient who presents with the following:  Here today with concern of a rash on her chest, neck and shoulders Last seen by myself in March  History of multiple sclerosis, osteopenia, Crohn's disease and diverticulitis, dilated cardiomyopathy, IBD.  She also has lymphoma which is being followed by Dr. Talbert Cage at [facility] Her husband [name] is also my patient  Last seen by oncology in June - doing well, they planned for one year follow-up  She noted a rash which started about a month ago- started on her shoulder with itching and burning-the rash is difficult if not impossible to see, but she is noted this feeling of itching She tried cepaphil which helped some   She was seen at [facility] and was treated with prednisone taper for 5 days- started on 7/8 She finished the pred about one week ago The rash and itching were better while she was on the pred   She is not taking new medications, no new foods No change in her soaps, etc   She notes that she is sometimes fatigued but no other major symtpoms -no fevers or chills, generally she feels well   Patient Active Problem List   Diagnosis Date Noted   Common migraine with intractable migraine 07/12/2020   Vitamin B12 deficiency    Memory loss    Lymphoma ([place])    IBD (inflammatory bowel disease)    Hypertension    Hyperlipidemia    Helicobacter pylori gastritis    Heart murmur    GERD (gastroesophageal reflux disease)    Gastritis    Fatty liver    Diverticulosis     Colitis    Cancer ([place])    Arthritis, degenerative    Allergy    New onset headache 09/26/2019   Dilated cardiomyopathy ([place]) 03/02/2019   Osteopenia 12/25/2018   Hypokalemia 02/28/2018   Sepsis ([place]) 02/27/2018   Diverticulitis 02/27/2018   Diffuse large B-cell lymphoma ([place]) 02/27/2018   Osteoarthritis of cervical spine 09/15/2016   Abnormality of gait 11/07/2015   Dizziness and giddiness 11/07/2015   Mild cognitive impairment 01/09/2015   B12 deficiency 07/06/2014   Leukopenia 05/06/2013   Memory difficulty 01/05/2013   Dyslipidemia 11/28/2008   Multiple sclerosis ([place]) 11/28/2008   INFLAMMATORY BOWEL DISEASE 11/28/2008   DIVERTICULOSIS, COLON 11/28/2008   FATTY LIVER DISEASE 11/28/2008   PERSONAL HISTORY OF PEPTIC ULCER DISEASE 11/28/2008    Past Medical History:  Diagnosis Date   Allergy    Arthritis, degenerative    both knees   B12 deficiency 07/06/2014   Cancer ([place])    Colitis    Common migraine with intractable migraine 07/12/2020   Diverticulosis    Duodenitis    Fatty liver    Gastritis    GERD (gastroesophageal reflux disease)    Heart murmur    Helicobacter pylori gastritis    Hyperlipidemia    Hypertension    not treated, pt reports "no"   IBD (inflammatory bowel  disease)    Lymphoma ([place])    Memory difficulty 01/05/2013   Memory loss    mild   Multiple sclerosis ([place])    Osteopenia    Vitamin B12 deficiency     Past Surgical History:  Procedure Laterality Date   ABDOMINAL HYSTERECTOMY  2010   BLADDER SURGERY  2010   Bladder Tack   CERVICAL SPINE SURGERY     CHOLECYSTECTOMY     HEMORRHOID SURGERY     ROTATOR CUFF REPAIR     SPINAL FUSION     TUBAL LIGATION      Social History   Tobacco Use   Smoking status: Former    Types: Cigarettes    Quit date: 06/27/1997    Years since quitting: 23.3   Smokeless tobacco: Never  Vaping Use   Vaping Use: Never used  Substance Use Topics   Alcohol use: No   Drug use: No    Family History   Problem Relation Age of Onset   Diabetes Father    Heart disease Father    Colon polyps Mother    Heart failure Mother    Breast cancer Cousin    Colon cancer Maternal Grandmother        ? may be duodenal cancer   Colon polyps Brother    Hypertension Brother    Pancreatic cancer Maternal Uncle    Esophageal cancer Neg Hx    Rectal cancer Neg Hx    Stomach cancer Neg Hx     Allergies  Allergen Reactions   Sulfa Antibiotics Hives and Nausea And Vomiting   Vytorin [Ezetimibe-Simvastatin]     White skin patch   Aubagio [Teriflunomide]     diarrhea   Cephalexin    Doxycycline     Raw mouth    Erythromycin     REACTION: weakness   Macrobid [Nitrofurantoin Macrocrystal] Hives   Morphine Nausea And Vomiting   Rituxan [Rituximab]     Stated had issue with lungs after her last treatment   Sulfonamide Derivatives Hives and Nausea And Vomiting   Amoxicillin Hives    Medication list has been reviewed and updated.  Current Outpatient Medications on File Prior to Visit  Medication Sig Dispense Refill   b complex vitamins tablet Take 1 tablet by mouth daily.     Cholecalciferol (VITAMIN D3) 5000 units TABS Take 5,000 Units by mouth daily.      cyanocobalamin (,VITAMIN B-12,) 1000 MCG/ML injection inject 51m IM every month 12 mL 0   gabapentin (NEURONTIN) 600 MG tablet Take 1 tablet (600 mg total) by mouth 3 (three) times daily. 270 tablet 3   Glucosamine 500 MG TABS Take 1 tablet by mouth 2 (two) times daily.     loratadine (CLARITIN) 10 MG tablet Take 1 tablet (10 mg total) by mouth daily as needed for allergies.     LORazepam (ATIVAN) 0.5 MG tablet Take 1 tablet (0.5 mg total) by mouth every 8 (eight) hours. Use as needed for anxiety 30 tablet 0   memantine (NAMENDA) 10 MG tablet TAKE 1 TABLET BY MOUTH TWICE A DAY 180 tablet 3   Multiple Vitamin (MULTIVITAMIN) tablet Take 1 tablet by mouth daily.      Probiotic Product (ALIGN PO) Take 1 tablet by mouth daily.      rosuvastatin  (CRESTOR) 20 MG tablet Take 1 tablet (20 mg total) by mouth daily. 90 tablet 3   Syringe/Needle, Disp, (SYRINGE 3CC/25GX1") 25G X 1" 3 ML MISC Use with B12  injections 4 each 11   No current facility-administered medications on file prior to visit.    Review of Systems:  As per HPI- otherwise negative.   Physical Examination: Vitals:   10/29/20 1343  BP: 108/62  Pulse: (!) 103  Resp: 14  Temp: (!) 97.3 F (36.3 C)  SpO2: 97%   Vitals:   10/29/20 1343  Weight: 136 lb (61.7 kg)  Height: 5' 5"  (1.651 m)   Body mass index is 22.63 kg/m. Ideal Body Weight: Weight in (lb) to have BMI = 25: 149.9  Pulse Readings from Last 3 Encounters:  10/29/20 (!) 103  07/12/20 90  07/11/20 92    GEN: no acute distress.  Normal weight, looks well HEENT: Atraumatic, Normocephalic.  Bilateral TM wnl, oropharynx normal.  PEERL,EOMI.   Ears and Nose: No external deformity. CV: RRR, No M/G/R. No JVD. No thrill. No extra heart sounds. PULM: CTA B, no wheezes, crackles, rhonchi. No retractions. No resp. distress. No accessory muscle use. EXTR: No c/c/e PSYCH: Normally interactive. Conversant.  There is some minimal erythema of the skin over her right collarbone.  Otherwise I am unable to appreciate any particular rash.  She notes itching in her posterior hairline and over her her right shoulder   Assessment and Plan: Itching - Plan: CBC, Comprehensive metabolic panel, predniSONE (DELTASONE) 5 MG tablet  Pre-diabetes - Plan: Hemoglobin A1c Patient seen today with concern of itching and poorly defined rash.  No apparent trigger for her symptoms.  She did seem to respond to prednisone.  We will have her take a low-dose longer taper for 10 days.  In addition, we will follow-up on her labs as above and check her A1c Will plan further follow- up pending labs.  This visit occurred during the SARS-CoV-2 public health emergency.  Safety protocols were in place, including screening questions prior to  the visit, additional usage of staff PPE, and extensive cleaning of exam room while observing appropriate contact time as indicated for disinfecting solutions.   Signed Lamar Blinks, MD  Received labs as below, 7/19-message to patient  Results for orders placed or performed in visit on 10/29/20  CBC  Result Value Ref Range   WBC 5.2 4.0 - 10.5 K/uL   RBC 4.30 3.87 - 5.11 Mil/uL   Platelets 220.0 150.0 - 400.0 K/uL   Hemoglobin 13.6 12.0 - 15.0 g/dL   HCT 41.1 36.0 - 46.0 %   MCV 95.7 78.0 - 100.0 fl   MCHC 33.0 30.0 - 36.0 g/dL   RDW 14.4 11.5 - 15.5 %  Comprehensive metabolic panel  Result Value Ref Range   Sodium 137 135 - 145 mEq/L   Potassium 4.5 3.5 - 5.1 mEq/L   Chloride 101 96 - 112 mEq/L   CO2 29 19 - 32 mEq/L   Glucose, Bld 114 (H) 70 - 99 mg/dL   BUN 22 6 - 23 mg/dL   Creatinine, Ser 1.14 0.40 - 1.20 mg/dL   Total Bilirubin 0.9 0.2 - 1.2 mg/dL   Alkaline Phosphatase 52 39 - 117 U/L   AST 31 0 - 37 U/L   ALT 16 0 - 35 U/L   Total Protein 6.8 6.0 - 8.3 g/dL   Albumin 4.3 3.5 - 5.2 g/dL   GFR 45.95 (L) >60.00 mL/min   Calcium 10.2 8.4 - 10.5 mg/dL  Hemoglobin A1c  Result Value Ref Range   Hgb A1c MFr Bld 6.4 4.6 - 6.5 %

## 2020-10-29 NOTE — Patient Instructions (Signed)
Good to see you again today- I will be in touch with your labs asap Please let me know how you do with the low dose of prednisone

## 2020-10-30 ENCOUNTER — Encounter: Payer: Self-pay | Admitting: Family Medicine

## 2020-10-30 ENCOUNTER — Telehealth: Payer: Self-pay | Admitting: Neurology

## 2020-10-30 DIAGNOSIS — R7303 Prediabetes: Secondary | ICD-10-CM | POA: Insufficient documentation

## 2020-10-30 LAB — COMPREHENSIVE METABOLIC PANEL
ALT: 16 U/L (ref 0–35)
AST: 31 U/L (ref 0–37)
Albumin: 4.3 g/dL (ref 3.5–5.2)
Alkaline Phosphatase: 52 U/L (ref 39–117)
BUN: 22 mg/dL (ref 6–23)
CO2: 29 mEq/L (ref 19–32)
Calcium: 10.2 mg/dL (ref 8.4–10.5)
Chloride: 101 mEq/L (ref 96–112)
Creatinine, Ser: 1.14 mg/dL (ref 0.40–1.20)
GFR: 45.95 mL/min — ABNORMAL LOW (ref >60.00)
Glucose, Bld: 114 mg/dL — ABNORMAL HIGH (ref 70–99)
Potassium: 4.5 mEq/L (ref 3.5–5.1)
Sodium: 137 mEq/L (ref 135–145)
Total Bilirubin: 0.9 mg/dL (ref 0.2–1.2)
Total Protein: 6.8 g/dL (ref 6.0–8.3)

## 2020-10-30 LAB — CBC
HCT: 41.1 % (ref 36.0–46.0)
Hemoglobin: 13.6 g/dL (ref 12.0–15.0)
MCHC: 33 g/dL (ref 30.0–36.0)
MCV: 95.7 fl (ref 78.0–100.0)
Platelets: 220 10*3/uL (ref 150.0–400.0)
RBC: 4.3 Mil/uL (ref 3.87–5.11)
RDW: 14.4 % (ref 11.5–15.5)
WBC: 5.2 10*3/uL (ref 4.0–10.5)

## 2020-10-30 LAB — HEMOGLOBIN A1C: Hgb A1c MFr Bld: 6.4 % (ref 4.6–6.5)

## 2020-10-30 NOTE — Telephone Encounter (Signed)
MRI brain w/wo contrast [employer] Medicare auth: GI obtains  Sent to GI for scheduling

## 2020-11-02 ENCOUNTER — Ambulatory Visit
Admission: RE | Admit: 2020-11-02 | Discharge: 2020-11-02 | Disposition: A | Discharge status: Home / Self Care | Payer: Medicare HMO | Source: Ambulatory Visit | Attending: Neurology | Admitting: Neurology

## 2020-11-02 ENCOUNTER — Other Ambulatory Visit: Payer: Self-pay

## 2020-11-02 DIAGNOSIS — G35 Multiple sclerosis: Secondary | ICD-10-CM

## 2020-11-02 MED ORDER — GADOBENATE DIMEGLUMINE 529 MG/ML IV SOLN
10.0000 mL | Freq: Once | INTRAVENOUS | Status: AC | PRN
  Administered 2020-11-02: 10 mL via INTRAVENOUS

## 2020-11-05 ENCOUNTER — Telehealth: Payer: Self-pay | Admitting: Neurology

## 2020-11-05 NOTE — Telephone Encounter (Signed)
  I called the patient.  MRI of the brain is stable from 2019/2021.  No new lesions seen.  The patient is not on any disease modifying agents.   MRI brain 11/02/20:  IMPRESSION: This MRI of the brain with and without contrast shows the following: 1.   Multiple T2/FLAIR hyperintense foci in the hemispheres and a couple foci in the cerebellum.  These are nonspecific and the majority of the foci have an appearance most consistent with chronic microvascular ischemic change.  Overlap with demyelination is possible.  None of the foci appear to be acute.  They do not enhance.  Compared to the MRI from 2019 2021, there are no new lesions. 2.   Mild stable generalized cortical atrophy.   3.   No acute findings.  Normal enhancement pattern.

## 2020-11-08 DIAGNOSIS — R21 Rash and other nonspecific skin eruption: Secondary | ICD-10-CM | POA: Diagnosis not present

## 2020-11-22 DIAGNOSIS — L509 Urticaria, unspecified: Secondary | ICD-10-CM | POA: Diagnosis not present

## 2020-11-22 DIAGNOSIS — Z79899 Other long term (current) drug therapy: Secondary | ICD-10-CM | POA: Diagnosis not present

## 2020-11-26 ENCOUNTER — Telehealth: Payer: Self-pay | Admitting: Family Medicine

## 2020-11-26 NOTE — Telephone Encounter (Signed)
Left message for patient to call back and schedule Medicare Annual Wellness Visit (AWV) in office.   If not able to come in office, please offer to do virtually or by telephone.  Left office number and my jabber [phone].  Last AWV:06/28/2019  Please schedule at anytime with Nurse Health Advisor.

## 2020-11-30 DIAGNOSIS — C8339 Diffuse large B-cell lymphoma, extranodal and solid organ sites: Secondary | ICD-10-CM | POA: Diagnosis not present

## 2020-12-07 ENCOUNTER — Telehealth: Payer: Self-pay | Admitting: Family Medicine

## 2020-12-07 NOTE — Telephone Encounter (Signed)
Pt. Is returning call: she can be reached at:  [phone] or [phone]

## 2020-12-10 ENCOUNTER — Other Ambulatory Visit: Payer: Self-pay | Admitting: Family Medicine

## 2020-12-10 DIAGNOSIS — L299 Pruritus, unspecified: Secondary | ICD-10-CM

## 2020-12-10 DIAGNOSIS — J3089 Other allergic rhinitis: Secondary | ICD-10-CM | POA: Diagnosis not present

## 2020-12-10 DIAGNOSIS — E785 Hyperlipidemia, unspecified: Secondary | ICD-10-CM

## 2020-12-10 DIAGNOSIS — J301 Allergic rhinitis due to pollen: Secondary | ICD-10-CM | POA: Diagnosis not present

## 2020-12-10 DIAGNOSIS — L2089 Other atopic dermatitis: Secondary | ICD-10-CM | POA: Diagnosis not present

## 2020-12-10 DIAGNOSIS — J3081 Allergic rhinitis due to animal (cat) (dog) hair and dander: Secondary | ICD-10-CM | POA: Diagnosis not present

## 2020-12-14 ENCOUNTER — Other Ambulatory Visit: Payer: Self-pay

## 2020-12-14 ENCOUNTER — Ambulatory Visit (HOSPITAL_BASED_OUTPATIENT_CLINIC_OR_DEPARTMENT_OTHER)
Admission: RE | Admit: 2020-12-14 | Discharge: 2020-12-14 | Disposition: A | Discharge status: Home / Self Care | Payer: Medicare HMO | Source: Ambulatory Visit | Attending: Cardiology | Admitting: Cardiology

## 2020-12-14 DIAGNOSIS — I420 Dilated cardiomyopathy: Secondary | ICD-10-CM

## 2020-12-18 NOTE — Telephone Encounter (Signed)
Patient scheduled AWV on 01/09/21.

## 2020-12-19 ENCOUNTER — Telehealth: Payer: Self-pay

## 2020-12-19 LAB — ECHOCARDIOGRAM COMPLETE
AR max vel: 2.7 cm2
AV Peak grad: 5.5 mmHg
Ao pk vel: 1.17 m/s
Area-P 1/2: 4.87 cm2
Calc EF: 50.8 %
P 1/2 time: 644 msec
S' Lateral: 3.09 cm
Single Plane A2C EF: 51.4 %
Single Plane A4C EF: 50.3 %

## 2020-12-19 NOTE — Telephone Encounter (Signed)
Spoke with patient regarding results and recommendation.  Patient verbalizes understanding and is agreeable to plan of care. Advised patient to call back with any issues or concerns.  

## 2020-12-19 NOTE — Telephone Encounter (Signed)
-----   Message from Richardo Priest, MD sent at 12/19/2020  9:31 AM EDT ----- Regarding: FW: Stable EF% Good result ----- Message ----- From: Interface, [employer] Sent: 12/19/2020   8:07 AM EDT To: Richardo Priest, MD

## 2020-12-24 ENCOUNTER — Other Ambulatory Visit (HOSPITAL_BASED_OUTPATIENT_CLINIC_OR_DEPARTMENT_OTHER): Payer: Self-pay

## 2020-12-28 ENCOUNTER — Other Ambulatory Visit: Payer: Self-pay | Admitting: Family Medicine

## 2020-12-28 DIAGNOSIS — E785 Hyperlipidemia, unspecified: Secondary | ICD-10-CM

## 2021-01-09 ENCOUNTER — Ambulatory Visit (INDEPENDENT_AMBULATORY_CARE_PROVIDER_SITE_OTHER): Payer: Medicare HMO

## 2021-01-09 VITALS — Ht 65.0 in | Wt 136.0 lb

## 2021-01-09 DIAGNOSIS — Z0000 Encounter for general adult medical examination without abnormal findings: Secondary | ICD-10-CM | POA: Diagnosis not present

## 2021-01-09 DIAGNOSIS — Z780 Asymptomatic menopausal state: Secondary | ICD-10-CM

## 2021-01-09 NOTE — Patient Instructions (Signed)
[name] , Thank you for taking time to complete your Medicare Wellness Visit. I appreciate your ongoing commitment to your health goals. Please review the following plan we discussed and let me know if I can assist you in the future.   Screening recommendations/referrals: Colonoscopy: Completed 03/23/2019-Due 03/22/2022 Mammogram: Completed 3/7/022-Due 06/18/2021 Bone Density: Ordered today.. Someone will call you to schedule. Recommended yearly ophthalmology/optometry visit for glaucoma screening and checkup Recommended yearly dental visit for hygiene and checkup  Vaccinations: Influenza vaccine: Due-May obtain vaccine at our office or your local pharmacy. Pneumococcal vaccine: Up to date Tdap vaccine: Up to date-Due-12/12/2028 Shingles vaccine: Discuss with pharmacy   Covid-19:Booster available at the pharmacy.  Advanced directives: Declined information today.  Conditions/risks identified: See problem list  Next appointment: Follow up in one year for your annual wellness visit    Preventive Care 65 Years and Older, Female Preventive care refers to lifestyle choices and visits with your health care provider that can promote health and wellness. What does preventive care include? A yearly physical exam. This is also called an annual well check. Dental exams once or twice a year. Routine eye exams. Ask your health care provider how often you should have your eyes checked. Personal lifestyle choices, including: Daily care of your teeth and gums. Regular physical activity. Eating a healthy diet. Avoiding tobacco and drug use. Limiting alcohol use. Practicing safe sex. Taking low-dose aspirin every day. Taking vitamin and mineral supplements as recommended by your health care provider. What happens during an annual well check? The services and screenings done by your health care provider during your annual well check will depend on your age, overall health, lifestyle risk factors,  and family history of disease. Counseling  Your health care provider may ask you questions about your: Alcohol use. Tobacco use. Drug use. Emotional well-being. Home and relationship well-being. Sexual activity. Eating habits. History of falls. Memory and ability to understand (cognition). Work and work Statistician. Reproductive health. Screening  You may have the following tests or measurements: Height, weight, and BMI. Blood pressure. Lipid and cholesterol levels. These may be checked every 5 years, or more frequently if you are over 54 years old. Skin check. Lung cancer screening. You may have this screening every year starting at age 78 if you have a 30-pack-year history of smoking and currently smoke or have quit within the past 15 years. Fecal occult blood test (FOBT) of the stool. You may have this test every year starting at age 55. Flexible sigmoidoscopy or colonoscopy. You may have a sigmoidoscopy every 5 years or a colonoscopy every 10 years starting at age 46. Hepatitis C blood test. Hepatitis B blood test. Sexually transmitted disease (STD) testing. Diabetes screening. This is done by checking your blood sugar (glucose) after you have not eaten for a while (fasting). You may have this done every 1-3 years. Bone density scan. This is done to screen for osteoporosis. You may have this done starting at age 28. Mammogram. This may be done every 1-2 years. Talk to your health care provider about how often you should have regular mammograms. Talk with your health care provider about your test results, treatment options, and if necessary, the need for more tests. Vaccines  Your health care provider may recommend certain vaccines, such as: Influenza vaccine. This is recommended every year. Tetanus, diphtheria, and acellular pertussis (Tdap, Td) vaccine. You may need a Td booster every 10 years. Zoster vaccine. You may need this after age 99. Pneumococcal 13-valent  conjugate  (PCV13) vaccine. One dose is recommended after age 44. Pneumococcal polysaccharide (PPSV23) vaccine. One dose is recommended after age 81. Talk to your health care provider about which screenings and vaccines you need and how often you need them. This information is not intended to replace advice given to you by your health care provider. Make sure you discuss any questions you have with your health care provider. Document Released: 04/27/2015 Document Revised: 12/19/2015 Document Reviewed: 01/30/2015 Elsevier Interactive Patient Education  2017 [place] Prevention in the Home Falls can cause injuries. They can happen to people of all ages. There are many things you can do to make your home safe and to help prevent falls. What can I do on the outside of my home? Regularly fix the edges of walkways and driveways and fix any cracks. Remove anything that might make you trip as you walk through a door, such as a raised step or threshold. Trim any bushes or trees on the path to your home. Use bright outdoor lighting. Clear any walking paths of anything that might make someone trip, such as rocks or tools. Regularly check to see if handrails are loose or broken. Make sure that both sides of any steps have handrails. Any raised decks and porches should have guardrails on the edges. Have any leaves, snow, or ice cleared regularly. Use sand or salt on walking paths during winter. Clean up any spills in your garage right away. This includes oil or grease spills. What can I do in the bathroom? Use night lights. Install grab bars by the toilet and in the tub and shower. Do not use towel bars as grab bars. Use non-skid mats or decals in the tub or shower. If you need to sit down in the shower, use a plastic, non-slip stool. Keep the floor dry. Clean up any water that spills on the floor as soon as it happens. Remove soap buildup in the tub or shower regularly. Attach bath mats securely with  double-sided non-slip rug tape. Do not have throw rugs and other things on the floor that can make you trip. What can I do in the bedroom? Use night lights. Make sure that you have a light by your bed that is easy to reach. Do not use any sheets or blankets that are too big for your bed. They should not hang down onto the floor. Have a firm chair that has side arms. You can use this for support while you get dressed. Do not have throw rugs and other things on the floor that can make you trip. What can I do in the kitchen? Clean up any spills right away. Avoid walking on wet floors. Keep items that you use a lot in easy-to-reach places. If you need to reach something above you, use a strong step stool that has a grab bar. Keep electrical cords out of the way. Do not use floor polish or wax that makes floors slippery. If you must use wax, use non-skid floor wax. Do not have throw rugs and other things on the floor that can make you trip. What can I do with my stairs? Do not leave any items on the stairs. Make sure that there are handrails on both sides of the stairs and use them. Fix handrails that are broken or loose. Make sure that handrails are as long as the stairways. Check any carpeting to make sure that it is firmly attached to the stairs. Fix any carpet that  is loose or worn. Avoid having throw rugs at the top or bottom of the stairs. If you do have throw rugs, attach them to the floor with carpet tape. Make sure that you have a light switch at the top of the stairs and the bottom of the stairs. If you do not have them, ask someone to add them for you. What else can I do to help prevent falls? Wear shoes that: Do not have high heels. Have rubber bottoms. Are comfortable and fit you well. Are closed at the toe. Do not wear sandals. If you use a stepladder: Make sure that it is fully opened. Do not climb a closed stepladder. Make sure that both sides of the stepladder are locked  into place. Ask someone to hold it for you, if possible. Clearly mark and make sure that you can see: Any grab bars or handrails. First and last steps. Where the edge of each step is. Use tools that help you move around (mobility aids) if they are needed. These include: Canes. Walkers. Scooters. Crutches. Turn on the lights when you go into a dark area. Replace any light bulbs as soon as they burn out. Set up your furniture so you have a clear path. Avoid moving your furniture around. If any of your floors are uneven, fix them. If there are any pets around you, be aware of where they are. Review your medicines with your doctor. Some medicines can make you feel dizzy. This can increase your chance of falling. Ask your doctor what other things that you can do to help prevent falls. This information is not intended to replace advice given to you by your health care provider. Make sure you discuss any questions you have with your health care provider. Document Released: 01/25/2009 Document Revised: 09/06/2015 Document Reviewed: 05/05/2014 Elsevier Interactive Patient Education  2017 [employer].

## 2021-01-09 NOTE — Progress Notes (Signed)
Subjective:   [name] is a 80 y.o. female who presents for Medicare Annual (Subsequent) preventive examination.  I connected with Clio today by telephone and verified that I am speaking with the correct person using two identifiers. Location patient: home Location provider: work Persons participating in the virtual visit: patient, Marine scientist.    I discussed the limitations, risks, security and privacy concerns of performing an evaluation and management service by telephone and the availability of in person appointments. I also discussed with the patient that there may be a patient responsible charge related to this service. The patient expressed understanding and verbally consented to this telephonic visit.    Interactive audio and video telecommunications were attempted between this provider and patient, however failed, due to patient having technical difficulties OR patient did not have access to video capability.  We continued and completed visit with audio only.  Some vital signs may be absent or patient reported.   Time Spent with patient on telephone encounter: 20 minutes   Review of Systems     Cardiac Risk Factors include: advanced age (>1mn, >>32women);dyslipidemia;hypertension     Objective:    Today's Vitals   01/09/21 0821  Weight: 136 lb (61.7 kg)  Height: 5' 5"  (1.651 m)   Body mass index is 22.63 kg/m.  Advanced Directives 01/09/2021 02/13/2020 06/28/2019 02/27/2018 02/27/2018 09/18/2016 01/17/2016  Does Patient Have a Medical Advance Directive? No No No No No No No  Does patient want to make changes to medical advance directive? - - No - Patient declined - - - -  Would patient like information on creating a medical advance directive? No - Patient declined No - Patient declined - No - Patient declined No - Patient declined No - Patient declined No - patient declined information    Current Medications (verified) Outpatient Encounter Medications as of 01/09/2021   Medication Sig   b complex vitamins tablet Take 1 tablet by mouth daily.   Cholecalciferol (VITAMIN D3) 5000 units TABS Take 5,000 Units by mouth daily.    desonide (DESOWEN) 0.05 % lotion APPLY TOPICALLY 2 (TWO) TIMES DAILY. USE AS NEEDED FOR ITCHY EARS   fluocinonide ointment (LIDEX) 04.23% Apply 1 application topically 2 (two) times daily as needed.   gabapentin (NEURONTIN) 600 MG tablet Take 1 tablet (600 mg total) by mouth 3 (three) times daily.   Glucosamine 500 MG TABS Take 1 tablet by mouth 2 (two) times daily.   levocetirizine (XYZAL) 5 MG tablet Take 5 mg by mouth every evening.   loratadine (CLARITIN) 10 MG tablet Take 1 tablet (10 mg total) by mouth daily as needed for allergies.   memantine (NAMENDA) 10 MG tablet TAKE 1 TABLET BY MOUTH TWICE A DAY   Multiple Vitamin (MULTIVITAMIN) tablet Take 1 tablet by mouth daily.    Probiotic Product (ALIGN PO) Take 1 tablet by mouth daily.    rosuvastatin (CRESTOR) 20 MG tablet TAKE 1 TABLET BY MOUTH EVERY DAY   tacrolimus (PROTOPIC) 0.1 % ointment SMARTSIG:Sparingly Topical Twice Daily PRN   cyanocobalamin (,VITAMIN B-12,) 1000 MCG/ML injection inject 140mIM every month (Patient not taking: Reported on 01/09/2021)   fenofibrate (TRICOR) 145 MG tablet TAKE 1 TABLET BY MOUTH EVERY DAY (Patient not taking: Reported on 01/09/2021)   LORazepam (ATIVAN) 0.5 MG tablet Take 1 tablet (0.5 mg total) by mouth every 8 (eight) hours. Use as needed for anxiety (Patient not taking: Reported on 01/09/2021)   Syringe/Needle, Disp, (SYRINGE 3CC/25GX1") 25G X  1" 3 ML MISC Use with B12 injections (Patient not taking: Reported on 01/09/2021)   [DISCONTINUED] predniSONE (DELTASONE) 5 MG tablet Take 10 mg daily for 5 days, then 5 mg daily for 5 days   No facility-administered encounter medications on file as of 01/09/2021.    Allergies (verified) Sulfa antibiotics, Vytorin [ezetimibe-simvastatin], Aubagio [teriflunomide], Cephalexin, Doxycycline, Erythromycin,  Macrobid [nitrofurantoin macrocrystal], Morphine, Rituxan [rituximab], Sulfonamide derivatives, and Amoxicillin   History: Past Medical History:  Diagnosis Date   Allergy    Arthritis, degenerative    both knees   B12 deficiency 07/06/2014   Cancer ([place])    Colitis    Common migraine with intractable migraine 07/12/2020   Diverticulosis    Duodenitis    Fatty liver    Gastritis    GERD (gastroesophageal reflux disease)    Heart murmur    Helicobacter pylori gastritis    Hyperlipidemia    Hypertension    not treated, pt reports "no"   IBD (inflammatory bowel disease)    Lymphoma ([place])    Memory difficulty 01/05/2013   Memory loss    mild   Multiple sclerosis ([place])    Osteopenia    Vitamin B12 deficiency    Past Surgical History:  Procedure Laterality Date   ABDOMINAL HYSTERECTOMY  2010   BLADDER SURGERY  2010   Bladder Tack   CERVICAL SPINE SURGERY     CHOLECYSTECTOMY     HEMORRHOID SURGERY     ROTATOR CUFF REPAIR     SPINAL FUSION     TUBAL LIGATION     Family History  Problem Relation Age of Onset   Diabetes Father    Heart disease Father    Colon polyps Mother    Heart failure Mother    Breast cancer Cousin    Colon cancer Maternal Grandmother        ? 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  Tobacco Use   Smoking status: Former    Types: Cigarettes    Quit date: 06/27/1997    Years since quitting: 23.5   Smokeless tobacco: Never  Vaping Use   Vaping Use: Never used   Substance and Sexual Activity   Alcohol use: No   Drug use: No   Sexual activity: Not Currently  Other Topics Concern   Not on file  Social History Narrative   Lives with husband   Left Handed   Drinks 1 cup caffeine daily   Social Determinants of Health   Financial Resource Strain: Low Risk    Difficulty of Paying Living Expenses: Not hard at all  Food Insecurity: No Food Insecurity   Worried About Charity fundraiser in the Last Year: Never true   Ran Out of Food in the Last Year: Never true  Transportation Needs: No Transportation Needs   Lack of Transportation (Medical): No   Lack of Transportation (  Non-Medical): No  Physical Activity: Sufficiently Active   Days of Exercise per Week: 5 days   Minutes of Exercise per Session: 40 min  Stress: No Stress Concern Present   Feeling of Stress : Not at all  Social Connections: Socially Integrated   Frequency of Communication with Friends and Family: More than three times a week   Frequency of Social Gatherings with Friends and Family: More than three times a week   Attends Religious Services: More than 4 times per year   Active Member of [employer] or Organizations: Yes   Attends Music therapist: More than 4 times per year   Marital Status: Married    Tobacco Counseling Counseling given: Not Answered   Clinical Intake:  Pre-visit preparation completed: Yes  Pain : No/denies pain     BMI - recorded: 22.63 Nutritional Status: BMI of 19-24  Normal Nutritional Risks: None Diabetes: No  How often do you need to have someone help you when you read instructions, pamphlets, or other written materials from your doctor or pharmacy?: 1 - Never  Diabetic?No  Interpreter Needed?: No  Information entered by :: Caroleen Hamman LPN   Activities of Daily Living In your present state of health, do you have any difficulty performing the following activities: 01/09/2021  Hearing? N  Vision? N  Difficulty  concentrating or making decisions? N  Walking or climbing stairs? N  Dressing or bathing? N  Doing errands, shopping? N  Preparing Food and eating ? N  Using the Toilet? N  In the past six months, have you accidently leaked urine? N  Do you have problems with loss of bowel control? Y  Comment occasionally  Managing your Medications? N  Managing your Finances? N  Housekeeping or managing your Housekeeping? N  Some recent data might be hidden    Patient Care Team: Copland, Gay Filler, MD as PCP - General (Family Medicine) Richardo Priest, MD as PCP - Cardiology (Cardiology) Rogers Blocker, MD as Referring Physician (Hematology and Oncology) Day, Melvenia Beam, [facility] (Inactive) (Pharmacist) Mauri Pole, MD as Consulting Physician (Gastroenterology) Kathrynn Ducking, MD as Consulting Physician (Neurology)  Indicate any recent Medical Services you may have received from other than [facility] providers in the past year (date may be approximate).     Assessment:   This is a routine wellness examination for [name].  Hearing/Vision screen Hearing Screening - Comments:: Mild hearing loss in right ear Vision Screening - Comments:: Wears glasses Last eye exam-07/2020-Dr. Katy Fitch  Dietary issues and exercise activities discussed: Current Exercise Habits: Home exercise routine, Type of exercise: walking, Time (Minutes): 40, Frequency (Times/Week): 5, Weekly Exercise (Minutes/Week): 200, Intensity: Mild, Exercise limited by: None identified   Goals Addressed             This Visit's Progress    Maintain healthy active lifestyle and independence   On track      Depression Screen PHQ 2/9 Scores 01/09/2021 06/28/2019 07/03/2017 11/03/2013  PHQ - 2 Score 0 0 0 0    Fall Risk Fall Risk  01/09/2021 07/11/2020 06/28/2019 07/03/2017 06/28/2015  Falls in the past year? 0 0 1 No No  Number falls in past yr: 0 0 0 - -  Injury with Fall? 0 0 0 - -  Follow up Falls prevention discussed - Education  provided;Falls prevention discussed - -    FALL RISK PREVENTION PERTAINING TO THE HOME:  Any stairs in or around the home? Yes  If so, are there  any without handrails? No  Home free of loose throw rugs in walkways, pet beds, electrical cords, etc? Yes  Adequate lighting in your home to reduce risk of falls? Yes   ASSISTIVE DEVICES UTILIZED TO PREVENT FALLS:  Life alert? No  Use of a cane, walker or w/c? No  Grab bars in the bathroom? Yes  Shower chair or bench in shower? No  Elevated toilet seat or a handicapped toilet? No   TIMED UP AND GO:  Was the test performed? No . Phone visit   Cognitive Function:Patient sees neurology for memory difficulties. MMSE - Mini Mental State Exam 01/31/2020 10/14/2018 01/02/2017 06/26/2016 12/28/2015  Orientation to time 5 5 5 5 4   Orientation to Place 5 5 5 5 5   Registration 3 3 3 3 3   Attention/ Calculation 1 2 4 5 5   Recall 3 2 3 3 3   Language- name 2 objects 2 1 2 2 2   Language- repeat 1 1 1 1 1   Language- follow 3 step command 3 3 3 3 3   Language- read & follow direction 1 1 1 1 1   Write a sentence 1 1 1 1 1   Write a sentence-comments - - - - -  Copy design 0 1 1 1 1   Total score 25 25 29 30 29         Immunizations Immunization History  Administered Date(s) Administered   Fluad Quad(high Dose 65+) 12/13/2018, 01/12/2020   Hep A / Hep B 06/20/2019, 07/20/2019, 12/22/2019   Influenza, High Dose Seasonal PF 01/26/2017, 01/18/2018   PFIZER Comirnaty(Gray Top)Covid-19 Tri-Sucrose Vaccine 09/26/2020   PFIZER(Purple Top)SARS-COV-2 Vaccination 04/29/2019, 05/20/2019, 12/06/2019   Pneumococcal Conjugate-13 01/12/2020   Pneumococcal Polysaccharide-23 07/05/2018   Td 12/13/2018    TDAP status: Up to date  Flu Vaccine status: Due, Education has been provided regarding the importance of this vaccine. Advised may receive this vaccine at local pharmacy or Health Dept. Aware to provide a copy of the vaccination record if obtained from local  pharmacy or Health Dept. Verbalized acceptance and understanding.  Pneumococcal vaccine status: Up to date  Covid-19 vaccine status: Completed vaccines  Qualifies for Shingles Vaccine? Yes   Zostavax completed No   Shingrix Completed?: No.    Education has been provided regarding the importance of this vaccine. Patient has been advised to call insurance company to determine out of pocket expense if they have not yet received this vaccine. Advised may also receive vaccine at local pharmacy or Health Dept. Verbalized acceptance and understanding.  Screening Tests Health Maintenance  Topic Date Due   Zoster Vaccines- Shingrix (1 of 2) Never done   INFLUENZA VACCINE  11/12/2020   COVID-19 Vaccine (5 - Booster for [place] series) 01/26/2021   MAMMOGRAM  06/18/2021   COLONOSCOPY (Pts 45-59yr Insurance coverage will need to be confirmed)  03/22/2026   TETANUS/TDAP  12/12/2028   DEXA SCAN  Completed   Hepatitis C Screening  Completed   HPV VACCINES  Aged Out    Health Maintenance  Health Maintenance Due  Topic Date Due   Zoster Vaccines- Shingrix (1 of 2) Never done   INFLUENZA VACCINE  11/12/2020    Colorectal cancer screening: Type of screening: Colonoscopy. Completed 03/23/2019. Repeat every 3 years  Mammogram status: Completed bilateral 06/18/2020. Repeat every year  Bone Density status: Ordered today. Pt provided with contact info and advised to call to schedule appt.  Lung Cancer Screening: (Low Dose CT Chest recommended if Age 79-80years, 30 pack-year currently smoking  OR have quit w/in 15years.) does not qualify.     Additional Screening:  Hepatitis C Screening: Completed 10/14/2018  Vision Screening: Recommended annual ophthalmology exams for early detection of glaucoma and other disorders of the eye. Is the patient up to date with their annual eye exam?  Yes  Who is the provider or what is the name of the office in which the patient attends annual eye exams? Dr.  Katy Fitch   Dental Screening: Recommended annual dental exams for proper oral hygiene  Community Resource Referral / Chronic Care Management: CRR required this visit?  No   CCM required this visit?  No      Plan:     I have personally reviewed and noted the following in the patient's chart:   Medical and social history Use of alcohol, tobacco or illicit drugs  Current medications and supplements including opioid prescriptions.  Functional ability and status Nutritional status Physical activity Advanced directives List of other physicians Hospitalizations, surgeries, and ER visits in previous 12 months Vitals Screenings to include cognitive, depression, and falls Referrals and appointments  In addition, I have reviewed and discussed with patient certain preventive protocols, quality metrics, and best practice recommendations. A written personalized care plan for preventive services as well as general preventive health recommendations were provided to patient.   Due to this being a telephonic visit, the after visit summary with patients personalized plan was offered to patient via mail or my-chart.  Patient would like to access on my-chart.   Marta Antu, LPN   [id number]  Nurse Health Advisor  Nurse Notes: None

## 2021-01-10 ENCOUNTER — Telehealth (HOSPITAL_BASED_OUTPATIENT_CLINIC_OR_DEPARTMENT_OTHER): Payer: Self-pay

## 2021-01-10 NOTE — Progress Notes (Addendum)
[place] at [facility] [address], [place], [place] [address] [phone]  Date:  01/14/2021   Name:  [name]   DOB:  [birth year]   MRN:  [id number]  PCP:  Vicki Mclean, MD    Chief Complaint: 6 month follow up (Concerns/ questions: 1. Pt says she has had a Eczema flare recently, has not had any issues since she was a child. 2. 2 or 3 times she has had some sharp discomfort in the chest. /Flu shot today: would like to see if it is cheaper at drug store. -inquires about shingrix and Pneumo/)   History of Present Illness:  [name] is a 80 y.o. very pleasant female patient who presents with the following:  Here today for a periodic follow-up visit Last seen by myself in July when she had a rash   History of multiple sclerosis, osteopenia, Crohn's disease and diverticulitis, dilated cardiomyopathy, IBD.  She also has lymphoma which is being followed by [name] at [facility] Her husband [name] is also my patient [name] neurology- recent stable MRI brain   Last visit with [name] office was about 6 weeks ago  IMPRESSION(S): [name] is a 80 y.o. 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 PET/CT after 3 cycles R-CHOP showed treatment response ([place] 3). On August 8, she completed 6 cycles of R-CHOP chemotherapy. End of treatment PET/CT scan performed on September 12 shows continued complete metabolic response. There were bilateral airspace opacities consistent with rituximab induced pneumonitis. Shortness of breath has improved with prednisone. In November 2019, she had diverticulitis which resolved. She also developed neutropenia but bone marrow examination on March 25, 2018 was unrevealing. By the end of December, her neutrophil count recovered. She has been in remission since.   PLAN(S): DLBCL involving kidneys as stage IIBE, dx 06/2017 : s/p  6 mini RCHOP and IT MTX, and IFRT radiation. Last PETCT 11/2018 showed CR.  Pt has new and persistent itchy skin rashes over past 4-6wsk; Since pt has DLBCL lymhoma hx and autoimmune conditions (MS and Crhon's), we will need to rule out if this is para-neoplastic signs for relapsing lymphoma. Her Labs cbc LDH look good today. ECOG 1  - I reviewed labs with normal counts and LDH Pt/husband; pending B-2.  - will check PETCT. Pt reminded of NO steroids for at least 2wks prior to PETCT scan; if negative for malignancy, would recommend allergy specialist for evaluation.  - she may try OTC zyrtec or benadryl, Pepcid AC, and topical calming creams.   Rituximab induced pneumonitis per CT in 12/2017: resolved.   Multiple nodules /goiter per thyroid US 2020 and PETCT 11/2018  - Pt to follow up with PCP, who has scheduled repeat neck US in fall 2022.   # Headaches/Migraine. With hx of MS. FU by her neuro.   G1 UEs Neuropathy with MS hx with Left foot drop hx. She is followed by Neurology and most recent MRI in 09/2019 was stable. She is not on any MS medications.   G1 diarrhea/ chronic baseline with Crohn's DZ. She has a gastroenterologist. She is not on any specific therapy for Crohn's and seems to be managing well.   Health maintenance / pt family counseling education / coordinator of care: - had covid shot x 4 per pt. LD date not recorded. Continue Covid precautions. Recommending Covid vaccine per guideline. Evushield  received 07/05/20-- may repeat in late 09/22. Colonoscopy per GI. Mammogram / dexa per local PCP recommendations.   12/25/20 RTC after PETCT to decide next step of care.    Flu vaccine- will do today  Covid bivalent-recommended, she will have this done at pharmacy Shingrix -recommended at pharmacy Most recent labs in July  Can update dexa-ordered today Mammo UTD  She has noted some possible eczema on her posterior hairline and her lower back-she had asthma as a child, but has not  bothered her in more recent years She is using desonide in her ears  She has fluocinonide ointment for her hairline and protopic which she is using on her chest and shoulders.  However, this does not really seem to be helping  She notes that she had a few episodes of anterior chest pains- occurred about 3 times over the last 4-6 weeks, lasted perhaps 3 seconds Non- exertional  She is doing some walking for exercise- she does about 3 days a week 15- 20 minutes, yoga 2-3 days a week   She did a Myoview in 2020 which was low risk Patient Active Problem List   Diagnosis Date Noted   Prediabetes 10/30/2020   Common migraine with intractable migraine 07/12/2020   Vitamin B12 deficiency    Memory loss    Lymphoma ([place])    IBD (inflammatory bowel disease)    Hypertension    Hyperlipidemia    Helicobacter pylori gastritis    Heart murmur    GERD (gastroesophageal reflux disease)    Gastritis    Fatty liver    Diverticulosis    Colitis    Cancer ([place])    Arthritis, degenerative    Allergy    New onset headache 09/26/2019   Dilated cardiomyopathy ([place]) 03/02/2019   Osteopenia 12/25/2018   Hypokalemia 02/28/2018   Sepsis ([place]) 02/27/2018   Diverticulitis 02/27/2018   Diffuse large B-cell lymphoma ([place]) 02/27/2018   Osteoarthritis of cervical spine 09/15/2016   Abnormality of gait 11/07/2015   Dizziness and giddiness 11/07/2015   Mild cognitive impairment 01/09/2015   B12 deficiency 07/06/2014   Leukopenia 05/06/2013   Memory difficulty 01/05/2013   Dyslipidemia 11/28/2008   Multiple sclerosis ([place]) 11/28/2008   INFLAMMATORY BOWEL DISEASE 11/28/2008   DIVERTICULOSIS, COLON 11/28/2008   FATTY LIVER DISEASE 11/28/2008   PERSONAL HISTORY OF PEPTIC ULCER DISEASE 11/28/2008    Past Medical History:  Diagnosis Date   Allergy    Arthritis, degenerative    both knees   B12 deficiency 07/06/2014   Cancer ([place])    Colitis    Common migraine with intractable migraine 07/12/2020    Diverticulosis    Duodenitis    Fatty liver    Gastritis    GERD (gastroesophageal reflux disease)    Heart murmur    Helicobacter pylori gastritis    Hyperlipidemia    Hypertension    not treated, pt reports "no"   IBD (inflammatory bowel disease)    Lymphoma ([place])    Memory difficulty 01/05/2013   Memory loss    mild   Multiple sclerosis ([place])    Osteopenia    Vitamin B12 deficiency     Past Surgical History:  Procedure Laterality Date   ABDOMINAL HYSTERECTOMY  2010   BLADDER SURGERY  2010   Bladder Tack   CERVICAL SPINE SURGERY     CHOLECYSTECTOMY     HEMORRHOID SURGERY     ROTATOR CUFF REPAIR     SPINAL FUSION  TUBAL LIGATION      Social History   Tobacco Use   Smoking status: Former    Types: Cigarettes    Quit date: 06/27/1997    Years since quitting: 23.5   Smokeless tobacco: Never  Vaping Use   Vaping Use: Never used  Substance Use Topics   Alcohol use: No   Drug use: No    Family History  Problem Relation Age of Onset   Diabetes Father    Heart disease Father    Colon polyps Mother    Heart failure Mother    Breast cancer Cousin    Colon cancer Maternal Grandmother        ? may be duodenal cancer   Colon polyps Brother    Hypertension Brother    Pancreatic cancer Maternal Uncle    Esophageal cancer Neg Hx    Rectal cancer Neg Hx    Stomach cancer Neg Hx     Allergies  Allergen Reactions   Sulfa Antibiotics Hives and Nausea And Vomiting   Vytorin [Ezetimibe-Simvastatin]     White skin patch   Aubagio [Teriflunomide]     diarrhea   Cephalexin    Doxycycline     Raw mouth    Erythromycin     REACTION: weakness   Macrobid [Nitrofurantoin Macrocrystal] Hives   Morphine Nausea And Vomiting   Rituxan [Rituximab]     Stated had issue with lungs after her last treatment   Sulfonamide Derivatives Hives and Nausea And Vomiting   Amoxicillin Hives    Medication list has been reviewed and updated.  Current Outpatient Medications on  File Prior to Visit  Medication Sig Dispense Refill   b complex vitamins tablet Take 1 tablet by mouth daily.     Cholecalciferol (VITAMIN D3) 5000 units TABS Take 5,000 Units by mouth daily.      desonide (DESOWEN) 0.05 % lotion APPLY TOPICALLY 2 (TWO) TIMES DAILY. USE AS NEEDED FOR ITCHY EARS 59 mL 1   fenofibrate (TRICOR) 145 MG tablet TAKE 1 TABLET BY MOUTH EVERY DAY 90 tablet 3   fluocinonide ointment (LIDEX) 7.65 % Apply 1 application topically 2 (two) times daily as needed.     gabapentin (NEURONTIN) 600 MG tablet Take 1 tablet (600 mg total) by mouth 3 (three) times daily. 270 tablet 3   Glucosamine 500 MG TABS Take 1 tablet by mouth 2 (two) times daily.     levocetirizine (XYZAL) 5 MG tablet Take 5 mg by mouth every evening.     loratadine (CLARITIN) 10 MG tablet Take 1 tablet (10 mg total) by mouth daily as needed for allergies.     LORazepam (ATIVAN) 0.5 MG tablet Take 1 tablet (0.5 mg total) by mouth every 8 (eight) hours. Use as needed for anxiety 30 tablet 0   memantine (NAMENDA) 10 MG tablet TAKE 1 TABLET BY MOUTH TWICE A DAY 180 tablet 3   Multiple Vitamin (MULTIVITAMIN) tablet Take 1 tablet by mouth daily.      Probiotic Product (ALIGN PO) Take 1 tablet by mouth daily.      rosuvastatin (CRESTOR) 20 MG tablet TAKE 1 TABLET BY MOUTH EVERY DAY 90 tablet 3   tacrolimus (PROTOPIC) 0.1 % ointment SMARTSIG:Sparingly Topical Twice Daily PRN     No current facility-administered medications on file prior to visit.    Review of Systems:  As per HPI- otherwise negative.   Physical Examination: Vitals:   01/14/21 0831  BP: 130/82  Pulse: 83  Resp: 18  Temp: 97.7 F (36.5 C)  SpO2: 97%   Vitals:   01/14/21 0831  Weight: 138 lb 3.2 oz (62.7 kg)  Height: 5' 5"  (1.651 m)   Body mass index is 23 kg/m. Ideal Body Weight: Weight in (lb) to have BMI = 25: 149.9  GEN: no acute distress.  Looks well, normal weight HEENT: Atraumatic, Normocephalic.  Ears and Nose: No external  deformity. CV: RRR, No M/G/R. No JVD. No thrill. No extra heart sounds. PULM: CTA B, no wheezes, crackles, rhonchi. No retractions. No resp. distress. No accessory muscle use. ABD: S, NT, ND, +BS. No rebound. No HSM. EXTR: No c/c/e PSYCH: Normally interactive. Conversant.  She has a patchy, discrete erythematous rash on the nape of her neck.  She also notes a dime sized poorly healing skin lesion on her right lateral ankle  Assessment and Plan: Pre-diabetes  Hyperlipidemia, unspecified hyperlipidemia type - Plan: Lipid panel  Multiple sclerosis ([place])  Osteopenia, unspecified location - Plan: DG Bone Density  Diffuse large B-cell lymphoma, unspecified body region ([place])  Vitamin D deficiency - Plan: VITAMIN D 25 Hydroxy (Vit-D Deficiency, Fractures)  B12 deficiency - Plan: Vitamin B12  Fatigue, unspecified type - Plan: TSH, VITAMIN D 25 Hydroxy (Vit-D Deficiency, Fractures), Vitamin B12  Need for influenza vaccination - Plan: Flu Vaccine QUAD High Dose(Fluad)  Following up today.  Labs pending as above, flu shot given Discussed Shingrix and COVID-19 booster I asked her to see her dermatologist regarding skin findings as above.  She plans to do so She had approximately 3, momentary nonexertional chest pains over the last 6 weeks or so.  This would be very atypical for cardiac pain, for the time being we plan to observe but she will let me or her cardiologist know if this is escalating or becoming associated with exercise  Signed Lamar Blinks, MD  Received her labs as below, message to patient  Results for orders placed or performed in visit on 01/14/21  Lipid panel  Result Value Ref Range   Cholesterol 99 0 - 200 mg/dL   Triglycerides 289.0 (H) 0.0 - 149.0 mg/dL   HDL 33.50 (L) >39.00 mg/dL   VLDL 57.8 (H) 0.0 - 40.0 mg/dL   Total CHOL/HDL Ratio 3    NonHDL 65.46   TSH  Result Value Ref Range   TSH 1.50 0.35 - 5.50 uIU/mL  VITAMIN D 25 Hydroxy (Vit-D Deficiency,  Fractures)  Result Value Ref Range   VITD 61.59 30.00 - 100.00 ng/mL  Vitamin B12  Result Value Ref Range   Vitamin B-12 559 211 - 911 pg/mL  LDL cholesterol, direct  Result Value Ref Range   Direct LDL 37.0 mg/dL

## 2021-01-10 NOTE — Patient Instructions (Addendum)
Good to see you today- please see me in about 6 months assuming all is well  I will be in touch with your labs asap Flu vaccine today  Please call your derm and have them check out your rash on neck, and the area on your right ankle  Ok to have bivalent covid booster at your convenience, as well as the shingles vaccine (shingrix) I ordered a bone density scan for you; please do at your earliest convenience

## 2021-01-14 ENCOUNTER — Ambulatory Visit (INDEPENDENT_AMBULATORY_CARE_PROVIDER_SITE_OTHER): Payer: Medicare HMO | Admitting: Family Medicine

## 2021-01-14 ENCOUNTER — Other Ambulatory Visit: Payer: Self-pay

## 2021-01-14 ENCOUNTER — Encounter: Payer: Self-pay | Admitting: Family Medicine

## 2021-01-14 VITALS — BP 130/82 | HR 83 | Temp 97.7°F | Resp 18 | Ht 65.0 in | Wt 138.2 lb

## 2021-01-14 DIAGNOSIS — R5383 Other fatigue: Secondary | ICD-10-CM

## 2021-01-14 DIAGNOSIS — C8330 Diffuse large B-cell lymphoma, unspecified site: Secondary | ICD-10-CM | POA: Diagnosis not present

## 2021-01-14 DIAGNOSIS — R7303 Prediabetes: Secondary | ICD-10-CM | POA: Diagnosis not present

## 2021-01-14 DIAGNOSIS — G35 Multiple sclerosis: Secondary | ICD-10-CM

## 2021-01-14 DIAGNOSIS — M8580 Other specified disorders of bone density and structure, unspecified site: Secondary | ICD-10-CM | POA: Diagnosis not present

## 2021-01-14 DIAGNOSIS — E538 Deficiency of other specified B group vitamins: Secondary | ICD-10-CM | POA: Diagnosis not present

## 2021-01-14 DIAGNOSIS — Z23 Encounter for immunization: Secondary | ICD-10-CM

## 2021-01-14 DIAGNOSIS — E559 Vitamin D deficiency, unspecified: Secondary | ICD-10-CM

## 2021-01-14 DIAGNOSIS — E785 Hyperlipidemia, unspecified: Secondary | ICD-10-CM

## 2021-01-14 LAB — LIPID PANEL
Cholesterol: 99 mg/dL (ref 0–200)
HDL: 33.5 mg/dL — ABNORMAL LOW (ref >39.00)
NonHDL: 65.46
Total CHOL/HDL Ratio: 3
Triglycerides: 289 mg/dL — ABNORMAL HIGH (ref 0.0–149.0)
VLDL: 57.8 mg/dL — ABNORMAL HIGH (ref 0.0–40.0)

## 2021-01-14 LAB — TSH: TSH: 1.5 u[IU]/mL (ref 0.35–5.50)

## 2021-01-14 LAB — VITAMIN B12: Vitamin B-12: 559 pg/mL (ref 211–911)

## 2021-01-14 LAB — LDL CHOLESTEROL, DIRECT: Direct LDL: 37 mg/dL

## 2021-01-14 LAB — VITAMIN D 25 HYDROXY (VIT D DEFICIENCY, FRACTURES): VITD: 61.59 ng/mL (ref 30.00–100.00)

## 2021-01-17 DIAGNOSIS — G629 Polyneuropathy, unspecified: Secondary | ICD-10-CM | POA: Diagnosis not present

## 2021-01-17 DIAGNOSIS — R918 Other nonspecific abnormal finding of lung field: Secondary | ICD-10-CM | POA: Diagnosis not present

## 2021-01-17 DIAGNOSIS — K5792 Diverticulitis of intestine, part unspecified, without perforation or abscess without bleeding: Secondary | ICD-10-CM | POA: Diagnosis not present

## 2021-01-17 DIAGNOSIS — M21371 Foot drop, right foot: Secondary | ICD-10-CM | POA: Diagnosis not present

## 2021-01-17 DIAGNOSIS — I351 Nonrheumatic aortic (valve) insufficiency: Secondary | ICD-10-CM | POA: Diagnosis not present

## 2021-01-17 DIAGNOSIS — R6881 Early satiety: Secondary | ICD-10-CM | POA: Diagnosis not present

## 2021-01-17 DIAGNOSIS — Z87891 Personal history of nicotine dependence: Secondary | ICD-10-CM | POA: Diagnosis not present

## 2021-01-17 DIAGNOSIS — I358 Other nonrheumatic aortic valve disorders: Secondary | ICD-10-CM | POA: Diagnosis not present

## 2021-01-17 DIAGNOSIS — R630 Anorexia: Secondary | ICD-10-CM | POA: Diagnosis not present

## 2021-01-17 DIAGNOSIS — G43909 Migraine, unspecified, not intractable, without status migrainosus: Secondary | ICD-10-CM | POA: Diagnosis not present

## 2021-01-17 DIAGNOSIS — E042 Nontoxic multinodular goiter: Secondary | ICD-10-CM | POA: Diagnosis not present

## 2021-01-17 DIAGNOSIS — C8339 Diffuse large B-cell lymphoma, extranodal and solid organ sites: Secondary | ICD-10-CM | POA: Diagnosis not present

## 2021-01-17 DIAGNOSIS — K5900 Constipation, unspecified: Secondary | ICD-10-CM | POA: Diagnosis not present

## 2021-01-17 DIAGNOSIS — Z23 Encounter for immunization: Secondary | ICD-10-CM | POA: Diagnosis not present

## 2021-01-17 DIAGNOSIS — R0602 Shortness of breath: Secondary | ICD-10-CM | POA: Diagnosis not present

## 2021-01-17 DIAGNOSIS — M21372 Foot drop, left foot: Secondary | ICD-10-CM | POA: Diagnosis not present

## 2021-01-17 DIAGNOSIS — K5090 Crohn's disease, unspecified, without complications: Secondary | ICD-10-CM | POA: Diagnosis not present

## 2021-01-17 DIAGNOSIS — R059 Cough, unspecified: Secondary | ICD-10-CM | POA: Diagnosis not present

## 2021-01-21 ENCOUNTER — Ambulatory Visit (HOSPITAL_BASED_OUTPATIENT_CLINIC_OR_DEPARTMENT_OTHER)
Admission: RE | Admit: 2021-01-21 | Discharge: 2021-01-21 | Disposition: A | Discharge status: Home / Self Care | Payer: Medicare HMO | Source: Ambulatory Visit | Attending: Family Medicine | Admitting: Family Medicine

## 2021-01-21 ENCOUNTER — Other Ambulatory Visit: Payer: Self-pay

## 2021-01-21 DIAGNOSIS — M8589 Other specified disorders of bone density and structure, multiple sites: Secondary | ICD-10-CM | POA: Diagnosis not present

## 2021-01-21 DIAGNOSIS — Z780 Asymptomatic menopausal state: Secondary | ICD-10-CM | POA: Diagnosis not present

## 2021-01-22 ENCOUNTER — Encounter: Payer: Self-pay | Admitting: Family Medicine

## 2021-01-23 ENCOUNTER — Encounter: Payer: Self-pay | Admitting: Neurology

## 2021-01-23 ENCOUNTER — Ambulatory Visit: Payer: Medicare HMO | Admitting: Neurology

## 2021-01-23 VITALS — BP 167/97 | HR 87 | Ht 65.5 in | Wt 138.0 lb

## 2021-01-23 DIAGNOSIS — R413 Other amnesia: Secondary | ICD-10-CM | POA: Diagnosis not present

## 2021-01-23 DIAGNOSIS — G35 Multiple sclerosis: Secondary | ICD-10-CM

## 2021-01-23 DIAGNOSIS — G43019 Migraine without aura, intractable, without status migrainosus: Secondary | ICD-10-CM | POA: Diagnosis not present

## 2021-01-23 DIAGNOSIS — R269 Unspecified abnormalities of gait and mobility: Secondary | ICD-10-CM | POA: Diagnosis not present

## 2021-01-23 NOTE — Progress Notes (Signed)
Reason for visit: Multiple sclerosis  [name] is an 80 y.o. female  History of present illness:  [name] is a 80 year old left-handed white female with history of multiple sclerosis.  She currently is off of disease modifying agents.  She has a history of lymphoma, she has been on rituximab in the past.  She has a history of eczema and is considering going on Ofatunumab for this.  The patient has not noted any new symptoms.  She at times may feel somewhat tight or stiff with the left leg.  She denies any falls.  She has not had any troubles controlling the bowels or the bladder with exception of some occasional fecal incontinence.  She reports no change in her memory, she reports that her headaches have improved, she takes Excedrin Migraine once every 3 weeks or so for her headache.  Overall, she believes that she has done well.  Past Medical History:  Diagnosis Date   Allergy    Arthritis, degenerative    both knees   B12 deficiency 07/06/2014   Cancer ([place])    Colitis    Common migraine with intractable migraine 07/12/2020   Diverticulosis    Duodenitis    Eczema    Fatty liver    Gastritis    GERD (gastroesophageal reflux disease)    Heart murmur    Helicobacter pylori gastritis    Hyperlipidemia    Hypertension    not treated, pt reports "no"   IBD (inflammatory bowel disease)    Lymphoma ([place])    Memory difficulty 01/05/2013   Memory loss    mild   Multiple sclerosis ([place])    Osteopenia    Vitamin B12 deficiency     Past Surgical History:  Procedure Laterality Date   ABDOMINAL HYSTERECTOMY  2010   BLADDER SURGERY  2010   Bladder Tack   CERVICAL SPINE SURGERY     CHOLECYSTECTOMY     HEMORRHOID SURGERY     ROTATOR CUFF REPAIR     SPINAL FUSION     TUBAL LIGATION      Family History  Problem Relation Age of Onset   Colon polyps Mother    Heart failure Mother    Diabetes Father    Heart disease Father    Colon polyps Brother    Hypertension  Brother    Colon cancer Maternal Grandmother        ? may be duodenal cancer   Pancreatic cancer Maternal Uncle    Breast cancer Cousin    Esophageal cancer Neg Hx    Rectal cancer Neg Hx    Stomach cancer Neg Hx     Social history:  reports that she quit smoking about 23 years ago. Her smoking use included cigarettes. She has never used smokeless tobacco. She reports that she does not drink alcohol and does not use drugs.    Allergies  Allergen Reactions   Sulfa Antibiotics Hives and Nausea And Vomiting   Vytorin [Ezetimibe-Simvastatin]     White skin patch   Aubagio [Teriflunomide]     diarrhea   Cephalexin    Doxycycline     Raw mouth    Erythromycin     REACTION: weakness   Macrobid [Nitrofurantoin Macrocrystal] Hives   Morphine Nausea And Vomiting   Rituxan [Rituximab]     Stated had issue with lungs after her last treatment   Sulfonamide Derivatives Hives and Nausea And Vomiting   Amoxicillin Hives  Medications:  Prior to Admission medications   Medication Sig Start Date End Date Taking? Authorizing Provider  b complex vitamins tablet Take 1 tablet by mouth daily.   Yes [provider]  Cholecalciferol (VITAMIN D3) 5000 units TABS Take 5,000 Units by mouth daily.    Yes [provider]  desonide (DESOWEN) 0.05 % lotion APPLY TOPICALLY 2 (TWO) TIMES DAILY. USE AS NEEDED FOR ITCHY EARS 12/11/20  Yes Copland, Gay Filler, MD  fenofibrate (TRICOR) 145 MG tablet TAKE 1 TABLET BY MOUTH EVERY DAY 12/28/20  Yes Copland, Gay Filler, MD  fluocinonide ointment (LIDEX) 8.29 % Apply 1 application topically 2 (two) times daily as needed. 12/26/20  Yes [provider]  gabapentin (NEURONTIN) 600 MG tablet Take 1 tablet (600 mg total) by mouth 3 (three) times daily. 01/31/20  Yes Suzzanne Cloud, NP  Glucosamine 500 MG TABS Take 1 tablet by mouth 2 (two) times daily.   Yes [provider]  levocetirizine (XYZAL) 5 MG tablet Take 5 mg by mouth every  evening.   Yes [provider]  loratadine (CLARITIN) 10 MG tablet Take 1 tablet (10 mg total) by mouth daily as needed for allergies. 02/28/18  Yes Rama, Venetia Maxon, MD  memantine (NAMENDA) 10 MG tablet TAKE 1 TABLET BY MOUTH TWICE A DAY 08/14/20  Yes Kathrynn Ducking, MD  Multiple Vitamin (MULTIVITAMIN) tablet Take 1 tablet by mouth daily.    Yes [provider]  Probiotic Product (ALIGN PO) Take 1 tablet by mouth daily.    Yes [provider]  rosuvastatin (CRESTOR) 20 MG tablet TAKE 1 TABLET BY MOUTH EVERY DAY 12/11/20  Yes Copland, Gay Filler, MD  LORazepam (ATIVAN) 0.5 MG tablet Take 1 tablet (0.5 mg total) by mouth every 8 (eight) hours. Use as needed for anxiety Patient not taking: Reported on 01/23/2021 01/12/20   Copland, Gay Filler, MD  tacrolimus (PROTOPIC) 0.1 % ointment SMARTSIG:Sparingly Topical Twice Daily PRN Patient not taking: Reported on 01/23/2021 12/10/20   [provider]  triamcinolone ointment (KENALOG) 0.1 % SMARTSIG:Sparingly Topical Twice Daily 12/10/20   [provider]    ROS:  Out of a complete 14 system review of symptoms, the patient complains only of the following symptoms, and all other reviewed systems are negative.  Fecal incontinence Headache Mild memory problems  Blood pressure (!) 167/97, pulse 87, height 5' 5.5" (1.664 m), weight 138 lb (62.6 kg).  Physical Exam  General: The patient is alert and cooperative at the time of the examination.  Skin: No significant peripheral edema is noted.   Neurologic Exam  Mental status: The patient is alert and oriented x 3 at the time of the examination. The patient has apparent normal recent and remote memory, with an apparently normal attention span and concentration ability.   Cranial nerves: Facial symmetry is present. Speech is normal, no aphasia or dysarthria is noted. Extraocular movements are full. Visual fields are full.  Motor: The patient has good  strength in all 4 extremities.  Sensory examination: Soft touch sensation is symmetric on the face, arms, and legs.  Coordination: The patient has good finger-nose-finger and heel-to-shin bilaterally.  Gait and station: The patient has a normal gait. Tandem gait is slightly unsteady. Romberg is negative. No drift is seen.  Reflexes: Deep tendon reflexes are symmetric.   MRI brain 11/02/20:   IMPRESSION: This MRI of the brain with and without contrast shows the following: 1.   Multiple T2/FLAIR hyperintense foci in the  hemispheres and a couple foci in the cerebellum.  These are nonspecific and the majority of the foci have an appearance most consistent with chronic microvascular ischemic change.  Overlap with demyelination is possible.  None of the foci appear to be acute.  They do not enhance.  Compared to the MRI from 2019 2021, there are no new lesions. 2.   Mild stable generalized cortical atrophy.   3.   No acute findings.  Normal enhancement pattern.  * MRI scan images were reviewed online. I agree with the written report.    Assessment/Plan:  1.  History of multiple sclerosis  2.  History of migraine headache  3.  Mild cognitive impairment  The patient is doing relatively well at this point.  We will continue to follow patient on a conservative basis.  She will follow-up in about 6 months and she will be seen through Dr. Felecia Shelling in the future.  Jill Alexanders MD 01/23/2021 8:55 AM  [facility] [address] [place], [place] [address]  Phone [phone] Fax [phone]

## 2021-02-13 DIAGNOSIS — C8339 Diffuse large B-cell lymphoma, extranodal and solid organ sites: Secondary | ICD-10-CM | POA: Diagnosis not present

## 2021-02-19 ENCOUNTER — Other Ambulatory Visit: Payer: Self-pay | Admitting: Neurology

## 2021-02-19 ENCOUNTER — Other Ambulatory Visit: Payer: Self-pay | Admitting: Family Medicine

## 2021-02-19 DIAGNOSIS — E785 Hyperlipidemia, unspecified: Secondary | ICD-10-CM

## 2021-02-22 DIAGNOSIS — L308 Other specified dermatitis: Secondary | ICD-10-CM | POA: Diagnosis not present

## 2021-02-22 DIAGNOSIS — L218 Other seborrheic dermatitis: Secondary | ICD-10-CM | POA: Diagnosis not present

## 2021-03-05 DIAGNOSIS — L309 Dermatitis, unspecified: Secondary | ICD-10-CM | POA: Diagnosis not present

## 2021-03-05 DIAGNOSIS — E785 Hyperlipidemia, unspecified: Secondary | ICD-10-CM | POA: Diagnosis not present

## 2021-03-05 DIAGNOSIS — Z881 Allergy status to other antibiotic agents status: Secondary | ICD-10-CM | POA: Diagnosis not present

## 2021-03-05 DIAGNOSIS — G35 Multiple sclerosis: Secondary | ICD-10-CM | POA: Diagnosis not present

## 2021-03-05 DIAGNOSIS — Z880 Allergy status to penicillin: Secondary | ICD-10-CM | POA: Diagnosis not present

## 2021-03-05 DIAGNOSIS — G629 Polyneuropathy, unspecified: Secondary | ICD-10-CM | POA: Diagnosis not present

## 2021-03-05 DIAGNOSIS — C8590 Non-Hodgkin lymphoma, unspecified, unspecified site: Secondary | ICD-10-CM | POA: Diagnosis not present

## 2021-03-05 DIAGNOSIS — Z87891 Personal history of nicotine dependence: Secondary | ICD-10-CM | POA: Diagnosis not present

## 2021-03-05 DIAGNOSIS — R030 Elevated blood-pressure reading, without diagnosis of hypertension: Secondary | ICD-10-CM | POA: Diagnosis not present

## 2021-03-05 DIAGNOSIS — J309 Allergic rhinitis, unspecified: Secondary | ICD-10-CM | POA: Diagnosis not present

## 2021-03-05 DIAGNOSIS — G8929 Other chronic pain: Secondary | ICD-10-CM | POA: Diagnosis not present

## 2021-03-05 DIAGNOSIS — R32 Unspecified urinary incontinence: Secondary | ICD-10-CM | POA: Diagnosis not present

## 2021-03-13 DIAGNOSIS — L309 Dermatitis, unspecified: Secondary | ICD-10-CM | POA: Insufficient documentation

## 2021-03-18 NOTE — Progress Notes (Signed)
Cardiology Office Note:    Date:  03/19/2021   ID:  [name], DOB [birth year], MRN [id number]  PCP:  Darreld Mclean, MD  Cardiologist:  Shirlee More, MD    Referring MD: Darreld Mclean, MD    ASSESSMENT:    1. Dilated cardiomyopathy ([place])   2. Coronary artery calcification   3. Mixed hyperlipidemia   4. RBBB (right bundle branch block with left anterior fascicular block)    PLAN:    In order of problems listed above:  Stable cardiomyopathy EF has not changed I think she has stage II hypertension I1 paced on ARB avoid rate slowing medications of bifascicular heart block but she wants to check her blood pressures for 1 week and I did give her a prescription for valsartan.  I do not think we need to start G MDT with stable ejection fraction over time Stable no anginal discomfort she has had a previous normal myocardial perfusion study Continue her statin recent lipids 01/14/2021 cholesterol 99 LDL 42 triglycerides 289 HDL 33.5 Able EKG pattern avoid rate slowing medications if syncope occurred she need EP evaluation and likely pacemaker   Next appointment: 3 months to follow-up on blood pressure   Medication Adjustments/Labs and Tests Ordered: Current medicines are reviewed at length with the patient today.  Concerns regarding medicines are outlined above.  Orders Placed This Encounter  Procedures   EKG 12-Lead    Meds ordered this encounter  Medications   valsartan (DIOVAN) 80 MG tablet    Sig: Take 1 tablet (80 mg total) by mouth daily.    Dispense:  90 tablet    Refill:  3     Chief Complaint  Patient presents with   Follow-up   Cardiomyopathy   .[facility]  History of Present Illness:    [name] Holder is a 80 y.o. female with a hx of cardiomyopathy mildly reduced ejection fraction 45 to 50% by echocardiogram 46% by myocardial perfusion stable at most recent echocardiogram 12/23/2019 secondary to chemotherapy for non-Hodgkin's lymphoma.  Other  problems include Crohn's disease hyperlipidemia memory loss and coronary artery calcification.  She was last seen 05/16/2020.  Compliance with diet, lifestyle and medications: Yes  She was unaware of her blood pressure is significantly elevated when seen by neurology 01/23/2021 at 167/97.  She is not trending her blood pressures at home. She is having no cardiovascular symptoms of edema shortness of breath orthopnea chest pain palpitation or syncope.  She had a repeat echocardiogram 12/14/2020 showing ejection fraction low normal 50 to 55% with moderate concentric LVH the right ventricle is normal in size and function and there is mild aortic regurgitation present. Past Medical History:  Diagnosis Date   Allergy    Arthritis, degenerative    both knees   B12 deficiency 07/06/2014   Cancer ([place])    Colitis    Common migraine with intractable migraine 07/12/2020   Diverticulosis    Duodenitis    Eczema    Fatty liver    Gastritis    GERD (gastroesophageal reflux disease)    Heart murmur    Helicobacter pylori gastritis    Hyperlipidemia    Hypertension    not treated, pt reports "no"   IBD (inflammatory bowel disease)    Lymphoma ([place])    Memory difficulty 01/05/2013   Memory loss    mild   Multiple sclerosis ([place])    Osteopenia    Vitamin B12 deficiency     Past Surgical History:  Procedure Laterality Date   ABDOMINAL HYSTERECTOMY  2010   BLADDER SURGERY  2010   Bladder Tack   CERVICAL SPINE SURGERY     CHOLECYSTECTOMY     HEMORRHOID SURGERY     ROTATOR CUFF REPAIR     SPINAL FUSION     TUBAL LIGATION      Current Medications: Current Meds  Medication Sig   b complex vitamins tablet Take 1 tablet by mouth daily.   Cholecalciferol (VITAMIN D3) 5000 units TABS Take 5,000 Units by mouth daily.    desonide (DESOWEN) 0.05 % lotion APPLY TOPICALLY 2 (TWO) TIMES DAILY. USE AS NEEDED FOR ITCHY EARS   fenofibrate (TRICOR) 145 MG tablet TAKE 1 TABLET BY MOUTH EVERY DAY    gabapentin (NEURONTIN) 600 MG tablet TAKE 1 TABLET BY MOUTH THREE TIMES A DAY   Glucosamine 500 MG TABS Take 1 tablet by mouth 2 (two) times daily.   levocetirizine (XYZAL) 5 MG tablet Take 5 mg by mouth every evening.   loratadine (CLARITIN) 10 MG tablet Take 1 tablet (10 mg total) by mouth daily as needed for allergies.   memantine (NAMENDA) 10 MG tablet TAKE 1 TABLET BY MOUTH TWICE A DAY   Multiple Vitamin (MULTIVITAMIN) tablet Take 1 tablet by mouth daily.    Probiotic Product (ALIGN PO) Take 1 tablet by mouth daily.    rosuvastatin (CRESTOR) 20 MG tablet TAKE 1 TABLET BY MOUTH EVERY DAY   triamcinolone ointment (KENALOG) 0.1 % SMARTSIG:Sparingly Topical Twice Daily   valsartan (DIOVAN) 80 MG tablet Take 1 tablet (80 mg total) by mouth daily.     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  Tobacco Use   Smoking status: Former    Types: Cigarettes    Quit date: 06/27/1997    Years since quitting: 23.7   Smokeless tobacco: Never  Vaping Use   Vaping Use: Never used  Substance and Sexual Activity   Alcohol use: No   Drug use: No   Sexual activity: Not Currently  Other Topics Concern   Not on file  Social History Narrative   Lives with husband   Left Handed   Drinks 1 cup caffeine daily   Social Determinants of Health   Financial Resource Strain: Low Risk    Difficulty of Paying Living Expenses: Not hard at all  Food Insecurity: No Food  Insecurity   Worried About Charity fundraiser in the Last Year: Never true   Ran Out of Food in the Last Year: Never true  Transportation Needs: No Transportation Needs   Lack of Transportation (Medical): No   Lack of Transportation (Non-Medical): No  Physical Activity: Sufficiently Active   Days of Exercise per Week: 5 days   Minutes of Exercise per Session: 40 min  Stress: No Stress Concern Present   Feeling of Stress : Not at all  Social Connections: Socially Integrated   Frequency of Communication with Friends and Family: More than three times a week   Frequency of Social  Gatherings with Friends and Family: More than three times a week   Attends Religious Services: More than 4 times per year   Active Member of Clubs or Organizations: Yes   Attends Music therapist: More than 4 times per year   Marital Status: Married     Family History: The patient's family history includes Breast cancer in her cousin; Colon cancer in her maternal grandmother; Colon polyps in her brother and mother; Diabetes in her father; Heart disease in her father; Heart failure in her mother; Hypertension in her brother; Pancreatic cancer in her maternal uncle. There is no history of Esophageal cancer, Rectal cancer, or Stomach cancer. ROS:   Please see the history of present illness.    All other systems reviewed and are negative.  EKGs/Labs/Other Studies Reviewed:    The following studies were reviewed today:  EKG:  EKG ordered today and personally reviewed.  The ekg ordered today demonstrates sinus rhythm left axis deviation right bundle branch block left anterior hemiblock first-degree AV block  Recent Labs: 05/16/2020: NT-Pro BNP 297 10/29/2020: ALT 16; BUN 22; Creatinine, Ser 1.14; Hemoglobin 13.6; Platelets 220.0; Potassium 4.5; Sodium 137 01/14/2021: TSH 1.50  Recent Lipid Panel    Component Value Date/Time   CHOL 99 01/14/2021 0907   TRIG 289.0 (H) 01/14/2021 0907   HDL 33.50 (L)  01/14/2021 0907   CHOLHDL 3 01/14/2021 0907   VLDL 57.8 (H) 01/14/2021 0907   LDLCALC 42 01/24/2020 1032   LDLDIRECT 37.0 01/14/2021 0907    Physical Exam:    VS:  BP (!) 162/90   Pulse 85   Ht 5' 5.5" (1.664 m)   Wt 137 lb 1.9 oz (62.2 kg)   SpO2 99%   BMI 22.47 kg/m     Wt Readings from Last 3 Encounters:  03/19/21 137 lb 1.9 oz (62.2 kg)  01/23/21 138 lb (62.6 kg)  01/14/21 138 lb 3.2 oz (62.7 kg)     GEN:  Well nourished, well developed in no acute distress HEENT: Normal NECK: No JVD; No carotid bruits LYMPHATICS: No lymphadenopathy CARDIAC: RRR, no murmurs, rubs, gallops RESPIRATORY:  Clear to auscultation without rales, wheezing or rhonchi  ABDOMEN: Soft, non-tender, non-distended MUSCULOSKELETAL:  No edema; No deformity  SKIN: Warm and dry NEUROLOGIC:  Alert and oriented x 3 PSYCHIATRIC:  Normal affect    Signed, Shirlee More, MD  03/19/2021 11:06 AM    [place]

## 2021-03-19 ENCOUNTER — Encounter: Payer: Self-pay | Admitting: Cardiology

## 2021-03-19 ENCOUNTER — Other Ambulatory Visit: Payer: Self-pay

## 2021-03-19 ENCOUNTER — Ambulatory Visit: Payer: Medicare HMO | Admitting: Cardiology

## 2021-03-19 VITALS — BP 162/90 | HR 85 | Ht 65.5 in | Wt 137.1 lb

## 2021-03-19 DIAGNOSIS — I2584 Coronary atherosclerosis due to calcified coronary lesion: Secondary | ICD-10-CM | POA: Diagnosis not present

## 2021-03-19 DIAGNOSIS — E782 Mixed hyperlipidemia: Secondary | ICD-10-CM

## 2021-03-19 DIAGNOSIS — I420 Dilated cardiomyopathy: Secondary | ICD-10-CM | POA: Diagnosis not present

## 2021-03-19 DIAGNOSIS — I452 Bifascicular block: Secondary | ICD-10-CM | POA: Diagnosis not present

## 2021-03-19 DIAGNOSIS — I2510 Atherosclerotic heart disease of native coronary artery without angina pectoris: Secondary | ICD-10-CM

## 2021-03-19 MED ORDER — VALSARTAN 80 MG PO TABS: 80.0000 mg | ORAL_TABLET | Freq: Every day | ORAL | 3 refills | Status: DC

## 2021-03-19 NOTE — Patient Instructions (Addendum)
Medication Instructions:  Your physician has recommended you make the following change in your medication:  START: Valsartan 80 mg take one tablet by mouth daily. *If you need a refill on your cardiac medications before your next appointment, please call your pharmacy*   Lab Work: None If you have labs (blood work) drawn today and your tests are completely normal, you will receive your results only by: [place] (if you have MyChart) OR A paper copy in the mail If you have any lab test that is abnormal or we need to change your treatment, we will call you to review the results.   Testing/Procedures: None   Follow-Up: At [facility], you and your health needs are our priority.  As part of our continuing mission to provide you with exceptional heart care, we have created designated Provider Care Teams.  These Care Teams include your primary Cardiologist (physician) and Advanced Practice Providers (APPs -  Physician Assistants and Nurse Practitioners) who all work together to provide you with the care you need, when you need it.  We recommend signing up for the patient portal called "MyChart".  Sign up information is provided on this After Visit Summary.  MyChart is used to connect with patients for Virtual Visits (Telemedicine).  Patients are able to view lab/test results, encounter notes, upcoming appointments, etc.  Non-urgent messages can be sent to your provider as well.   To learn more about what you can do with MyChart, go to [email or web address].    Your next appointment:   3 month(s)  The format for your next appointment:   In Person  Provider:   Shirlee More, MD    Other Instructions Please check your blood pressure daily and send [place] a list of these readings via MyChart in one week.

## 2021-04-23 DIAGNOSIS — L300 Nummular dermatitis: Secondary | ICD-10-CM | POA: Diagnosis not present

## 2021-04-23 DIAGNOSIS — L720 Epidermal cyst: Secondary | ICD-10-CM | POA: Diagnosis not present

## 2021-04-26 NOTE — Patient Instructions (Addendum)
It was good to see you again today! I will be in touch with your labs Assuming labs are ok we can start you on terbinafine 250 daily for 12 weeks to clear up your nails

## 2021-04-26 NOTE — Progress Notes (Addendum)
[place] at [facility] [address] [place], [place] [address] [phone]  Date:  04/29/2021   Name:  [name]   DOB:  12-27-1940   MRN:  [id number]  PCP:  Darreld Mclean, MD    Chief Complaint: Nail Problem (Pt c/o: toe nail fungus for a few months now. Home tx have not been working. Pharmacist suggested an oral tx. /Concerns/ questions: would like for you to look at her scalp)   History of Present Illness:  [name] is a 81 y.o. very pleasant female patient who presents with the following:  Patient seen today with concern of possible toenail fungus Most recent visit with myself October of last year History of multiple sclerosis, osteopenia, Crohn's disease and diverticulitis, dilated cardiomyopathy, IBD.  She also has lymphoma which is being followed by Dr. Talbert Cage at [facility]  Most recent metabolic profile on chart from July  In the interim she saw her neurologist, Dr. Jannifer Franklin Her oncologist, Dr. Mina Marble Maylon Peppers also)  And her cardiologist, Dr. Bettina Gavia She also received a dose of Evusheld for COVID-19 prevention from [facility] oncology in November Patient Active Problem List   Diagnosis Date Noted   Eczema 03/13/2021   Prediabetes 10/30/2020   Common migraine with intractable migraine 07/12/2020   Vitamin B12 deficiency    Memory loss    Lymphoma ([place])    IBD (inflammatory bowel disease)    Hypertension    Hyperlipidemia    Helicobacter pylori gastritis    Heart murmur    GERD (gastroesophageal reflux disease)    Gastritis    Fatty liver    Diverticulosis    Colitis    Cancer ([place])    Arthritis, degenerative    Allergy    New onset headache 09/26/2019   Dilated cardiomyopathy ([place]) 03/02/2019   Osteopenia 12/25/2018   Hypokalemia 02/28/2018   Sepsis ([place]) 02/27/2018   Diverticulitis 02/27/2018   Diffuse large B-cell lymphoma ([place]) 02/27/2018   Osteoarthritis of cervical spine 09/15/2016   Abnormality of  gait 11/07/2015   Dizziness and giddiness 11/07/2015   Mild cognitive impairment 01/09/2015   B12 deficiency 07/06/2014   Leukopenia 05/06/2013   Memory difficulty 01/05/2013   Dyslipidemia 11/28/2008   Multiple sclerosis ([place]) 11/28/2008   INFLAMMATORY BOWEL DISEASE 11/28/2008   DIVERTICULOSIS, COLON 11/28/2008   PERSONAL HISTORY OF PEPTIC ULCER DISEASE 11/28/2008    Past Medical History:  Diagnosis Date   Allergy    Arthritis, degenerative    both knees   B12 deficiency 07/06/2014   Cancer ([place])    Colitis    Common migraine with intractable migraine 07/12/2020   Diverticulosis    Duodenitis    Eczema    Fatty liver    Gastritis    GERD (gastroesophageal reflux disease)    Heart murmur    Helicobacter pylori gastritis    Hyperlipidemia    Hypertension    not treated, pt reports "no"   IBD (inflammatory bowel disease)    Lymphoma ([place])    Memory difficulty 01/05/2013   Memory loss    mild   Multiple sclerosis ([place])    Osteopenia    Vitamin B12 deficiency     Past Surgical History:  Procedure Laterality Date   ABDOMINAL HYSTERECTOMY  2010   BLADDER SURGERY  2010   Bladder Tack   CERVICAL SPINE SURGERY     CHOLECYSTECTOMY     HEMORRHOID SURGERY  ROTATOR CUFF REPAIR     SPINAL FUSION     TUBAL LIGATION      Social History   Tobacco Use   Smoking status: Former    Types: Cigarettes    Quit date: 06/27/1997    Years since quitting: 23.8   Smokeless tobacco: Never  Vaping Use   Vaping Use: Never used  Substance Use Topics   Alcohol use: No   Drug use: No    Family History  Problem Relation Age of Onset   Colon polyps Mother    Heart failure Mother    Diabetes Father    Heart disease Father    Colon polyps Brother    Hypertension Brother    Colon cancer Maternal Grandmother        ? may be duodenal cancer   Pancreatic cancer Maternal Uncle    Breast cancer Cousin    Esophageal cancer Neg Hx    Rectal cancer Neg Hx    Stomach cancer Neg  Hx     Allergies  Allergen Reactions   Sulfa Antibiotics Hives and Nausea And Vomiting   Vytorin [Ezetimibe-Simvastatin]     White skin patch   Aubagio [Teriflunomide]     diarrhea   Cephalexin    Doxycycline     Raw mouth    Erythromycin     REACTION: weakness   Macrobid [Nitrofurantoin Macrocrystal] Hives   Morphine Nausea And Vomiting   Rituxan [Rituximab]     Stated had issue with lungs after her last treatment   Sulfonamide Derivatives Hives and Nausea And Vomiting   Amoxicillin Hives    Medication list has been reviewed and updated.  Current Outpatient Medications on File Prior to Visit  Medication Sig Dispense Refill   augmented betamethasone dipropionate (DIPROLENE-AF) 0.05 % cream Apply topically.     b complex vitamins tablet Take 1 tablet by mouth daily.     Cholecalciferol (VITAMIN D3) 5000 units TABS Take 5,000 Units by mouth daily.      clobetasol (TEMOVATE) 0.05 % external solution Apply topically.     desonide (DESOWEN) 0.05 % lotion APPLY TOPICALLY 2 (TWO) TIMES DAILY. USE AS NEEDED FOR ITCHY EARS 59 mL 1   fenofibrate (TRICOR) 145 MG tablet TAKE 1 TABLET BY MOUTH EVERY DAY 90 tablet 3   gabapentin (NEURONTIN) 600 MG tablet TAKE 1 TABLET BY MOUTH THREE TIMES A DAY 270 tablet 3   Glucosamine 500 MG TABS Take 1 tablet by mouth 2 (two) times daily.     loratadine (CLARITIN) 10 MG tablet Take 1 tablet (10 mg total) by mouth daily as needed for allergies.     memantine (NAMENDA) 10 MG tablet TAKE 1 TABLET BY MOUTH TWICE A DAY 180 tablet 3   Multiple Vitamin (MULTIVITAMIN) tablet Take 1 tablet by mouth daily.      Probiotic Product (ALIGN PO) Take 1 tablet by mouth daily.      rosuvastatin (CRESTOR) 20 MG tablet TAKE 1 TABLET BY MOUTH EVERY DAY 90 tablet 3   triamcinolone ointment (KENALOG) 0.1 % SMARTSIG:Sparingly Topical Twice Daily     valsartan (DIOVAN) 80 MG tablet Take 1 tablet (80 mg total) by mouth daily. (Patient taking differently: Take 80 mg by mouth  daily. Holding for now.) 90 tablet 3   No current facility-administered medications on file prior to visit.    Review of Systems:  As per HPI- otherwise negative.   Physical Examination: Vitals:   04/29/21 0940  BP: 120/60  Pulse:  97  Resp: 18  Temp: 97.8 F (36.6 C)  SpO2: 100%   Vitals:   04/29/21 0940  Weight: 136 lb (61.7 kg)  Height: 5' 6"  (1.676 m)   Body mass index is 21.95 kg/m. Ideal Body Weight: Weight in (lb) to have BMI = 25: 154.6 Looks well, normal weight  GEN: No acute distress; alert,appropriate. PULM: Breathing comfortably in no respiratory distress PSYCH: Normally interactive.  Fungal involvement of several nails       Assessment and Plan: Onychomycosis - Plan: CBC, Comprehensive metabolic panel, terbinafine (LAMISIL) 250 MG tablet Pt seen today with onychomycosis Will start on lamisil for 12 weeks assuming labs are ok- asked pt to hold on filling rx until I get in touch with her    Signed Lamar Blinks, MD  Addendum 1/17, received her labs as below.  Message to patient  Results for orders placed or performed in visit on 04/29/21  CBC  Result Value Ref Range   WBC 3.2 (L) 4.0 - 10.5 K/uL   RBC 4.42 3.87 - 5.11 Mil/uL   Platelets 197.0 150.0 - 400.0 K/uL   Hemoglobin 13.5 12.0 - 15.0 g/dL   HCT 41.9 36.0 - 46.0 %   MCV 94.9 78.0 - 100.0 fl   MCHC 32.3 30.0 - 36.0 g/dL   RDW 14.2 11.5 - 15.5 %  Comprehensive metabolic panel  Result Value Ref Range   Sodium 140 135 - 145 mEq/L   Potassium 3.9 3.5 - 5.1 mEq/L   Chloride 103 96 - 112 mEq/L   CO2 28 19 - 32 mEq/L   Glucose, Bld 118 (H) 70 - 99 mg/dL   BUN 27 (H) 6 - 23 mg/dL   Creatinine, Ser 0.98 0.40 - 1.20 mg/dL   Total Bilirubin 0.6 0.2 - 1.2 mg/dL   Alkaline Phosphatase 57 39 - 117 U/L   AST 34 0 - 37 U/L   ALT 17 0 - 35 U/L   Total Protein 7.3 6.0 - 8.3 g/dL   Albumin 4.4 3.5 - 5.2 g/dL   GFR 54.90 (L) >60.00 mL/min   Calcium 9.6 8.4 - 10.5 mg/dL

## 2021-04-29 ENCOUNTER — Ambulatory Visit (INDEPENDENT_AMBULATORY_CARE_PROVIDER_SITE_OTHER): Payer: Medicare HMO | Admitting: Family Medicine

## 2021-04-29 VITALS — BP 120/60 | HR 97 | Temp 97.8°F | Resp 18 | Ht 66.0 in | Wt 136.0 lb

## 2021-04-29 DIAGNOSIS — D709 Neutropenia, unspecified: Secondary | ICD-10-CM | POA: Diagnosis not present

## 2021-04-29 DIAGNOSIS — B351 Tinea unguium: Secondary | ICD-10-CM | POA: Diagnosis not present

## 2021-04-29 LAB — CBC
HCT: 41.9 % (ref 36.0–46.0)
Hemoglobin: 13.5 g/dL (ref 12.0–15.0)
MCHC: 32.3 g/dL (ref 30.0–36.0)
MCV: 94.9 fl (ref 78.0–100.0)
Platelets: 197 10*3/uL (ref 150.0–400.0)
RBC: 4.42 Mil/uL (ref 3.87–5.11)
RDW: 14.2 % (ref 11.5–15.5)
WBC: 3.2 10*3/uL — ABNORMAL LOW (ref 4.0–10.5)

## 2021-04-29 LAB — COMPREHENSIVE METABOLIC PANEL
ALT: 17 U/L (ref 0–35)
AST: 34 U/L (ref 0–37)
Albumin: 4.4 g/dL (ref 3.5–5.2)
Alkaline Phosphatase: 57 U/L (ref 39–117)
BUN: 27 mg/dL — ABNORMAL HIGH (ref 6–23)
CO2: 28 mEq/L (ref 19–32)
Calcium: 9.6 mg/dL (ref 8.4–10.5)
Chloride: 103 mEq/L (ref 96–112)
Creatinine, Ser: 0.98 mg/dL (ref 0.40–1.20)
GFR: 54.9 mL/min — ABNORMAL LOW (ref >60.00)
Glucose, Bld: 118 mg/dL — ABNORMAL HIGH (ref 70–99)
Potassium: 3.9 mEq/L (ref 3.5–5.1)
Sodium: 140 mEq/L (ref 135–145)
Total Bilirubin: 0.6 mg/dL (ref 0.2–1.2)
Total Protein: 7.3 g/dL (ref 6.0–8.3)

## 2021-04-29 MED ORDER — TERBINAFINE HCL 250 MG PO TABS: 250.0000 mg | ORAL_TABLET | Freq: Every day | ORAL | 0 refills | Status: DC

## 2021-04-30 ENCOUNTER — Encounter: Payer: Self-pay | Admitting: Family Medicine

## 2021-04-30 NOTE — Addendum Note (Signed)
Addended by: Lamar Blinks C on: 04/30/2021 05:18 AM   Modules accepted: Orders

## 2021-05-15 ENCOUNTER — Encounter: Payer: Self-pay | Admitting: Family Medicine

## 2021-05-15 ENCOUNTER — Ambulatory Visit (INDEPENDENT_AMBULATORY_CARE_PROVIDER_SITE_OTHER): Payer: Medicare HMO | Admitting: Family Medicine

## 2021-05-15 VITALS — BP 112/60 | HR 113 | Temp 97.9°F | Resp 18 | Ht 65.5 in | Wt 133.8 lb

## 2021-05-15 DIAGNOSIS — R1030 Lower abdominal pain, unspecified: Secondary | ICD-10-CM

## 2021-05-15 DIAGNOSIS — N939 Abnormal uterine and vaginal bleeding, unspecified: Secondary | ICD-10-CM

## 2021-05-15 LAB — POCT URINALYSIS DIP (MANUAL ENTRY)
Bilirubin, UA: NEGATIVE
Blood, UA: NEGATIVE
Glucose, UA: NEGATIVE mg/dL
Ketones, POC UA: NEGATIVE mg/dL
Leukocytes, UA: NEGATIVE
Nitrite, UA: NEGATIVE
Spec Grav, UA: 1.01 (ref 1.010–1.025)
Urobilinogen, UA: 0.2 E.U./dL
pH, UA: 7 (ref 5.0–8.0)

## 2021-05-15 MED ORDER — METRONIDAZOLE 500 MG PO TABS: 500.0000 mg | ORAL_TABLET | Freq: Three times a day (TID) | ORAL | 0 refills | Status: DC

## 2021-05-15 MED ORDER — CIPROFLOXACIN HCL 500 MG PO TABS: 500.0000 mg | ORAL_TABLET | Freq: Two times a day (BID) | ORAL | 0 refills | Status: AC

## 2021-05-15 NOTE — Progress Notes (Addendum)
[place] at [facility] [address] [place], [place] [address] [phone]  Date:  05/15/2021   Name:  [name]   DOB:  1940-10-22   MRN:  [id number]  PCP:  Darreld Mclean, MD    Chief Complaint: Crohn's Disease (Flare x 2-3 days/) and L ear pain (Frequent infections. Pt thinks she has another infection. )   History of Present Illness:  [name] is a 81 y.o. very pleasant female patient who presents with the following:  Pt with history of Crohn's colitis - here today with concern of possible diverticulitis History of multiple sclerosis, osteopenia, Crohn's disease and diverticulitis, dilated cardiomyopathy, IBD.  She also has lymphoma which is being followed by Dr. Talbert Cage at [place] visit with myself 1/16 for toenail fungus   She had a serious episode of diverticulitis back in 2019-at that time her situation was complicated by immunosuppression with chemotherapy/neutropenic fever  She has not been aware of any diverticulitis episodes since that time She also states her Crohn's disease is not typically an issue, she does not regularly follow-up with GI  Pt notes mild lower abd pain on both sides since this am She is feeling actually better than she did earlier today No vomiting, no diarrhea She is eating less than normal but still able to eat No urinary sx but she is prone to UTI No fever noted   Patient also mentions she had just a few drops of blood from her vagina perhaps 3 weeks ago.  She is status post hysterectomy  Patient Active Problem List   Diagnosis Date Noted   Eczema 03/13/2021   Prediabetes 10/30/2020   Common migraine with intractable migraine 07/12/2020   Vitamin B12 deficiency    Memory loss    Lymphoma ([place])    IBD (inflammatory bowel disease)    Hypertension    Hyperlipidemia    Helicobacter pylori gastritis    Heart murmur    GERD (gastroesophageal reflux disease)    Gastritis     Fatty liver    Diverticulosis    Colitis    Cancer (HCC)    Arthritis, degenerative    Allergy    New onset headache 09/26/2019   Dilated cardiomyopathy ([place]) 03/02/2019   Osteopenia 12/25/2018   Hypokalemia 02/28/2018   Sepsis ([place]) 02/27/2018   Diverticulitis 02/27/2018   Diffuse large B-cell lymphoma ([place]) 02/27/2018   Osteoarthritis of cervical spine 09/15/2016   Abnormality of gait 11/07/2015   Dizziness and giddiness 11/07/2015   Mild cognitive impairment 01/09/2015   B12 deficiency 07/06/2014   Leukopenia 05/06/2013   Memory difficulty 01/05/2013   Dyslipidemia 11/28/2008   Multiple sclerosis ([place]) 11/28/2008   INFLAMMATORY BOWEL DISEASE 11/28/2008   DIVERTICULOSIS, COLON 11/28/2008   PERSONAL HISTORY OF PEPTIC ULCER DISEASE 11/28/2008    Past Medical History:  Diagnosis Date   Allergy    Arthritis, degenerative    both knees   B12 deficiency 07/06/2014   Cancer ([place])    Colitis    Common migraine with intractable migraine 07/12/2020   Diverticulosis    Duodenitis    Eczema    Fatty liver    Gastritis    GERD (gastroesophageal reflux disease)    Heart murmur    Helicobacter pylori gastritis    Hyperlipidemia    Hypertension    not treated, pt reports "no"   IBD (inflammatory bowel disease)    Lymphoma ([place])  Memory difficulty 01/05/2013   Memory loss    mild   Multiple sclerosis ([place])    Osteopenia    Vitamin B12 deficiency     Past Surgical History:  Procedure Laterality Date   ABDOMINAL HYSTERECTOMY  2010   BLADDER SURGERY  2010   Bladder Tack   CERVICAL SPINE SURGERY     CHOLECYSTECTOMY     HEMORRHOID SURGERY     ROTATOR CUFF REPAIR     SPINAL FUSION     TUBAL LIGATION      Social History   Tobacco Use   Smoking status: Former    Types: Cigarettes    Quit date: 06/27/1997    Years since quitting: 23.8   Smokeless tobacco: Never  Vaping Use   Vaping Use: Never used  Substance Use Topics   Alcohol use: No   Drug use: No     Family History  Problem Relation Age of Onset   Colon polyps Mother    Heart failure Mother    Diabetes Father    Heart disease Father    Colon polyps Brother    Hypertension Brother    Colon cancer Maternal Grandmother        ? may be duodenal cancer   Pancreatic cancer Maternal Uncle    Breast cancer Cousin    Esophageal cancer Neg Hx    Rectal cancer Neg Hx    Stomach cancer Neg Hx     Allergies  Allergen Reactions   Sulfa Antibiotics Hives and Nausea And Vomiting   Vytorin [Ezetimibe-Simvastatin]     White skin patch   Aubagio [Teriflunomide]     diarrhea   Cephalexin    Doxycycline     Raw mouth    Erythromycin     REACTION: weakness   Macrobid [Nitrofurantoin Macrocrystal] Hives   Morphine Nausea And Vomiting   Rituxan [Rituximab]     Stated had issue with lungs after her last treatment   Sulfonamide Derivatives Hives and Nausea And Vomiting   Amoxicillin Hives    Medication list has been reviewed and updated.  Current Outpatient Medications on File Prior to Visit  Medication Sig Dispense Refill   augmented betamethasone dipropionate (DIPROLENE-AF) 0.05 % cream Apply topically.     b complex vitamins tablet Take 1 tablet by mouth daily.     Cholecalciferol (VITAMIN D3) 5000 units TABS Take 5,000 Units by mouth daily.      clobetasol (TEMOVATE) 0.05 % external solution Apply topically.     desonide (DESOWEN) 0.05 % lotion APPLY TOPICALLY 2 (TWO) TIMES DAILY. USE AS NEEDED FOR ITCHY EARS 59 mL 1   fenofibrate (TRICOR) 145 MG tablet TAKE 1 TABLET BY MOUTH EVERY DAY 90 tablet 3   gabapentin (NEURONTIN) 600 MG tablet TAKE 1 TABLET BY MOUTH THREE TIMES A DAY 270 tablet 3   Glucosamine 500 MG TABS Take 1 tablet by mouth 2 (two) times daily.     loratadine (CLARITIN) 10 MG tablet Take 1 tablet (10 mg total) by mouth daily as needed for allergies.     memantine (NAMENDA) 10 MG tablet TAKE 1 TABLET BY MOUTH TWICE A DAY 180 tablet 3   Multiple Vitamin  (MULTIVITAMIN) tablet Take 1 tablet by mouth daily.      Probiotic Product (ALIGN PO) Take 1 tablet by mouth daily.      rosuvastatin (CRESTOR) 20 MG tablet TAKE 1 TABLET BY MOUTH EVERY DAY 90 tablet 3   terbinafine (LAMISIL) 250 MG tablet Take  1 tablet (250 mg total) by mouth daily. Take daily for 12 weeks for toenail fungus 90 tablet 0   triamcinolone ointment (KENALOG) 0.1 % SMARTSIG:Sparingly Topical Twice Daily     valsartan (DIOVAN) 80 MG tablet Take 1 tablet (80 mg total) by mouth daily. (Patient taking differently: Take 80 mg by mouth daily. Holding for now.) 90 tablet 3   No current facility-administered medications on file prior to visit.    Review of Systems:  As per HPI- otherwise negative.   Physical Examination: Vitals:   05/15/21 1606 05/15/21 1652  BP: 112/60   Pulse: (!) 113 (P) 96  Resp: 18   Temp: 97.9 F (36.6 C)   SpO2: 97%    Vitals:   05/15/21 1606  Weight: 133 lb 12.8 oz (60.7 kg)  Height: 5' 5.5" (1.664 m)   Body mass index is 21.93 kg/m. Ideal Body Weight: Weight in (lb) to have BMI = 25: 152.2  GEN: no acute distress.  Looks well, slim build.  Appears her normal self HEENT: Atraumatic, Normocephalic.  Ears and Nose: No external deformity. CV: RRR, No M/G/R. No JVD. No thrill. No extra heart sounds. PULM: CTA B, no wheezes, crackles, rhonchi. No retractions. No resp. distress. No accessory muscle use. ABD: S, ND, +BS. No rebound. No HSM.  She has minimal tenderness in the bilateral lower quadrants EXTR: No c/c/e PSYCH: Normally interactive. Conversant.  GU: normal post- hysterectomy exam, no lesions or bleeding noted   Pulse Readings from Last 3 Encounters:  05/15/21 (P) 96  04/29/21 97  03/19/21 85   BP Readings from Last 3 Encounters:  05/15/21 112/60  04/29/21 120/60  03/19/21 (!) 162/90     Assessment and Plan: Lower abdominal pain - Plan: CBC, Comprehensive metabolic panel, POCT urinalysis dipstick, ciprofloxacin (CIPRO) 500 MG  tablet, metroNIDAZOLE (FLAGYL) 500 MG tablet, CANCELED: Urine Culture  Vaginal bleeding  Patient seen today with concern of lower abdominal pain.  This reminds her of when she had diverticulitis in 2019.  However her symptoms are less severe, and she has improved since this morning.  For the time being we will obtain lab work as above, and I encouraged a soft low residue diet.  However, if she does not continue to improve/is not feeling better tomorrow we will have her start Cipro and Flagyl and also consider a CT scan.  Referencing CT scan 2011 it appears she does still have her appendix, so if not improving appendicitis would also be a consideration  She is advised to please seek care if getting worse in the meantime  signed Lamar Blinks, MD Addnd 2/2 Received labs as below- message to pt  Results for orders placed or performed in visit on 05/15/21  CBC  Result Value Ref Range   WBC 6.1 4.0 - 10.5 K/uL   RBC 4.25 3.87 - 5.11 Mil/uL   Platelets 205.0 150.0 - 400.0 K/uL   Hemoglobin 13.1 12.0 - 15.0 g/dL   HCT 40.1 36.0 - 46.0 %   MCV 94.6 78.0 - 100.0 fl   MCHC 32.6 30.0 - 36.0 g/dL   RDW 14.3 11.5 - 15.5 %  Comprehensive metabolic panel  Result Value Ref Range   Sodium 137 135 - 145 mEq/L   Potassium 4.4 3.5 - 5.1 mEq/L   Chloride 102 96 - 112 mEq/L   CO2 29 19 - 32 mEq/L   Glucose, Bld 125 (H) 70 - 99 mg/dL   BUN 18 6 - 23 mg/dL  Creatinine, Ser 1.07 0.40 - 1.20 mg/dL   Total Bilirubin 0.5 0.2 - 1.2 mg/dL   Alkaline Phosphatase 58 39 - 117 U/L   AST 34 0 - 37 U/L   ALT 18 0 - 35 U/L   Total Protein 7.6 6.0 - 8.3 g/dL   Albumin 4.3 3.5 - 5.2 g/dL   GFR 49.39 (L) >60.00 mL/min   Calcium 9.4 8.4 - 10.5 mg/dL  POCT urinalysis dipstick  Result Value Ref Range   Color, UA yellow yellow   Clarity, UA clear clear   Glucose, UA negative negative mg/dL   Bilirubin, UA negative negative   Ketones, POC UA negative negative mg/dL   Spec Grav, UA 1.010 1.010 - 1.025   Blood,  UA negative negative   pH, UA 7.0 5.0 - 8.0   Protein Ur, POC trace (A) negative mg/dL   Urobilinogen, UA 0.2 0.2 or 1.0 E.U./dL   Nitrite, UA Negative Negative   Leukocytes, UA Negative Negative

## 2021-05-15 NOTE — Patient Instructions (Signed)
It was good to see you today I will be in touch with your labs Try a soft, easy to digest diet and plenty of liquids I will be in touch with your labs asap If you are not containing to improve tomorrow start on the cipro and flagyl antibiotic  Please contact me also if not improving- we may need to get a CT in this case

## 2021-05-16 ENCOUNTER — Encounter: Payer: Self-pay | Admitting: Family Medicine

## 2021-05-16 LAB — COMPREHENSIVE METABOLIC PANEL
ALT: 18 U/L (ref 0–35)
AST: 34 U/L (ref 0–37)
Albumin: 4.3 g/dL (ref 3.5–5.2)
Alkaline Phosphatase: 58 U/L (ref 39–117)
BUN: 18 mg/dL (ref 6–23)
CO2: 29 mEq/L (ref 19–32)
Calcium: 9.4 mg/dL (ref 8.4–10.5)
Chloride: 102 mEq/L (ref 96–112)
Creatinine, Ser: 1.07 mg/dL (ref 0.40–1.20)
GFR: 49.39 mL/min — ABNORMAL LOW (ref >60.00)
Glucose, Bld: 125 mg/dL — ABNORMAL HIGH (ref 70–99)
Potassium: 4.4 mEq/L (ref 3.5–5.1)
Sodium: 137 mEq/L (ref 135–145)
Total Bilirubin: 0.5 mg/dL (ref 0.2–1.2)
Total Protein: 7.6 g/dL (ref 6.0–8.3)

## 2021-05-16 LAB — CBC
HCT: 40.1 % (ref 36.0–46.0)
Hemoglobin: 13.1 g/dL (ref 12.0–15.0)
MCHC: 32.6 g/dL (ref 30.0–36.0)
MCV: 94.6 fl (ref 78.0–100.0)
Platelets: 205 10*3/uL (ref 150.0–400.0)
RBC: 4.25 Mil/uL (ref 3.87–5.11)
RDW: 14.3 % (ref 11.5–15.5)
WBC: 6.1 10*3/uL (ref 4.0–10.5)

## 2021-06-24 DIAGNOSIS — Z1231 Encounter for screening mammogram for malignant neoplasm of breast: Secondary | ICD-10-CM | POA: Diagnosis not present

## 2021-06-24 LAB — HM MAMMOGRAPHY

## 2021-06-26 ENCOUNTER — Ambulatory Visit: Payer: Medicare HMO | Admitting: Cardiology

## 2021-06-26 DIAGNOSIS — F4323 Adjustment disorder with mixed anxiety and depressed mood: Secondary | ICD-10-CM | POA: Diagnosis not present

## 2021-06-26 DIAGNOSIS — R69 Illness, unspecified: Secondary | ICD-10-CM | POA: Diagnosis not present

## 2021-07-04 DIAGNOSIS — R69 Illness, unspecified: Secondary | ICD-10-CM | POA: Diagnosis not present

## 2021-07-04 DIAGNOSIS — F4320 Adjustment disorder, unspecified: Secondary | ICD-10-CM | POA: Diagnosis not present

## 2021-07-05 NOTE — Progress Notes (Addendum)
Therapist, music at [facility] ?[address], [address] ?[place], [place] [address] ?336 [phone] ?Fax [phone] ? ?Date:  07/15/2021  ? ?Name:  [name]   DOB:  Oct 22, 1940   MRN:  [id number] ? ?PCP:  Vicki Mclean, MD  ? ? ?Chief Complaint: 6 month follow up (Concerns/ questions: 1. Still has the fungus on nail. 2. More fatigued around noon. 3. Slower urine output. 4. Eczema issues. 5. Hx of MS- she could feel is after her yoga class on Friday. 6. PET scan at [facility] in November- she asks if she needs the 3-6 month follow up. 7. Increased Anxiety./Zoster:) ? ? ?History of Present Illness: ? ?[name] is a 81 y.o. very pleasant female patient who presents with the following: ? ?Pt seen today for a follow-up month visit- History of multiple sclerosis, osteopenia, Crohn's disease and diverticulitis, dilated cardiomyopathy, IBD.  She also has lymphoma which is being followed by Dr. Talbert Holder at [facility] ? ?Last seen by myself in February for abd pain  ? ?Seen by cardiology in December, Dr [name]: ?Stable cardiomyopathy EF has not changed I think she has stage II hypertension I1 paced on ARB avoid rate slowing medications of bifascicular heart block but she wants to check her blood pressures for 1 week and I did give her a prescription for valsartan.  I do not think we need to start G MDT with stable ejection fraction over time ?Stable no anginal discomfort she has had a previous normal myocardial perfusion study ?Continue her statin recent lipids 01/14/2021 cholesterol 99 LDL 42 triglycerides 289 HDL 33.5 ?Able EKG pattern avoid rate slowing medications if syncope occurred she need EP evaluation and likely pacemaker ? ?Seen by [facility] oncology Dr [name] in November - [name] treatment.  She asked me today about their follow-up imaging plans-explained that I defer to oncology regarding his recommendations ?Can recheck BMP today- her GFR is slipping slightly ? ?We discussed her [name] has more  months to go up terbinafine.  She was concerned that discoloration and thickening is still present.  I explained that terbinafine will not clear the nail she has currently, but will allow new healthy nail to grow in ? ?She notes a "slower flow of urine" for several weeks but no other symptoms such as dysuria or hematuria ?She is feeling more tired around lunchtime- she has noticed this for a few weeks ?Normal vit D 10/21 ? ?Unfortunately she is under a fair amount of stress today-  ?She notes that her husband has started thinking she had "affairs all our married life" - they have been married for nearly 50 years and [name] states she has never been unfaithful.  She is confused about why this is happening, states that her husband keeps bringing up a boyfriend she had prior to their marriage 10 years ago ? ?These issues have been going on for about 6 weeks now and are very stressful for her.  She and her husband are supposed to take an overseas vacation coming up and she is concerned about how this will go.  She states she feels safe, she is not concerned about domestic violence. ? ?They are seeing counselor separately right now.  I suggested that couples counseling could be helpful. ? ?She is seeing neurology coming up soon- Dr [name] retired. She is seeing Dr [name] next week. She does have mild cognitive impairment, taking Namenda  ? ?She has lost some weight due to her husband  not wanting to eat meals with her  ? ?Past Medical History:  ?Diagnosis Date  ? Allergy   ? Arthritis, degenerative   ? both knees  ? B12 deficiency 07/06/2014  ? Cancer [facility])   ? Colitis   ? Common migraine with intractable migraine 07/12/2020  ? Diverticulosis   ? Duodenitis   ? Eczema   ? Fatty liver   ? Gastritis   ? GERD (gastroesophageal reflux disease)   ? Heart murmur   ? Helicobacter pylori gastritis   ? Hyperlipidemia   ? Hypertension   ? not treated, pt reports "no"  ? IBD (inflammatory bowel disease)   ? Lymphoma ([place])   ? Memory  difficulty 01/05/2013  ? Memory loss   ? mild  ? Multiple sclerosis ([place])   ? Osteopenia   ? Vitamin B12 deficiency   ? ? ?Past Surgical History:  ?Procedure Laterality Date  ? ABDOMINAL HYSTERECTOMY  2010  ? BLADDER SURGERY  2010  ? Bladder Tack  ? CERVICAL SPINE SURGERY    ? CHOLECYSTECTOMY    ? HEMORRHOID SURGERY    ? ROTATOR CUFF REPAIR    ? SPINAL FUSION    ? TUBAL LIGATION    ? ? ?Social History  ? ?Tobacco Use  ? Smoking status: Former  ?  Types: Cigarettes  ?  Quit date: 06/27/1997  ?  Years since quitting: 24.0  ? Smokeless tobacco: Never  ?Vaping Use  ? Vaping Use: Never used  ?Substance Use Topics  ? Alcohol use: No  ? Drug use: No  ? ? ?Family History  ?Problem Relation Age of Onset  ? Colon polyps Mother   ? Heart failure Mother   ? Diabetes Father   ? Heart disease Father   ? Colon polyps Brother   ? Hypertension Brother   ? Colon cancer Maternal Grandmother   ?     ? may be duodenal cancer  ? Pancreatic cancer Maternal Uncle   ? Breast cancer Cousin   ? Esophageal cancer Neg Hx   ? Rectal cancer Neg Hx   ? Stomach cancer Neg Hx   ? ? ?Allergies  ?Allergen Reactions  ? Sulfa Antibiotics Hives and Nausea And Vomiting  ? Vytorin [Ezetimibe-Simvastatin]   ?  White skin patch  ? Aubagio [Teriflunomide]   ?  diarrhea  ? Cephalexin   ? Doxycycline   ?  Raw mouth   ? Erythromycin   ?  REACTION: weakness  ? Macrobid [Nitrofurantoin Macrocrystal] Hives  ? Morphine Nausea And Vomiting  ? Rituxan [Rituximab]   ?  Stated had issue with lungs after her last treatment  ? Sulfonamide Derivatives Hives and Nausea And Vomiting  ? Amoxicillin Hives  ? ? ?Medication list has been reviewed and updated. ? ?Current Outpatient Medications on File Prior to Visit  ?Medication Sig Dispense Refill  ? b complex vitamins tablet Take 1 tablet by mouth daily.    ? Cholecalciferol (VITAMIN D3) 5000 units TABS Take 5,000 Units by mouth daily.     ? fenofibrate (TRICOR) 145 MG tablet TAKE 1 TABLET BY MOUTH EVERY DAY 90 tablet 3  ?  fluocinonide ointment (LIDEX) 9.16 % 1 application    ? gabapentin (NEURONTIN) 600 MG tablet TAKE 1 TABLET BY MOUTH THREE TIMES A DAY 270 tablet 3  ? Glucosamine 500 MG TABS Take 1 tablet by mouth 2 (two) times daily.    ? loratadine (CLARITIN) 10 MG tablet Take 1 tablet (10 mg total)  by mouth daily as needed for allergies.    ? memantine (NAMENDA) 10 MG tablet TAKE 1 TABLET BY MOUTH TWICE A DAY 180 tablet 3  ? Multiple Vitamin (MULTIVITAMIN) tablet Take 1 tablet by mouth daily.     ? Probiotic Product (ALIGN PO) Take 1 tablet by mouth daily.     ? rosuvastatin (CRESTOR) 20 MG tablet TAKE 1 TABLET BY MOUTH EVERY DAY 90 tablet 3  ? terbinafine (LAMISIL) 250 MG tablet Take 1 tablet (250 mg total) by mouth daily. Take daily for 12 weeks for toenail fungus 90 tablet 0  ? triamcinolone ointment (KENALOG) 0.1 % SMARTSIG:Sparingly Topical Twice Daily    ? ?No current facility-administered medications on file prior to visit.  ? ? ?Review of Systems: ? ?As per HPI- otherwise negative. ? ? ?Physical Examination: ?Vitals:  ? 07/15/21 0819  ?BP: 120/78  ?Pulse: 85  ?Resp: 18  ?Temp: 97.6 ?F (36.4 ?C)  ?SpO2: 98%  ? ?Vitals:  ? 07/15/21 0819  ?Weight: 128 lb 9.6 oz (58.3 kg)  ? ?Body mass index is 21.07 kg/m?. ?Ideal Body Weight:   ? ?GEN: no acute distress.  Petite build, looks well ?HEENT: Atraumatic, Normocephalic.  ?Ears and Nose: No external deformity. ?CV: RRR, No M/G/R. No JVD. No thrill. No extra heart sounds. ?PULM: CTA B, no wheezes, crackles, rhonchi. No retractions. No resp. distress. No accessory muscle use. ?ABD: S, NT, ND, +BS. No rebound. No HSM. ?EXTR: No c/c/e ?PSYCH: Normally interactive. Conversant.  ?Evidence of onychomycosis and great toenails especially ?Wt Readings from Last 3 Encounters:  ?07/15/21 128 lb 9.6 oz (58.3 kg)  ?05/15/21 133 lb 12.8 oz (60.7 kg)  ?04/29/21 136 lb (61.7 kg)  ? ?Results for orders placed or performed in visit on 07/15/21  ?POCT urinalysis dipstick  ?Result Value Ref Range  ?  Color, UA yellow yellow  ? Clarity, UA clear clear  ? Glucose, UA negative negative mg/dL  ? Bilirubin, UA negative negative  ? Ketones, POC UA negative negative mg/dL  ? Spec Grav, UA 1.010 1.010 - 1.025  ? [place]

## 2021-07-10 DIAGNOSIS — R69 Illness, unspecified: Secondary | ICD-10-CM | POA: Diagnosis not present

## 2021-07-10 DIAGNOSIS — F4320 Adjustment disorder, unspecified: Secondary | ICD-10-CM | POA: Diagnosis not present

## 2021-07-15 ENCOUNTER — Ambulatory Visit (INDEPENDENT_AMBULATORY_CARE_PROVIDER_SITE_OTHER): Payer: Medicare HMO | Admitting: Family Medicine

## 2021-07-15 ENCOUNTER — Encounter: Payer: Self-pay | Admitting: Family Medicine

## 2021-07-15 VITALS — BP 120/78 | HR 85 | Temp 97.6°F | Resp 18 | Wt 128.6 lb

## 2021-07-15 DIAGNOSIS — R350 Frequency of micturition: Secondary | ICD-10-CM | POA: Diagnosis not present

## 2021-07-15 DIAGNOSIS — R5383 Other fatigue: Secondary | ICD-10-CM

## 2021-07-15 DIAGNOSIS — B351 Tinea unguium: Secondary | ICD-10-CM | POA: Diagnosis not present

## 2021-07-15 DIAGNOSIS — N1831 Chronic kidney disease, stage 3a: Secondary | ICD-10-CM | POA: Diagnosis not present

## 2021-07-15 DIAGNOSIS — R69 Illness, unspecified: Secondary | ICD-10-CM | POA: Diagnosis not present

## 2021-07-15 DIAGNOSIS — F439 Reaction to severe stress, unspecified: Secondary | ICD-10-CM

## 2021-07-15 LAB — BASIC METABOLIC PANEL
BUN: 18 mg/dL (ref 6–23)
CO2: 30 mEq/L (ref 19–32)
Calcium: 9.8 mg/dL (ref 8.4–10.5)
Chloride: 101 mEq/L (ref 96–112)
Creatinine, Ser: 0.97 mg/dL (ref 0.40–1.20)
GFR: 55.5 mL/min — ABNORMAL LOW (ref >60.00)
Glucose, Bld: 89 mg/dL (ref 70–99)
Potassium: 4.3 mEq/L (ref 3.5–5.1)
Sodium: 140 mEq/L (ref 135–145)

## 2021-07-15 LAB — POCT URINALYSIS DIP (MANUAL ENTRY)
Bilirubin, UA: NEGATIVE
Blood, UA: NEGATIVE
Glucose, UA: NEGATIVE mg/dL
Ketones, POC UA: NEGATIVE mg/dL
Leukocytes, UA: NEGATIVE
Nitrite, UA: NEGATIVE
Protein Ur, POC: NEGATIVE mg/dL
Spec Grav, UA: 1.01 (ref 1.010–1.025)
Urobilinogen, UA: 0.2 E.U./dL
pH, UA: 7 (ref 5.0–8.0)

## 2021-07-15 LAB — FERRITIN: Ferritin: 114.5 ng/mL (ref 10.0–291.0)

## 2021-07-15 LAB — VITAMIN D 25 HYDROXY (VIT D DEFICIENCY, FRACTURES): VITD: 69.63 ng/mL (ref 30.00–100.00)

## 2021-07-15 LAB — TSH: TSH: 2.33 u[IU]/mL (ref 0.35–5.50)

## 2021-07-15 NOTE — Patient Instructions (Addendum)
It was good to see you today ?Please let me know if you need any help with anxiety ?Your toenails should continue to grow out with healthy nail  ? ?I will check labs today to look for any cause of your fatigue  ?We will also check your urine today ?You have lost some weight- work on eating regular healthy meals and snacks to gain it back ? ?Assuming all is ok please see me in 4-6 months  ?

## 2021-07-16 ENCOUNTER — Encounter: Payer: Self-pay | Admitting: Family Medicine

## 2021-07-16 DIAGNOSIS — R69 Illness, unspecified: Secondary | ICD-10-CM | POA: Diagnosis not present

## 2021-07-16 DIAGNOSIS — F4320 Adjustment disorder, unspecified: Secondary | ICD-10-CM | POA: Diagnosis not present

## 2021-07-16 LAB — URINE CULTURE
MICRO NUMBER:: 13213988
SPECIMEN QUALITY:: ADEQUATE

## 2021-07-24 ENCOUNTER — Encounter: Payer: Self-pay | Admitting: Neurology

## 2021-07-24 ENCOUNTER — Ambulatory Visit: Payer: Medicare HMO | Admitting: Neurology

## 2021-07-24 VITALS — BP 139/89 | HR 98 | Ht 65.5 in | Wt 128.0 lb

## 2021-07-24 DIAGNOSIS — G43019 Migraine without aura, intractable, without status migrainosus: Secondary | ICD-10-CM | POA: Diagnosis not present

## 2021-07-24 DIAGNOSIS — G35 Multiple sclerosis: Secondary | ICD-10-CM

## 2021-07-24 DIAGNOSIS — R413 Other amnesia: Secondary | ICD-10-CM

## 2021-07-24 DIAGNOSIS — R269 Unspecified abnormalities of gait and mobility: Secondary | ICD-10-CM | POA: Diagnosis not present

## 2021-07-24 NOTE — Progress Notes (Signed)
? ?[facility] ? ?PATIENT: [name] ?DOB: [birth year] ? ?REFERRING DOCTOR OR PCP: Lamar Blinks, MD ?SOURCE: Patient, notes from Dr. Jannifer Franklin, imaging and lab reports, notes from [facility] oncology, MRI images personally reviewed. ? ?_________________________________ ? ? ?HISTORICAL ? ?CHIEF COMPLAINT:  ?Chief Complaint  ?Patient presents with  ? Follow-up  ?  Rm 2, alone. Here for 6 month migraine and MS f/u, off DMT. TOC from Dr. Jannifer Franklin. L hand is locking up per pt last couple weeks. Having more weakness on L side, leg and arm. Under lots of stress recently. Achy L shoulder. Migraines are once a month, frontal HA.   ? ? ?HISTORY OF PRESENT ILLNESS:  ?[name] is a 81 y.o. woman with a history of multiple sclerosis and B-cell lymphoma.  She is transferring care after Dr. Tobey Grim retirement. ? ?Initial visit 07/24/2021: ?Currently, her main symptoms are mild intermittent left sided weakness and numbness.  She has had some tightness in the left hand.  She is noting more issues with left arm strength if she uses the arm more.    She walks 1/3 mile x 3 every day and does yoga.    She can easily do a mile.   She can go up and down stairs without the rail.     She had some vertigo helped by PT recently.   She has some urinary hesitancy.  She has Crohn's disease and has had some fecal incontinence since surgery years ago.  Vision is stable. ? ?She denies much fatigue..    Denies depression.   She had anxiety in 2019 and some stress.  She sleeps well most nights.   She and her husband have had some marital stress lately.  She has had some mild cognitive issues and takes memantine.  She does have mild atrophy on the MRI. ? ?She has migraines once a month.   She rarely (every few months)  and takes Advil.  She takes a lorazepam if migraine is more intense.    ? ?She leaves on vacation ([place] and [place] next week. ? ?Her last PET scan at [facility] 02/13/2021 showed no active lymphoma.  She follows up [facility]  heme-onc  regularly. ?  ?MS history ?She was diagnosed with MS in 2000 when she presented with numbness in both hands and left sided numbness   MRI was consistent with MS.   CSF was also c/w MS.     In retrospect, she had.left optic neuritis 1-2 years prior  that corrected itself without treatment.   She was placed on Betaseron initially.   She was placed on Gilenya in 2011.     She had large cell B cell lymphoma.  She was on Rituxan and CHOP and other medications followed by radiation (cancer was in her kidneys.    She sees [place] (Dr. Mina Marble) for the cancer and they feel she is in permanent remission.    Her last exacerbation (left foot drop) was in 2013 though  ? ? ?IMAGING: ?MRI brain 11/02/2020 showed Multiple T2/FLAIR hyperintense foci in the hemispheres and a couple foci in the cerebellum.  These are nonspecific and the majority of the foci have an appearance most consistent with chronic microvascular ischemic change.  Overlap with demyelination is possible.  None of the foci appear to be acute.  They do not enhance.  Compared to the MRI from 2019 2021, there are no new lesions.      Mild stable generalized cortical atrophy.   ? ?  MRI of the lumbar spine October 06, 2017 showed a small left disc extrusion at L2-L3 level and mild spinal canal stenosis. There is mild multilevel degenerative disc disease. MRI  ? ?REVIEW OF SYSTEMS: ?Constitutional: No fevers, chills, sweats, or change in appetite ?Eyes: No visual changes, double vision, eye pain ?Ear, nose and throat: No hearing loss, ear pain, nasal congestion, sore throat ?Cardiovascular: No chest pain, palpitations ?Respiratory:  No shortness of breath at rest or with exertion.   No wheezes ?GastrointestinaI: No nausea, vomiting, diarrhea, abdominal pain.  Rare ecal incontinence ?Genitourinary:  No dysuria, urinary retention or frequency.  She has some hesitancy.  No nocturia. ?Musculoskeletal:  No neck pain, back pain ?Integumentary: No rash, pruritus, skin  lesions ?Neurological: as above ?Psychiatric: No depression at this time.  No anxiety ?Endocrine: No palpitations, diaphoresis, change in appetite, change in weigh or increased thirst ?Hematologic/Lymphatic:  No anemia, purpura, petechiae. ?Allergic/Immunologic: No itchy/runny eyes, nasal congestion, recent allergic reactions, rashes ? ?ALLERGIES: ?Allergies  ?Allergen Reactions  ? Sulfa Antibiotics Hives and Nausea And Vomiting  ? Vytorin [Ezetimibe-Simvastatin]   ?  White skin patch  ? Aubagio [Teriflunomide]   ?  diarrhea  ? Cephalexin   ? Doxycycline   ?  Raw mouth   ? Erythromycin   ?  REACTION: weakness  ? Macrobid [Nitrofurantoin Macrocrystal] Hives  ? Morphine Nausea And Vomiting  ? Rituxan [Rituximab]   ?  Stated had issue with lungs after her last treatment  ? Sulfonamide Derivatives Hives and Nausea And Vomiting  ? Amoxicillin Hives  ? ? ?HOME MEDICATIONS: ? ?Current Outpatient Medications:  ?  b complex vitamins tablet, Take 1 tablet by mouth daily., Disp: , Rfl:  ?  Cholecalciferol (VITAMIN D3) 5000 units TABS, Take 5,000 Units by mouth daily. , Disp: , Rfl:  ?  fenofibrate (TRICOR) 145 MG tablet, TAKE 1 TABLET BY MOUTH EVERY DAY, Disp: 90 tablet, Rfl: 3 ?  gabapentin (NEURONTIN) 600 MG tablet, TAKE 1 TABLET BY MOUTH THREE TIMES A DAY, Disp: 270 tablet, Rfl: 3 ?  Glucosamine 500 MG TABS, Take 1 tablet by mouth 2 (two) times daily., Disp: , Rfl:  ?  loratadine (CLARITIN) 10 MG tablet, Take 1 tablet (10 mg total) by mouth daily as needed for allergies., Disp: , Rfl:  ?  memantine (NAMENDA) 10 MG tablet, TAKE 1 TABLET BY MOUTH TWICE A DAY, Disp: 180 tablet, Rfl: 3 ?  Multiple Vitamin (MULTIVITAMIN) tablet, Take 1 tablet by mouth daily. , Disp: , Rfl:  ?  Probiotic Product (ALIGN PO), Take 1 tablet by mouth daily. , Disp: , Rfl:  ?  rosuvastatin (CRESTOR) 20 MG tablet, TAKE 1 TABLET BY MOUTH EVERY DAY, Disp: 90 tablet, Rfl: 3 ?  terbinafine (LAMISIL) 250 MG tablet, Take 1 tablet (250 mg total) by mouth  daily. Take daily for 12 weeks for toenail fungus, Disp: 90 tablet, Rfl: 0 ?  triamcinolone ointment (KENALOG) 0.1 %, SMARTSIG:Sparingly Topical Twice Daily, Disp: , Rfl:  ? ?PAST MEDICAL HISTORY: ?Past Medical History:  ?Diagnosis Date  ? Allergy   ? Arthritis, degenerative   ? both knees  ? B12 deficiency 07/06/2014  ? Cancer [facility])   ? Colitis   ? Common migraine with intractable migraine 07/12/2020  ? Diverticulosis   ? Duodenitis   ? Eczema   ? Fatty liver   ? Gastritis   ? GERD (gastroesophageal reflux disease)   ? Heart murmur   ? Helicobacter pylori gastritis   ? Hyperlipidemia   ?  Hypertension   ? not treated, pt reports "no"  ? IBD (inflammatory bowel disease)   ? Lymphoma ([place])   ? Memory difficulty 01/05/2013  ? Memory loss   ? mild  ? Multiple sclerosis ([place])   ? Osteopenia   ? Vitamin B12 deficiency   ? ? ?PAST SURGICAL HISTORY: ?Past Surgical History:  ?Procedure Laterality Date  ? ABDOMINAL HYSTERECTOMY  2010  ? BLADDER SURGERY  2010  ? Bladder Tack  ? CERVICAL SPINE SURGERY    ? CHOLECYSTECTOMY    ? HEMORRHOID SURGERY    ? ROTATOR CUFF REPAIR    ? SPINAL FUSION    ? TUBAL LIGATION    ? ? ?FAMILY HISTORY: ?Family History  ?Problem Relation Age of Onset  ? Colon polyps Mother   ? Heart failure Mother   ? Diabetes Father   ? Heart disease Father   ? Colon polyps Brother   ? Hypertension Brother   ? Colon cancer Maternal Grandmother   ?     ? may be duodenal cancer  ? Pancreatic cancer Maternal Uncle   ? Breast cancer Cousin   ? Esophageal cancer Neg Hx   ? Rectal cancer Neg Hx   ? Stomach cancer Neg Hx   ? ? ?SOCIAL HISTORY: ? ?Social History  ? ?Socioeconomic History  ? Marital status: Married  ?  Spouse name: Eduard Clos   ? Number of children: 4  ? Years of education: 12+  ? Highest education level: Not on file  ?Occupational History  ? Occupation: Retired   ?  Employer: [employer]   ? Occupation: part-time  ?  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  ?Tobacco Use  ? Smoking status:  Former  ?  Types: Cigarettes  ?  Quit date: 06/27/1997  ?  Years since quitting: 24.0  ? Smokeless tobacco: Never  ?Vaping Use  ? Vaping Use: Never used  ?Substance and Sexual Activity  ? Alcohol use: No  ? Drug use: No

## 2021-08-12 NOTE — Progress Notes (Signed)
?Cardiology Office Note:   ? ?Date:  08/13/2021  ? ?ID:  [name], DOB 08/30/1940, MRN [id number] ? ?PCP:  Darreld Mclean, MD  ?Cardiologist:  Shirlee More, MD   ? ?Referring MD: Darreld Mclean, MD  ? ? ?ASSESSMENT:   ? ?1. Dilated cardiomyopathy ([place])   ?2. Elevated blood pressure reading   ?3. Mixed hyperlipidemia   ? ?PLAN:   ? ?In order of problems listed above: ? ?She has mild cardiomyopathy with recovery of her ejection fraction we will plan to recheck 1 year prior to seeing me in the office at this time hold off on guideline directed medical therapy especially with her hypotension with ARB ?Stable blood pressure in range ?She will continue her high intensity statin ? ? ?Next appointment: 1 year ? ? ?Medication Adjustments/Labs and Tests Ordered: ?Current medicines are reviewed at length with the patient today.  Concerns regarding medicines are outlined above.  ?Orders Placed This Encounter  ?Procedures  ? ECHOCARDIOGRAM COMPLETE  ? ?No orders of the defined types were placed in this encounter. ? ? ?Chief Complaint  ?Patient presents with  ? Follow-up  ? Cardiomyopathy  ? ? ?History of Present Illness:   ? ?[name] is a 81 y.o. female with a hx of cardiomyopathy mildly reduced ejection fraction 45 to 50% echocardiogram 46% by gated pool study secondary to chemotherapy for non-Hodgkin's lymphoma, Crohn's disease coronary artery calcification memory loss and hyper lipidemia last seen 03/19/2021.  Her most recent ejection fraction 12/14/2020 showed EF continued low normal 50 to 55% moderate concentric LVH and mild aortic regurgitation.  At her last visit she had staged to hypertension and was placed on an ARB.  Her EKG showed a chronic pattern of right bundle branch block left anterior hemiblock. ? ?Compliance with diet, lifestyle and medications: Yes ? ?She developed symptomatic hypotension stopped her ARB ?Blood pressure remains less than 1 16-0 40 systolic ?She feels well and has had no  cardiovascular symptoms of edema shortness of breath chest pain palpitation or syncope. ?Past Medical History:  ?Diagnosis Date  ? Allergy   ? Arthritis, degenerative   ? both knees  ? B12 deficiency 07/06/2014  ? Cancer [facility])   ? Colitis   ? Common migraine with intractable migraine 07/12/2020  ? Diverticulosis   ? Duodenitis   ? Eczema   ? Fatty liver   ? Gastritis   ? GERD (gastroesophageal reflux disease)   ? Heart murmur   ? Helicobacter pylori gastritis   ? Hyperlipidemia   ? Hypertension   ? not treated, pt reports "no"  ? IBD (inflammatory bowel disease)   ? Lymphoma ([place])   ? Memory difficulty 01/05/2013  ? Memory loss   ? mild  ? Multiple sclerosis ([place])   ? Osteopenia   ? Vitamin B12 deficiency   ? ? ?Past Surgical History:  ?Procedure Laterality Date  ? ABDOMINAL HYSTERECTOMY  2010  ? BLADDER SURGERY  2010  ? Bladder Tack  ? CERVICAL SPINE SURGERY    ? CHOLECYSTECTOMY    ? HEMORRHOID SURGERY    ? ROTATOR CUFF REPAIR    ? SPINAL FUSION    ? TUBAL LIGATION    ? ? ?Current Medications: ?Current Meds  ?Medication Sig  ? b complex vitamins tablet Take 1 tablet by mouth daily.  ? Cholecalciferol (VITAMIN D3) 5000 units TABS Take 5,000 Units by mouth daily.   ? fenofibrate (TRICOR) 145 MG tablet TAKE 1 TABLET BY MOUTH  EVERY DAY  ? gabapentin (NEURONTIN) 600 MG tablet TAKE 1 TABLET BY MOUTH THREE TIMES A DAY  ? Glucosamine 500 MG TABS Take 1 tablet by mouth 2 (two) times daily.  ? loratadine (CLARITIN) 10 MG tablet Take 1 tablet (10 mg total) by mouth daily as needed for allergies.  ? memantine (NAMENDA) 10 MG tablet TAKE 1 TABLET BY MOUTH TWICE A DAY  ? Multiple Vitamin (MULTIVITAMIN) tablet Take 1 tablet by mouth daily.   ? Probiotic Product (ALIGN PO) Take 1 tablet by mouth daily.   ? rosuvastatin (CRESTOR) 20 MG tablet TAKE 1 TABLET BY MOUTH EVERY DAY  ?  ? ?Allergies:   Sulfa antibiotics, Vytorin [ezetimibe-simvastatin], Aubagio [teriflunomide], Cephalexin, Doxycycline, Erythromycin, Macrobid  [nitrofurantoin macrocrystal], Morphine, Rituxan [rituximab], Sulfonamide derivatives, and Amoxicillin  ? ?Social History  ? ?Socioeconomic History  ? Marital status: Married  ?  Spouse name: Eduard Clos   ? Number of children: 4  ? Years of education: 12+  ? Highest education level: Not on file  ?Occupational History  ? Occupation: Retired   ?  Employer: [employer]   ? Occupation: part-time  ?  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  ?Tobacco Use  ? Smoking status: Former  ?  Types: Cigarettes  ?  Quit date: 06/27/1997  ?  Years since quitting: 24.1  ?  Passive exposure: Past  ? Smokeless tobacco: Never  ?Vaping Use  ? Vaping Use: Never used  ?Substance and Sexual Activity  ? Alcohol use: No  ? Drug use: No  ? Sexual activity: Not Currently  ?Other Topics Concern  ? Not on file  ?Social History Narrative  ? Lives with husband  ? Left Handed  ? Drinks 1 cup caffeine daily  ? ?Social Determinants of Health  ? ?Financial Resource Strain: Low Risk   ? Difficulty of Paying Living Expenses: Not hard at all  ?Food Insecurity: No Food Insecurity  ? Worried About Charity fundraiser in the Last Year: Never true  ? Ran Out of Food in the Last Year: Never true  ?Transportation Needs: No Transportation Needs  ? Lack of Transportation (Medical): No  ? Lack of Transportation (Non-Medical): No  ?Physical Activity: Sufficiently Active  ? Days of Exercise per Week: 5 days  ? Minutes of Exercise per Session: 40 min  ?Stress: No Stress Concern Present  ? Feeling of Stress : Not at all  ?Social Connections: Socially Integrated  ? Frequency of Communication with Friends and Family: More than three times a week  ? Frequency of Social Gatherings with Friends and Family: More than three times a week  ? Attends Religious Services: More than 4 times per year  ? Active Member of Clubs or Organizations: Yes  ? Attends Archivist Meetings: More than 4 times per year  ? Marital Status: Married  ?  ? ?Family History: ?The  patient's family history includes Breast cancer in her cousin; Colon cancer in her maternal grandmother; Colon polyps in her brother and mother; Diabetes in her father; Heart disease in her father; Heart failure in her mother; Hypertension in her brother; Pancreatic cancer in her maternal uncle. There is no history of Esophageal cancer, Rectal cancer, or Stomach cancer. ?ROS:   ?Please see the history of present illness.    ?All other systems reviewed and are negative. ? ?EKGs/Labs/Other Studies Reviewed:   ? ?The following studies were reviewed today: ? ?Recent Labs: ?05/15/2021: ALT 18; Hemoglobin 13.1; Platelets 205.0 ?07/15/2021: BUN  18; Creatinine, Ser 0.97; Potassium 4.3; Sodium 140; TSH 2.33  ?Recent Lipid Panel ?   ?Component Value Date/Time  ? CHOL 99 01/14/2021 0907  ? TRIG 289.0 (H) 01/14/2021 8270  ? HDL 33.50 (L) 01/14/2021 7867  ? CHOLHDL 3 01/14/2021 0907  ? VLDL 57.8 (H) 01/14/2021 5449  ? LDLCALC 42 01/24/2020 1032  ? LDLDIRECT 37.0 01/14/2021 0907  ? ? ?Physical Exam:   ? ?VS:  BP 138/78 (BP Location: Left Arm, Patient Position: Sitting)   Pulse 80   Ht 5' 5.5" (1.664 m)   Wt 129 lb 1.9 oz (58.6 kg)   SpO2 98%   BMI 21.16 kg/m?    ? ?Wt Readings from Last 3 Encounters:  ?08/13/21 129 lb 1.9 oz (58.6 kg)  ?07/24/21 128 lb (58.1 kg)  ?07/15/21 128 lb 9.6 oz (58.3 kg)  ?  ? ?GEN:  Well nourished, well developed in no acute distress ?HEENT: Normal ?NECK: No JVD; No carotid bruits ?LYMPHATICS: No lymphadenopathy ?CARDIAC: RRR, no murmurs, rubs, gallops ?RESPIRATORY:  Clear to auscultation without rales, wheezing or rhonchi  ?ABDOMEN: Soft, non-tender, non-distended ?MUSCULOSKELETAL:  No edema; No deformity  ?SKIN: Warm and dry ?NEUROLOGIC:  Alert and oriented x 3 ?PSYCHIATRIC:  Normal affect  ? ? ?Signed, ?Shirlee More, MD  ?08/13/2021 5:00 PM    ?[place]  ?

## 2021-08-13 ENCOUNTER — Encounter: Payer: Self-pay | Admitting: Cardiology

## 2021-08-13 ENCOUNTER — Ambulatory Visit: Payer: Medicare HMO | Admitting: Cardiology

## 2021-08-13 VITALS — BP 138/78 | HR 80 | Ht 65.5 in | Wt 129.1 lb

## 2021-08-13 DIAGNOSIS — E782 Mixed hyperlipidemia: Secondary | ICD-10-CM | POA: Diagnosis not present

## 2021-08-13 DIAGNOSIS — R030 Elevated blood-pressure reading, without diagnosis of hypertension: Secondary | ICD-10-CM | POA: Diagnosis not present

## 2021-08-13 DIAGNOSIS — I420 Dilated cardiomyopathy: Secondary | ICD-10-CM

## 2021-08-13 NOTE — Patient Instructions (Signed)
Medication Instructions:  ?Your physician recommends that you continue on your current medications as directed. Please refer to the Current Medication list given to you today. ? ?*If you need a refill on your cardiac medications before your next appointment, please call your pharmacy* ? ? ?Lab Work: ?None ?If you have labs (blood work) drawn today and your tests are completely normal, you will receive your results only by: ?MyChart Message (if you have MyChart) OR ?A paper copy in the mail ?If you have any lab test that is abnormal or we need to change your treatment, we will call you to review the results. ? ? ?Testing/Procedures: ?Your physician has requested that you have an echocardiogram. Echocardiography is a painless test that uses sound waves to create images of your heart. It provides your doctor with information about the size and shape of your heart and how well your heart?s chambers and valves are working. This procedure takes approximately one hour. There are no restrictions for this procedure. ? ? ? ?Follow-Up: ?At [facility], you and your health needs are our priority.  As part of our continuing mission to provide you with exceptional heart care, we have created designated Provider Care Teams.  These Care Teams include your primary Cardiologist (physician) and Advanced Practice Providers (APPs -  Physician Assistants and Nurse Practitioners) who all work together to provide you with the care you need, when you need it. ? ?We recommend signing up for the patient portal called "MyChart".  Sign up information is provided on this After Visit Summary.  MyChart is used to connect with patients for Virtual Visits (Telemedicine).  Patients are able to view lab/test results, encounter notes, upcoming appointments, etc.  Non-urgent messages can be sent to your provider as well.   ?To learn more about what you can do with MyChart, go to [email or web address].   ? ?Your next appointment:   ?1 year(s) ? ?The  format for your next appointment:   ?In Person ? ?Provider:   ?Shirlee More, MD  ? ? ?Other Instructions ?None ? ?Important Information About Sugar ? ? ? ? ? ? ?

## 2021-08-20 ENCOUNTER — Telehealth: Payer: Self-pay | Admitting: Cardiology

## 2021-08-20 NOTE — Telephone Encounter (Signed)
Patient needs order for echo changed to one year from office visit. Will forward to ordering nurse. ?

## 2021-08-20 NOTE — Telephone Encounter (Signed)
Dorothy called in regards to the active request for the patient's Echo. States the patient advised her Dr. Bettina Gavia told her she needs her Echo in a year. Wanting to confirm when pt needs to be scheduled. States patient can be called back in regards to this.  ?

## 2021-08-21 ENCOUNTER — Other Ambulatory Visit: Payer: Self-pay

## 2021-09-17 DIAGNOSIS — D1801 Hemangioma of skin and subcutaneous tissue: Secondary | ICD-10-CM | POA: Diagnosis not present

## 2021-09-17 DIAGNOSIS — B078 Other viral warts: Secondary | ICD-10-CM | POA: Diagnosis not present

## 2021-09-17 DIAGNOSIS — L570 Actinic keratosis: Secondary | ICD-10-CM | POA: Diagnosis not present

## 2021-09-17 DIAGNOSIS — L821 Other seborrheic keratosis: Secondary | ICD-10-CM | POA: Diagnosis not present

## 2021-09-19 DIAGNOSIS — C8339 Diffuse large B-cell lymphoma, extranodal and solid organ sites: Secondary | ICD-10-CM | POA: Diagnosis not present

## 2021-09-26 ENCOUNTER — Ambulatory Visit (INDEPENDENT_AMBULATORY_CARE_PROVIDER_SITE_OTHER): Payer: Medicare HMO | Admitting: Family Medicine

## 2021-09-26 ENCOUNTER — Encounter: Payer: Self-pay | Admitting: Family Medicine

## 2021-09-26 VITALS — BP 126/76 | HR 98 | Temp 97.8°F | Ht 65.5 in | Wt 127.2 lb

## 2021-09-26 DIAGNOSIS — R300 Dysuria: Secondary | ICD-10-CM

## 2021-09-26 DIAGNOSIS — R3129 Other microscopic hematuria: Secondary | ICD-10-CM | POA: Diagnosis not present

## 2021-09-26 LAB — POC URINALSYSI DIPSTICK (AUTOMATED)
Bilirubin, UA: NEGATIVE
Glucose, UA: NEGATIVE
Ketones, UA: NEGATIVE
Nitrite, UA: NEGATIVE
Protein, UA: POSITIVE — AB
Spec Grav, UA: 1.01 (ref 1.010–1.025)
Urobilinogen, UA: 0.2 E.U./dL
pH, UA: 6 (ref 5.0–8.0)

## 2021-09-26 MED ORDER — CIPROFLOXACIN HCL 250 MG PO TABS: 250.0000 mg | ORAL_TABLET | Freq: Two times a day (BID) | ORAL | 0 refills | Status: AC

## 2021-09-26 NOTE — Progress Notes (Signed)
Acute Office Visit  Subjective:     Patient ID: [name], female    DOB: 03-04-1941, 81 y.o.   MRN: [id number]  CC: UTI symptoms   Dysuria  This is a new problem. The current episode started yesterday. The problem occurs every urination. The problem has been gradually worsening. The quality of the pain is described as burning and aching. The pain is moderate. There has been no fever (chills during the night). Associated symptoms include chills, frequency, hesitancy and urgency. Pertinent negatives include no discharge, flank pain, hematuria, nausea, possible pregnancy, sweats or vomiting. She has tried increased fluids for the symptoms. The treatment provided no relief.      ROS All review of systems negative except what is listed in the HPI      Objective:    BP 126/76   Pulse 98   Temp 97.8 F (36.6 C)   Ht 5' 5.5" (1.664 m)   Wt 127 lb 3.2 oz (57.7 kg)   BMI 20.85 kg/m    Physical Exam Vitals reviewed.  Constitutional:      Appearance: Normal appearance. She is normal weight.  Abdominal:     General: Abdomen is flat. There is no distension.     Palpations: Abdomen is soft. There is no mass.     Tenderness: There is no abdominal tenderness. There is no right CVA tenderness, left CVA tenderness, guarding or rebound.  Skin:    General: Skin is warm and dry.  Neurological:     General: No focal deficit present.     Mental Status: She is alert and oriented to person, place, and time. Mental status is at baseline.  Psychiatric:        Mood and Affect: Mood normal.        Behavior: Behavior normal.        Thought Content: Thought content normal.        Judgment: Judgment normal.     Results for orders placed or performed in visit on 09/26/21  POCT Urinalysis Dipstick (Automated)  Result Value Ref Range   Color, UA yellow    Clarity, UA turbid    Glucose, UA Negative Negative   Bilirubin, UA neg    Ketones, UA neg    Spec Grav, UA 1.010 1.010 - 1.025    Blood, UA large    pH, UA 6.0 5.0 - 8.0   Protein, UA Positive (A) Negative   Urobilinogen, UA 0.2 0.2 or 1.0 E.U./dL   Nitrite, UA neg    Leukocytes, UA Moderate (2+) (A) Negative        Assessment & Plan:   1. Dysuria 2. Other microscopic hematuria Urinalysis is highly suspicious for a UTI. Since the culture won't be back before the weekend, I will go ahead and send you in an antibiotic (you suggested cipro has worked well in the past since you have many antibiotic allergies). If culture results suggest any changes we will let you know. Make sure you are staying well hydrated.   There was some blood in your urine sample, so please schedule a 2-3 week lab appointment to leave another urine sample after completing antibiotics so we can make sure this is resolving.   - POCT Urinalysis Dipstick (Automated) - Urine Culture - ciprofloxacin (CIPRO) 250 MG tablet; Take 1 tablet (250 mg total) by mouth 2 (two) times daily for 3 days.  Dispense: 6 tablet; Refill: 0 - Urinalysis, Routine w reflex microscopic; Future   Return  if symptoms worsen or fail to improve.  Terrilyn Saver, NP

## 2021-09-26 NOTE — Patient Instructions (Signed)
Urinalysis is highly suspicious for a UTI. Since the culture won't be back before the weekend, I will go ahead and send you in an antibiotic (you suggested cipro has worked well in the past since you have many antibiotic allergies). If culture results suggest any changes we will let you know. Make sure you are staying well hydrated.   There was some blood in your urine sample, so please schedule a 2-3 week lab appointment to leave another urine sample after completing antibiotics so we can make sure this is resolving.

## 2021-09-28 LAB — URINE CULTURE
MICRO NUMBER:: 13530120
SPECIMEN QUALITY:: ADEQUATE

## 2021-10-04 ENCOUNTER — Other Ambulatory Visit: Payer: Self-pay | Admitting: Neurology

## 2021-10-07 ENCOUNTER — Encounter: Payer: Self-pay | Admitting: *Deleted

## 2021-10-08 ENCOUNTER — Other Ambulatory Visit (INDEPENDENT_AMBULATORY_CARE_PROVIDER_SITE_OTHER): Payer: Medicare HMO

## 2021-10-08 DIAGNOSIS — R3129 Other microscopic hematuria: Secondary | ICD-10-CM | POA: Diagnosis not present

## 2021-10-08 DIAGNOSIS — R300 Dysuria: Secondary | ICD-10-CM

## 2021-10-08 LAB — URINALYSIS, ROUTINE W REFLEX MICROSCOPIC
Bilirubin Urine: NEGATIVE
Hgb urine dipstick: NEGATIVE
Ketones, ur: NEGATIVE
Leukocytes,Ua: NEGATIVE
Nitrite: NEGATIVE
RBC / HPF: NONE SEEN (ref 0–?)
Specific Gravity, Urine: <=1.005 — AB (ref 1.000–1.030)
Total Protein, Urine: NEGATIVE
Urine Glucose: NEGATIVE
Urobilinogen, UA: 0.2 (ref 0.0–1.0)
pH: 6 (ref 5.0–8.0)

## 2021-10-18 DIAGNOSIS — R918 Other nonspecific abnormal finding of lung field: Secondary | ICD-10-CM | POA: Diagnosis not present

## 2021-10-18 DIAGNOSIS — R911 Solitary pulmonary nodule: Secondary | ICD-10-CM | POA: Diagnosis not present

## 2021-10-18 DIAGNOSIS — C8339 Diffuse large B-cell lymphoma, extranodal and solid organ sites: Secondary | ICD-10-CM | POA: Diagnosis not present

## 2021-11-04 DIAGNOSIS — G35 Multiple sclerosis: Secondary | ICD-10-CM | POA: Diagnosis not present

## 2021-11-04 DIAGNOSIS — H25811 Combined forms of age-related cataract, right eye: Secondary | ICD-10-CM | POA: Diagnosis not present

## 2021-11-04 DIAGNOSIS — H18423 Band keratopathy, bilateral: Secondary | ICD-10-CM | POA: Diagnosis not present

## 2021-11-04 DIAGNOSIS — H04123 Dry eye syndrome of bilateral lacrimal glands: Secondary | ICD-10-CM | POA: Diagnosis not present

## 2021-11-04 DIAGNOSIS — Z961 Presence of intraocular lens: Secondary | ICD-10-CM | POA: Diagnosis not present

## 2021-11-04 DIAGNOSIS — H31093 Other chorioretinal scars, bilateral: Secondary | ICD-10-CM | POA: Diagnosis not present

## 2021-11-04 DIAGNOSIS — H43811 Vitreous degeneration, right eye: Secondary | ICD-10-CM | POA: Diagnosis not present

## 2021-11-04 DIAGNOSIS — H35373 Puckering of macula, bilateral: Secondary | ICD-10-CM | POA: Diagnosis not present

## 2021-11-25 DIAGNOSIS — F4320 Adjustment disorder, unspecified: Secondary | ICD-10-CM | POA: Diagnosis not present

## 2021-11-25 DIAGNOSIS — R69 Illness, unspecified: Secondary | ICD-10-CM | POA: Diagnosis not present

## 2021-12-02 DIAGNOSIS — R69 Illness, unspecified: Secondary | ICD-10-CM | POA: Diagnosis not present

## 2021-12-02 DIAGNOSIS — F4320 Adjustment disorder, unspecified: Secondary | ICD-10-CM | POA: Diagnosis not present

## 2021-12-15 NOTE — Patient Instructions (Incomplete)
Good to see you again today- please see me in about 6 months assuming all is well Consider getting shingles vaccine at your pharmacy if not done already  Also recommend flu shot and covid booster this fall as directed Try trazodone as needed for sleep- start with a 1/2 tablet and increase to a whole tablet as needed

## 2021-12-15 NOTE — Progress Notes (Unsigned)
[facility] [address] [place], [place] [address] [phone]  Date:  12/18/2021   Name:  [name]   DOB:  November 11, 1940   MRN:  [id number]  PCP:  Vicki Mclean, MD    Chief Complaint: No chief complaint on file.   History of Present Illness:  [name] is a 81 y.o. very pleasant female patient who presents with the following:  Seen today for recheck Most recent visit with myself was in April  History of multiple sclerosis, osteopenia, Crohn's disease and diverticulitis, dilated cardiomyopathy, IBD.  She also has lymphoma which is being followed by Vicki. Talbert Holder at [facility] She does have mild cognitive impairment, taking Namenda   At our last visit [name] noted some stressors in her marriage:  She notes that her husband has started thinking she had "affairs all our married life" - they have been married for nearly 35 years and [name] states she has never been unfaithful.  She is confused about why this is happening, states that her husband keeps bringing up a boyfriend she had prior to their marriage 50 years ago   Shingirx Booster and flu shot  Can do some labs if she would like  Visit with [name] at [facility] oncology in June: Assessment:  32F with DLBCL of bilateral kidneys s/p R-CHOP and consolidation RT to bulky disease involving the L kidney in 01/2018. She is overall doing well with NED or kidney failure. Recent creatinine stable and WNL at 1.1.   Plan:  Return to clinic in 1 year for on-going surveillance alternating every 6 mo with Vicki. Mina Holder who is following labs. Her urinary symptoms are likely unrelated to her previous treatment to the kidney and she has no symptoms that indicate obstruction. She will let us know if her symptoms worsen or change and disucss with her PCP.  Cardiology visit in May with [name]: She has mild cardiomyopathy with recovery of her ejection fraction we will plan to recheck 1  year prior to seeing me in the office at this time hold off on guideline directed medical therapy especially with her hypotension with ARB Stable blood pressure in range She will continue her high intensity statin   Also seen by Neurology, [name] in April: In summary, [name] is a 81 year old woman with multiple sclerosis diagnosed in 2000 who has been off of disease modifying therapies since receiving chemotherapy for lymphoma in 2019.  Her MS is stable.  MRIs in 2019, 2021 and 2022 showed no new lesions.    We discussed that she would likely not need to go back on a disease modifying therapy as MS often becomes far less active later in life.  She also may have received some short-term benefit from reduction and short and long-term benefit from the CHOP therapy. We discussed that if the mild spasticity in the left arm worsens we could consider baclofen.  However, symptoms are likely to mild to begin at this time.  If urinary hesitancy worsens, consider tamsulosin. If migraine frequency increases we could consider a prophylactic agent.  If her current treatment regimen ceases to help we could consider an anti-CGRP agent. She will return to see [place] in 1 year or sooner if there are new or worsening neurologic symptoms.    Patient Active Problem List   Diagnosis Date Noted   Eczema 03/13/2021   Prediabetes 10/30/2020   Common migraine with intractable  migraine 07/12/2020   Vitamin B12 deficiency    Memory loss    Lymphoma ([place])    IBD (inflammatory bowel disease)    Hypertension    Hyperlipidemia    Helicobacter pylori gastritis    Heart murmur    GERD (gastroesophageal reflux disease)    Gastritis    Fatty liver    Diverticulosis    Colitis    Cancer (HCC)    Arthritis, degenerative    Allergy    New onset headache 09/26/2019   Dilated cardiomyopathy ([place]) 03/02/2019   Osteopenia 12/25/2018   Hypokalemia 02/28/2018   Sepsis ([place]) 02/27/2018   Diverticulitis 02/27/2018    Diffuse large B-cell lymphoma ([place]) 02/27/2018   Osteoarthritis of cervical spine 09/15/2016   Abnormality of gait 11/07/2015   Dizziness and giddiness 11/07/2015   Mild cognitive impairment 01/09/2015   B12 deficiency 07/06/2014   Leukopenia 05/06/2013   Memory difficulty 01/05/2013   Dyslipidemia 11/28/2008   Multiple sclerosis ([place]) 11/28/2008   INFLAMMATORY BOWEL DISEASE 11/28/2008   DIVERTICULOSIS, COLON 11/28/2008   PERSONAL HISTORY OF PEPTIC ULCER DISEASE 11/28/2008    Past Medical History:  Diagnosis Date   Allergy    Arthritis, degenerative    both knees   B12 deficiency 07/06/2014   Cancer ([place])    Colitis    Common migraine with intractable migraine 07/12/2020   Diverticulosis    Duodenitis    Eczema    Fatty liver    Gastritis    GERD (gastroesophageal reflux disease)    Heart murmur    Helicobacter pylori gastritis    Hyperlipidemia    Hypertension    not treated, pt reports "no"   IBD (inflammatory bowel disease)    Lymphoma ([place])    Memory difficulty 01/05/2013   Memory loss    mild   Multiple sclerosis ([place])    Osteopenia    Vitamin B12 deficiency     Past Surgical History:  Procedure Laterality Date   ABDOMINAL HYSTERECTOMY  2010   BLADDER SURGERY  2010   Bladder Tack   CERVICAL SPINE SURGERY     CHOLECYSTECTOMY     HEMORRHOID SURGERY     ROTATOR CUFF REPAIR     SPINAL FUSION     TUBAL LIGATION      Social History   Tobacco Use   Smoking status: Former    Types: Cigarettes    Quit date: 06/27/1997    Years since quitting: 24.4    Passive exposure: Past   Smokeless tobacco: Never  Vaping Use   Vaping Use: Never used  Substance Use Topics   Alcohol use: No   Drug use: No    Family History  Problem Relation Age of Onset   Colon polyps Mother    Heart failure Mother    Diabetes Father    Heart disease Father    Colon polyps Brother    Hypertension Brother    Colon cancer Maternal Grandmother        ? may be duodenal cancer    Pancreatic cancer Maternal Uncle    Breast cancer Cousin    Esophageal cancer Neg Hx    Rectal cancer Neg Hx    Stomach cancer Neg Hx     Allergies  Allergen Reactions   Sulfa Antibiotics Hives and Nausea And Vomiting   Vytorin [Ezetimibe-Simvastatin]     White skin patch   Aubagio [Teriflunomide]     diarrhea   Cephalexin    Doxycycline  Raw mouth    Erythromycin     REACTION: weakness   Macrobid [Nitrofurantoin Macrocrystal] Hives   Morphine Nausea And Vomiting   Rituxan [Rituximab]     Stated had issue with lungs after her last treatment   Sulfonamide Derivatives Hives and Nausea And Vomiting   Amoxicillin Hives    Medication list has been reviewed and updated.  Current Outpatient Medications on File Prior to Visit  Medication Sig Dispense Refill   b complex vitamins tablet Take 1 tablet by mouth daily.     Cholecalciferol (VITAMIN D3) 5000 units TABS Take 5,000 Units by mouth daily.      fenofibrate (TRICOR) 145 MG tablet TAKE 1 TABLET BY MOUTH EVERY DAY 90 tablet 3   gabapentin (NEURONTIN) 600 MG tablet TAKE 1 TABLET BY MOUTH THREE TIMES A DAY 270 tablet 3   Glucosamine 500 MG TABS Take 1 tablet by mouth 2 (two) times daily.     loratadine (CLARITIN) 10 MG tablet Take 1 tablet (10 mg total) by mouth daily as needed for allergies.     memantine (NAMENDA) 10 MG tablet TAKE 1 TABLET BY MOUTH TWICE A DAY 180 tablet 3   Multiple Vitamin (MULTIVITAMIN) tablet Take 1 tablet by mouth daily.      Probiotic Product (ALIGN PO) Take 1 tablet by mouth daily.      rosuvastatin (CRESTOR) 20 MG tablet TAKE 1 TABLET BY MOUTH EVERY DAY 90 tablet 3   No current facility-administered medications on file prior to visit.    Review of Systems:  As per HPI- otherwise negative.   Physical Examination: There were no vitals filed for this visit. There were no vitals filed for this visit. There is no height or weight on file to calculate BMI. Ideal Body Weight:    GEN: no acute  distress. HEENT: Atraumatic, Normocephalic.  Ears and Nose: No external deformity. CV: RRR, No M/G/R. No JVD. No thrill. No extra heart sounds. PULM: CTA B, no wheezes, crackles, rhonchi. No retractions. No resp. distress. No accessory muscle use. ABD: S, NT, ND, +BS. No rebound. No HSM. EXTR: No c/c/e PSYCH: Normally interactive. Conversant.    Assessment and Plan: ***  Signed Lamar Blinks, MD

## 2021-12-18 ENCOUNTER — Ambulatory Visit (INDEPENDENT_AMBULATORY_CARE_PROVIDER_SITE_OTHER): Payer: Medicare HMO | Admitting: Family Medicine

## 2021-12-18 ENCOUNTER — Encounter: Payer: Self-pay | Admitting: Family Medicine

## 2021-12-18 VITALS — BP 120/72 | HR 82 | Temp 97.6°F | Resp 18 | Ht 65.5 in | Wt 123.2 lb

## 2021-12-18 DIAGNOSIS — E785 Hyperlipidemia, unspecified: Secondary | ICD-10-CM

## 2021-12-18 DIAGNOSIS — E538 Deficiency of other specified B group vitamins: Secondary | ICD-10-CM

## 2021-12-18 DIAGNOSIS — R69 Illness, unspecified: Secondary | ICD-10-CM | POA: Diagnosis not present

## 2021-12-18 DIAGNOSIS — F5102 Adjustment insomnia: Secondary | ICD-10-CM | POA: Diagnosis not present

## 2021-12-18 DIAGNOSIS — N1831 Chronic kidney disease, stage 3a: Secondary | ICD-10-CM

## 2021-12-18 DIAGNOSIS — C8599 Non-Hodgkin lymphoma, unspecified, extranodal and solid organ sites: Secondary | ICD-10-CM | POA: Diagnosis not present

## 2021-12-18 DIAGNOSIS — E559 Vitamin D deficiency, unspecified: Secondary | ICD-10-CM

## 2021-12-18 DIAGNOSIS — G35 Multiple sclerosis: Secondary | ICD-10-CM | POA: Diagnosis not present

## 2021-12-18 DIAGNOSIS — R7303 Prediabetes: Secondary | ICD-10-CM | POA: Diagnosis not present

## 2021-12-18 LAB — COMPREHENSIVE METABOLIC PANEL
ALT: 14 U/L (ref 0–35)
AST: 29 U/L (ref 0–37)
Albumin: 4 g/dL (ref 3.5–5.2)
Alkaline Phosphatase: 50 U/L (ref 39–117)
BUN: 21 mg/dL (ref 6–23)
CO2: 30 mEq/L (ref 19–32)
Calcium: 9.5 mg/dL (ref 8.4–10.5)
Chloride: 102 mEq/L (ref 96–112)
Creatinine, Ser: 1.06 mg/dL (ref 0.40–1.20)
GFR: 49.75 mL/min — ABNORMAL LOW (ref >60.00)
Glucose, Bld: 108 mg/dL — ABNORMAL HIGH (ref 70–99)
Potassium: 4 mEq/L (ref 3.5–5.1)
Sodium: 139 mEq/L (ref 135–145)
Total Bilirubin: 0.6 mg/dL (ref 0.2–1.2)
Total Protein: 7 g/dL (ref 6.0–8.3)

## 2021-12-18 LAB — CBC
HCT: 38.4 % (ref 36.0–46.0)
Hemoglobin: 12.9 g/dL (ref 12.0–15.0)
MCHC: 33.6 g/dL (ref 30.0–36.0)
MCV: 95.9 fl (ref 78.0–100.0)
Platelets: 214 10*3/uL (ref 150.0–400.0)
RBC: 4 Mil/uL (ref 3.87–5.11)
RDW: 13.9 % (ref 11.5–15.5)
WBC: 2.9 10*3/uL — ABNORMAL LOW (ref 4.0–10.5)

## 2021-12-18 LAB — LIPID PANEL
Cholesterol: 84 mg/dL (ref 0–200)
HDL: 31 mg/dL — ABNORMAL LOW (ref >39.00)
NonHDL: 53.21
Total CHOL/HDL Ratio: 3
Triglycerides: 210 mg/dL — ABNORMAL HIGH (ref 0.0–149.0)
VLDL: 42 mg/dL — ABNORMAL HIGH (ref 0.0–40.0)

## 2021-12-18 LAB — TSH: TSH: 1.01 u[IU]/mL (ref 0.35–5.50)

## 2021-12-18 LAB — LDL CHOLESTEROL, DIRECT: Direct LDL: 29 mg/dL

## 2021-12-18 MED ORDER — TRAZODONE HCL 50 MG PO TABS: 25.0000 mg | ORAL_TABLET | Freq: Every evening | ORAL | 3 refills | Status: DC | PRN

## 2021-12-21 ENCOUNTER — Other Ambulatory Visit: Payer: Self-pay | Admitting: Family Medicine

## 2021-12-21 DIAGNOSIS — E785 Hyperlipidemia, unspecified: Secondary | ICD-10-CM

## 2021-12-29 ENCOUNTER — Other Ambulatory Visit: Payer: Self-pay | Admitting: Family Medicine

## 2021-12-29 DIAGNOSIS — E785 Hyperlipidemia, unspecified: Secondary | ICD-10-CM

## 2022-01-09 ENCOUNTER — Other Ambulatory Visit: Payer: Self-pay | Admitting: Family Medicine

## 2022-01-09 DIAGNOSIS — F5102 Adjustment insomnia: Secondary | ICD-10-CM

## 2022-01-23 DIAGNOSIS — M21372 Foot drop, left foot: Secondary | ICD-10-CM | POA: Diagnosis not present

## 2022-01-23 DIAGNOSIS — Z9049 Acquired absence of other specified parts of digestive tract: Secondary | ICD-10-CM | POA: Diagnosis not present

## 2022-01-23 DIAGNOSIS — Z79899 Other long term (current) drug therapy: Secondary | ICD-10-CM | POA: Diagnosis not present

## 2022-01-23 DIAGNOSIS — Z87891 Personal history of nicotine dependence: Secondary | ICD-10-CM | POA: Diagnosis not present

## 2022-01-23 DIAGNOSIS — K5090 Crohn's disease, unspecified, without complications: Secondary | ICD-10-CM | POA: Diagnosis not present

## 2022-01-23 DIAGNOSIS — C8339 Diffuse large B-cell lymphoma, extranodal and solid organ sites: Secondary | ICD-10-CM | POA: Diagnosis not present

## 2022-01-23 DIAGNOSIS — R21 Rash and other nonspecific skin eruption: Secondary | ICD-10-CM | POA: Diagnosis not present

## 2022-01-23 DIAGNOSIS — G629 Polyneuropathy, unspecified: Secondary | ICD-10-CM | POA: Diagnosis not present

## 2022-01-23 DIAGNOSIS — Z23 Encounter for immunization: Secondary | ICD-10-CM | POA: Diagnosis not present

## 2022-01-23 DIAGNOSIS — R911 Solitary pulmonary nodule: Secondary | ICD-10-CM | POA: Diagnosis not present

## 2022-02-14 DIAGNOSIS — H5211 Myopia, right eye: Secondary | ICD-10-CM | POA: Diagnosis not present

## 2022-02-17 ENCOUNTER — Other Ambulatory Visit: Payer: Self-pay | Admitting: Neurology

## 2022-03-08 NOTE — Progress Notes (Deleted)
[place] at [facility] [address] [place], [place] [address] [phone]  Date:  03/12/2022   Name:  [name]   DOB:  1940-12-30   MRN:  [id number]  PCP:  Darreld Mclean, MD    Chief Complaint: No chief complaint on file.   History of Present Illness:  [name] is a 81 y.o. very pleasant female patient who presents with the following:  Patient seen today with concern of a breast issue Most recent visit with myself was in September of this year History of multiple sclerosis, osteopenia, Crohn's disease and diverticulitis, dilated cardiomyopathy, IBD.  She also has lymphoma which is being followed by Dr. Talbert Cage at [facility] She does have mild cognitive impairment, taking Namenda   Per notes in September, the patient has been separated from her husband She was having difficulty sleeping, I prescribed trazodone to use as needed Lab work was done in September Most recent mammogram in March of this year-normal Patient Active Problem List   Diagnosis Date Noted   Eczema 03/13/2021   Prediabetes 10/30/2020   Common migraine with intractable migraine 07/12/2020   Vitamin B12 deficiency    Memory loss    Lymphoma ([place])    IBD (inflammatory bowel disease)    Hypertension    Hyperlipidemia    Helicobacter pylori gastritis    Heart murmur    GERD (gastroesophageal reflux disease)    Gastritis    Fatty liver    Diverticulosis    Colitis    Cancer ([employer])    Arthritis, degenerative    Allergy    New onset headache 09/26/2019   Dilated cardiomyopathy ([place]) 03/02/2019   Osteopenia 12/25/2018   Hypokalemia 02/28/2018   Sepsis ([place]) 02/27/2018   Diverticulitis 02/27/2018   Diffuse large B-cell lymphoma ([place]) 02/27/2018   Osteoarthritis of cervical spine 09/15/2016   Abnormality of gait 11/07/2015   Dizziness and giddiness 11/07/2015   Mild cognitive impairment 01/09/2015   B12 deficiency 07/06/2014   Leukopenia  05/06/2013   Memory difficulty 01/05/2013   Dyslipidemia 11/28/2008   Multiple sclerosis ([place]) 11/28/2008   INFLAMMATORY BOWEL DISEASE 11/28/2008   DIVERTICULOSIS, COLON 11/28/2008   PERSONAL HISTORY OF PEPTIC ULCER DISEASE 11/28/2008    Past Medical History:  Diagnosis Date   Allergy    Arthritis, degenerative    both knees   B12 deficiency 07/06/2014   Cancer ([place])    Colitis    Common migraine with intractable migraine 07/12/2020   Diverticulosis    Duodenitis    Eczema    Fatty liver    Gastritis    GERD (gastroesophageal reflux disease)    Heart murmur    Helicobacter pylori gastritis    Hyperlipidemia    Hypertension    not treated, pt reports "no"   IBD (inflammatory bowel disease)    Lymphoma ([place])    Memory difficulty 01/05/2013   Memory loss    mild   Multiple sclerosis ([place])    Osteopenia    Vitamin B12 deficiency     Past Surgical History:  Procedure Laterality Date   ABDOMINAL HYSTERECTOMY  2010   BLADDER SURGERY  2010   Bladder Tack   CERVICAL SPINE SURGERY     CHOLECYSTECTOMY     HEMORRHOID SURGERY     ROTATOR CUFF REPAIR     SPINAL FUSION     TUBAL LIGATION      Social History   Tobacco  Use   Smoking status: Former    Types: Cigarettes    Quit date: 06/27/1997    Years since quitting: 24.7    Passive exposure: Past   Smokeless tobacco: Never  Vaping Use   Vaping Use: Never used  Substance Use Topics   Alcohol use: No   Drug use: No    Family History  Problem Relation Age of Onset   Colon polyps Mother    Heart failure Mother    Diabetes Father    Heart disease Father    Colon polyps Brother    Hypertension Brother    Colon cancer Maternal Grandmother        ? may be duodenal cancer   Pancreatic cancer Maternal Uncle    Breast cancer Cousin    Esophageal cancer Neg Hx    Rectal cancer Neg Hx    Stomach cancer Neg Hx     Allergies  Allergen Reactions   Sulfa Antibiotics Hives and Nausea And Vomiting   Vytorin  [Ezetimibe-Simvastatin]     White skin patch   Aubagio [Teriflunomide]     diarrhea   Cephalexin    Doxycycline     Raw mouth    Erythromycin     REACTION: weakness   Macrobid [Nitrofurantoin Macrocrystal] Hives   Morphine Nausea And Vomiting   Rituxan [Rituximab]     Stated had issue with lungs after her last treatment   Sulfonamide Derivatives Hives and Nausea And Vomiting   Amoxicillin Hives    Medication list has been reviewed and updated.  Current Outpatient Medications on File Prior to Visit  Medication Sig Dispense Refill   b complex vitamins tablet Take 1 tablet by mouth daily.     cetirizine (ZYRTEC) 10 MG chewable tablet Chew 10 mg by mouth daily.     Cholecalciferol (VITAMIN D3) 5000 units TABS Take 5,000 Units by mouth daily.      fenofibrate (TRICOR) 145 MG tablet Take 1 tablet (145 mg total) by mouth daily. 90 tablet 2   gabapentin (NEURONTIN) 600 MG tablet TAKE 1 TABLET BY MOUTH THREE TIMES A DAY 270 tablet 3   Glucosamine 500 MG TABS Take 1 tablet by mouth 2 (two) times daily.     memantine (NAMENDA) 10 MG tablet TAKE 1 TABLET BY MOUTH TWICE A DAY 180 tablet 3   Multiple Vitamin (MULTIVITAMIN) tablet Take 1 tablet by mouth daily.      rosuvastatin (CRESTOR) 20 MG tablet Take 1 tablet (20 mg total) by mouth daily. 90 tablet 1   traZODone (DESYREL) 50 MG tablet Take 0.5-1 tablets (25-50 mg total) by mouth at bedtime as needed for sleep. 90 tablet 0   No current facility-administered medications on file prior to visit.    Review of Systems:  As per HPI- otherwise negative.   Physical Examination: There were no vitals filed for this visit. There were no vitals filed for this visit. There is no height or weight on file to calculate BMI. Ideal Body Weight:    GEN: no acute distress. HEENT: Atraumatic, Normocephalic.  Ears and Nose: No external deformity. CV: RRR, No M/G/R. No JVD. No thrill. No extra heart sounds. PULM: CTA B, no wheezes, crackles, rhonchi.  No retractions. No resp. distress. No accessory muscle use. ABD: S, NT, ND, +BS. No rebound. No HSM. EXTR: No c/c/e PSYCH: Normally interactive. Conversant.    Assessment and Plan: ***  Signed Lamar Blinks, MD

## 2022-03-08 NOTE — Patient Instructions (Incomplete)
It was good to see you again today!   

## 2022-03-12 ENCOUNTER — Ambulatory Visit: Payer: Medicare HMO | Admitting: Family Medicine

## 2022-03-12 ENCOUNTER — Ambulatory Visit (INDEPENDENT_AMBULATORY_CARE_PROVIDER_SITE_OTHER): Payer: Medicare HMO | Admitting: Family Medicine

## 2022-03-12 VITALS — BP 122/70 | HR 100 | Temp 97.8°F | Resp 18 | Ht 65.5 in | Wt 123.4 lb

## 2022-03-12 DIAGNOSIS — R1032 Left lower quadrant pain: Secondary | ICD-10-CM | POA: Diagnosis not present

## 2022-03-12 MED ORDER — METRONIDAZOLE 500 MG PO TABS: 500.0000 mg | ORAL_TABLET | Freq: Two times a day (BID) | ORAL | 0 refills | Status: DC

## 2022-03-12 MED ORDER — CIPROFLOXACIN HCL 500 MG PO TABS: 500.0000 mg | ORAL_TABLET | Freq: Two times a day (BID) | ORAL | 0 refills | Status: AC

## 2022-03-12 NOTE — Patient Instructions (Addendum)
It was good to see you again today- I hope you are feeling better soon Rx for cipro and flagyl antibiotics sent to your pharmacy Continue a soft, easy to digest diet and plenty of liquids If you are not continuing to improve we will get a CT scan

## 2022-03-12 NOTE — Progress Notes (Addendum)
[place] at [facility] [address] [place], [place] [address] [phone]  Date:  03/12/2022   Name:  [name]   DOB:  1941-02-12   MRN:  [id number]  PCP:  Darreld Mclean, MD    Chief Complaint: left side pain ([place] region x Several days. Pt believes this is related to crohn's or diverticulitis/Concerns/ questions: pt says sometimes she feels her heart beating heavier than other times./AWV due)   History of Present Illness:  [name] is a 81 y.o. very pleasant female patient who presents with the following:  Patient seen today with concern of pain on her left lower abdomen Most recent visit with myself was in September for routine follow-up History of multiple sclerosis, osteopenia, Crohn's disease and diverticulitis, dilated cardiomyopathy, IBD.  She also has lymphoma which is being followed by Dr. Talbert Cage at [facility] She does have mild cognitive impairment, taking Namenda  At her last visit, there is also concern that her husband of many years check his started accusing her of multiple affairs and had moved out-patient reports unfortunately this is still the case  She was seen by hematology at [facility] last month-they updated her flu and COVID vaccines  She has noted a pain in her left lower abd and suprpubic region for about 4 days- she suspected diverticulitis, changed over to a soft diet yesterday and this did help No vomiting or fever She is not having diarrhea  She is not quite sure when she last had a diverticulitis flare- no serious attack since 2019 No blood in her stool Patient Active Problem List   Diagnosis Date Noted   Eczema 03/13/2021   Prediabetes 10/30/2020   Common migraine with intractable migraine 07/12/2020   Vitamin B12 deficiency    Memory loss    Lymphoma ([place])    IBD (inflammatory bowel disease)    Hypertension    Hyperlipidemia    Helicobacter pylori gastritis    Heart murmur     GERD (gastroesophageal reflux disease)    Gastritis    Fatty liver    Diverticulosis    Colitis    Cancer ([place])    Arthritis, degenerative    Allergy    New onset headache 09/26/2019   Dilated cardiomyopathy ([place]) 03/02/2019   Osteopenia 12/25/2018   Hypokalemia 02/28/2018   Sepsis ([place]) 02/27/2018   Diverticulitis 02/27/2018   Diffuse large B-cell lymphoma ([place]) 02/27/2018   Osteoarthritis of cervical spine 09/15/2016   Abnormality of gait 11/07/2015   Dizziness and giddiness 11/07/2015   Mild cognitive impairment 01/09/2015   B12 deficiency 07/06/2014   Leukopenia 05/06/2013   Memory difficulty 01/05/2013   Dyslipidemia 11/28/2008   Multiple sclerosis ([place]) 11/28/2008   INFLAMMATORY BOWEL DISEASE 11/28/2008   DIVERTICULOSIS, COLON 11/28/2008   PERSONAL HISTORY OF PEPTIC ULCER DISEASE 11/28/2008    Past Medical History:  Diagnosis Date   Allergy    Arthritis, degenerative    both knees   B12 deficiency 07/06/2014   Cancer ([place])    Colitis    Common migraine with intractable migraine 07/12/2020   Diverticulosis    Duodenitis    Eczema    Fatty liver    Gastritis    GERD (gastroesophageal reflux disease)    Heart murmur    Helicobacter pylori gastritis    Hyperlipidemia    Hypertension    not treated, pt reports "no"   IBD (inflammatory bowel disease)  Lymphoma ([place])    Memory difficulty 01/05/2013   Memory loss    mild   Multiple sclerosis ([place])    Osteopenia    Vitamin B12 deficiency     Past Surgical History:  Procedure Laterality Date   ABDOMINAL HYSTERECTOMY  2010   BLADDER SURGERY  2010   Bladder Tack   CERVICAL SPINE SURGERY     CHOLECYSTECTOMY     HEMORRHOID SURGERY     ROTATOR CUFF REPAIR     SPINAL FUSION     TUBAL LIGATION      Social History   Tobacco Use   Smoking status: Former    Types: Cigarettes    Quit date: 06/27/1997    Years since quitting: 24.7    Passive exposure: Past   Smokeless tobacco: Never  Vaping Use    Vaping Use: Never used  Substance Use Topics   Alcohol use: No   Drug use: No    Family History  Problem Relation Age of Onset   Colon polyps Mother    Heart failure Mother    Diabetes Father    Heart disease Father    Colon polyps Brother    Hypertension Brother    Colon cancer Maternal Grandmother        ? may be duodenal cancer   Pancreatic cancer Maternal Uncle    Breast cancer Cousin    Esophageal cancer Neg Hx    Rectal cancer Neg Hx    Stomach cancer Neg Hx     Allergies  Allergen Reactions   Sulfa Antibiotics Hives and Nausea And Vomiting   Vytorin [Ezetimibe-Simvastatin]     White skin patch   Aubagio [Teriflunomide]     diarrhea   Cephalexin    Doxycycline     Raw mouth    Erythromycin     REACTION: weakness   Macrobid [Nitrofurantoin Macrocrystal] Hives   Morphine Nausea And Vomiting   Rituxan [Rituximab]     Stated had issue with lungs after her last treatment   Sulfonamide Derivatives Hives and Nausea And Vomiting   Amoxicillin Hives    Medication list has been reviewed and updated.  Current Outpatient Medications on File Prior to Visit  Medication Sig Dispense Refill   b complex vitamins tablet Take 1 tablet by mouth daily.     cetirizine (ZYRTEC) 10 MG chewable tablet Chew 10 mg by mouth daily.     Cholecalciferol (VITAMIN D3) 5000 units TABS Take 5,000 Units by mouth daily.      fenofibrate (TRICOR) 145 MG tablet Take 1 tablet (145 mg total) by mouth daily. 90 tablet 2   gabapentin (NEURONTIN) 600 MG tablet TAKE 1 TABLET BY MOUTH THREE TIMES A DAY 270 tablet 3   Glucosamine 500 MG TABS Take 1 tablet by mouth 2 (two) times daily.     memantine (NAMENDA) 10 MG tablet TAKE 1 TABLET BY MOUTH TWICE A DAY 180 tablet 3   Multiple Vitamin (MULTIVITAMIN) tablet Take 1 tablet by mouth daily.      rosuvastatin (CRESTOR) 20 MG tablet Take 1 tablet (20 mg total) by mouth daily. 90 tablet 1   traZODone (DESYREL) 50 MG tablet Take 0.5-1 tablets (25-50 mg  total) by mouth at bedtime as needed for sleep. 90 tablet 0   No current facility-administered medications on file prior to visit.    Review of Systems:  As per HPI- otherwise negative. Pulse Readings from Last 3 Encounters:  03/12/22 100  12/18/21 82  09/26/21  98      Physical Examination: Vitals:   03/12/22 1408  BP: 122/70  Pulse: 100  Resp: 18  Temp: 97.8 F (36.6 C)  SpO2: 97%   Vitals:   03/12/22 1408  Weight: 123 lb 6.4 oz (56 kg)  Height: 5' 5.5" (1.664 m)   Body mass index is 20.22 kg/m. Ideal Body Weight: Weight in (lb) to have BMI = 25: 152.2  GEN: no acute distress.  Slim build, looks well HEENT: Atraumatic, Normocephalic.  Ears and Nose: No external deformity. CV: RRR, No M/G/R. No JVD. No thrill. No extra heart sounds. PULM: CTA B, no wheezes, crackles, rhonchi. No retractions. No resp. distress. No accessory muscle use. ABD: S, ND, +BS. No rebound. No HSM.  Mild left lower quadrant tenderness is present EXTR: No c/c/e PSYCH: Normally interactive. Conversant.    Assessment and Plan: LLQ pain - Plan: CBC, Comprehensive metabolic panel, Urine Culture, ciprofloxacin (CIPRO) 500 MG tablet, metroNIDAZOLE (FLAGYL) 500 MG tablet, CANCELED: POCT urinalysis dipstick  Patient seen today with left lower quadrant pain.  We collected a urine sample but there was not enough for urinalysis, only culture. She has known history of diverticulitis, the symptoms are suggested to her.  She is allergic to penicillins As such, we will start her on Cipro and Flagyl twice daily for 10 days.  Encouraged her to continue a soft and bland diet if she feels hungry.  She has had some failure to thrive due to her cancer, so will not insist on liquids only. She is asked to please let me know if not seeing continued improvement, in that case we can proceed to CT scan  Signed Lamar Blinks, MD  Addendum 11/30, received lab as below.  Message to patient Results for orders placed  or performed in visit on 03/12/22  Urine Culture   Specimen: Urine  Result Value Ref Range   MICRO NUMBER: [id number]    SPECIMEN QUALITY: Adequate    Sample Source URINE    STATUS: FINAL    Result: No Growth   CBC  Result Value Ref Range   WBC 3.7 (L) 4.0 - 10.5 K/uL   RBC 3.98 3.87 - 5.11 Mil/uL   Platelets 200.0 150.0 - 400.0 K/uL   Hemoglobin 12.7 12.0 - 15.0 g/dL   HCT 37.9 36.0 - 46.0 %   MCV 95.2 78.0 - 100.0 fl   MCHC 33.5 30.0 - 36.0 g/dL   RDW 13.3 11.5 - 15.5 %  Comprehensive metabolic panel  Result Value Ref Range   Sodium 140 135 - 145 mEq/L   Potassium 3.7 3.5 - 5.1 mEq/L   Chloride 101 96 - 112 mEq/L   CO2 30 19 - 32 mEq/L   Glucose, Bld 119 (H) 70 - 99 mg/dL   BUN 23 6 - 23 mg/dL   Creatinine, Ser 0.97 0.40 - 1.20 mg/dL   Total Bilirubin 0.6 0.2 - 1.2 mg/dL   Alkaline Phosphatase 53 39 - 117 U/L   AST 25 0 - 37 U/L   ALT 13 0 - 35 U/L   Total Protein 7.1 6.0 - 8.3 g/dL   Albumin 4.3 3.5 - 5.2 g/dL   GFR 55.24 (L) >60.00 mL/min   Calcium 9.9 8.4 - 10.5 mg/dL   12/1, received her urine culture, message to patient

## 2022-03-13 ENCOUNTER — Encounter: Payer: Self-pay | Admitting: Family Medicine

## 2022-03-13 LAB — COMPREHENSIVE METABOLIC PANEL
ALT: 13 U/L (ref 0–35)
AST: 25 U/L (ref 0–37)
Albumin: 4.3 g/dL (ref 3.5–5.2)
Alkaline Phosphatase: 53 U/L (ref 39–117)
BUN: 23 mg/dL (ref 6–23)
CO2: 30 mEq/L (ref 19–32)
Calcium: 9.9 mg/dL (ref 8.4–10.5)
Chloride: 101 mEq/L (ref 96–112)
Creatinine, Ser: 0.97 mg/dL (ref 0.40–1.20)
GFR: 55.24 mL/min — ABNORMAL LOW (ref >60.00)
Glucose, Bld: 119 mg/dL — ABNORMAL HIGH (ref 70–99)
Potassium: 3.7 mEq/L (ref 3.5–5.1)
Sodium: 140 mEq/L (ref 135–145)
Total Bilirubin: 0.6 mg/dL (ref 0.2–1.2)
Total Protein: 7.1 g/dL (ref 6.0–8.3)

## 2022-03-13 LAB — CBC
HCT: 37.9 % (ref 36.0–46.0)
Hemoglobin: 12.7 g/dL (ref 12.0–15.0)
MCHC: 33.5 g/dL (ref 30.0–36.0)
MCV: 95.2 fl (ref 78.0–100.0)
Platelets: 200 10*3/uL (ref 150.0–400.0)
RBC: 3.98 Mil/uL (ref 3.87–5.11)
RDW: 13.3 % (ref 11.5–15.5)
WBC: 3.7 10*3/uL — ABNORMAL LOW (ref 4.0–10.5)

## 2022-03-14 LAB — URINE CULTURE
MICRO NUMBER:: 14246106
Result:: NO GROWTH
SPECIMEN QUALITY:: ADEQUATE

## 2022-04-08 ENCOUNTER — Other Ambulatory Visit: Payer: Self-pay | Admitting: Family Medicine

## 2022-04-08 DIAGNOSIS — F5102 Adjustment insomnia: Secondary | ICD-10-CM

## 2022-04-09 ENCOUNTER — Ambulatory Visit (INDEPENDENT_AMBULATORY_CARE_PROVIDER_SITE_OTHER): Payer: Medicare HMO | Admitting: Family Medicine

## 2022-04-09 ENCOUNTER — Encounter: Payer: Self-pay | Admitting: Family Medicine

## 2022-04-09 DIAGNOSIS — R1032 Left lower quadrant pain: Secondary | ICD-10-CM

## 2022-04-09 LAB — POCT URINALYSIS DIPSTICK
Bilirubin, UA: NEGATIVE
Blood, UA: NEGATIVE
Glucose, UA: NEGATIVE
Ketones, UA: NEGATIVE
Leukocytes, UA: NEGATIVE
Nitrite, UA: NEGATIVE
Protein, UA: NEGATIVE
Spec Grav, UA: <=1.005 — AB (ref 1.010–1.025)
Urobilinogen, UA: 0.2 E.U./dL
pH, UA: 6.5 (ref 5.0–8.0)

## 2022-04-09 MED ORDER — METRONIDAZOLE 500 MG PO TABS: 500.0000 mg | ORAL_TABLET | Freq: Two times a day (BID) | ORAL | 0 refills | Status: DC

## 2022-04-09 MED ORDER — CIPROFLOXACIN HCL 500 MG PO TABS: 500.0000 mg | ORAL_TABLET | Freq: Two times a day (BID) | ORAL | 0 refills | Status: AC

## 2022-04-09 NOTE — Progress Notes (Signed)
Acute Office Visit  Subjective:     Patient ID: [name], female    DOB: 06-21-1940, 81 y.o.   MRN: [id number]  Chief Complaint  Patient presents with   Diverticulitis    HPI Patient is in today for LLQ pain, concern for diverticulitis.  Patient reports that last night she started feeling poorly and it interfered with her sleep. States she was having abdominal discomfort, primarily LLQ. Reports history of diverticulitis and hospitalization at one point so she likes to be seen early to prevent worsening illness. States that she is feeling some improvement today after limiting herself to a bland diet. Reports she is staying well hydrated. Last BM was today and was average, maybe not quite as loose as normal (Crohns hx). LLQ pain is currently 5/10. She denies any fever, chills, diarrhea, nausea, vomiting, dysuria, hematuria, frequency, urgency. Reports last flare was about a  month ago and responded well to 7 days of cipro and flagyl.    ROS All review of systems negative except what is listed in the HPI      Objective:    BP (!) 146/89   Pulse 98   Temp 98.9 F (37.2 C)   Resp 16   Ht 5' 5"  (1.651 m)   Wt 124 lb (56.2 kg)   SpO2 99%   BMI 20.63 kg/m    Physical Exam Vitals reviewed.  Constitutional:      Appearance: Normal appearance.  Neck:     Vascular: No carotid bruit.  Cardiovascular:     Rate and Rhythm: Normal rate and regular rhythm.     Pulses: Normal pulses.     Heart sounds: Normal heart sounds.  Pulmonary:     Effort: Pulmonary effort is normal.     Breath sounds: Normal breath sounds.  Abdominal:     General: There is no distension.     Palpations: There is no mass.     Tenderness: There is abdominal tenderness. There is no right CVA tenderness, left CVA tenderness, guarding or rebound.     Hernia: No hernia is present.     Comments: Mild LLQ tenderness to palpation   Skin:    General: Skin is warm and dry.  Neurological:     Mental  Status: She is alert and oriented to person, place, and time.  Psychiatric:        Mood and Affect: Mood normal.        Behavior: Behavior normal.        Thought Content: Thought content normal.        Judgment: Judgment normal.        Results for orders placed or performed in visit on 04/09/22  POC Urinalysis Dipstick  Result Value Ref Range   Color, UA     Clarity, UA     Glucose, UA Negative Negative   Bilirubin, UA Negative    Ketones, UA Negative    Spec Grav, UA <=1.005 (A) 1.010 - 1.025   Blood, UA Negative    pH, UA 6.5 5.0 - 8.0   Protein, UA Negative Negative   Urobilinogen, UA 0.2 0.2 or 1.0 E.U./dL   Nitrite, UA Negative    Leukocytes, UA Negative Negative   Appearance     Odor          Assessment & Plan:   Problem List Items Addressed This Visit   None Visit Diagnoses     LLQ pain   Urine looks normal today.  We discussed options including CT scan vs conservative management. You deferred CT scan at this time, but agreed to monitor your symptoms closely.  Sending in antibiotics for you, but we discussed new recommendations that antibiotics may not be necessary for simple diverticulitis. Encouraged bland diet, liquids preferred for the next day or two, but will send antibiotics in case symptoms fail to improve. Additional information added to handout.  Discussed risk of c.diff with these abx and encouraged daily probiotic.   Please contact office for follow-up if symptoms do not improve or worsen. Seek emergency care if symptoms become severe.      Relevant Medications   ciprofloxacin (CIPRO) 500 MG tablet   metroNIDAZOLE (FLAGYL) 500 MG tablet   Other Relevant Orders   POC Urinalysis Dipstick (Completed)       Meds ordered this encounter  Medications   ciprofloxacin (CIPRO) 500 MG tablet    Sig: Take 1 tablet (500 mg total) by mouth 2 (two) times daily for 10 days.    Dispense:  20 tablet    Refill:  0    Order Specific Question:   Supervising  Provider    Answer:   Penni Homans A [[id number]]   metroNIDAZOLE (FLAGYL) 500 MG tablet    Sig: Take 1 tablet (500 mg total) by mouth 2 (two) times daily. Take 1 pill twice daily for one week. NO alcohol    Dispense:  20 tablet    Refill:  0    Order Specific Question:   Supervising Provider    Answer:   Penni Homans A [[id number]]    Return if symptoms worsen or fail to improve.  Terrilyn Saver, NP

## 2022-04-09 NOTE — Patient Instructions (Signed)
Urine looks normal today. We discussed options including CT scan vs conservative management. You deferred CT scan at this time, but agreed to monitor your symptoms closely.  Sending in antibiotics for you, but we discussed new recommendations that antibiotics may not be necessary for simple diverticulitis. Encouraged bland diet, liquids preferred for the next day or two, but will send antibiotics in case symptoms fail to improve. Additional information added to handout.   Please contact office for follow-up if symptoms do not improve or worsen. Seek emergency care if symptoms become severe.

## 2022-04-25 ENCOUNTER — Other Ambulatory Visit: Payer: Self-pay | Admitting: Cardiology

## 2022-04-25 NOTE — Telephone Encounter (Signed)
Refill request for Valsartan denied , per chart patient is no longer on this medication.

## 2022-05-13 DIAGNOSIS — H25011 Cortical age-related cataract, right eye: Secondary | ICD-10-CM | POA: Diagnosis not present

## 2022-05-13 DIAGNOSIS — Z961 Presence of intraocular lens: Secondary | ICD-10-CM | POA: Diagnosis not present

## 2022-05-13 DIAGNOSIS — H18413 Arcus senilis, bilateral: Secondary | ICD-10-CM | POA: Diagnosis not present

## 2022-05-13 DIAGNOSIS — H2511 Age-related nuclear cataract, right eye: Secondary | ICD-10-CM | POA: Diagnosis not present

## 2022-05-22 DIAGNOSIS — R69 Illness, unspecified: Secondary | ICD-10-CM | POA: Diagnosis not present

## 2022-05-22 DIAGNOSIS — F4320 Adjustment disorder, unspecified: Secondary | ICD-10-CM | POA: Diagnosis not present

## 2022-05-30 DIAGNOSIS — H2511 Age-related nuclear cataract, right eye: Secondary | ICD-10-CM | POA: Diagnosis not present

## 2022-05-30 DIAGNOSIS — Z9841 Cataract extraction status, right eye: Secondary | ICD-10-CM | POA: Diagnosis not present

## 2022-05-30 DIAGNOSIS — Z9842 Cataract extraction status, left eye: Secondary | ICD-10-CM | POA: Diagnosis not present

## 2022-05-30 DIAGNOSIS — H52223 Regular astigmatism, bilateral: Secondary | ICD-10-CM | POA: Diagnosis not present

## 2022-05-30 DIAGNOSIS — H5202 Hypermetropia, left eye: Secondary | ICD-10-CM | POA: Diagnosis not present

## 2022-05-30 DIAGNOSIS — Z961 Presence of intraocular lens: Secondary | ICD-10-CM | POA: Diagnosis not present

## 2022-05-30 DIAGNOSIS — H524 Presbyopia: Secondary | ICD-10-CM | POA: Diagnosis not present

## 2022-06-02 DIAGNOSIS — H2511 Age-related nuclear cataract, right eye: Secondary | ICD-10-CM | POA: Diagnosis not present

## 2022-06-09 DIAGNOSIS — H524 Presbyopia: Secondary | ICD-10-CM | POA: Diagnosis not present

## 2022-06-09 DIAGNOSIS — H2511 Age-related nuclear cataract, right eye: Secondary | ICD-10-CM | POA: Diagnosis not present

## 2022-06-09 DIAGNOSIS — Z9842 Cataract extraction status, left eye: Secondary | ICD-10-CM | POA: Diagnosis not present

## 2022-06-09 DIAGNOSIS — Z9841 Cataract extraction status, right eye: Secondary | ICD-10-CM | POA: Diagnosis not present

## 2022-06-09 DIAGNOSIS — Z961 Presence of intraocular lens: Secondary | ICD-10-CM | POA: Diagnosis not present

## 2022-06-09 DIAGNOSIS — H52223 Regular astigmatism, bilateral: Secondary | ICD-10-CM | POA: Diagnosis not present

## 2022-06-09 DIAGNOSIS — H5202 Hypermetropia, left eye: Secondary | ICD-10-CM | POA: Diagnosis not present

## 2022-06-10 NOTE — Patient Instructions (Addendum)
Good to see you again today- assuming all is well let's visit in 6 months Recommend the shingles vaccine at your convenience at your pharmacy   I am not certain if there is still any glass in your foot, but if there is it should eventually work its way out especially since we removed some of the thickened skin today.  If it is becoming more painful or if you suspect infection please let me know  I sent a message to Dr. Debara Pickett asking if he can take you on as a new patient  I will place an order for a diagnostic mammogram in the computer-please call [facility] and make sure this has been updated prior to your upcoming appointment  We will treat your toenail fungus with terbinafine by mouth, take once a day for 3 months.  I will be in touch with your A1c!
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
## 2022-06-13 ENCOUNTER — Other Ambulatory Visit: Payer: Self-pay | Admitting: Family Medicine

## 2022-06-13 DIAGNOSIS — E785 Hyperlipidemia, unspecified: Secondary | ICD-10-CM

## 2022-06-18 ENCOUNTER — Encounter: Payer: Self-pay | Admitting: Family Medicine

## 2022-06-18 ENCOUNTER — Ambulatory Visit (INDEPENDENT_AMBULATORY_CARE_PROVIDER_SITE_OTHER): Payer: Medicare HMO | Admitting: Family Medicine

## 2022-06-18 VITALS — BP 112/60 | HR 73 | Temp 96.8°F | Resp 18 | Ht 65.0 in | Wt 121.2 lb

## 2022-06-18 DIAGNOSIS — E785 Hyperlipidemia, unspecified: Secondary | ICD-10-CM | POA: Diagnosis not present

## 2022-06-18 DIAGNOSIS — G35 Multiple sclerosis: Secondary | ICD-10-CM

## 2022-06-18 DIAGNOSIS — R7303 Prediabetes: Secondary | ICD-10-CM

## 2022-06-18 DIAGNOSIS — B351 Tinea unguium: Secondary | ICD-10-CM

## 2022-06-18 DIAGNOSIS — C8599 Non-Hodgkin lymphoma, unspecified, extranodal and solid organ sites: Secondary | ICD-10-CM | POA: Diagnosis not present

## 2022-06-18 DIAGNOSIS — I420 Dilated cardiomyopathy: Secondary | ICD-10-CM

## 2022-06-18 DIAGNOSIS — N644 Mastodynia: Secondary | ICD-10-CM | POA: Diagnosis not present

## 2022-06-18 DIAGNOSIS — N1831 Chronic kidney disease, stage 3a: Secondary | ICD-10-CM

## 2022-06-18 LAB — COMPREHENSIVE METABOLIC PANEL
ALT: 14 U/L (ref 0–35)
AST: 31 U/L (ref 0–37)
Albumin: 4.1 g/dL (ref 3.5–5.2)
Alkaline Phosphatase: 48 U/L (ref 39–117)
BUN: 27 mg/dL — ABNORMAL HIGH (ref 6–23)
CO2: 29 mEq/L (ref 19–32)
Calcium: 9.9 mg/dL (ref 8.4–10.5)
Chloride: 102 mEq/L (ref 96–112)
Creatinine, Ser: 1.03 mg/dL (ref 0.40–1.20)
GFR: 51.31 mL/min — ABNORMAL LOW (ref >60.00)
Glucose, Bld: 74 mg/dL (ref 70–99)
Potassium: 4.4 mEq/L (ref 3.5–5.1)
Sodium: 140 mEq/L (ref 135–145)
Total Bilirubin: 0.6 mg/dL (ref 0.2–1.2)
Total Protein: 7.1 g/dL (ref 6.0–8.3)

## 2022-06-18 LAB — HEMOGLOBIN A1C: Hgb A1c MFr Bld: 6 % (ref 4.6–6.5)

## 2022-06-18 MED ORDER — TERBINAFINE HCL 250 MG PO TABS: 250.0000 mg | ORAL_TABLET | Freq: Every day | ORAL | 0 refills | Status: DC

## 2022-06-26 ENCOUNTER — Telehealth: Payer: Self-pay | Admitting: *Deleted

## 2022-06-26 DIAGNOSIS — N644 Mastodynia: Secondary | ICD-10-CM

## 2022-06-26 NOTE — Telephone Encounter (Signed)
FYI [facility] called and stated that since patient is having a diagnostic mammogram and her issues that she will need an [place] bilaterally ordered.    [place] ordered.

## 2022-06-30 DIAGNOSIS — N644 Mastodynia: Secondary | ICD-10-CM | POA: Diagnosis not present

## 2022-07-08 DIAGNOSIS — Z0100 Encounter for examination of eyes and vision without abnormal findings: Secondary | ICD-10-CM | POA: Diagnosis not present

## 2022-07-23 ENCOUNTER — Telehealth: Payer: Self-pay | Admitting: Family Medicine

## 2022-07-23 NOTE — Telephone Encounter (Signed)
Copied from CRM [id number]. Topic: Medicare AWV >> Jul 23, 2022 11:13 AM Payton Doughty wrote: Reason for CRM: Called patient to schedule Medicare Annual Wellness Visit (AWV). No voicemail available to leave a message.  Last date of AWV: 12/25/20  Please schedule an appointment at any time with Donne Anon, CMA  .  If any questions, please contact me.  Thank you ,  Verlee Rossetti; Care Guide Ambulatory Clinical Support [place] [facility] Direct Dial: [phone]

## 2022-07-29 ENCOUNTER — Ambulatory Visit: Payer: Medicare HMO | Admitting: Neurology

## 2022-07-29 ENCOUNTER — Encounter: Payer: Self-pay | Admitting: Neurology

## 2022-07-29 VITALS — BP 141/76 | HR 79 | Ht 65.0 in | Wt 120.0 lb

## 2022-07-29 DIAGNOSIS — R269 Unspecified abnormalities of gait and mobility: Secondary | ICD-10-CM | POA: Diagnosis not present

## 2022-07-29 DIAGNOSIS — R413 Other amnesia: Secondary | ICD-10-CM

## 2022-07-29 DIAGNOSIS — G35 Multiple sclerosis: Secondary | ICD-10-CM

## 2022-07-29 DIAGNOSIS — G43019 Migraine without aura, intractable, without status migrainosus: Secondary | ICD-10-CM

## 2022-07-29 NOTE — Progress Notes (Addendum)
[facility]  PATIENT: [name] DOB: [birth year]  REFERRING DOCTOR OR PCP: Abbe Amsterdam, MD SOURCE: Patient, notes from Dr. Anne Hahn, imaging and lab reports, notes from [facility] oncology, MRI images personally reviewed.  _________________________________   HISTORICAL  CHIEF COMPLAINT:  Chief Complaint  Patient presents with   Room 11    Pt is here Alone. Pt states that her husband had left which caused her some stress. Pt states that she has had spasms in her hands for 2-3 weeks. Pt states that her left leg is stiff.      HISTORY OF PRESENT ILLNESS:  [name] is a 82 y.o. woman with a history of multiple sclerosis and B-cell lymphoma.  She is transferring care after Dr. Clarisa Kindred retirement.  UPDATE 07/29/2022: Her MS is mostly stable but her hands sometimes draw in.   These spasms occur for about 30 seconds a few times a month.   THe left is worse thann the right.    She also notes that when she turns her arms inwards, this might trigger a spell.  Other spells just happen.   Sometimes se feels she needs to loosen her hands before writing.   Currently, her main symptoms are mild intermittent left sided weakness and numbness.  She has had some tightness in the left hand.  She is noting more issues with left arm strength if she uses the arm more.    She walks 1/3 mile x 3 every day and does yoga.    She can easily do a mile.   She can go up and down stairs without the rail.       She has some urinary hesitancy.  She has Crohn's disease and has had some fecal incontinence since surgery years ago.  Vision is stable.  She denies much fatigue..    Denies depression.   She had anxiety in 2019 and some stress.  She sleeps well most nights.    She has more stress with separation from husband.  She has had some mild cognitive issues and takes memantine.  She does have mild atrophy on the MRI.  She has migraines once a month.   She rarely (every few months)  and takes  Advil.  She takes a lorazepam if migraine is more intense.        She stays active and went to [place] last year and pans on going to [place] next month.    She had B cell lymphoma.   She follows up [facility]  regularly.  She works part-time Medical laboratory scientific officer and other)   MS history She was diagnosed with MS in 2000 when she presented with numbness in both hands and left sided numbness   MRI was consistent with MS.   CSF was also c/w MS.     In retrospect, she had.left optic neuritis 1-2 years prior  that corrected itself without treatment.   She was placed on Betaseron initially.   She was placed on Gilenya in 2011.     She had large cell B cell lymphoma.  She was on Rituxan and CHOP and other medications followed by radiation (cancer was in her kidneys.    She sees [facility] (Dr. Regino Schultze) for the cancer and they feel she is in permanent remission.    Her last exacerbation (left foot drop) was in 2013 though    IMAGING: MRI brain 11/02/2020 showed Multiple T2/FLAIR hyperintense foci in the hemispheres and a couple foci in the  cerebellum.  These are nonspecific and the majority of the foci have an appearance most consistent with chronic microvascular ischemic change.  Overlap with demyelination is possible.  None of the foci appear to be acute.  They do not enhance.  Compared to the MRI from 2019 2021, there are no new lesions.      Mild stable generalized cortical atrophy.    MRI of the lumbar spine October 06, 2017 showed a small left disc extrusion at L2-L3 level and mild spinal canal stenosis. There is mild multilevel degenerative disc disease. MRI   REVIEW OF SYSTEMS: Constitutional: No fevers, chills, sweats, or change in appetite Eyes: No visual changes, double vision, eye pain Ear, nose and throat: No hearing loss, ear pain, nasal congestion, sore throat Cardiovascular: No chest pain, palpitations Respiratory:  No shortness of breath at rest or with exertion.   No  wheezes GastrointestinaI: No nausea, vomiting, diarrhea, abdominal pain.  Rare ecal incontinence Genitourinary:  No dysuria, urinary retention or frequency.  She has some hesitancy.  No nocturia. Musculoskeletal:  No neck pain, back pain Integumentary: No rash, pruritus, skin lesions Neurological: as above Psychiatric: No depression at this time.  No anxiety Endocrine: No palpitations, diaphoresis, change in appetite, change in weigh or increased thirst Hematologic/Lymphatic:  No anemia, purpura, petechiae. Allergic/Immunologic: No itchy/runny eyes, nasal congestion, recent allergic reactions, rashes  ALLERGIES: Allergies  Allergen Reactions   Sulfa Antibiotics Hives and Nausea And Vomiting   Vytorin [Ezetimibe-Simvastatin]     White skin patch   Aubagio [Teriflunomide]     diarrhea   Cephalexin    Doxycycline     Raw mouth    Erythromycin     REACTION: weakness   Macrobid [Nitrofurantoin Macrocrystal] Hives   Morphine Nausea And Vomiting   Rituxan [Rituximab]     Stated had issue with lungs after her last treatment   Sulfonamide Derivatives Hives and Nausea And Vomiting   Amoxicillin Hives    HOME MEDICATIONS:  Current Outpatient Medications:    b complex vitamins tablet, Take 1 tablet by mouth daily., Disp: , Rfl:    cetirizine (ZYRTEC) 10 MG tablet, Take 10 mg by mouth daily., Disp: , Rfl:    Cholecalciferol (VITAMIN D3) 5000 units TABS, Take 5,000 Units by mouth daily. , Disp: , Rfl:    fenofibrate (TRICOR) 145 MG tablet, Take 1 tablet (145 mg total) by mouth daily., Disp: 90 tablet, Rfl: 2   gabapentin (NEURONTIN) 600 MG tablet, TAKE 1 TABLET BY MOUTH THREE TIMES A DAY, Disp: 270 tablet, Rfl: 3   Glucosamine 500 MG TABS, Take 1 tablet by mouth 2 (two) times daily., Disp: , Rfl:    memantine (NAMENDA) 10 MG tablet, TAKE 1 TABLET BY MOUTH TWICE A DAY, Disp: 180 tablet, Rfl: 3   Multiple Vitamin (MULTIVITAMIN) tablet, Take 1 tablet by mouth daily. , Disp: , Rfl:     rosuvastatin (CRESTOR) 20 MG tablet, TAKE 1 TABLET BY MOUTH EVERY DAY, Disp: 90 tablet, Rfl: 1   traZODone (DESYREL) 50 MG tablet, TAKE 0.5-1 TABLETS BY MOUTH AT BEDTIME AS NEEDED FOR SLEEP., Disp: 90 tablet, Rfl: 2   terbinafine (LAMISIL) 250 MG tablet, Take 1 tablet (250 mg total) by mouth daily., Disp: 90 tablet, Rfl: 0  PAST MEDICAL HISTORY: Past Medical History:  Diagnosis Date   Allergy    Arthritis, degenerative    both knees   B12 deficiency 07/06/2014   Cancer    Colitis    Common migraine with intractable  migraine 07/12/2020   Diverticulosis    Duodenitis    Eczema    Fatty liver    Gastritis    GERD (gastroesophageal reflux disease)    Heart murmur    Helicobacter pylori gastritis    Hyperlipidemia    Hypertension    not treated, pt reports "no"   IBD (inflammatory bowel disease)    Lymphoma    Memory difficulty 01/05/2013   Memory loss    mild   Multiple sclerosis    Osteopenia    Vitamin B12 deficiency     PAST SURGICAL HISTORY: Past Surgical History:  Procedure Laterality Date   ABDOMINAL HYSTERECTOMY  2010   BLADDER SURGERY  2010   Bladder Tack   CERVICAL SPINE SURGERY     CHOLECYSTECTOMY     HEMORRHOID SURGERY     ROTATOR CUFF REPAIR     SPINAL FUSION     TUBAL LIGATION      FAMILY HISTORY: Family History  Problem Relation Age of Onset   Colon polyps Mother    Heart failure Mother    Diabetes Father    Heart disease Father    Colon polyps Brother    Hypertension Brother    Colon cancer Maternal Grandmother        ? 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  Tobacco Use   Smoking status: Former    Types: Cigarettes    Quit date: 06/27/1997    Years since quitting: 25.1    Passive exposure: Past   Smokeless tobacco: Never  Vaping Use   Vaping Use: Never used  Substance and Sexual Activity   Alcohol use: No   Drug use: No   Sexual activity: Not Currently  Other Topics Concern   Not on file  Social History Narrative   Left Handed   Drinks 1 cup caffeine daily   Social Determinants of Health   Financial Resource Strain: Low Risk  (01/09/2021)   Overall Financial Resource Strain (CARDIA)    Difficulty of Paying Living Expenses: Not hard at all  Food Insecurity: No Food Insecurity (01/09/2021)   Hunger Vital Sign    Worried About Running Out of Food in the Last Year: Never true    Ran Out of Food in the Last Year: Never true  Transportation Needs: No Transportation Needs (01/09/2021)   PRAPARE - Administrator, Civil Service (Medical): No    Lack of Transportation (Non-Medical): No  Physical Activity: Sufficiently Active (01/09/2021)   Exercise Vital Sign    Days of Exercise per Week: 5 days    Minutes of Exercise per Session: 40 min  Stress: No Stress Concern Present (01/09/2021)   [employer] of Occupational Health - Occupational Stress Questionnaire    Feeling of Stress : Not at all  Social Connections: Socially Integrated (01/09/2021)   Social Connection and Isolation Panel [NHANES]  Frequency of Communication with Friends and Family: More than three times a week    Frequency of Social Gatherings with Friends and Family: More than three times a week    Attends Religious Services: More than 4 times per year    Active Member of [employer] or Organizations: Yes    Attends Banker Meetings: More than 4 times per year    Marital Status: Married  Catering manager Violence: Not At Risk (01/09/2021)    Humiliation, Afraid, Rape, and Kick questionnaire    Fear of Current or Ex-Partner: No    Emotionally Abused: No    Physically Abused: No    Sexually Abused: No     PHYSICAL EXAM  Vitals:   07/29/22 0942  BP: (!) 141/76  Pulse: 79  Weight: 120 lb (54.4 kg)  Height: 5\' 5"  (1.651 m)    Body mass index is 19.97 kg/m.   General: The patient is well-developed and well-nourished and in no acute distress  HEENT:  Head is [place]/AT.  Sclera are anicteric.    Skin: Extremities are without rash or  edema.   NEURO Mental status: The patient is alert and oriented x 3 at the time of the examination. The patient has apparent normal recent and remote memory, with an apparently normal attention span and concentration ability.   Speech is normal.  Cranial nerves: Extraocular movements are full.  Facial strength and sensation was normal.. No obvious hearing deficits are noted.  Motor:  Muscle bulk is normal.   Tone is normal. Strength is  5 / 5 in all 4 extremities.  Very mild reduced rapid alternating movements in left hand  Sensory: Sensory testing is intact to pinprick, soft touch and vibration sensation in all 4 extremities.  Coordination: Cerebellar testing reveals good finger-nose-finger and heel-to-shin bilaterally.  Gait and station: Station is normal.   Gait is normal. Tandem gait is mildly wide. Romberg is negative.   Reflexes: Deep tendon reflexes are symmetric and normal in arms and ankles and mildly increased at the knees.       DIAGNOSTIC DATA (LABS, IMAGING, TESTING) - I reviewed patient records, labs, notes, testing and imaging myself where available.  Lab Results  Component Value Date   WBC 3.7 (L) 03/12/2022   HGB 12.7 03/12/2022   HCT 37.9 03/12/2022   MCV 95.2 03/12/2022   PLT 200.0 03/12/2022      Component Value Date/Time   NA 140 06/18/2022 0930   NA 138 07/05/2020 0000   NA 141 05/06/2013 0926   K 4.4 06/18/2022 0930   K 3.9 05/06/2013 0926   CL 102  06/18/2022 0930   CO2 29 06/18/2022 0930   CO2 26 05/06/2013 0926   GLUCOSE 74 06/18/2022 0930   GLUCOSE 119 05/06/2013 0926   BUN 27 (H) 06/18/2022 0930   BUN 22 (A) 07/05/2020 0000   BUN 21.6 05/06/2013 0926   CREATININE 1.03 06/18/2022 0930   CREATININE 0.8 05/06/2013 0926   CALCIUM 9.9 06/18/2022 0930   CALCIUM 10.0 05/06/2013 0926   PROT 7.1 06/18/2022 0930   PROT 6.9 09/26/2019 1354   PROT 7.7 05/06/2013 0926   ALBUMIN 4.1 06/18/2022 0930   ALBUMIN 4.2 09/26/2019 1354   ALBUMIN 4.1 05/06/2013 0926   AST 31 06/18/2022 0930   AST 24 05/06/2013 0926   ALT 14 06/18/2022 0930   ALT 16 05/06/2013 0926   ALKPHOS 48 06/18/2022 0930   ALKPHOS 49 05/06/2013 0926   BILITOT 0.6 06/18/2022 0930  BILITOT 0.4 09/26/2019 1354   BILITOT 0.58 05/06/2013 0926   GFRNONAA 48 07/05/2020 0000   GFRNONAA 47 (L) 02/13/2020 1719   GFRAA 60 09/26/2019 1354   Lab Results  Component Value Date   CHOL 84 12/18/2021   HDL 31.00 (L) 12/18/2021   LDLCALC 42 01/24/2020   LDLDIRECT 29.0 12/18/2021   TRIG 210.0 (H) 12/18/2021   CHOLHDL 3 12/18/2021   Lab Results  Component Value Date   HGBA1C 6.0 06/18/2022   Lab Results  Component Value Date   VITAMINB12 559 01/14/2021   Lab Results  Component Value Date   TSH 1.01 12/18/2021       ASSESSMENT AND PLAN  Multiple sclerosis  Common migraine with intractable migraine  Abnormality of gait  Memory loss  Memory difficulty      We discussed that she would likely not need to go back on a disease modifying therapy as MS often becomes far less active later in life.  She also may have received some short-term benefit from Rituxan and short and long-term benefit from the CHOP therapy. We discussed that if the mild spasticity in the left arm worsens we could consider baclofen.  She prefers to hold off at this point.  Migraines are rare but if frequency increases we could consider a prophylactic agent.  If her current treatment regimen  ceases to help we could consider an anti-CGRP agent. She will return to see [place] in 1 year or sooner if there are new or worsening neurologic symptoms.    Shi Grose A. Epimenio Foot, MD, [facility] 07/29/2022, 10:18 AM Certified in Neurology, Clinical Neurophysiology, Sleep Medicine and Neuroimaging  [facility] [address], [address] [place], [place] [address] [phone]

## 2022-08-03 ENCOUNTER — Other Ambulatory Visit: Payer: Self-pay | Admitting: Neurology

## 2022-08-03 ENCOUNTER — Other Ambulatory Visit: Payer: Self-pay | Admitting: Family Medicine

## 2022-08-03 DIAGNOSIS — E785 Hyperlipidemia, unspecified: Secondary | ICD-10-CM

## 2022-08-03 DIAGNOSIS — B351 Tinea unguium: Secondary | ICD-10-CM

## 2022-08-04 ENCOUNTER — Encounter: Payer: Self-pay | Admitting: Family Medicine

## 2022-08-04 NOTE — Telephone Encounter (Signed)
Last seen on 07/29/22 per note "She has had some mild cognitive issues and takes memantine " Follow up scheduled on 07/29/23 Last filled on 07/06/22 #180 tablets (90 day supply)

## 2022-08-14 ENCOUNTER — Ambulatory Visit (HOSPITAL_BASED_OUTPATIENT_CLINIC_OR_DEPARTMENT_OTHER)
Admission: RE | Admit: 2022-08-14 | Discharge: 2022-08-14 | Disposition: A | Discharge status: Home / Self Care | Payer: Medicare HMO | Source: Ambulatory Visit | Attending: Cardiology | Admitting: Cardiology

## 2022-08-14 DIAGNOSIS — I420 Dilated cardiomyopathy: Secondary | ICD-10-CM | POA: Insufficient documentation

## 2022-08-14 LAB — ECHOCARDIOGRAM COMPLETE
Area-P 1/2: 4.54 cm2
P 1/2 time: 368 msec
S' Lateral: 2.8 cm

## 2022-09-09 ENCOUNTER — Encounter: Payer: Self-pay | Admitting: Internal Medicine

## 2022-09-09 ENCOUNTER — Ambulatory Visit: Payer: Medicare HMO | Attending: Internal Medicine | Admitting: Internal Medicine

## 2022-09-09 VITALS — BP 120/80 | HR 94 | Ht 65.5 in | Wt 118.2 lb

## 2022-09-09 DIAGNOSIS — I420 Dilated cardiomyopathy: Secondary | ICD-10-CM | POA: Diagnosis not present

## 2022-09-09 DIAGNOSIS — Z79899 Other long term (current) drug therapy: Secondary | ICD-10-CM | POA: Diagnosis not present

## 2022-09-09 DIAGNOSIS — I2584 Coronary atherosclerosis due to calcified coronary lesion: Secondary | ICD-10-CM

## 2022-09-09 DIAGNOSIS — I452 Bifascicular block: Secondary | ICD-10-CM | POA: Diagnosis not present

## 2022-09-09 DIAGNOSIS — E782 Mixed hyperlipidemia: Secondary | ICD-10-CM

## 2022-09-09 DIAGNOSIS — I2510 Atherosclerotic heart disease of native coronary artery without angina pectoris: Secondary | ICD-10-CM | POA: Diagnosis not present

## 2022-09-09 MED ORDER — SACUBITRIL-VALSARTAN 24-26 MG PO TABS: 1.0000 | ORAL_TABLET | Freq: Two times a day (BID) | ORAL | 11 refills | Status: DC

## 2022-09-09 NOTE — Patient Instructions (Signed)
Medication Instructions:  START entresto 24/26mg  twice daily (every 12 hours)  stay well hydrated  *If you need a refill on your cardiac medications before your next appointment, please call your pharmacy*   Lab Work: Non-Fasting CMET in 1-2 weeks  If you have labs (blood work) drawn today and your tests are completely normal, you will receive your results only by: MyChart Message (if you have MyChart) OR A paper copy in the mail If you have any lab test that is abnormal or we need to change your treatment, we will call you to review the results.    Follow-Up: At [facility], you and your health needs are our priority.  As part of our continuing mission to provide you with exceptional heart care, we have created designated Provider Care Teams.  These Care Teams include your primary Cardiologist (physician) and Advanced Practice Providers (APPs -  Physician Assistants and Nurse Practitioners) who all work together to provide you with the care you need, when you need it.  We recommend signing up for the patient portal called "MyChart".  Sign up information is provided on this After Visit Summary.  MyChart is used to connect with patients for Virtual Visits (Telemedicine).  Patients are able to view lab/test results, encounter notes, upcoming appointments, etc.  Non-urgent messages can be sent to your provider as well.   To learn more about what you can do with MyChart, go to [email or web address].    Your next appointment:    6 months with Dr. Rennis Golden

## 2022-09-09 NOTE — Progress Notes (Signed)
OFFICE CONSULT NOTE  Chief Complaint:  Established cardiologist  Primary Care Physician: Pearline Cables, MD  HPI:  [name] is a 82 y.o. female who is being seen today for the evaluation of cardiomyopathy at the request of Copland, Gwenlyn Found, MD. this is a pleasant 82 year old female who wishes to establish care in [place].  She has been seeing Dr. Dulce Sellar in [place] for a dilated cardiomyopathy.  This was diagnosed back in 2001 at which time she was found to have lymphoma and underwent R-CHOP chemotherapy.  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  This time her LVEF went down to 40 to 45%.  Subsequently her EF improved into the 45 to 50% range and then based on her last 2 echoes her EF has been at 50 to 55%.  She reports NYHA class I symptoms.  Surprisingly, however she is on no medications for heart failure.  In fact she only is on 2 lipid-lowering medications.  She reports stroke and heart disease in both parents.  Blood pressure was 120/80 today.  She says in general her blood pressure is low to low normal.  She has not previously tried any heart failure medications.  PMHx:  Past Medical History:  Diagnosis Date   Allergy    Arthritis, degenerative    both knees   B12 deficiency 07/06/2014   Cancer (HCC)    Colitis    Common migraine with intractable migraine 07/12/2020   Diverticulosis    Duodenitis    Eczema    Fatty liver    Gastritis    GERD (gastroesophageal reflux disease)    Heart murmur    Helicobacter pylori gastritis    Hyperlipidemia    Hypertension    not treated, pt reports "no"   IBD (inflammatory bowel disease)    Lymphoma (HCC)    Memory difficulty 01/05/2013   Memory loss    mild   Multiple sclerosis (HCC)    Osteopenia    Vitamin B12 deficiency     Past Surgical History:  Procedure Laterality Date   ABDOMINAL HYSTERECTOMY  2010   BLADDER SURGERY  2010   Bladder Tack   CERVICAL SPINE SURGERY      CHOLECYSTECTOMY     HEMORRHOID SURGERY     ROTATOR CUFF REPAIR     SPINAL FUSION     TUBAL LIGATION      FAMHx:  Family History  Problem Relation Age of Onset   Colon polyps Mother    Heart failure Mother    Diabetes Father    Heart disease Father    Colon polyps Brother    Hypertension Brother    Colon cancer Maternal Grandmother        ? may be duodenal cancer   Pancreatic cancer Maternal Uncle    Breast cancer Cousin    Esophageal cancer Neg Hx    Rectal cancer Neg Hx    Stomach cancer Neg Hx     SOCHx:   reports that she quit smoking about 25 years ago. Her smoking use included cigarettes. She has been exposed to tobacco smoke. She has never used smokeless tobacco. She reports that she does not drink alcohol and does not use drugs.  ALLERGIES:  Allergies  Allergen Reactions   Sulfa Antibiotics Hives and Nausea And Vomiting   Vytorin [Ezetimibe-Simvastatin]     White skin patch   Aubagio [Teriflunomide]     diarrhea   Cephalexin    Doxycycline  Raw mouth    Erythromycin     REACTION: weakness   Macrobid [Nitrofurantoin Macrocrystal] Hives   Morphine Nausea And Vomiting   Rituxan [Rituximab]     Stated had issue with lungs after her last treatment   Sulfonamide Derivatives Hives and Nausea And Vomiting   Amoxicillin Hives    ROS: Pertinent items noted in HPI and remainder of comprehensive ROS otherwise negative.  HOME MEDS: Current Outpatient Medications on File Prior to Visit  Medication Sig Dispense Refill   b complex vitamins tablet Take 1 tablet by mouth daily.     cetirizine (ZYRTEC) 10 MG tablet Take 10 mg by mouth daily.     Cholecalciferol (VITAMIN D3) 5000 units TABS Take 5,000 Units by mouth daily.      desonide (DESOWEN) 0.05 % lotion APPLY TOPICALLY 2 (TWO) TIMES DAILY. USE AS NEEDED FOR ITCHY EARS 59 mL 1   fenofibrate (TRICOR) 145 MG tablet TAKE 1 TABLET BY MOUTH EVERY DAY 90 tablet 2   gabapentin (NEURONTIN) 600 MG tablet TAKE 1 TABLET  BY MOUTH THREE TIMES A DAY 270 tablet 3   Glucosamine 500 MG TABS Take 1 tablet by mouth 2 (two) times daily.     memantine (NAMENDA) 10 MG tablet TAKE 1 TABLET BY MOUTH TWICE A DAY 180 tablet 3   Multiple Vitamin (MULTIVITAMIN) tablet Take 1 tablet by mouth daily.      rosuvastatin (CRESTOR) 20 MG tablet TAKE 1 TABLET BY MOUTH EVERY DAY 90 tablet 1   terbinafine (LAMISIL) 250 MG tablet Take 250 mg by mouth daily.     traZODone (DESYREL) 50 MG tablet TAKE 0.5-1 TABLETS BY MOUTH AT BEDTIME AS NEEDED FOR SLEEP. 90 tablet 2   No current facility-administered medications on file prior to visit.    LABS/IMAGING: No results found for this or any previous visit (from the past 48 hour(s)). No results found.  LIPID PANEL:    Component Value Date/Time   CHOL 84 12/18/2021 0942   TRIG 210.0 (H) 12/18/2021 0942   HDL 31.00 (L) 12/18/2021 0942   CHOLHDL 3 12/18/2021 0942   VLDL 42.0 (H) 12/18/2021 0942   LDLCALC 42 01/24/2020 1032   LDLDIRECT 29.0 12/18/2021 0942    WEIGHTS: Wt Readings from Last 3 Encounters:  09/09/22 118 lb 3.2 oz (53.6 kg)  07/29/22 120 lb (54.4 kg)  06/18/22 121 lb 3.2 oz (55 kg)    VITALS: BP 120/80   Pulse 94   Ht 5' 5.5" (1.664 m)   Wt 118 lb 3.2 oz (53.6 kg)   SpO2 97%   BMI 19.37 kg/m   EXAM: General appearance: alert and no distress Neck: no carotid bruit, no JVD, and thyroid not enlarged, symmetric, no tenderness/mass/nodules Lungs: clear to auscultation bilaterally Heart: regular rate and rhythm, S1, S2 normal, no murmur, click, rub or gallop Abdomen: soft, non-tender; bowel sounds normal; no masses,  no organomegaly Extremities: extremities normal, atraumatic, no cyanosis or edema Pulses: 2+ and symmetric Skin: Skin color, texture, turgor normal. No rashes or lesions Neurologic: Grossly normal Psych: Pleasant  EKG: Normal sinus rhythm at 94, incomplete right bundle branch block- personally reviewed  ASSESSMENT: History of dilated  cardiomyopathy secondary to chemotherapy History of lymphoma in remission after R-CHOP Family history of cardiovascular disease in both parents Dyslipidemia  PLAN: 1.   [name] has a history of dilated cardiomyopathy secondary to chemotherapy with an improvement in LVEF from as low as 40% in 2021 up to 50 to 55% however  not on medical therapy.  Her EF has been stable and she has NYHA class I symptoms although is not normal.  She did have a Myoview stress test in 2020 which showed an EF of 46% but no ischemia.  She denies any anginal symptoms.  She has not really been on any medications that I can tell for heart failure.  I do not think that blood pressure was a limiting factor.  I would like to start low-dose Entresto 24/26 mg twice daily.  She would not likely tolerate SGLT2 inhibitor due to a history of urinary tract infections.  We could also consider beta-blocker as her heart rate tends to run in the upper 90s but she does have some conduction delay on EKG.  I encouraged her to stay well-hydrated.  Will plan a repeat metabolic profile in about 2 weeks.  Of note she is also on Lamisil and statin and fenofibrate, all of which are heavily metabolized in the liver and it would be good to recheck her liver enzymes on this therapy.  Follow-up with me in 6 months or sooner as necessary.  Chrystie Nose, MD, [facility], FACP  [place]  [facility]  Medical Director of the Advanced Lipid Disorders &  Cardiovascular Risk Reduction Clinic Diplomate of the American Board of Clinical Lipidology Attending Cardiologist  Direct Dial: [phone]  Fax: [phone]  Website:  [email or web address] 09/09/2022, 9:40 AM

## 2022-09-18 DIAGNOSIS — R918 Other nonspecific abnormal finding of lung field: Secondary | ICD-10-CM | POA: Diagnosis not present

## 2022-09-18 DIAGNOSIS — C8339 Diffuse large B-cell lymphoma, extranodal and solid organ sites: Secondary | ICD-10-CM | POA: Diagnosis not present

## 2022-09-21 ENCOUNTER — Other Ambulatory Visit: Payer: Self-pay | Admitting: Neurology

## 2022-09-21 ENCOUNTER — Other Ambulatory Visit: Payer: Self-pay | Admitting: Family Medicine

## 2022-09-22 DIAGNOSIS — Z79899 Other long term (current) drug therapy: Secondary | ICD-10-CM | POA: Diagnosis not present

## 2022-09-22 NOTE — Telephone Encounter (Signed)
Pt last seen on 07/29/22 per note "She has had some mild cognitive issues and takes memantine. " Follow up scheduled on 07/29/23 Rx last filled on 07/06/22 #180 tablets (90 day supply)

## 2022-09-23 LAB — COMPREHENSIVE METABOLIC PANEL
ALT: 16 IU/L (ref 0–32)
AST: 35 IU/L (ref 0–40)
Albumin/Globulin Ratio: 1.9
Albumin: 4.5 g/dL (ref 3.8–4.8)
Alkaline Phosphatase: 57 IU/L (ref 44–121)
BUN/Creatinine Ratio: 20 (ref 12–28)
BUN: 24 mg/dL (ref 8–27)
Bilirubin Total: 0.4 mg/dL (ref 0.0–1.2)
CO2: 25 mmol/L (ref 20–29)
Calcium: 9.1 mg/dL (ref 8.7–10.3)
Chloride: 102 mmol/L (ref 96–106)
Creatinine, Ser: 1.23 mg/dL — ABNORMAL HIGH (ref 0.57–1.00)
Globulin, Total: 2.4 g/dL (ref 1.5–4.5)
Glucose: 80 mg/dL (ref 70–99)
Potassium: 4.6 mmol/L (ref 3.5–5.2)
Sodium: 138 mmol/L (ref 134–144)
Total Protein: 6.9 g/dL (ref 6.0–8.5)
eGFR: 44 mL/min/{1.73_m2} — ABNORMAL LOW (ref >59)

## 2022-09-29 ENCOUNTER — Other Ambulatory Visit: Payer: Self-pay | Admitting: *Deleted

## 2022-09-29 DIAGNOSIS — Z79899 Other long term (current) drug therapy: Secondary | ICD-10-CM

## 2022-10-07 LAB — BASIC METABOLIC PANEL
BUN/Creatinine Ratio: 21 (ref 12–28)
BUN: 24 mg/dL (ref 8–27)
CO2: 26 mmol/L (ref 20–29)
Calcium: 9.7 mg/dL (ref 8.7–10.3)
Chloride: 105 mmol/L (ref 96–106)
Creatinine, Ser: 1.14 mg/dL — ABNORMAL HIGH (ref 0.57–1.00)
Glucose: 93 mg/dL (ref 70–99)
Potassium: 4.5 mmol/L (ref 3.5–5.2)
Sodium: 143 mmol/L (ref 134–144)
eGFR: 49 mL/min/{1.73_m2} — ABNORMAL LOW (ref >59)

## 2022-10-20 DIAGNOSIS — B351 Tinea unguium: Secondary | ICD-10-CM | POA: Diagnosis not present

## 2022-10-20 DIAGNOSIS — L84 Corns and callosities: Secondary | ICD-10-CM | POA: Diagnosis not present

## 2022-10-20 DIAGNOSIS — D2262 Melanocytic nevi of left upper limb, including shoulder: Secondary | ICD-10-CM | POA: Diagnosis not present

## 2022-10-20 DIAGNOSIS — D2261 Melanocytic nevi of right upper limb, including shoulder: Secondary | ICD-10-CM | POA: Diagnosis not present

## 2022-10-24 ENCOUNTER — Other Ambulatory Visit: Payer: Self-pay | Admitting: Family Medicine

## 2022-10-24 DIAGNOSIS — E785 Hyperlipidemia, unspecified: Secondary | ICD-10-CM

## 2022-10-24 DIAGNOSIS — F5102 Adjustment insomnia: Secondary | ICD-10-CM

## 2022-11-27 ENCOUNTER — Encounter (INDEPENDENT_AMBULATORY_CARE_PROVIDER_SITE_OTHER): Payer: Self-pay

## 2022-12-16 NOTE — Progress Notes (Signed)
[facility] [address], [address] [place], [place] [address] [phone]  Date:  12/22/2022   Name:  [name]   DOB:  1941/04/14   MRN:  [id number]  PCP:  Pearline Cables, MD    Chief Complaint: 6 month follow up (Concerns/ questions: 1. Decreased urine flow. 2. Rash across the low back. 3. Corn on the R foot, referral? 4. Recheck toenail fungus. 5. Pt say she sometimes hears her heartbeat in the R ear. /AWV due/Shingrix- Medicare pt/Flu shot today: yes/Mammogram- overdue)   History of Present Illness:  [name] is a 82 y.o. very pleasant female patient who presents with the following:  Pt seen today for periodic follow-up Last seen by myself in March History of multiple sclerosis, osteopenia, Crohn's disease and diverticulitis, dilated cardiomyopathy, IBD.  She also has lymphoma which is being followed by Dr. Janyth Contes at [facility] She does have mild cognitive impairment, taking Namenda   Her husband of many years Virl Diamond started accusing her of multiple affairs (pt states this is totally false) and had moved out-patient reports unfortunately this is still the case.  They are still living apart Her children are supportive  HemeOnc visit in June- Dr Adelina Mings at [facility]: Plan:  [name] is doing well after treatment, and require yearly follow ups at this point for surveillance, as she will be 5 years out at her next Heme Onc appointment. Her kidney function remains good and her left kidney does not appear to show evidence of atrophy on imaging. She will follow with Dr. Regino Schultze with labs, next in 01/2023, and with our clinic as needed at this point. We will follow up on the results of her CT chest later today, which has been ordered to track her lung nodules over time (last seen to be decreasing from 9mm to 5mm).  They are following a lung nodule at [facility]- CT dated 09/18/22 IMPRESSION: 1.  New large nodule within the apex of the right  lung. Differential diagnosis includes infectious or inflammatory processes, although neoplasm cannot be excluded. Recommend CT follow-up in 3 months. 2.  Stable bilateral pulmonary nodules. No new or enlarging pulmonary nodules.   Seen by Dr Rennis Golden 5/28- for cardiomyopathy: ASSESSMENT: History of dilated cardiomyopathy secondary to chemotherapy History of lymphoma in remission after R-CHOP Family history of cardiovascular disease in both parents Dyslipidemia PLAN: 1.   [name] has a history of dilated cardiomyopathy secondary to chemotherapy with an improvement in LVEF from as low as 40% in 2021 up to 50 to 55% however not on medical therapy.  Her EF has been stable and she has NYHA class I symptoms although is not normal.  She did have a Myoview stress test in 2020 which showed an EF of 46% but no ischemia.  She denies any anginal symptoms.  She has not really been on any medications that I can tell for heart failure.  I do not think that blood pressure was a limiting factor.  I would like to start low-dose Entresto 24/26 mg twice daily.  She would not likely tolerate SGLT2 inhibitor due to a history of urinary tract infections.  We could also consider beta-blocker as her heart rate tends to run in the upper 90s but she does have some conduction delay on EKG.  I encouraged her to stay well-hydrated.  Will plan a repeat metabolic profile in about 2 weeks.  Of note she is also  on Lamisil and statin and fenofibrate, all of which are heavily metabolized in the liver and it would be good to recheck her liver enzymes on this therapy. Follow-up with me in 6 months or sooner as necessary.  Needs flu and covid this fall- will do flu today  S[facility]les- [facility], I asked her to confirm next time she is in Mammo done in March Dexa- can be updated in October   She notes her urine flow seems to be "slow" for the last 5 years or so  Not changing or getting worse It just takes her  longer to empty her bladder but no other sx She does have history of renal insufficiency  She notes possible eczema on her back and is using an OTC cream- helps somewhat  She notes history of eczema for perhaps a couple of years She is using a free and clear detergent  She notes a corn on her foot- she wonders if this could be removed She did have this treated by her Derm Dr Yetta Barre over the summer but it did not quite go away  She notes that "sometimes I hear my heartbeat in my right side neck" for maybe a year She notes that she had her carotids checked maybe 20 years ago The sx will come and go- she notes it more when all is quiet esp at night when she is laying in bed prior to sleep It does not seem like tinnitus necessarily     Patient Active Problem List   Diagnosis Date Noted   Eczema 03/13/2021   Prediabetes 10/30/2020   Common migraine with intractable migraine 07/12/2020   Vitamin B12 deficiency    Memory loss    Lymphoma (HCC)    IBD (inflammatory bowel disease)    Hypertension    Hyperlipidemia    Helicobacter pylori gastritis    Heart murmur    GERD (gastroesophageal reflux disease)    Gastritis    Fatty liver    Diverticulosis    Colitis    Cancer (HCC)    Arthritis, degenerative    Allergy    New onset headache 09/26/2019   Dilated cardiomyopathy (HCC) 03/02/2019   Osteopenia 12/25/2018   Hypokalemia 02/28/2018   Sepsis (HCC) 02/27/2018   Diverticulitis 02/27/2018   Diffuse large B-cell lymphoma (HCC) 02/27/2018   Osteoarthritis of cervical spine 09/15/2016   Abnormality of gait 11/07/2015   Dizziness and giddiness 11/07/2015   Mild cognitive impairment 01/09/2015   B12 deficiency 07/06/2014   Leukopenia 05/06/2013   Memory difficulty 01/05/2013   Dyslipidemia 11/28/2008   Multiple sclerosis (HCC) 11/28/2008   INFLAMMATORY BOWEL DISEASE 11/28/2008   DIVERTICULOSIS, COLON 11/28/2008   PERSONAL HISTORY OF PEPTIC ULCER DISEASE 11/28/2008    Past  Medical History:  Diagnosis Date   Allergy    Arthritis, degenerative    both knees   B12 deficiency 07/06/2014   Cancer (HCC)    Colitis    Common migraine with intractable migraine 07/12/2020   Diverticulosis    Duodenitis    Eczema    Fatty liver    Gastritis    GERD (gastroesophageal reflux disease)    Heart murmur    Helicobacter pylori gastritis    Hyperlipidemia    Hypertension    not treated, pt reports "no"   IBD (inflammatory bowel disease)    Lymphoma (HCC)    Memory difficulty 01/05/2013   Memory loss    mild   Multiple  sclerosis (HCC)    Osteopenia    Vitamin B12 deficiency     Past Surgical History:  Procedure Laterality Date   ABDOMINAL HYSTERECTOMY  2010   BLADDER SURGERY  2010   Bladder Tack   CERVICAL SPINE SURGERY     CHOLECYSTECTOMY     HEMORRHOID SURGERY     ROTATOR CUFF REPAIR     SPINAL FUSION     TUBAL LIGATION      Social History   Tobacco Use   Smoking status: Former    Current packs/day: 0.00    Types: Cigarettes    Quit date: 06/27/1997    Years since quitting: 25.5    Passive exposure: Past   Smokeless tobacco: Never  Vaping Use   Vaping status: Never Used  Substance Use Topics   Alcohol use: No   Drug use: No    Family History  Problem Relation Age of Onset   Colon polyps Mother    Heart failure Mother    Diabetes Father    Heart disease Father    Colon polyps Brother    Hypertension Brother    Colon cancer Maternal Grandmother        ? may be duodenal cancer   Pancreatic cancer Maternal Uncle    Breast cancer Cousin    Esophageal cancer Neg Hx    Rectal cancer Neg Hx    Stomach cancer Neg Hx     Allergies  Allergen Reactions   Sulfa Antibiotics Hives and Nausea And Vomiting   Vytorin [Ezetimibe-Simvastatin]     White skin patch   Aubagio [Teriflunomide]     diarrhea   Cephalexin    Doxycycline     Raw mouth    Erythromycin     REACTION: weakness   Macrobid [Nitrofurantoin Macrocrystal] Hives    Morphine Nausea And Vomiting   Rituxan [Rituximab]     Stated had issue with lungs after her last treatment   Sulfonamide Derivatives Hives and Nausea And Vomiting   Amoxicillin Hives    Medication list has been reviewed and updated.  Current Outpatient Medications on File Prior to Visit  Medication Sig Dispense Refill   b complex vitamins tablet Take 1 tablet by mouth daily.     cetirizine (ZYRTEC) 10 MG tablet Take 10 mg by mouth daily.     Cholecalciferol (VITAMIN D3) 5000 units TABS Take 5,000 Units by mouth daily.      desonide (DESOWEN) 0.05 % lotion APPLY TOPICALLY 2 (TWO) TIMES DAILY. USE AS NEEDED FOR ITCHY EARS 59 mL 1   fenofibrate (TRICOR) 145 MG tablet TAKE 1 TABLET BY MOUTH EVERY DAY 90 tablet 2   gabapentin (NEURONTIN) 600 MG tablet TAKE 1 TABLET BY MOUTH THREE TIMES A DAY 270 tablet 3   Glucosamine 500 MG TABS Take 1 tablet by mouth 2 (two) times daily.     memantine (NAMENDA) 10 MG tablet TAKE 1 TABLET BY MOUTH TWICE A DAY 180 tablet 3   Multiple Vitamin (MULTIVITAMIN) tablet Take 1 tablet by mouth daily.      rosuvastatin (CRESTOR) 20 MG tablet TAKE 1 TABLET BY MOUTH EVERY DAY 90 tablet 1   sacubitril-valsartan (ENTRESTO) 24-26 MG Take 1 tablet by mouth 2 (two) times daily. 60 tablet 11   terbinafine (LAMISIL) 250 MG tablet Take 250 mg by mouth daily.     traZODone (DESYREL) 50 MG tablet TAKE 1/2 TO 1 TABLET BY MOUTH AT BEDTIME AS NEEDED FOR SLEEP 90 tablet 2  No current facility-administered medications on file prior to visit.    Review of Systems:  As per HPI- otherwise negative.   Physical Examination: Vitals:   12/22/22 0848  BP: 112/60  Pulse: 78  Resp: 18  Temp: 97.9 F (36.6 C)  SpO2: 96%   Vitals:   12/22/22 0848  Weight: 116 lb 9.6 oz (52.9 kg)  Height: 5\' 5"  (1.651 m)   Body mass index is 19.4 kg/m. Ideal Body Weight: Weight in (lb) to have BMI = 25: 149.9  GEN: no acute distress.  Normal weight, looks well HEENT: Atraumatic,  Normocephalic.  Bilateral TM wnl, oropharynx normal.  PEERL,EOMI.   Ears and Nose: No external deformity. CV: RRR, No M/G/R. No JVD. No thrill. No extra heart sounds. PULM: CTA B, no wheezes, crackles, rhonchi. No retractions. No resp. distress. No accessory muscle use. ABD: S, NT, ND, +BS. No rebound. No HSM. EXTR: No c/c/e PSYCH: Normally interactive. Conversant.  Clear nail is growing in at the cuticle on all of her toenails Small corn/ plantar wart on right sole    Assessment and Plan: Chronic renal impairment, stage 3a (HCC) - Plan: CBC, Comprehensive metabolic panel  Pre-diabetes - Plan: Hemoglobin A1c  Hyperlipidemia, unspecified hyperlipidemia type - Plan: Lipid panel  Multiple sclerosis (HCC)  Lymphoma of solid organ excluding spleen, unspecified lymphoma type (HCC)  Dilated cardiomyopathy (HCC)  Need for influenza vaccination - Plan: Flu Vaccine Trivalent High Dose (Fluad)  Tinnitus, right - Plan: US Carotid Duplex Bilateral  Bilateral carotid artery stenosis - Plan: US Carotid Duplex Bilateral  Screening for thyroid disorder - Plan: TSH  Onychomycosis  Plantar wart of right foot  Following up today.  Will monitor chronic renal insufficiency with CBC, c-Met Follow-up on diabetes and hyperlipidemia She is being monitored by cardiology and hematology/oncology Given flu shot today She completed terbinafine for onychomycosis, toenails appear to be growing in clear Patient notes she may sometimes hear her heartbeat in her right ear.  This occurs mostly when she is sitting or laying quietly.  She does have a history of mild carotid stenosis seen on ultrasound a few years ago.  Will update ultrasound today.  Advised patient this is normal we may need to look further as far as a pulsatile tinnitus workup with intracranial imaging.  She states understanding Encouraged her to try treating her plantar wart/corn with a salicylic acid pad and she will try this Signed Abbe Amsterdam, MD  Received her labs as below, message to pt  Results for orders placed or performed in visit on 12/22/22  CBC  Result Value Ref Range   WBC 2.9 (L) 4.0 - 10.5 K/uL   RBC 3.79 (L) 3.87 - 5.11 Mil/uL   Platelets 181.0 150.0 - 400.0 K/uL   Hemoglobin 12.0 12.0 - 15.0 g/dL   HCT 09.8 11.9 - 14.7 %   MCV 97.7 78.0 - 100.0 fl   MCHC 32.5 30.0 - 36.0 g/dL   RDW 82.9 56.2 - 13.0 %  Comprehensive metabolic panel  Result Value Ref Range   Sodium 141 135 - 145 mEq/L   Potassium 4.3 3.5 - 5.1 mEq/L   Chloride 104 96 - 112 mEq/L   CO2 30 19 - 32 mEq/L   Glucose, Bld 85 70 - 99 mg/dL   BUN 22 6 - 23 mg/dL   Creatinine, Ser 8.65 0.40 - 1.20 mg/dL   Total Bilirubin 0.7 0.2 - 1.2 mg/dL   Alkaline Phosphatase 43 39 - 117 U/L  AST 30 0 - 37 U/L   ALT 13 0 - 35 U/L   Total Protein 6.9 6.0 - 8.3 g/dL   Albumin 4.0 3.5 - 5.2 g/dL   GFR 16.10 (L) >96.04 mL/min   Calcium 9.3 8.4 - 10.5 mg/dL  Hemoglobin V4U  Result Value Ref Range   Hgb A1c MFr Bld 6.0 4.6 - 6.5 %  Lipid panel  Result Value Ref Range   Cholesterol 86 0 - 200 mg/dL   Triglycerides 981.1 (H) 0.0 - 149.0 mg/dL   HDL 91.47 (L) >82.95 mg/dL   VLDL 62.1 0.0 - 30.8 mg/dL   LDL Cholesterol 19 0 - 99 mg/dL   Total CHOL/HDL Ratio 2    NonHDL 49.48   TSH  Result Value Ref Range   TSH 1.72 0.35 - 5.50 uIU/mL

## 2022-12-16 NOTE — Patient Instructions (Addendum)
Good to see you again today Recommend shingles vaccine series at your pharmacy if not done already.  [facility]  Also recommend COVID booster this fall  It looks like your bone density will be due in October-I will go ahead and order this for you  Flu shot today, I will be in touch with your labs  Try using a corn remover pad for the corn on the bottom of the right foot.  Please let me know if this does not clear up your problem  We will set up carotid ultrasound to check for any blockage in your neck arteries

## 2022-12-22 ENCOUNTER — Ambulatory Visit (INDEPENDENT_AMBULATORY_CARE_PROVIDER_SITE_OTHER): Payer: Medicare HMO | Admitting: Family Medicine

## 2022-12-22 ENCOUNTER — Encounter: Payer: Self-pay | Admitting: Family Medicine

## 2022-12-22 VITALS — BP 112/60 | HR 78 | Temp 97.9°F | Resp 18 | Ht 65.0 in | Wt 116.6 lb

## 2022-12-22 DIAGNOSIS — I6523 Occlusion and stenosis of bilateral carotid arteries: Secondary | ICD-10-CM | POA: Diagnosis not present

## 2022-12-22 DIAGNOSIS — C8599 Non-Hodgkin lymphoma, unspecified, extranodal and solid organ sites: Secondary | ICD-10-CM | POA: Diagnosis not present

## 2022-12-22 DIAGNOSIS — E2839 Other primary ovarian failure: Secondary | ICD-10-CM

## 2022-12-22 DIAGNOSIS — E785 Hyperlipidemia, unspecified: Secondary | ICD-10-CM

## 2022-12-22 DIAGNOSIS — Z1329 Encounter for screening for other suspected endocrine disorder: Secondary | ICD-10-CM | POA: Diagnosis not present

## 2022-12-22 DIAGNOSIS — G35 Multiple sclerosis: Secondary | ICD-10-CM | POA: Diagnosis not present

## 2022-12-22 DIAGNOSIS — B070 Plantar wart: Secondary | ICD-10-CM

## 2022-12-22 DIAGNOSIS — Z23 Encounter for immunization: Secondary | ICD-10-CM | POA: Diagnosis not present

## 2022-12-22 DIAGNOSIS — N1831 Chronic kidney disease, stage 3a: Secondary | ICD-10-CM

## 2022-12-22 DIAGNOSIS — I420 Dilated cardiomyopathy: Secondary | ICD-10-CM

## 2022-12-22 DIAGNOSIS — R7303 Prediabetes: Secondary | ICD-10-CM

## 2022-12-22 DIAGNOSIS — H9311 Tinnitus, right ear: Secondary | ICD-10-CM | POA: Diagnosis not present

## 2022-12-22 DIAGNOSIS — B351 Tinea unguium: Secondary | ICD-10-CM | POA: Diagnosis not present

## 2022-12-22 LAB — COMPREHENSIVE METABOLIC PANEL
ALT: 13 U/L (ref 0–35)
AST: 30 U/L (ref 0–37)
Albumin: 4 g/dL (ref 3.5–5.2)
Alkaline Phosphatase: 43 U/L (ref 39–117)
BUN: 22 mg/dL (ref 6–23)
CO2: 30 meq/L (ref 19–32)
Calcium: 9.3 mg/dL (ref 8.4–10.5)
Chloride: 104 meq/L (ref 96–112)
Creatinine, Ser: 1 mg/dL (ref 0.40–1.20)
GFR: 52.97 mL/min — ABNORMAL LOW (ref >60.00)
Glucose, Bld: 85 mg/dL (ref 70–99)
Potassium: 4.3 meq/L (ref 3.5–5.1)
Sodium: 141 meq/L (ref 135–145)
Total Bilirubin: 0.7 mg/dL (ref 0.2–1.2)
Total Protein: 6.9 g/dL (ref 6.0–8.3)

## 2022-12-22 LAB — CBC
HCT: 37 % (ref 36.0–46.0)
Hemoglobin: 12 g/dL (ref 12.0–15.0)
MCHC: 32.5 g/dL (ref 30.0–36.0)
MCV: 97.7 fl (ref 78.0–100.0)
Platelets: 181 10*3/uL (ref 150.0–400.0)
RBC: 3.79 Mil/uL — ABNORMAL LOW (ref 3.87–5.11)
RDW: 14.2 % (ref 11.5–15.5)
WBC: 2.9 10*3/uL — ABNORMAL LOW (ref 4.0–10.5)

## 2022-12-22 LAB — LIPID PANEL
Cholesterol: 86 mg/dL (ref 0–200)
HDL: 36.3 mg/dL — ABNORMAL LOW (ref >39.00)
LDL Cholesterol: 19 mg/dL (ref 0–99)
NonHDL: 49.48
Total CHOL/HDL Ratio: 2
Triglycerides: 152 mg/dL — ABNORMAL HIGH (ref 0.0–149.0)
VLDL: 30.4 mg/dL (ref 0.0–40.0)

## 2022-12-22 LAB — HEMOGLOBIN A1C: Hgb A1c MFr Bld: 6 % (ref 4.6–6.5)

## 2022-12-22 LAB — TSH: TSH: 1.72 u[IU]/mL (ref 0.35–5.50)

## 2022-12-30 ENCOUNTER — Other Ambulatory Visit (HOSPITAL_BASED_OUTPATIENT_CLINIC_OR_DEPARTMENT_OTHER): Payer: Medicare HMO

## 2023-01-17 ENCOUNTER — Other Ambulatory Visit: Payer: Self-pay | Admitting: Family Medicine

## 2023-01-19 ENCOUNTER — Encounter: Payer: Self-pay | Admitting: Family Medicine

## 2023-01-22 DIAGNOSIS — C83398 Diffuse large b-cell lymphoma of other extranodal and solid organ sites: Secondary | ICD-10-CM | POA: Diagnosis not present

## 2023-01-23 ENCOUNTER — Telehealth: Payer: Self-pay | Admitting: Gastroenterology

## 2023-01-23 ENCOUNTER — Encounter: Payer: Self-pay | Admitting: Gastroenterology

## 2023-01-23 NOTE — Telephone Encounter (Signed)
Good Afternoon Dr. Lavon Paganini,   Patient called stating that she was interested in transferring her care to Dr. Marina Goodell. Patient stated she has a lot of family members that are patients of his and would feel more comfortable to transfer her care. Patient is wanting to schedule an appointment with Dr. Marina Goodell to discuss her issues with Crohns and possible get a colonoscopy.    Will you please review and advise on patient transferring her care?  Thank you.

## 2023-01-27 ENCOUNTER — Ambulatory Visit (HOSPITAL_BASED_OUTPATIENT_CLINIC_OR_DEPARTMENT_OTHER)
Admission: RE | Admit: 2023-01-27 | Discharge: 2023-01-27 | Disposition: A | Discharge status: Home / Self Care | Payer: Medicare HMO | Source: Ambulatory Visit | Attending: Family Medicine | Admitting: Family Medicine

## 2023-01-27 ENCOUNTER — Encounter: Payer: Self-pay | Admitting: Family Medicine

## 2023-01-27 DIAGNOSIS — Z780 Asymptomatic menopausal state: Secondary | ICD-10-CM | POA: Diagnosis not present

## 2023-01-27 DIAGNOSIS — I6523 Occlusion and stenosis of bilateral carotid arteries: Secondary | ICD-10-CM

## 2023-01-27 DIAGNOSIS — E2839 Other primary ovarian failure: Secondary | ICD-10-CM | POA: Insufficient documentation

## 2023-01-27 DIAGNOSIS — H9311 Tinnitus, right ear: Secondary | ICD-10-CM | POA: Insufficient documentation

## 2023-01-27 DIAGNOSIS — M810 Age-related osteoporosis without current pathological fracture: Secondary | ICD-10-CM | POA: Diagnosis not present

## 2023-01-27 DIAGNOSIS — H9319 Tinnitus, unspecified ear: Secondary | ICD-10-CM | POA: Diagnosis not present

## 2023-01-28 ENCOUNTER — Encounter: Payer: Self-pay | Admitting: Family Medicine

## 2023-01-28 NOTE — Telephone Encounter (Signed)
Dr. Marina Goodell,   Are you okay with patient transferring her care to you?  Please advise.

## 2023-01-28 NOTE — Telephone Encounter (Signed)
Its fine

## 2023-01-29 NOTE — Telephone Encounter (Signed)
Reviewed request. She hasn't been seen in 3 years. Unfortunately, my large practice can not accommodate patients established with local GI doctors, but I appreciate her wanting to see me---my practice is just too busy. Dr. Lavon Paganini is excellent, and the patient should continue care with her. Thanks, Dr. Marina Goodell

## 2023-02-02 ENCOUNTER — Other Ambulatory Visit: Payer: Self-pay | Admitting: Neurology

## 2023-02-02 ENCOUNTER — Telehealth: Payer: Self-pay

## 2023-02-02 NOTE — Telephone Encounter (Signed)
Pts daughter called requesting Dr. Marina Goodell take her mother on as a pt. Per previous note Dr. Marina Goodell stated she needed to stay with Dr. Lavon Paganini as his practice is large. Daughter notified via mychart as she had sent a message through her mychart regarding her mother.

## 2023-02-03 NOTE — Telephone Encounter (Signed)
Last seen on 07/29/22 Follow up scheduled on 07/29/23   Dr.Sater I couldn't find that you mentioned pt continuing Gabapentin in any notes?

## 2023-02-19 ENCOUNTER — Encounter: Payer: Self-pay | Admitting: Family Medicine

## 2023-02-19 DIAGNOSIS — M810 Age-related osteoporosis without current pathological fracture: Secondary | ICD-10-CM

## 2023-02-19 DIAGNOSIS — F4321 Adjustment disorder with depressed mood: Secondary | ICD-10-CM

## 2023-03-06 DIAGNOSIS — C83398 Diffuse large b-cell lymphoma of other extranodal and solid organ sites: Secondary | ICD-10-CM | POA: Diagnosis not present

## 2023-03-06 DIAGNOSIS — R911 Solitary pulmonary nodule: Secondary | ICD-10-CM | POA: Diagnosis not present

## 2023-03-16 ENCOUNTER — Encounter: Payer: Self-pay | Admitting: Internal Medicine

## 2023-03-16 ENCOUNTER — Ambulatory Visit: Payer: Medicare HMO | Attending: Internal Medicine | Admitting: Internal Medicine

## 2023-03-16 VITALS — BP 122/80 | HR 76 | Ht 65.5 in | Wt 117.2 lb

## 2023-03-16 DIAGNOSIS — E782 Mixed hyperlipidemia: Secondary | ICD-10-CM | POA: Diagnosis not present

## 2023-03-16 DIAGNOSIS — I2510 Atherosclerotic heart disease of native coronary artery without angina pectoris: Secondary | ICD-10-CM

## 2023-03-16 DIAGNOSIS — I420 Dilated cardiomyopathy: Secondary | ICD-10-CM | POA: Diagnosis not present

## 2023-03-16 DIAGNOSIS — I452 Bifascicular block: Secondary | ICD-10-CM

## 2023-03-16 NOTE — Patient Instructions (Addendum)
Medication Instructions:  Sherryll Burger assistance: - [employer]  - [employer]   The [employer] offers assistance to help pay for medication copays. Our fax # is [phone] (you will need this to apply) Go to healthwellfoundation.org Click on "Apply Now" Answer questions as to whom is applying (patient or representative) Your disease fund will be "Cardiomyopathy - Medicare access" They will ask questions about finances and which medications you are taking for cardiomyopathy When you submit, the approval is usually within minutes.  You will need to print the card information from the site You will need to show this information to your pharmacy, they will bill your Medicare Part D plan first -then bill [employer] Well --for the copay.   You can also call them at [phone], although the hold times can be quite long.   [email or web address]/fund/[email or web address]-[email or web address]-access/   *If you need a refill on your cardiac medications before your next appointment, please call your pharmacy*   Testing/Procedures: Your physician has requested that you have an echocardiogram. Echocardiography is a painless test that uses sound waves to create images of your heart. It provides your doctor with information about the size and shape of your heart and how well your heart's chambers and valves are working. This procedure takes approximately one hour. There are no restrictions for this procedure. Please do NOT wear cologne, perfume, aftershave, or lotions (deodorant is allowed). Please arrive 15 minutes prior to your appointment time.  Please note: We ask at that you not bring children with you during ultrasound (echo/ vascular) testing. Due to room size and safety concerns, children are not allowed in the ultrasound rooms during exams. Our front office staff cannot provide observation of children in our lobby area while testing is being conducted. An  adult accompanying a patient to their appointment will only be allowed in the ultrasound room at the discretion of the ultrasound technician under special circumstances. We apologize for any inconvenience. DUE November/December 2025   Follow-Up: At [facility], you and your health needs are our priority.  As part of our continuing mission to provide you with exceptional heart care, we have created designated Provider Care Teams.  These Care Teams include your primary Cardiologist (physician) and Advanced Practice Providers (APPs -  Physician Assistants and Nurse Practitioners) who all work together to provide you with the care you need, when you need it.  We recommend signing up for the patient portal called "MyChart".  Sign up information is provided on this After Visit Summary.  MyChart is used to connect with patients for Virtual Visits (Telemedicine).  Patients are able to view lab/test results, encounter notes, upcoming appointments, etc.  Non-urgent messages can be sent to your provider as well.   To learn more about what you can do with MyChart, go to [email or web address].    Your next appointment:   12 months with Dr. Rennis Golden -- after echo

## 2023-03-16 NOTE — Progress Notes (Signed)
OFFICE CONSULT NOTE  Chief Complaint:  Follow-up  Primary Care Physician: Copland, Gwenlyn Found, MD  HPI:  [name] is a 82 y.o. female who is being seen today for the evaluation of cardiomyopathy at the request of Copland, Gwenlyn Found, MD. this is a pleasant 82 year old female who wishes to establish care in [place].  She has been seeing Dr. Dulce Sellar in [place] for a dilated cardiomyopathy.  This was diagnosed back in 2001 at which time she was found to have lymphoma and underwent R-CHOP chemotherapy.  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  This time her LVEF went down to 40 to 45%.  Subsequently her EF improved into the 45 to 50% range and then based on her last 2 echoes her EF has been at 50 to 55%.  She reports NYHA class I symptoms.  Surprisingly, however she is on no medications for heart failure.  In fact she only is on 2 lipid-lowering medications.  She reports stroke and heart disease in both parents.  Blood pressure was 120/80 today.  She says in general her blood pressure is low to low normal.  She has not previously tried any heart failure medications.  03/16/2023  [name] returns today for follow-up.  Overall she says she is doing okay.  She denies any chest pain or worsening shortness of breath.  When I last saw her I started her on low-dose Entresto which she is tolerating although sometimes she does get a low blood pressure.  Otherwise she is generally asymptomatic.  Today 122/80.  She is on the low end of weight at 117 pounds.  She does have incomplete right bundle branch block.  Not on any AV nodal blockers.  PMHx:  Past Medical History:  Diagnosis Date   Allergy    Arthritis, degenerative    both knees   B12 deficiency 07/06/2014   Cancer (HCC)    Colitis    Common migraine with intractable migraine 07/12/2020   Diverticulosis    Duodenitis    Eczema    Fatty liver    Gastritis    GERD (gastroesophageal reflux disease)    Heart  murmur    Helicobacter pylori gastritis    Hyperlipidemia    Hypertension    not treated, pt reports "no"   IBD (inflammatory bowel disease)    Lymphoma (HCC)    Memory difficulty 01/05/2013   Memory loss    mild   Multiple sclerosis (HCC)    Osteopenia    Vitamin B12 deficiency     Past Surgical History:  Procedure Laterality Date   ABDOMINAL HYSTERECTOMY  2010   BLADDER SURGERY  2010   Bladder Tack   CERVICAL SPINE SURGERY     CHOLECYSTECTOMY     HEMORRHOID SURGERY     ROTATOR CUFF REPAIR     SPINAL FUSION     TUBAL LIGATION      FAMHx:  Family History  Problem Relation Age of Onset   Colon polyps Mother    Heart failure Mother    Diabetes Father    Heart disease Father    Colon polyps Brother    Hypertension Brother    Colon cancer Maternal Grandmother        ? may be duodenal cancer   Pancreatic cancer Maternal Uncle    Breast cancer Cousin    Esophageal cancer Neg Hx    Rectal cancer Neg Hx    Stomach cancer Neg Hx  SOCHx:   reports that she quit smoking about 25 years ago. Her smoking use included cigarettes. She has been exposed to tobacco smoke. She has never used smokeless tobacco. She reports that she does not drink alcohol and does not use drugs.  ALLERGIES:  Allergies  Allergen Reactions   Sulfa Antibiotics Hives and Nausea And Vomiting   Vytorin [Ezetimibe-Simvastatin]     White skin patch   Aubagio [Teriflunomide]     diarrhea   Cephalexin    Doxycycline     Raw mouth    Erythromycin     REACTION: weakness   Macrobid [Nitrofurantoin Macrocrystal] Hives   Morphine Nausea And Vomiting   Rituxan [Rituximab]     Stated had issue with lungs after her last treatment   Sulfonamide Derivatives Hives and Nausea And Vomiting   Amoxicillin Hives    ROS: Pertinent items noted in HPI and remainder of comprehensive ROS otherwise negative.  HOME MEDS: Current Outpatient Medications on File Prior to Visit  Medication Sig Dispense Refill   b  complex vitamins tablet Take 1 tablet by mouth daily.     cetirizine (ZYRTEC) 10 MG tablet Take 10 mg by mouth daily.     Cholecalciferol (VITAMIN D3) 5000 units TABS Take 5,000 Units by mouth daily.      desonide (DESOWEN) 0.05 % lotion APPLY TOPICALLY 2 (TWO) TIMES DAILY. USE AS NEEDED FOR ITCHY EARS 59 mL 1   fenofibrate (TRICOR) 145 MG tablet TAKE 1 TABLET BY MOUTH EVERY DAY 90 tablet 2   gabapentin (NEURONTIN) 600 MG tablet TAKE 1 TABLET BY MOUTH THREE TIMES A DAY 270 tablet 3   Glucosamine 500 MG TABS Take 1 tablet by mouth 2 (two) times daily.     memantine (NAMENDA) 10 MG tablet TAKE 1 TABLET BY MOUTH TWICE A DAY 180 tablet 3   Multiple Vitamin (MULTIVITAMIN) tablet Take 1 tablet by mouth daily.      rosuvastatin (CRESTOR) 20 MG tablet TAKE 1 TABLET BY MOUTH EVERY DAY 90 tablet 1   sacubitril-valsartan (ENTRESTO) 24-26 MG Take 1 tablet by mouth 2 (two) times daily. 60 tablet 11   traZODone (DESYREL) 50 MG tablet TAKE 1/2 TO 1 TABLET BY MOUTH AT BEDTIME AS NEEDED FOR SLEEP 90 tablet 2   terbinafine (LAMISIL) 250 MG tablet Take 250 mg by mouth daily.     No current facility-administered medications on file prior to visit.    LABS/IMAGING: No results found for this or any previous visit (from the past 48 hour(s)). No results found.  LIPID PANEL:    Component Value Date/Time   CHOL 86 12/22/2022 0932   TRIG 152.0 (H) 12/22/2022 0932   HDL 36.30 (L) 12/22/2022 0932   CHOLHDL 2 12/22/2022 0932   VLDL 30.4 12/22/2022 0932   LDLCALC 19 12/22/2022 0932   LDLCALC 42 01/24/2020 1032   LDLDIRECT 29.0 12/18/2021 0942    WEIGHTS: Wt Readings from Last 3 Encounters:  03/16/23 117 lb 3.2 oz (53.2 kg)  12/22/22 116 lb 9.6 oz (52.9 kg)  09/09/22 118 lb 3.2 oz (53.6 kg)    VITALS: BP 122/80 (BP Location: Left Arm, Patient Position: Sitting, Cuff Size: Normal)   Pulse 76   Ht 5' 5.5" (1.664 m)   Wt 117 lb 3.2 oz (53.2 kg)   SpO2 99%   BMI 19.21 kg/m   EXAM: General appearance:  alert and no distress Neck: no carotid bruit, no JVD, and thyroid not enlarged, symmetric, no tenderness/mass/nodules Lungs: clear to  auscultation bilaterally Heart: regular rate and rhythm, S1, S2 normal, no murmur, click, rub or gallop Abdomen: soft, non-tender; bowel sounds normal; no masses,  no organomegaly Extremities: extremities normal, atraumatic, no cyanosis or edema Pulses: 2+ and symmetric Skin: Skin color, texture, turgor normal. No rashes or lesions Neurologic: Grossly normal Psych: Pleasant  EKG: EKG Interpretation Date/Time:  Monday March 16 2023 08:44:56 EST Ventricular Rate:  75 PR Interval:  256 QRS Duration:  124 QT Interval:  400 QTC Calculation: 446 R Axis:   -59  Text Interpretation: Sinus rhythm with 1st degree A-V block Right bundle branch block Left anterior fascicular block Bifascicular block No significant change since last tracing Confirmed by Zoila Shutter [phone]) on 03/16/2023 8:55:40 AM    ASSESSMENT: History of dilated cardiomyopathy secondary to chemotherapy History of lymphoma in remission after R-CHOP Family history of cardiovascular disease in both parents Dyslipidemia  PLAN: 1.   Ms. Brinson seems to be tolerating low-dose Entresto.  She has a history of UTIs and an SGLT2 inhibitor is not an option for her.  Blood pressure is generally normal but sometimes gets low.  There is a bifascicular block and I would likely avoid AV nodal blocking agent.  I will plan to repeat echo next year and follow-up with me afterwards to see if there is been any improvement in LV function.  Overall she endorses NYHA class I symptoms.  Chrystie Nose, MD, [facility], FACP  [place]  [facility]  Medical Director of the [facility] Lipid [facility] &  Cardiovascular Risk Reduction Clinic Diplomate of the American Board of Clinical Lipidology Attending Cardiologist  Direct Dial: [phone]  Fax: [phone]  Website:  [email or web address]  Sentoria Brent 03/16/2023, 8:55 AM

## 2023-03-21 DIAGNOSIS — R69 Illness, unspecified: Secondary | ICD-10-CM | POA: Diagnosis not present

## 2023-03-23 MED ORDER — FLUOXETINE HCL 20 MG PO CAPS
20.0000 mg | ORAL_CAPSULE | Freq: Every day | ORAL | 3 refills | Status: DC
Start: 2023-03-23 — End: 2023-04-16

## 2023-03-23 MED ORDER — ALENDRONATE SODIUM 70 MG PO TABS
70.0000 mg | ORAL_TABLET | ORAL | 11 refills | Status: DC
Start: 2023-03-23 — End: 2023-06-24

## 2023-03-23 NOTE — Addendum Note (Signed)
Addended by: Abbe Amsterdam C on: 03/23/2023 12:18 PM   Modules accepted: Orders

## 2023-03-23 NOTE — Telephone Encounter (Signed)
Please see the MyChart message reply(ies) for my assessment and plan.  The patient gave consent for this Medical Advice Message and is aware that it may result in a bill to their insurance company as well as the possibility that this may result in a co-payment or deductible. They are an established patient, but are not seeking medical advice exclusively about a problem treated during an in person or video visit in the last 7 days. I did not recommend an in person or video visit within 7 days of my reply.  I spent a total of 18 minutes cumulative time within 7 days through [employer] Abbe Amsterdam, MD

## 2023-04-11 DIAGNOSIS — H524 Presbyopia: Secondary | ICD-10-CM | POA: Diagnosis not present

## 2023-04-16 ENCOUNTER — Other Ambulatory Visit: Payer: Self-pay | Admitting: Family Medicine

## 2023-04-16 DIAGNOSIS — F4321 Adjustment disorder with depressed mood: Secondary | ICD-10-CM

## 2023-05-14 ENCOUNTER — Encounter: Payer: Self-pay | Admitting: Family Medicine

## 2023-05-14 ENCOUNTER — Ambulatory Visit: Payer: Medicare Other | Admitting: Family Medicine

## 2023-05-14 VITALS — BP 112/68 | HR 79 | Temp 97.3°F | Resp 16 | Ht 65.5 in | Wt 118.2 lb

## 2023-05-14 DIAGNOSIS — B351 Tinea unguium: Secondary | ICD-10-CM | POA: Diagnosis not present

## 2023-05-14 DIAGNOSIS — L299 Pruritus, unspecified: Secondary | ICD-10-CM

## 2023-05-14 MED ORDER — CICLOPIROX 8 % EX SOLN
Freq: Every day | CUTANEOUS | 0 refills | Status: DC
Start: 2023-05-14 — End: 2023-07-29

## 2023-05-14 MED ORDER — TRIAMCINOLONE ACETONIDE 0.1 % EX CREA
1.0000 | TOPICAL_CREAM | Freq: Two times a day (BID) | CUTANEOUS | 0 refills | Status: DC
Start: 2023-05-14 — End: 2023-08-03

## 2023-05-14 NOTE — Progress Notes (Signed)
Established Patient Office Visit  Subjective   Patient ID: [name], female    DOB: 20-Sep-1940  Age: 83 y.o. MRN: [id number]  Chief Complaint  Patient presents with   Skin Concern    Pt states having  some imflammaton along her shoulders and lower back. Pt states having pain, itching, burning, no discharge.     HPI Discussed the use of AI scribe software for clinical note transcription with the patient, who gave verbal consent to proceed.  History of Present Illness   The patient presents with an eczema flare-up.  She is experiencing a flare-up of eczema characterized by dry, itchy, and burning skin primarily across the shoulders and below the waistline. The symptoms have persisted for a few months, although the condition appears better today than in recent days. She has been managing the condition with creams and lotions, including Kariva and CeraVe, and has tried using a Cordate type cream on her back.  She takes Zyrtec daily for drainage issues and has used Benadryl at night, which has helped relieve itching. She has not changed soaps or detergents recently and typically uses Tide Clear and Gentle. Occasionally, she uses Cetaphil lotion and cream, with the cream being more effective for itching.  She has experienced stress due to personal circumstances, including separation from her spouse after a long marriage, and has been prescribed fluoxetine for stress but has not yet taken it.  She has a history of nail fungus treated by Dr. Patsy Lager  in March of the previous year with an oral medication, which has grown out halfway. She has not taken the medication recently due to concerns about liver effects.      Patient Active Problem List   Diagnosis Date Noted   Eczema 03/13/2021   Prediabetes 10/30/2020   Common migraine with intractable migraine 07/12/2020   Vitamin B12 deficiency    Memory loss    Lymphoma (HCC)    IBD (inflammatory bowel disease)    Hypertension     Hyperlipidemia    Helicobacter pylori gastritis    Heart murmur    GERD (gastroesophageal reflux disease)    Gastritis    Fatty liver    Diverticulosis    Colitis    Cancer (HCC)    Arthritis, degenerative    Allergy    New onset headache 09/26/2019   Dilated cardiomyopathy (HCC) 03/02/2019   Osteoporosis 12/25/2018   Hypokalemia 02/28/2018   Sepsis (HCC) 02/27/2018   Diverticulitis 02/27/2018   Diffuse large B-cell lymphoma (HCC) 02/27/2018   Osteoarthritis of cervical spine 09/15/2016   Abnormality of gait 11/07/2015   Dizziness and giddiness 11/07/2015   Mild cognitive impairment 01/09/2015   B12 deficiency 07/06/2014   Leukopenia 05/06/2013   Memory difficulty 01/05/2013   Dyslipidemia 11/28/2008   Multiple sclerosis (HCC) 11/28/2008   INFLAMMATORY BOWEL DISEASE 11/28/2008   DIVERTICULOSIS, COLON 11/28/2008   PERSONAL HISTORY OF PEPTIC ULCER DISEASE 11/28/2008   Past Medical History:  Diagnosis Date   Allergy    Arthritis, degenerative    both knees   B12 deficiency 07/06/2014   Cancer (HCC)    Colitis    Common migraine with intractable migraine 07/12/2020   Diverticulosis    Duodenitis    Eczema    Fatty liver    Gastritis    GERD (gastroesophageal reflux disease)    Heart murmur    Helicobacter pylori gastritis    Hyperlipidemia    Hypertension    not treated, pt reports "no"  IBD (inflammatory bowel disease)    Lymphoma (HCC)    Memory difficulty 01/05/2013   Memory loss    mild   Multiple sclerosis (HCC)    Osteopenia    Vitamin B12 deficiency    Past Surgical History:  Procedure Laterality Date   ABDOMINAL HYSTERECTOMY  2010   BLADDER SURGERY  2010   Bladder Tack   CERVICAL SPINE SURGERY     CHOLECYSTECTOMY     HEMORRHOID SURGERY     ROTATOR CUFF REPAIR     SPINAL FUSION     TUBAL LIGATION     Social History   Tobacco Use   Smoking status: Former    Current packs/day: 0.00    Types: Cigarettes    Quit date: 06/27/1997    Years  since quitting: 25.8    Passive exposure: Past   Smokeless tobacco: Never  Vaping Use   Vaping status: Never Used  Substance Use Topics   Alcohol use: No   Drug use: No   Social History   Socioeconomic History   Marital status: Married    Spouse name: Charlie    Number of children: 4   Years of education: 12+   Highest education level: Not on file  Occupational History   Occupation: Retired     Associate Professor: [employer]    Occupation: part-time    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  Tobacco Use   Smoking status: Former    Current packs/day: 0.00    Types: Cigarettes    Quit date: 06/27/1997    Years since quitting: 25.8    Passive exposure: Past   Smokeless tobacco: Never  Vaping Use   Vaping status: Never Used  Substance and Sexual Activity   Alcohol use: No   Drug use: No   Sexual activity: Not Currently  Other Topics Concern   Not on file  Social History Narrative   Left Handed   Drinks 1 cup caffeine daily   Social Drivers of Health   Financial Resource Strain: Low Risk  (01/09/2021)   Overall Financial Resource Strain (CARDIA)    Difficulty of Paying Living Expenses: Not hard at all  Food Insecurity: No Food Insecurity (01/09/2021)   Hunger Vital Sign    Worried About Running Out of Food in the Last Year: Never true    Ran Out of Food in the Last Year: Never true  Transportation Needs: No Transportation Needs (01/09/2021)   PRAPARE - Administrator, Civil Service (Medical): No    Lack of Transportation (Non-Medical): No  Physical Activity: Sufficiently Active (01/09/2021)   Exercise Vital Sign    Days of Exercise per Week: 5 days    Minutes of Exercise per Session: 40 min  Stress: No Stress Concern Present (01/09/2021)   [employer] of Occupational Health - Occupational Stress Questionnaire    Feeling of Stress : Not at all  Social Connections: Socially Integrated (01/09/2021)   Social Connection and Isolation Panel  [NHANES]    Frequency of Communication with Friends and Family: More than three times a week    Frequency of Social Gatherings with Friends and Family: More than three times a week    Attends Religious Services: More than 4 times per year    Active Member of [employer] or Organizations: Yes    Attends Banker Meetings: More than 4 times per year    Marital Status: Married  Catering manager Violence: Not At Risk (01/09/2021)  Humiliation, Afraid, Rape, and Kick questionnaire    Fear of Current or Ex-Partner: No    Emotionally Abused: No    Physically Abused: No    Sexually Abused: No   Family Status  Relation Name Status   Mother  Deceased   Father  Deceased at age 30   Sister  Alive   Brother 2 Alive   MGM  (Not Specified)   Nurse, mental health  (Not Specified)   Cousin  (Not Specified)   Neg Hx  (Not Specified)  No partnership data on file   Family History  Problem Relation Age of Onset   Colon polyps Mother    Heart failure Mother    Diabetes Father    Heart disease Father    Colon polyps Brother    Hypertension Brother    Colon cancer Maternal Grandmother        ? may be duodenal cancer   Pancreatic cancer Maternal Uncle    Breast cancer Cousin    Esophageal cancer Neg Hx    Rectal cancer Neg Hx    Stomach cancer Neg Hx    Allergies  Allergen Reactions   Sulfa Antibiotics Hives and Nausea And Vomiting   Vytorin [Ezetimibe-Simvastatin]     White skin patch   Aubagio [Teriflunomide]     diarrhea   Cephalexin    Doxycycline     Raw mouth    Erythromycin     REACTION: weakness   Macrobid [Nitrofurantoin Macrocrystal] Hives   Morphine Nausea And Vomiting   Rituxan [Rituximab]     Stated had issue with lungs after her last treatment   Sulfonamide Derivatives Hives and Nausea And Vomiting   Amoxicillin Hives      Review of Systems  Constitutional:  Negative for fever and malaise/fatigue.  HENT:  Negative for congestion.   Eyes:  Negative for blurred vision.   Respiratory:  Negative for shortness of breath.   Cardiovascular:  Negative for chest pain, palpitations and leg swelling.  Gastrointestinal:  Negative for abdominal pain, blood in stool and nausea.  Genitourinary:  Negative for dysuria and frequency.  Musculoskeletal:  Negative for falls.  Skin:  Positive for itching. Negative for rash.  Neurological:  Negative for dizziness, loss of consciousness and headaches.  Endo/Heme/Allergies:  Negative for environmental allergies.  Psychiatric/Behavioral:  Negative for depression. The patient is not nervous/anxious.       Objective:     BP 112/68 (BP Location: Left Arm, Patient Position: Sitting, Cuff Size: Normal)   Pulse 79   Temp (!) 97.3 F (36.3 C) (Oral)   Resp 16   Ht 5' 5.5" (1.664 m)   Wt 118 lb 3.2 oz (53.6 kg)   SpO2 98%   BMI 19.37 kg/m  BP Readings from Last 3 Encounters:  05/14/23 112/68  03/16/23 122/80  12/22/22 112/60   Wt Readings from Last 3 Encounters:  05/14/23 118 lb 3.2 oz (53.6 kg)  03/16/23 117 lb 3.2 oz (53.2 kg)  12/22/22 116 lb 9.6 oz (52.9 kg)   SpO2 Readings from Last 3 Encounters:  05/14/23 98%  03/16/23 99%  12/22/22 96%      Physical Exam Vitals and nursing note reviewed.  Constitutional:      General: She is not in acute distress.    Appearance: Normal appearance. She is well-developed.  HENT:     Head: Normocephalic and atraumatic.  Eyes:     General: No scleral icterus.       Right eye:  No discharge.        Left eye: No discharge.  Cardiovascular:     Rate and Rhythm: Normal rate and regular rhythm.     Heart sounds: No murmur heard. Pulmonary:     Effort: Pulmonary effort is normal. No respiratory distress.     Breath sounds: Normal breath sounds.  Musculoskeletal:        General: Normal range of motion.     Cervical back: Normal range of motion and neck supple.     Right lower leg: No edema.     Left lower leg: No edema.  Skin:    General: Skin is warm and dry.      Findings: No erythema, lesion or rash.  Neurological:     Mental Status: She is alert and oriented to person, place, and time.  Psychiatric:        Mood and Affect: Mood normal.        Behavior: Behavior normal.        Thought Content: Thought content normal.        Judgment: Judgment normal.      No results found for any visits on 05/14/23.  Last CBC Lab Results  Component Value Date   WBC 2.9 (L) 12/22/2022   HGB 12.0 12/22/2022   HCT 37.0 12/22/2022   MCV 97.7 12/22/2022   MCH 30.9 02/13/2020   RDW 14.2 12/22/2022   PLT 181.0 12/22/2022   Last metabolic panel Lab Results  Component Value Date   GLUCOSE 85 12/22/2022   NA 141 12/22/2022   K 4.3 12/22/2022   CL 104 12/22/2022   CO2 30 12/22/2022   BUN 22 12/22/2022   CREATININE 1.00 12/22/2022   GFR 52.97 (L) 12/22/2022   CALCIUM 9.3 12/22/2022   PHOS 3.0 02/28/2018   PROT 6.9 12/22/2022   ALBUMIN 4.0 12/22/2022   LABGLOB 2.4 09/22/2022   AGRATIO 1.9 09/22/2022   BILITOT 0.7 12/22/2022   ALKPHOS 43 12/22/2022   AST 30 12/22/2022   ALT 13 12/22/2022   ANIONGAP 11 02/13/2020   Last lipids Lab Results  Component Value Date   CHOL 86 12/22/2022   HDL 36.30 (L) 12/22/2022   LDLCALC 19 12/22/2022   LDLDIRECT 29.0 12/18/2021   TRIG 152.0 (H) 12/22/2022   CHOLHDL 2 12/22/2022   Last hemoglobin A1c Lab Results  Component Value Date   HGBA1C 6.0 12/22/2022   Last thyroid functions Lab Results  Component Value Date   TSH 1.72 12/22/2022   Last vitamin D Lab Results  Component Value Date   VD25OH 69.63 07/15/2021   Last vitamin B12 and Folate Lab Results  Component Value Date   VITAMINB12 559 01/14/2021   FOLATE >24.1 02/23/2019      The ASCVD Risk score (Arnett DK, et al., 2019) failed to calculate for the following reasons:   The 2019 ASCVD risk score is only valid for ages 4 to 65    Assessment & Plan:   Problem List Items Addressed This Visit   None Visit Diagnoses       Itching     -  Primary   Relevant Medications   triamcinolone cream (KENALOG) 0.1 %     Nail fungus       Relevant Medications   ciclopirox (PENLAC) 8 % solution     Assessment and Plan    Eczema Chronic eczema presents with dry, itchy, and inflamed skin on the shoulders and below the waistline. Current management includes over-the-counter creams  like CeraVe and Cetaphil, with occasional use of a Cordate-type cream. Benadryl provides some relief at night. Prescription-strength cortisone cream will be prescribed to mix with CeraVe or Cetaphil. Consider antihistamines such as Claritin or hydroxyzine for itching, noting hydroxyzine may cause drowsiness. Use gentle, unscented laundry detergents like Tide Clear and Gentle or All Free Clear.  Stress and Anxiety Increased stress is noted due to a recent separation from a spouse after a fifty-year marriage. Fluoxetine is prescribed but not yet started due to concerns about dependency and withdrawal. Reassured that fluoxetine is not habit-forming and can be discontinued without withdrawal symptoms. Start fluoxetine at a low dose, taking it in the morning to avoid potential sleep disturbances.  Nail Fungus Nail fungus showed partial improvement after treatment with oral antifungal medication last March. Concerns about hepatotoxicity of oral antifungals were discussed, and a topical antifungal treatment will be prescribed. Apply the paint-on solution daily, removing it with nail polish remover every seven days before reapplying. Inform Dr. Karlyn Agee during the annual dermatology visit if the condition does not improve.  Follow-up Prescriptions will be sent to [facility] on [facility]. Continue using Zyrtec in the morning. Inform Dr. Karlyn Agee about the nail fungus during the annual dermatology visit in June.        Return if symptoms worsen or fail to improve.    Donato Schultz, DO

## 2023-05-14 NOTE — Patient Instructions (Signed)
Pruritus Pruritus is an itchy feeling on the skin. One of the most common causes is dry skin, but many different things can cause itching. Most cases of itching do not require medical attention. Sometimes itchy skin can turn into a rash or a secondary infection. Follow these instructions at home: Skin care  Do not use scented soaps, detergents, perfumes, and cosmetic products. Instead, use gentle, unscented versions of these items. Apply moisturizing creams to your skin frequently, at least twice daily. Apply immediately after bathing while skin is still wet. Take medicines or apply medicated creams only as told by your health care provider. This may include: Corticosteroid cream or topical calcineurin inhibitor. Anti-itch lotions containing urea, camphor, or menthol. Oral antihistamines. Do not take hot showers or baths, which can make itching worse. A short, cool shower may help with itching as long as you apply moisturizing lotion after the shower. Apply a cool, wet cloth (cool compress) to the affected areas. You may take lukewarm baths with one of the following: Epsom salts. You can get these at your local pharmacy or grocery store. Follow the instructions on the packaging. Baking soda. Pour a small amount into the bath as told by your health care provider. Colloidal oatmeal. You can get this at your local pharmacy or grocery store. Follow the instructions on the packaging. Do not scratch your skin. General instructions Avoid wearing tight clothes. Keep a journal to help find out what is causing your itching. Write down: What you eat and drink. What cosmetic products you use. What soaps or detergents you use. What you wear, including jewelry. Use a humidifier. This keeps the air moist, which helps to prevent dry skin. Be aware of any changes in your itchiness. Tell your health care provider about any changes. Contact a health care provider if: The itching does not go away after  several days. You notice redness, warmth, or drainage on the skin where you have scratched. You are unusually thirsty or urinating more than normal. Your skin tingles or feels numb. Your skin or the white parts of your eyes turn yellow (jaundice). You feel weak. You have any of the following: Night sweats. Tiredness (fatigue). Weight loss. Abdominal pain. Summary Pruritus is an itchy feeling on the skin. One of the most common causes is dry skin, but many different conditions and factors can cause itching. Apply moisturizing creams to your skin frequently, at least twice daily. Apply immediately after bathing while skin is still wet. Take medicines or apply medicated creams only as told by your health care provider. Do not take hot showers or baths. Do not use scented soaps, detergents, perfumes, or cosmetic products. Keep a journal to help find out what is causing your itching. This information is not intended to replace advice given to you by your health care provider. Make sure you discuss any questions you have with your health care provider. Document Revised: 05/08/2021 Document Reviewed: 05/08/2021 Elsevier Patient Education  2024 [employer].

## 2023-05-20 ENCOUNTER — Ambulatory Visit: Payer: Medicare HMO | Admitting: Family Medicine

## 2023-05-28 ENCOUNTER — Other Ambulatory Visit: Payer: Self-pay | Admitting: Family Medicine

## 2023-05-28 DIAGNOSIS — E785 Hyperlipidemia, unspecified: Secondary | ICD-10-CM

## 2023-06-12 ENCOUNTER — Other Ambulatory Visit: Payer: Self-pay | Admitting: Family Medicine

## 2023-06-12 DIAGNOSIS — E785 Hyperlipidemia, unspecified: Secondary | ICD-10-CM

## 2023-06-17 NOTE — Patient Instructions (Addendum)
 It was great to see you again today Recommend shingles vaccine series at your pharmacy if not done already- Shingrix  Also recommend COVID booster if none the last 6 months or so It looks like you are due for a mammogram if you wish to have one done  Referral to [facility] practice in [place] for a 2nd opinion/ colonoscopy if you both wish to do so Address: [address], [place], [place] [address] Phone: [phone]  Let's try changing over to [facility] for your bone density; this is taken just once a month Please let me know how this works for you Assuming all is well please see me in about 6 months

## 2023-06-17 NOTE — Progress Notes (Signed)
 [facility] [address], [address] [place], [place] [address] [phone]  Date:  06/24/2023   Name:  [name]   DOB:  [birth year]   MRN:  [id number]  PCP:  Pearline Cables, MD    Chief Complaint: 6 month follow up (Concerns/ questions: 1. Skin issus. 2. Pt is always cold. 3. Looks at toenails again. 4. Fatigue. 5. Lower GI- referral to different Physician. 6. Lower leg issues, related to MS? 7. GI issues after eating. 8. Issues with Fosamax. 9. Tammy suggested a Pna and Td, she wanted to make sure this is okay./AWV due/Mammogram due/Shingrix due)   History of Present Illness:  [name] is a 83 y.o. very pleasant female patient who presents with the following:  Patient seen today for periodic follow-up.  Most recent visit with myself was in September  History of multiple sclerosis, osteopenia, Crohn's disease and diverticulitis, dilated cardiomyopathy, IBD.  She also has lymphoma which is being followed by Dr. Janyth Contes at [facility] She does have mild cognitive impairment, taking Namenda Her husband of many years Virl Diamond started accusing her of multiple affairs (pt states this is totally false) and had moved out-patient reports unfortunately this is still the case.  They are still living apart Her children are supportive  Since our last visit she was seen by cardiology and by oncology in December and November respectively  Cardiology 03/16/2023: ASSESSMENT: History of dilated cardiomyopathy secondary to chemotherapy History of lymphoma in remission after R-CHOP Family history of cardiovascular disease in both parents Dyslipidemia PLAN: 1.   Ms. Galeana seems to be tolerating low-dose Entresto.  She has a history of UTIs and an SGLT2 inhibitor is not an option for her.  Blood pressure is generally normal but sometimes gets low.  There is a bifascicular block and I would likely avoid AV nodal blocking agent.  I will plan to  repeat echo next year and follow-up with me afterwards to see if there is been any improvement in LV function.  Overall she endorses NYHA class I symptoms.  Oncology 03/06/2023: PLAN(S): DLBCL involving kidneys as stage IIBE, dx 06/2017 : s/p 6 mini RCHOP and IT MTX, and IFRT radiation. Last PETCT 11/2018 showed CR.   Her labs are unremarkable. She remains in remission 4+ yrs. ECOG 0 - I reviewed labs and clinical findings with pt/dtr, no concerns for lymphoma recurrence.  - RTC in 6 months with labs, CT chest follow up for lung nodules see below.  # Rituximab induced pneumonitis per CT in 12/2017: resolved.  # Multiple lung nodules / goiter per thyroid US 2020+ PETCT 11/2018. CT chest 03/05/23 showed waxing/waning for lung nodules.  # Hx of smoker 1ppd x 34yrs - will Fu CT chest in 6months.  # Headaches/Migraine / hx of MS: FU by her neuro.  # Autoimmune conditions with Mild UEs Neuropathy with MS hx with Left foot drop hx. She is followed by Neurology and most recent MRI in 09/2019 was stable. She is not on any MS medications.  # Crohn's DZ with chronic mild diarrhea at times.  - To FU with GI. She is to discuss with GI if needing colonoscopy? For her age.  # social family stress (separated from husband). Improving per pt/dtr.  Health maintenance / pt family counseling & education/ coordinator of care: - she said she has covid booster in late 09/24.  - sid she had yearly flu shot received in  09/24.  - Had PCV 13 in 2021, PPSV 23 in 03/ 2020--next in 06/2023.  - said she had RSV vaccine in winter 2023.  - May have shingrix x 2 with PCP.  - Other age appropriate care per PCP and other specialists.  RTC in 6 months with CT chest (For lung nodules), labs, visit.   Mammogram- this is scheduled soon COVID booster- recommended Shingrix- she thinks she had zostavax but never had shingrix which I suggested to her  We got lab work in [employer], lipid, CBC, A1c at 6%, TSH  She notes her  Crohn's may cause symptoms "at times" esp with certain foods.  However she does not wish to do any other treatment at this time.  She would like to get a colonoscopy perhaps- she requests to see a GI doc closer to her home.  We decided on [facility] in [place]   She notes she may have some heartburn after taking her dose of fosamax, sx last for about 3 days each time  Sx are not terrible but I offered to try once monthly Boniva and she would like this    Patient Active Problem List   Diagnosis Date Noted   Eczema 03/13/2021   Prediabetes 10/30/2020   Common migraine with intractable migraine 07/12/2020   Vitamin B12 deficiency    Memory loss    Lymphoma (HCC)    IBD (inflammatory bowel disease)    Hypertension    Hyperlipidemia    Helicobacter pylori gastritis    Heart murmur    GERD (gastroesophageal reflux disease)    Gastritis    Fatty liver    Diverticulosis    Colitis    Cancer (HCC)    Arthritis, degenerative    Allergy    New onset headache 09/26/2019   Dilated cardiomyopathy (HCC) 03/02/2019   Osteoporosis 12/25/2018   Hypokalemia 02/28/2018   Sepsis (HCC) 02/27/2018   Diverticulitis 02/27/2018   Diffuse large B-cell lymphoma (HCC) 02/27/2018   Osteoarthritis of cervical spine 09/15/2016   Abnormality of gait 11/07/2015   Dizziness and giddiness 11/07/2015   Mild cognitive impairment 01/09/2015   B12 deficiency 07/06/2014   Leukopenia 05/06/2013   Memory difficulty 01/05/2013   Dyslipidemia 11/28/2008   Multiple sclerosis (HCC) 11/28/2008   INFLAMMATORY BOWEL DISEASE 11/28/2008   DIVERTICULOSIS, COLON 11/28/2008   PERSONAL HISTORY OF PEPTIC ULCER DISEASE 11/28/2008    Past Medical History:  Diagnosis Date   Allergy    Arthritis, degenerative    both knees   B12 deficiency 07/06/2014   Cancer (HCC)    Colitis    Common migraine with intractable migraine 07/12/2020   Diverticulosis    Duodenitis    Eczema    Fatty liver    Gastritis    GERD  (gastroesophageal reflux disease)    Heart murmur    Helicobacter pylori gastritis    Hyperlipidemia    Hypertension    not treated, pt reports "no"   IBD (inflammatory bowel disease)    Lymphoma (HCC)    Memory difficulty 01/05/2013   Memory loss    mild   Multiple sclerosis (HCC)    Osteopenia    Vitamin B12 deficiency     Past Surgical History:  Procedure Laterality Date   ABDOMINAL HYSTERECTOMY  2010   BLADDER SURGERY  2010   Bladder Tack   CERVICAL SPINE SURGERY     CHOLECYSTECTOMY     HEMORRHOID SURGERY     ROTATOR CUFF REPAIR  SPINAL FUSION     TUBAL LIGATION      Social History   Tobacco Use   Smoking status: Former    Current packs/day: 0.00    Types: Cigarettes    Quit date: 06/27/1997    Years since quitting: 26.0    Passive exposure: Past   Smokeless tobacco: Never  Vaping Use   Vaping status: Never Used  Substance Use Topics   Alcohol use: No   Drug use: No    Family History  Problem Relation Age of Onset   Colon polyps Mother    Heart failure Mother    Diabetes Father    Heart disease Father    Colon polyps Brother    Hypertension Brother    Colon cancer Maternal Grandmother        ? may be duodenal cancer   Pancreatic cancer Maternal Uncle    Breast cancer Cousin    Esophageal cancer Neg Hx    Rectal cancer Neg Hx    Stomach cancer Neg Hx     Allergies  Allergen Reactions   Sulfa Antibiotics Hives and Nausea And Vomiting   Vytorin [Ezetimibe-Simvastatin]     White skin patch   Aubagio [Teriflunomide]     diarrhea   Cephalexin    Doxycycline     Raw mouth    Erythromycin     REACTION: weakness   Macrobid [Nitrofurantoin Macrocrystal] Hives   Morphine Nausea And Vomiting   Rituxan [Rituximab]     Stated had issue with lungs after her last treatment   Sulfonamide Derivatives Hives and Nausea And Vomiting   Amoxicillin Hives    Medication list has been reviewed and updated.  Current Outpatient Medications on File Prior  to Visit  Medication Sig Dispense Refill   b complex vitamins tablet Take 1 tablet by mouth daily.     cetirizine (ZYRTEC) 10 MG tablet Take 10 mg by mouth daily.     Cholecalciferol (VITAMIN D3) 5000 units TABS Take 5,000 Units by mouth daily.      desonide (DESOWEN) 0.05 % lotion APPLY TOPICALLY 2 (TWO) TIMES DAILY. USE AS NEEDED FOR ITCHY EARS 59 mL 1   fenofibrate (TRICOR) 145 MG tablet TAKE 1 TABLET BY MOUTH EVERY DAY 90 tablet 2   gabapentin (NEURONTIN) 600 MG tablet TAKE 1 TABLET BY MOUTH THREE TIMES A DAY 270 tablet 3   Glucosamine 500 MG TABS Take 1 tablet by mouth 2 (two) times daily.     memantine (NAMENDA) 10 MG tablet TAKE 1 TABLET BY MOUTH TWICE A DAY 180 tablet 3   Multiple Vitamin (MULTIVITAMIN) tablet Take 1 tablet by mouth daily.      rosuvastatin (CRESTOR) 20 MG tablet TAKE 1 TABLET BY MOUTH EVERY DAY 90 tablet 1   sacubitril-valsartan (ENTRESTO) 24-26 MG Take 1 tablet by mouth 2 (two) times daily. 60 tablet 11   traZODone (DESYREL) 50 MG tablet TAKE 1/2 TO 1 TABLET BY MOUTH AT BEDTIME AS NEEDED FOR SLEEP 90 tablet 2   triamcinolone cream (KENALOG) 0.1 % Apply 1 Application topically 2 (two) times daily. 30 g 0   ciclopirox (PENLAC) 8 % solution Apply topically at bedtime. Apply over nail and surrounding skin. Apply daily over previous coat. After seven (7) days, may remove with alcohol and continue cycle. (Patient not taking: Reported on 06/24/2023) 6.6 mL 0   FLUoxetine (PROZAC) 20 MG capsule Take 1 capsule (20 mg total) by mouth daily. (Patient not taking: Reported on 06/24/2023) 90  capsule 0   terbinafine (LAMISIL) 250 MG tablet Take 250 mg by mouth daily. (Patient not taking: Reported on 06/24/2023)     No current facility-administered medications on file prior to visit.    Review of Systems:  As per HPI- otherwise negative.   Physical Examination: Vitals:   06/24/23 0858  BP: 122/80  Pulse: 75  Resp: 18  Temp: 98.3 F (36.8 C)  SpO2: 98%   Vitals:    06/24/23 0858  Weight: 118 lb 9.6 oz (53.8 kg)  Height: 5' 5.5" (1.664 m)   Body mass index is 19.44 kg/m. Ideal Body Weight: Weight in (lb) to have BMI = 25: 152.2  GEN: no acute distress.  Thin build, looks well  HEENT: Atraumatic, Normocephalic.  Bilateral TM wnl, oropharynx normal.  PEERL,EOMI.   Ears and Nose: No external deformity. CV: RRR, No M/G/R. No JVD. No thrill. No extra heart sounds. PULM: CTA B, no wheezes, crackles, rhonchi. No retractions. No resp. distress. No accessory muscle use. ABD: S, NT, ND, +BS. No rebound. No HSM. EXTR: No c/c/e PSYCH: Normally interactive. Conversant.   Wt Readings from Last 3 Encounters:  06/24/23 118 lb 9.6 oz (53.8 kg)  05/14/23 118 lb 3.2 oz (53.6 kg)  03/16/23 117 lb 3.2 oz (53.2 kg)    Assessment and Plan: Itching  Age-related osteoporosis without current pathological fracture - Plan: ibandronate (BONIVA) 150 MG tablet  Chronic renal impairment, stage 3a (HCC) - Plan: CBC, Basic metabolic panel  Pre-diabetes - Plan: Hemoglobin A1c  IBD (inflammatory bowel disease) - Plan: Ambulatory referral to Gastroenterology  Pt notes itching of her skin due to dryness.  We hope this will improve with warmer weather She is treating with OTC emollients Try Boniva instead of Fosamax due to temporary side effects after dose Follow-up on labs today Referral to GI at [facility] to establish care for IBD  Signed Abbe Amsterdam, MD  Received labs as below, message to patient Results for orders placed or performed in visit on 06/24/23  CBC   Collection Time: 06/24/23  9:45 AM  Result Value Ref Range   WBC 2.9 (L) 4.0 - 10.5 K/uL   RBC 3.70 (L) 3.87 - 5.11 Mil/uL   Platelets 182.0 150.0 - 400.0 K/uL   Hemoglobin 11.9 (L) 12.0 - 15.0 g/dL   HCT 40.9 81.1 - 91.4 %   MCV 98.0 78.0 - 100.0 fl   MCHC 32.7 30.0 - 36.0 g/dL   RDW 78.2 95.6 - 21.3 %  Basic metabolic panel   Collection Time: 06/24/23  9:45 AM  Result Value Ref Range   Sodium 139  135 - 145 mEq/L   Potassium 4.2 3.5 - 5.1 mEq/L   Chloride 103 96 - 112 mEq/L   CO2 29 19 - 32 mEq/L   Glucose, Bld 86 70 - 99 mg/dL   BUN 33 (H) 6 - 23 mg/dL   Creatinine, Ser 0.86 0.40 - 1.20 mg/dL   GFR 57.84 (L) >69.62 mL/min   Calcium 9.6 8.4 - 10.5 mg/dL  Hemoglobin X5M   Collection Time: 06/24/23  9:45 AM  Result Value Ref Range   Hgb A1c MFr Bld 5.9 4.6 - 6.5 %

## 2023-06-24 ENCOUNTER — Encounter: Payer: Self-pay | Admitting: Family Medicine

## 2023-06-24 ENCOUNTER — Ambulatory Visit (INDEPENDENT_AMBULATORY_CARE_PROVIDER_SITE_OTHER): Payer: Medicare HMO | Admitting: Family Medicine

## 2023-06-24 VITALS — BP 122/80 | HR 75 | Temp 98.3°F | Resp 18 | Ht 65.5 in | Wt 118.6 lb

## 2023-06-24 DIAGNOSIS — K529 Noninfective gastroenteritis and colitis, unspecified: Secondary | ICD-10-CM | POA: Diagnosis not present

## 2023-06-24 DIAGNOSIS — N1831 Chronic kidney disease, stage 3a: Secondary | ICD-10-CM | POA: Diagnosis not present

## 2023-06-24 DIAGNOSIS — L299 Pruritus, unspecified: Secondary | ICD-10-CM | POA: Diagnosis not present

## 2023-06-24 DIAGNOSIS — R7303 Prediabetes: Secondary | ICD-10-CM | POA: Diagnosis not present

## 2023-06-24 DIAGNOSIS — M810 Age-related osteoporosis without current pathological fracture: Secondary | ICD-10-CM

## 2023-06-24 LAB — CBC
HCT: 36.3 % (ref 36.0–46.0)
Hemoglobin: 11.9 g/dL — ABNORMAL LOW (ref 12.0–15.0)
MCHC: 32.7 g/dL (ref 30.0–36.0)
MCV: 98 fl (ref 78.0–100.0)
Platelets: 182 10*3/uL (ref 150.0–400.0)
RBC: 3.7 Mil/uL — ABNORMAL LOW (ref 3.87–5.11)
RDW: 13.7 % (ref 11.5–15.5)
WBC: 2.9 10*3/uL — ABNORMAL LOW (ref 4.0–10.5)

## 2023-06-24 LAB — BASIC METABOLIC PANEL
BUN: 33 mg/dL — ABNORMAL HIGH (ref 6–23)
CO2: 29 meq/L (ref 19–32)
Calcium: 9.6 mg/dL (ref 8.4–10.5)
Chloride: 103 meq/L (ref 96–112)
Creatinine, Ser: 1 mg/dL (ref 0.40–1.20)
GFR: 52.79 mL/min — ABNORMAL LOW (ref >60.00)
Glucose, Bld: 86 mg/dL (ref 70–99)
Potassium: 4.2 meq/L (ref 3.5–5.1)
Sodium: 139 meq/L (ref 135–145)

## 2023-06-24 LAB — HEMOGLOBIN A1C: Hgb A1c MFr Bld: 5.9 % (ref 4.6–6.5)

## 2023-06-24 MED ORDER — IBANDRONATE SODIUM 150 MG PO TABS
150.0000 mg | ORAL_TABLET | ORAL | 3 refills | Status: AC
Start: 2023-06-24 — End: ?

## 2023-07-02 DIAGNOSIS — Z1231 Encounter for screening mammogram for malignant neoplasm of breast: Secondary | ICD-10-CM | POA: Diagnosis not present

## 2023-07-02 LAB — HM MAMMOGRAPHY

## 2023-07-11 ENCOUNTER — Other Ambulatory Visit: Payer: Self-pay | Admitting: Family Medicine

## 2023-07-11 DIAGNOSIS — F4321 Adjustment disorder with depressed mood: Secondary | ICD-10-CM

## 2023-07-24 DIAGNOSIS — K5080 Crohn's disease of both small and large intestine without complications: Secondary | ICD-10-CM | POA: Diagnosis not present

## 2023-07-24 DIAGNOSIS — K635 Polyp of colon: Secondary | ICD-10-CM | POA: Diagnosis not present

## 2023-07-29 ENCOUNTER — Ambulatory Visit: Payer: Medicare HMO | Admitting: Neurology

## 2023-07-29 ENCOUNTER — Encounter: Payer: Self-pay | Admitting: Neurology

## 2023-07-29 VITALS — BP 82/46 | HR 68 | Ht 65.0 in | Wt 118.0 lb

## 2023-07-29 DIAGNOSIS — G43019 Migraine without aura, intractable, without status migrainosus: Secondary | ICD-10-CM | POA: Diagnosis not present

## 2023-07-29 DIAGNOSIS — R269 Unspecified abnormalities of gait and mobility: Secondary | ICD-10-CM

## 2023-07-29 DIAGNOSIS — R413 Other amnesia: Secondary | ICD-10-CM

## 2023-07-29 DIAGNOSIS — Z8572 Personal history of non-Hodgkin lymphomas: Secondary | ICD-10-CM

## 2023-07-29 DIAGNOSIS — G35 Multiple sclerosis: Secondary | ICD-10-CM | POA: Diagnosis not present

## 2023-07-29 DIAGNOSIS — M21372 Foot drop, left foot: Secondary | ICD-10-CM | POA: Diagnosis not present

## 2023-07-29 NOTE — Progress Notes (Signed)
 [facility]  PATIENT: [name] DOB: 01/04/1941  REFERRING DOCTOR OR PCP: Abbe Amsterdam, MD SOURCE: Patient, notes from Dr. Anne Hahn, imaging and lab reports, notes from [facility] oncology, MRI images personally reviewed.  _________________________________   HISTORICAL  CHIEF COMPLAINT:  Chief Complaint  Patient presents with   Follow-up    Pt in 10 alone Pt here for MS f/u Pt states left foot drags on floor at times     HISTORY OF PRESENT ILLNESS:  [name] is a 83 y.o. woman with a history of multiple sclerosis and B-cell lymphoma.  She is transferring care after Dr. Clarisa Kindred retirement.  UPDATE 07/29/2023: Her MS is mostly stable.   She notes a little more left foot drop.   The hand spasms improved but still rare left hand 30 second spasm.    Currently, her main symptoms are mild intermittent left sided weakness and numbness.  She has had some tightness in the left hand.  She is noting more issues with left arm strength if she uses the arm more.  She walks a few times a da and tries to get over a mile.    She can go up  without the rail but uses it going downstairs.  She has some urinary hesitancy.  She has Crohn's disease and has had some fecal incontinence.  She has had hysterectomy.bladder tack and hemorrhoidectomy in the past.   .  Vision is stable.  She notes more fatigue, occurring now most days.  She continues to try to exercise and does yoga x 3-4 days/week..  Denies depression.   She has anxiety and stress with separation from husband.She is on fluoxetine.   She has had some mild cognitive issues and takes memantine.  She does have mild atrophy on the MRI.  She tries to stay cognitively active.   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   She has migraines once a month.   She rarely (every few months)  and takes Advil.  She takes a lorazepam if migraine is more intense.        She stays active and went to [place] in 2023 and [place] in 2024.     She had B cell lymphoma.   She follows up [facility]  regularly.     MS history She was diagnosed with MS in 2000 when she presented with numbness in both hands and left sided numbness   MRI was consistent with MS.   CSF was also c/w MS.     In retrospect, she had.left optic neuritis 1-2 years prior  that corrected itself without treatment.   She was placed on Betaseron initially.   She was placed on Gilenya in 2011.     She had large cell B cell lymphoma.  She was on Rituxan and CHOP and other medications followed by radiation (cancer was in her kidneys.    She sees [facility] (Dr. Regino Schultze) for the cancer and they feel she is in permanent remission.    Her last exacerbation (left foot drop) was in 2013 though    IMAGING: MRI brain 11/02/2020 showed Multiple T2/FLAIR hyperintense foci in the hemispheres and a couple foci in the cerebellum.  These are nonspecific and the majority of the foci have an appearance most consistent with chronic microvascular ischemic change.  Overlap with demyelination is possible.  None of the foci appear to be acute.  They do not enhance.  Compared to the MRI from 2019 2021, there  are no new lesions.      Mild stable generalized cortical atrophy.    MRI of the lumbar spine October 06, 2017 showed a small left disc extrusion at L2-L3 level and mild spinal canal stenosis. There is mild multilevel degenerative disc disease. MRI   REVIEW OF SYSTEMS: Constitutional: No fevers, chills, sweats, or change in appetite Eyes: No visual changes, double vision, eye pain Ear, nose and throat: No hearing loss, ear pain, nasal congestion, sore throat Cardiovascular: No chest pain, palpitations Respiratory:  No shortness of breath at rest or with exertion.   No wheezes GastrointestinaI: No nausea, vomiting, diarrhea, abdominal pain.  Rare ecal incontinence Genitourinary:  No dysuria, urinary retention or frequency.  She has some hesitancy.  No nocturia. Musculoskeletal:  No neck pain, back  pain Integumentary: No rash, pruritus, skin lesions Neurological: as above Psychiatric: No depression at this time.  No anxiety Endocrine: No palpitations, diaphoresis, change in appetite, change in weigh or increased thirst Hematologic/Lymphatic:  No anemia, purpura, petechiae. Allergic/Immunologic: No itchy/runny eyes, nasal congestion, recent allergic reactions, rashes  ALLERGIES: Allergies  Allergen Reactions   Sulfa Antibiotics Hives and Nausea And Vomiting   Vytorin [Ezetimibe-Simvastatin]     White skin patch   Aubagio [Teriflunomide]     diarrhea   Cephalexin    Doxycycline     Raw mouth    Erythromycin     REACTION: weakness   Macrobid [Nitrofurantoin Macrocrystal] Hives   Morphine Nausea And Vomiting   Rituxan [Rituximab]     Stated had issue with lungs after her last treatment   Sulfonamide Derivatives Hives and Nausea And Vomiting   Amoxicillin Hives    HOME MEDICATIONS:  Current Outpatient Medications:    b complex vitamins tablet, Take 1 tablet by mouth daily., Disp: , Rfl:    cetirizine (ZYRTEC) 10 MG tablet, Take 10 mg by mouth daily., Disp: , Rfl:    Cholecalciferol (VITAMIN D3) 5000 units TABS, Take 5,000 Units by mouth daily. , Disp: , Rfl:    desonide (DESOWEN) 0.05 % lotion, APPLY TOPICALLY 2 (TWO) TIMES DAILY. USE AS NEEDED FOR ITCHY EARS, Disp: 59 mL, Rfl: 1   fenofibrate (TRICOR) 145 MG tablet, TAKE 1 TABLET BY MOUTH EVERY DAY, Disp: 90 tablet, Rfl: 2   FLUoxetine (PROZAC) 20 MG capsule, TAKE 1 CAPSULE BY MOUTH EVERY DAY, Disp: 90 capsule, Rfl: 0   gabapentin (NEURONTIN) 600 MG tablet, TAKE 1 TABLET BY MOUTH THREE TIMES A DAY, Disp: 270 tablet, Rfl: 3   Glucosamine 500 MG TABS, Take 1 tablet by mouth 2 (two) times daily., Disp: , Rfl:    ibandronate (BONIVA) 150 MG tablet, Take 1 tablet (150 mg total) by mouth every 30 (thirty) days. Take in the morning with a full glass of water, on an empty stomach, and do not take anything else by mouth or lie down  for the next 30 min., Disp: 3 tablet, Rfl: 3   memantine (NAMENDA) 10 MG tablet, TAKE 1 TABLET BY MOUTH TWICE A DAY, Disp: 180 tablet, Rfl: 3   Multiple Vitamin (MULTIVITAMIN) tablet, Take 1 tablet by mouth daily. , Disp: , Rfl:    rosuvastatin (CRESTOR) 20 MG tablet, TAKE 1 TABLET BY MOUTH EVERY DAY, Disp: 90 tablet, Rfl: 1   sacubitril-valsartan (ENTRESTO) 24-26 MG, Take 1 tablet by mouth 2 (two) times daily., Disp: 60 tablet, Rfl: 11   traZODone (DESYREL) 50 MG tablet, TAKE 1/2 TO 1 TABLET BY MOUTH AT BEDTIME AS NEEDED FOR SLEEP, Disp:  90 tablet, Rfl: 2   triamcinolone cream (KENALOG) 0.1 %, Apply 1 Application topically 2 (two) times daily., Disp: 30 g, Rfl: 0   ciclopirox (PENLAC) 8 % solution, Apply topically at bedtime. Apply over nail and surrounding skin. Apply daily over previous coat. After seven (7) days, may remove with alcohol and continue cycle. (Patient not taking: Reported on 07/29/2023), Disp: 6.6 mL, Rfl: 0   terbinafine (LAMISIL) 250 MG tablet, Take 250 mg by mouth daily. (Patient not taking: Reported on 06/24/2023), Disp: , Rfl:   PAST MEDICAL HISTORY: Past Medical History:  Diagnosis Date   Allergy    Arthritis, degenerative    both knees   B12 deficiency 07/06/2014   Cancer (HCC)    Colitis    Common migraine with intractable migraine 07/12/2020   Diverticulosis    Duodenitis    Eczema    Fatty liver    Gastritis    GERD (gastroesophageal reflux disease)    Heart murmur    Helicobacter pylori gastritis    Hyperlipidemia    Hypertension    not treated, pt reports "no"   IBD (inflammatory bowel disease)    Lymphoma (HCC)    Memory difficulty 01/05/2013   Memory loss    mild   Multiple sclerosis (HCC)    Osteopenia    Vitamin B12 deficiency     PAST SURGICAL HISTORY: Past Surgical History:  Procedure Laterality Date   ABDOMINAL HYSTERECTOMY  2010   BLADDER SURGERY  2010   Bladder Tack   CERVICAL SPINE SURGERY     CHOLECYSTECTOMY     HEMORRHOID  SURGERY     ROTATOR CUFF REPAIR     SPINAL FUSION     TUBAL LIGATION      FAMILY HISTORY: Family History  Problem Relation Age of Onset   Colon polyps Mother    Heart failure Mother    Diabetes Father    Heart disease Father    Colon polyps Brother    Hypertension Brother    Pancreatic cancer Maternal Uncle    Colon cancer Maternal Grandmother        ? 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  Tobacco Use   Smoking status: Former    Current packs/day: 0.00    Types: Cigarettes    Quit date: 06/27/1997    Years since quitting: 26.1    Passive exposure: Past   Smokeless tobacco: Never  Vaping Use   Vaping status: Never Used  Substance and Sexual Activity   Alcohol use: No   Drug use: No   Sexual activity: Not Currently  Other Topics Concern   Not on file  Social History Narrative   Left Handed   Drinks 1 cup caffeine daily   Pt lives alone    Pt works    Social Drivers of Corporate investment banker Strain: Low Risk  (07/23/2023)  Received from [facility]   Overall Financial Resource Strain (CARDIA)    Difficulty of Paying Living Expenses: Not hard at all  Food Insecurity: No Food Insecurity (07/23/2023)   Received from [facility]   Hunger Vital Sign    Worried About Running Out of Food in the Last Year: Never true    Ran Out of Food in the Last Year: Never true  Transportation Needs: No Transportation Needs (07/23/2023)   Received from [facility] -  Transportation    Lack of Transportation (Medical): No    Lack of Transportation (Non-Medical): No  Physical Activity: Sufficiently Active (07/23/2023)   Received from [facility]   Exercise Vital Sign    Days of Exercise per Week: 4 days    Minutes of Exercise per Session: 60 min  Stress: No Stress Concern Present (07/23/2023)   Received from [facility] of Occupational Health - Occupational Stress Questionnaire    Feeling of Stress : Only a little  Recent Concern: Stress - Stress Concern Present (06/23/2023)   [employer] of Occupational Health - Occupational Stress Questionnaire    Feeling of Stress : To some extent  Social Connections: Socially Integrated (07/23/2023)   Received from [facility]   Social Network    How would you rate your social network (family, work, friends)?: Good participation with social networks  Intimate Partner Violence: Not At Risk (07/23/2023)   Received from [facility]   HITS    Over the last 12 months how often did your partner physically hurt you?: Never    Over the last 12 months how often did your partner insult you or talk down to you?: Sometimes    Over the last 12 months how often did your partner threaten you with physical harm?: Never    Over the last 12 months how often did your partner scream or curse at you?: Sometimes     PHYSICAL EXAM  Vitals:   07/29/23 0943  BP: (!) 82/46  Pulse: 68  Weight: 118 lb (53.5 kg)  Height: 5\' 5"  (1.651 m)     Body mass index is 19.64 kg/m.   General: The patient is well-developed and well-nourished and in no acute distress  HEENT:  Head is [place]/AT.  Sclera are anicteric.    Skin: Extremities are without rash or  edema.   NEURO Mental status: The patient is alert and oriented x 3 at the time of the examination. The patient has apparent normal recent and remote memory, with an apparently normal attention span and concentration ability.   Speech is normal.  Cranial  nerves: Extraocular movements are full.  Facial strength and sensation was normal.. No obvious hearing deficits are noted.  Motor:  Muscle bulk is normal.   Tone is normal. Strength is  5 / 5 in all 4 extremities.  Very mild reduced rapid alternating movements in left hand  Sensory: Sensory testing is intact to pinprick, soft touch and vibration sensation in all 4 extremities.  Coordination: Cerebellar testing reveals good finger-nose-finger and heel-to-shin bilaterally.  Gait and station: Station is normal.  Mild left foot drop.  Tandem is close to normal for age. Romberg is negative.   Reflexes: Deep tendon reflexes are symmetric and normal in arms and ankles and mildly increased at the knees.       DIAGNOSTIC DATA (LABS, IMAGING, TESTING) - I reviewed patient records, labs, notes, testing and imaging myself where available.  Lab Results  Component Value Date   WBC 2.9 (L) 06/24/2023   HGB 11.9 (L) 06/24/2023   HCT 36.3 06/24/2023   MCV 98.0 06/24/2023   PLT 182.0 06/24/2023      Component Value Date/Time   NA 139 06/24/2023 0945   NA 143 10/06/2022 1021   NA 141 05/06/2013 0926   K 4.2 06/24/2023 0945   K 3.9 05/06/2013 0926   CL 103 06/24/2023 0945   CO2 29 06/24/2023 0945   CO2 26 05/06/2013 0926   GLUCOSE 86 06/24/2023 0945   GLUCOSE 119 05/06/2013 0926   BUN 33 (H) 06/24/2023 0945   BUN 24 10/06/2022 1021   BUN 21.6 05/06/2013 0926   CREATININE 1.00 06/24/2023 0945   CREATININE 0.8 05/06/2013 0926   CALCIUM 9.6 06/24/2023 0945   CALCIUM 10.0 05/06/2013 0926   PROT 6.9 12/22/2022 0932   PROT 6.9 09/22/2022 1300   PROT 7.7 05/06/2013 0926   ALBUMIN 4.0 12/22/2022 0932   ALBUMIN 4.5 09/22/2022 1300   ALBUMIN 4.1 05/06/2013 0926   AST 30 12/22/2022 0932   AST 24 05/06/2013 0926   ALT 13 12/22/2022 0932   ALT 16 05/06/2013 0926   ALKPHOS 43 12/22/2022 0932   ALKPHOS 49 05/06/2013 0926   BILITOT 0.7 12/22/2022 0932   BILITOT 0.4 09/22/2022 1300   BILITOT  0.58 05/06/2013 0926   GFRNONAA 48 07/05/2020 0000   GFRNONAA 47 (L) 02/13/2020 1719   GFRAA 60 09/26/2019 1354   Lab Results  Component Value Date   CHOL 86 12/22/2022   HDL 36.30 (L) 12/22/2022   LDLCALC 19 12/22/2022   LDLDIRECT 29.0 12/18/2021   TRIG 152.0 (H) 12/22/2022   CHOLHDL 2 12/22/2022   Lab Results  Component Value Date   HGBA1C 5.9 06/24/2023   Lab Results  Component Value Date   VITAMINB12 559 01/14/2021   Lab Results  Component Value Date   TSH 1.72 12/22/2022       ASSESSMENT AND PLAN  Multiple sclerosis (HCC)  Common migraine with intractable migraine  Abnormality of gait  Memory loss  Memory difficulty  Left foot drop  H/O lymphoma     MS is stable off any DMT.  She also may have received some short-term benefit from Rituxan and short and long-term benefit from the CHOP therapy.   A lung nodule is being monitored. If phasic spasms in the left arm worsens we could consider baclofen or Keppra.  She prefers to hold off at this point.  Migraines are now rare but if frequency increases we could consider a prophylactic agent.   Consider left AFO if foot drop worsens.  We discussed safety.   She will return to see us  in 1 year or sooner if there are new or worsening neurologic symptoms.    Neyah Ellerman A. Godwin Lat, MD, [facility] 07/29/2023, 10:25 AM Certified in Neurology, Clinical Neurophysiology, Sleep Medicine and Neuroimaging  [facility] [address] [place], [place] [address] [phone]

## 2023-08-01 ENCOUNTER — Other Ambulatory Visit: Payer: Self-pay | Admitting: Family Medicine

## 2023-08-01 ENCOUNTER — Other Ambulatory Visit: Payer: Self-pay | Admitting: Neurology

## 2023-08-01 DIAGNOSIS — L299 Pruritus, unspecified: Secondary | ICD-10-CM

## 2023-08-01 DIAGNOSIS — F4321 Adjustment disorder with depressed mood: Secondary | ICD-10-CM

## 2023-08-01 DIAGNOSIS — B351 Tinea unguium: Secondary | ICD-10-CM

## 2023-08-03 NOTE — Telephone Encounter (Signed)
 Last seen on 07/29/23 Follow up scheduled on 07/28/24

## 2023-08-20 DIAGNOSIS — K5080 Crohn's disease of both small and large intestine without complications: Secondary | ICD-10-CM | POA: Diagnosis not present

## 2023-09-09 ENCOUNTER — Other Ambulatory Visit: Payer: Self-pay | Admitting: Internal Medicine

## 2023-09-11 DIAGNOSIS — K5000 Crohn's disease of small intestine without complications: Secondary | ICD-10-CM | POA: Diagnosis not present

## 2023-09-11 DIAGNOSIS — K5730 Diverticulosis of large intestine without perforation or abscess without bleeding: Secondary | ICD-10-CM | POA: Diagnosis not present

## 2023-10-09 ENCOUNTER — Other Ambulatory Visit (HOSPITAL_COMMUNITY)
Admission: RE | Admit: 2023-10-09 | Discharge: 2023-10-09 | Disposition: A | Discharge status: Home / Self Care | Source: Ambulatory Visit | Attending: Family Medicine | Admitting: Family Medicine

## 2023-10-09 ENCOUNTER — Ambulatory Visit (INDEPENDENT_AMBULATORY_CARE_PROVIDER_SITE_OTHER): Admitting: Family Medicine

## 2023-10-09 ENCOUNTER — Encounter: Payer: Self-pay | Admitting: Family Medicine

## 2023-10-09 VITALS — BP 111/68 | HR 89 | Ht 65.0 in | Wt 112.0 lb

## 2023-10-09 DIAGNOSIS — N898 Other specified noninflammatory disorders of vagina: Secondary | ICD-10-CM | POA: Diagnosis present

## 2023-10-09 MED ORDER — FLUCONAZOLE 150 MG PO TABS
150.0000 mg | ORAL_TABLET | Freq: Every day | ORAL | 0 refills | Status: DC
Start: 2023-10-09 — End: 2023-12-23

## 2023-10-09 NOTE — Progress Notes (Signed)
   Acute Office Visit  Subjective:     Patient ID: [name], female    DOB: 05-24-1940, 83 y.o.   MRN: [id number]  Chief Complaint  Patient presents with   Vaginal Itching    HPI Patient is in today for vaginal itching.  Discussed the use of AI scribe software for clinical note transcription with the patient, who gave verbal consent to proceed.  History of Present Illness [name] is an 83 year old female who presents with skin and vaginal irritation.  She has been experiencing perineal and vaginal irritation with a sensation of slight swelling in the labia area, which began a day ago. She attributes the symptoms to working outside while wearing a light day pad, potentially causing moisture and heat trapping, exacerbating the irritation.  The symptoms are uncomfortable and itchy, with discomfort primarily external on the skin rather than internal. She has not noticed any discharge and used Monistat 3 the previous night without improvement.  No urinary symptoms such as urgency, frequency, burning, or hematuria are present. She has not observed any rash during showering and is unsure if there is one present.        ROS All review of systems negative except what is listed in the HPI      Objective:    BP 111/68   Pulse 89   Ht 5' 5 (1.651 m)   Wt 112 lb (50.8 kg)   SpO2 100%   BMI 18.64 kg/m    Physical Exam Vitals reviewed.  Constitutional:      General: She is not in acute distress.    Appearance: Normal appearance. She is not ill-appearing.  Genitourinary:    Comments: No rashes, faint erythema to labia; monistat cream noted externally  Neurological:     Mental Status: She is alert and oriented to person, place, and time.   Psychiatric:        Mood and Affect: Mood normal.        Behavior: Behavior normal.        Thought Content: Thought content normal.        Judgment: Judgment normal.       No results found for any visits on  10/09/23.      Assessment & Plan:   Problem List Items Addressed This Visit   None Visit Diagnoses       Vagina itching    -  Primary   Relevant Medications   fluconazole (DIFLUCAN) 150 MG tablet   Other Relevant Orders   Cervicovaginal ancillary only      Assessment & Plan  Vulvovaginal irritation with pruritus and discomfort.  - Perform swab test to rule out infection. Given symptoms are mostly external, will go ahead and send in Diflucan while waiting for swab results.     Meds ordered this encounter  Medications   fluconazole (DIFLUCAN) 150 MG tablet    Sig: Take 1 tablet (150 mg total) by mouth daily. May repeat in 3 days if needed.    Dispense:  2 tablet    Refill:  0    Return if symptoms worsen or fail to improve.  [name] Mon, NP

## 2023-10-12 ENCOUNTER — Ambulatory Visit: Payer: Self-pay | Admitting: Family Medicine

## 2023-10-12 ENCOUNTER — Other Ambulatory Visit (INDEPENDENT_AMBULATORY_CARE_PROVIDER_SITE_OTHER)

## 2023-10-12 ENCOUNTER — Encounter: Payer: Self-pay | Admitting: Family Medicine

## 2023-10-12 ENCOUNTER — Telehealth: Payer: Self-pay

## 2023-10-12 DIAGNOSIS — R300 Dysuria: Secondary | ICD-10-CM

## 2023-10-12 LAB — POCT URINALYSIS DIPSTICK
Bilirubin, UA: NEGATIVE
Blood, UA: NEGATIVE
Glucose, UA: NEGATIVE
Ketones, UA: NEGATIVE
Leukocytes, UA: NEGATIVE
Nitrite, UA: NEGATIVE
Protein, UA: NEGATIVE
Spec Grav, UA: <=1.005 — AB (ref 1.010–1.025)
Urobilinogen, UA: 0.2 U/dL
pH, UA: 6 (ref 5.0–8.0)

## 2023-10-12 LAB — CERVICOVAGINAL ANCILLARY ONLY
Bacterial Vaginitis (gardnerella): NEGATIVE
Candida Glabrata: NEGATIVE
Candida Vaginitis: NEGATIVE
Comment: NEGATIVE
Comment: NEGATIVE
Comment: NEGATIVE

## 2023-10-12 NOTE — Telephone Encounter (Signed)
 Spoke with patient and appt made for the lab for her to come now to leave a sample. She is not having pain with urination, just constant burning in her vaginal area. She saw [facility] and was swabbed late last week. Swab negative. Let her know we would check urine today and let her know from there. Dr. Watt - FYI.

## 2023-10-12 NOTE — Telephone Encounter (Signed)
 Copied from CRM [id number]. Topic: Clinical - Request for Lab/Test Order >> Oct 12, 2023  8:12 AM [name] wrote: Reason for CRM: Pt called requesting A UTI lab test. She was seen previously on 06.27 for burning and vaginal itching. Today she's requesting to be tested for a UTI as well. Contacted [facility] and was advised to to send a CRM for provider for review and order placement

## 2023-10-12 NOTE — Telephone Encounter (Signed)
 Pt has dropped off a urine sample, POC dipstick and urine culture done.

## 2023-10-12 NOTE — Addendum Note (Signed)
 Addended by: WATT RAISIN C on: 10/12/2023 12:51 PM   Modules accepted: Orders

## 2023-10-15 ENCOUNTER — Encounter: Payer: Self-pay | Admitting: Family Medicine

## 2023-10-15 ENCOUNTER — Telehealth: Payer: Self-pay

## 2023-10-15 ENCOUNTER — Other Ambulatory Visit: Payer: Self-pay

## 2023-10-15 DIAGNOSIS — R918 Other nonspecific abnormal finding of lung field: Secondary | ICD-10-CM | POA: Diagnosis not present

## 2023-10-15 DIAGNOSIS — D649 Anemia, unspecified: Secondary | ICD-10-CM | POA: Diagnosis not present

## 2023-10-15 DIAGNOSIS — C83398 Diffuse large b-cell lymphoma of other extranodal and solid organ sites: Secondary | ICD-10-CM | POA: Diagnosis not present

## 2023-10-15 DIAGNOSIS — R911 Solitary pulmonary nodule: Secondary | ICD-10-CM | POA: Diagnosis not present

## 2023-10-15 LAB — URINE CULTURE
MICRO NUMBER:: 16646222
SPECIMEN QUALITY:: ADEQUATE

## 2023-10-15 MED ORDER — FOSFOMYCIN TROMETHAMINE 3 G PO PACK
3.0000 g | PACK | Freq: Once | ORAL | Status: AC
Start: 2023-10-15 — End: ?

## 2023-10-15 MED ORDER — FOSFOMYCIN TROMETHAMINE 3 G PO PACK: 3.0000 g | PACK | Freq: Once | ORAL | 0 refills | Status: AC

## 2023-10-15 NOTE — Telephone Encounter (Signed)
 Copied from CRM [id number]. Topic: Clinical - Prescription Issue >> Oct 15, 2023  4:52 PM Wess RAMAN wrote: Reason for CRM: Patient's current pharmacy is out of stock of fosfomycin (MONUROL) packet 3 g and what would [facility] [phone]  Preferred Pharmacy:  [id number] [place] [place], [place] - [address] [place] [address] Phone: [phone] Fax: [phone] Hours: Not open 24 hours  Callback #: [phone] This encounter was created in error - please disregard.

## 2023-10-15 NOTE — Telephone Encounter (Signed)
 Pt called back and spoke with front office, pt advised she would like Rx for UTI sent to [address]

## 2023-10-15 NOTE — Addendum Note (Signed)
 Addended by: WATT RAISIN C on: 10/15/2023 05:00 PM   Modules accepted: Orders

## 2023-10-15 NOTE — Telephone Encounter (Signed)
 Duplicate request. PCP sent requested Rx to pharmacy requested.

## 2023-10-18 ENCOUNTER — Other Ambulatory Visit: Payer: Self-pay | Admitting: Family Medicine

## 2023-10-18 DIAGNOSIS — E785 Hyperlipidemia, unspecified: Secondary | ICD-10-CM

## 2023-10-18 DIAGNOSIS — B351 Tinea unguium: Secondary | ICD-10-CM

## 2023-10-20 DIAGNOSIS — D2262 Melanocytic nevi of left upper limb, including shoulder: Secondary | ICD-10-CM | POA: Diagnosis not present

## 2023-10-20 DIAGNOSIS — D2271 Melanocytic nevi of right lower limb, including hip: Secondary | ICD-10-CM | POA: Diagnosis not present

## 2023-10-20 DIAGNOSIS — L821 Other seborrheic keratosis: Secondary | ICD-10-CM | POA: Diagnosis not present

## 2023-10-20 DIAGNOSIS — D225 Melanocytic nevi of trunk: Secondary | ICD-10-CM | POA: Diagnosis not present

## 2023-10-20 DIAGNOSIS — D2272 Melanocytic nevi of left lower limb, including hip: Secondary | ICD-10-CM | POA: Diagnosis not present

## 2023-10-20 DIAGNOSIS — D2261 Melanocytic nevi of right upper limb, including shoulder: Secondary | ICD-10-CM | POA: Diagnosis not present

## 2023-11-27 ENCOUNTER — Other Ambulatory Visit: Payer: Self-pay | Admitting: Family Medicine

## 2023-11-27 ENCOUNTER — Telehealth: Payer: Self-pay

## 2023-11-27 ENCOUNTER — Other Ambulatory Visit (HOSPITAL_COMMUNITY): Payer: Self-pay

## 2023-11-27 NOTE — Telephone Encounter (Signed)
 Pharmacy Patient Advocate Encounter   Received notification from RX Request Messages that prior authorization for Desonide  0.05% lotion is required/requested.   Insurance verification completed.   The patient is insured through [facility] .   Per test claim: PA required; PA submitted to above mentioned insurance via Latent Key/confirmation #/EOC [id number] Status is pending

## 2023-11-30 ENCOUNTER — Other Ambulatory Visit: Payer: Self-pay | Admitting: Family Medicine

## 2023-11-30 NOTE — Telephone Encounter (Signed)
 Pharmacy Patient Advocate Encounter  Received notification from [employer] that Prior Authorization for Deonide 0.05% lotion has been DENIED.  See denial reason below. No denial letter attached in CMM. Will attach denial letter to Media tab once received.   PA #/Case ID/Reference #: [id number]

## 2023-12-22 NOTE — Progress Notes (Signed)
 [facility] [address] [place], [place] [address] [phone]  Date:  12/23/2023   Name:  [name]   DOB:  April 21, 1940   MRN:  [id number]  PCP:  Watt Harlene BROCKS, MD    Chief Complaint: No chief complaint on file.   History of Present Illness:  Vicki JIA Holder is a 83 y.o. very pleasant female patient who presents with the following:  Patient seen today for periodic follow-up.  I saw her most recently in March History of multiple sclerosis, osteopenia, Crohn's disease and diverticulitis, dilated cardiomyopathy, IBD.  She also has lymphoma which is being followed by Dr. Zelphia at [facility] She does have mild cognitive impairment, taking Namenda  Her husband of many years Riva started accusing her of multiple affairs (pt states this is totally false) and had moved out-patient reports unfortunately this is still the case.  They are still living apart Her children are supportive   She saw hematology, Dr.Zhou in July-she continues to be in remission IMPRESSION(S): [name] is a 83 y.o. 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 PET/CT after 3 cycles R-CHOP showed treatment response (Deauville 3). On August 8, she completed 6 cycles of R-CHOP chemotherapy. End of treatment PET/CT scan performed on September 12 shows continued complete metabolic response. There were bilateral airspace opacities consistent with rituximab  induced pneumonitis. Shortness of breath has improved with prednisone . In November 2019, she had diverticulitis which resolved. She also developed neutropenia but bone marrow examination on March 25, 2018 was unrevealing. By the end of December 2019, her neutrophil count recovered. She has been in remission since.  PLAN(S): DLBCL involving kidneys as stage IIBE, dx 06/2017 : s/p 6 mini RCHOP and IT MTX, and IFRT radiation.  Last PETCT  11/2018 showed CR.  Her labs are unremarkable except anemia, mostly 2/2 to more active Crohn's dz;  She is in remission >5+ yrs. ECOG 1 - I reviewed labs and clinical findings with pt/dtr, no concerns for lymphoma recurrence.  - since she is in remission > 37yrs, relapse for DLBCL is very low. Her new Anemia findings see below discussion. We will FU in 6-8wks with labs.   She also had a colonoscopy in May-she also has noticed some blood in her stools Her gastroenterologist is Dr. Alm Dames with [facility]  Dr Vear, her neurologist saw her in April-she is currently stable off of medication and they planned follow-up in a year  Most recent cardiology visit with Dr. Mona was in December ASSESSMENT: History of dilated cardiomyopathy secondary to chemotherapy History of lymphoma in remission after R-CHOP Family history of cardiovascular disease in both parents Dyslipidemia PLAN: 1.   [name] seems to be tolerating low-dose Entresto .  She has a history of UTIs and an SGLT2 inhibitor is not an option for her.  Blood pressure is generally normal but sometimes gets low.  There is a bifascicular block and I would likely avoid AV nodal blocking agent.  I will plan to repeat echo next year and follow-up with me afterwards to see if there is been any improvement in LV function.  Overall she endorses NYHA class I symptoms.    -Flu- give today  - COVID can be updated -RSV is complete -Recommend Shingrix if not completed already Mammogram completed in March, DEXA scan last year  I changed her from Fosamax  to monthly Boniva  this past  spring due to some side effects with her weekly medication She does feel like she is generally doing better with monthly Boniva   Discussed the use of AI scribe software for clinical note transcription with the patient, who gave verbal consent to proceed.  History of Present Illness [name] is an 83 year old female who presents with breast discomfort.  She  has been experiencing ongoing discomfort in her left breast, specifically around the nipple area, intermittently since March 2025. Her mammogram in March was normal. Her family history includes a first cousin who died of breast cancer. No new symptoms in the right breast.  She has a history of anemia and has been taking over-the-counter slow iron FE, which she tolerates well. Initially, there was a consideration for iron infusions, but she was later advised to continue with oral iron supplements.  She mentions a prescription for desonide  lotion 0.05% for inflamed ear canals, which she has been using daily. Her insurance suggested alternative medications, but she finds desonide  effective despite its cost.  She notes a decreased appetite and some weight loss over the past several months.  Wt Readings from Last 3 Encounters:  12/23/23 115 lb 3.2 oz (52.3 kg)  10/09/23 112 lb (50.8 kg)  07/29/23 118 lb (53.5 kg)   Her weight a year ago was 116 pounds so looks like she is staying relatively stable over time  She also has a list of labs that Dr. Zhao needs, she wonders if we can draw this for her today.  We are glad to do so  Patient Active Problem List   Diagnosis Date Noted   Eczema 03/13/2021   Prediabetes 10/30/2020   Common migraine with intractable migraine 07/12/2020   Vitamin B12 deficiency    Memory loss    Lymphoma (HCC)    IBD (inflammatory bowel disease)    Hypertension    Hyperlipidemia    Helicobacter pylori gastritis    Heart murmur    GERD (gastroesophageal reflux disease)    Gastritis    Fatty liver    Diverticulosis    Colitis    Cancer (HCC)    Arthritis, degenerative    Allergy    New onset headache 09/26/2019   Dilated cardiomyopathy (HCC) 03/02/2019   Osteoporosis 12/25/2018   Hypokalemia 02/28/2018   Sepsis (HCC) 02/27/2018   Diverticulitis 02/27/2018   Diffuse large B-cell lymphoma (HCC) 02/27/2018   Osteoarthritis of cervical spine 09/15/2016    Abnormality of gait 11/07/2015   Dizziness and giddiness 11/07/2015   Mild cognitive impairment 01/09/2015   B12 deficiency 07/06/2014   Leukopenia 05/06/2013   Memory difficulty 01/05/2013   Dyslipidemia 11/28/2008   Multiple sclerosis (HCC) 11/28/2008   INFLAMMATORY BOWEL DISEASE 11/28/2008   DIVERTICULOSIS, COLON 11/28/2008   PERSONAL HISTORY OF PEPTIC ULCER DISEASE 11/28/2008    Past Medical History:  Diagnosis Date   Allergy    Arthritis, degenerative    both knees   B12 deficiency 07/06/2014   Cancer (HCC)    Colitis    Common migraine with intractable migraine 07/12/2020   Diverticulosis    Duodenitis    Eczema    Fatty liver    Gastritis    GERD (gastroesophageal reflux disease)    Heart murmur    Helicobacter pylori gastritis    Hyperlipidemia    Hypertension    not treated, pt reports no   IBD (inflammatory bowel disease)    Lymphoma (HCC)    Memory difficulty 01/05/2013   Memory  loss    mild   Multiple sclerosis (HCC)    Osteopenia    Vitamin B12 deficiency     Past Surgical History:  Procedure Laterality Date   ABDOMINAL HYSTERECTOMY  2010   BLADDER SURGERY  2010   Bladder Tack   CERVICAL SPINE SURGERY     CHOLECYSTECTOMY     HEMORRHOID SURGERY     ROTATOR CUFF REPAIR     SPINAL FUSION     TUBAL LIGATION      Social History   Tobacco Use   Smoking status: Former    Current packs/day: 0.00    Types: Cigarettes    Quit date: 06/27/1997    Years since quitting: 26.5    Passive exposure: Past   Smokeless tobacco: Never  Vaping Use   Vaping status: Never Used  Substance Use Topics   Alcohol use: No   Drug use: No    Family History  Problem Relation Age of Onset   Colon polyps Mother    Heart failure Mother    Diabetes Father    Heart disease Father    Colon polyps Brother    Hypertension Brother    Pancreatic cancer Maternal Uncle    Colon cancer Maternal Grandmother        ? may be duodenal cancer   Breast cancer Cousin     Multiple sclerosis Cousin    Esophageal cancer Neg Hx    Rectal cancer Neg Hx    Stomach cancer Neg Hx     Allergies  Allergen Reactions   Sulfa Antibiotics Hives and Nausea And Vomiting   Vytorin [Ezetimibe-Simvastatin]     White skin patch   Aubagio [Teriflunomide]     diarrhea   Cephalexin    Doxycycline     Raw mouth    Erythromycin     REACTION: weakness   Macrobid [Nitrofurantoin Macrocrystal] Hives   Morphine Nausea And Vomiting   Rituxan  [Rituximab ]     Stated had issue with lungs after her last treatment   Sulfonamide Derivatives Hives and Nausea And Vomiting   Amoxicillin Hives    Medication list has been reviewed and updated.  Current Outpatient Medications on File Prior to Visit  Medication Sig Dispense Refill   b complex vitamins tablet Take 1 tablet by mouth daily.     cetirizine (ZYRTEC) 10 MG tablet Take 10 mg by mouth daily.     Cholecalciferol (VITAMIN D3) 5000 units TABS Take 5,000 Units by mouth daily.      desonide  (DESOWEN ) 0.05 % lotion APPLY TOPICALLY 2 (TWO) TIMES DAILY. USE AS NEEDED FOR ITCHY EARS 59 mL 1   fenofibrate  (TRICOR ) 145 MG tablet TAKE 1 TABLET BY MOUTH EVERY DAY 90 tablet 2   gabapentin  (NEURONTIN ) 600 MG tablet TAKE 1 TABLET BY MOUTH THREE TIMES A DAY 270 tablet 3   Glucosamine 500 MG TABS Take 1 tablet by mouth 2 (two) times daily.     ibandronate  (BONIVA ) 150 MG tablet Take 1 tablet (150 mg total) by mouth every 30 (thirty) days. Take in the morning with a full glass of water, on an empty stomach, and do not take anything else by mouth or lie down for the next 30 min. 3 tablet 3   memantine  (NAMENDA ) 10 MG tablet TAKE 1 TABLET BY MOUTH TWICE A DAY 180 tablet 3   Multiple Vitamin (MULTIVITAMIN) tablet Take 1 tablet by mouth daily.      rosuvastatin  (CRESTOR ) 20 MG tablet TAKE 1  TABLET BY MOUTH EVERY DAY 90 tablet 1   sacubitril -valsartan  (ENTRESTO ) 24-26 MG TAKE 1 TABLET BY MOUTH TWICE A DAY 60 tablet 4   Current  Facility-Administered Medications on File Prior to Visit  Medication Dose Route Frequency Provider Last Rate Last Admin   fosfomycin  (MONUROL ) packet 3 g  3 g Oral Once         Review of Systems:  As per HPI- otherwise negative.   Physical Examination: Vitals:   12/23/23 0819  BP: 100/67  Pulse: 81   Vitals:   12/23/23 0819  Weight: 115 lb 3.2 oz (52.3 kg)  Height: 5' 5 (1.651 m)   Body mass index is 19.17 kg/m. Ideal Body Weight: Weight in (lb) to have BMI = 25: 149.9  GEN: no acute distress. Slim build, looks well  HEENT: Atraumatic, Normocephalic.  Ears and Nose: No external deformity. CV: RRR, No M/G/R. No JVD. No thrill. No extra heart sounds. PULM: CTA B, no wheezes, crackles, rhonchi. No retractions. No resp. distress. No accessory muscle use. ABD: S, NT, ND, +BS. No rebound. No HSM. EXTR: No c/c/e PSYCH: Normally interactive. Conversant.  Left breast exam nomal  Wt Readings from Last 3 Encounters:  12/23/23 115 lb 3.2 oz (52.3 kg)  10/09/23 112 lb (50.8 kg)  07/29/23 118 lb (53.5 kg)    Assessment and Plan: Hyperlipidemia, unspecified hyperlipidemia type - Plan: Lipid panel  Age-related osteoporosis without current pathological fracture  Chronic renal impairment, stage 3a (HCC) - Plan: Comprehensive metabolic panel with GFR  Adjustment disorder with depressed mood  Thyroid  disorder screening - Plan: TSH  Pre-diabetes - Plan: Hemoglobin A1c  Eczema of both external ears - Plan: betamethasone , augmented, (DIPROLENE ) 0.05 % lotion  Breast pain, left - Plan: MM DIAG BREAST TOMO LT NO CAD, US  BREAST COMPLETE UNI LEFT INC AXILLA  Aplastic anemia (HCC)  Anemia, unspecified type - Plan: CBC w/Diff, Lactate dehydrogenase, Sedimentation rate, Protein Electrophoresis, (serum), Erythropoietin , Folate, B12, Methylmalonic Acid, Serum, Reticulocytes  Need for influenza vaccination - Plan: Flu vaccine HIGH DOSE PF(Fluzone Trivalent)  Assessment &  Plan Osteoporosis without current pathological fracture Switched from Fosamax  to Boniva  with improved tolerance.  Patient notes she may have been given Fosamax  by mistake-I reminded her to check her prescription before she leaves the pharmacy to make sure she has the right thing   Chronic bilateral otitis externa Desonide  0.05% lotion effective for inflammation. Insurance suggested alternatives. - Prescribe betamethasone  lotion as an alternative to desonide  for use in the external ear.  Left breast pain Intermittent pain since March- Further evaluation needed - Order diagnostic mammogram for the left breast at [facility] for detailed evaluation.  Anemia, unspecified Tolerates over-the-counter slow iron FE well. Oral supplementation recommended over iron infusions. - Order CBC with differential. - Consider checking iron levels if indicated.  Unintentional weight loss Reports lack of appetite and weight loss over several months.  General Health Maintenance Due for flu shot and COVID booster. Shingles vaccine status unknown. - Administer flu shot today. - Recommend COVID booster this fall. - Advise checking with pharmacy for shingles vaccine.  Signed Harlene Schroeder, MD  Results for orders placed or performed in visit on 12/23/23  Comprehensive metabolic panel with GFR   Collection Time: 12/23/23  8:45 AM  Result Value Ref Range   Sodium 143 135 - 145 mEq/L   Potassium 4.2 3.5 - 5.1 mEq/L   Chloride 105 96 - 112 mEq/L   CO2 30 19 - 32 mEq/L  Glucose, Bld 78 70 - 99 mg/dL   BUN 33 (H) 6 - 23 mg/dL   Creatinine, Ser 8.84 0.40 - 1.20 mg/dL   Total Bilirubin 0.7 0.2 - 1.2 mg/dL   Alkaline Phosphatase 34 (L) 39 - 117 U/L   AST 31 0 - 37 U/L   ALT 14 0 - 35 U/L   Total Protein 7.4 6.0 - 8.3 g/dL   Albumin 4.2 3.5 - 5.2 g/dL   GFR 55.51 (L) >39.99 mL/min   Calcium  9.9 8.4 - 10.5 mg/dL  Hemoglobin J8r   Collection Time: 12/23/23  8:45 AM  Result Value Ref Range   Hgb A1c MFr Bld  5.9 4.6 - 6.5 %  Lipid panel   Collection Time: 12/23/23  8:45 AM  Result Value Ref Range   Cholesterol 78 0 - 200 mg/dL   Triglycerides 897.9 0.0 - 149.0 mg/dL   HDL 59.79 >60.99 mg/dL   VLDL 79.5 0.0 - 59.9 mg/dL   LDL Cholesterol 18 0 - 99 mg/dL   Total CHOL/HDL Ratio 2    NonHDL 37.92   TSH   Collection Time: 12/23/23  8:45 AM  Result Value Ref Range   TSH 1.95 0.35 - 5.50 uIU/mL  CBC w/Diff   Collection Time: 12/23/23  8:45 AM  Result Value Ref Range   WBC 2.4 Repeated and verified X2. (L) 4.0 - 10.5 K/uL   RBC 3.78 (L) 3.87 - 5.11 Mil/uL   Hemoglobin 11.8 (L) 12.0 - 15.0 g/dL   HCT 64.0 (L) 63.9 - 53.9 %   MCV 95.1 78.0 - 100.0 fl   MCHC 32.7 30.0 - 36.0 g/dL   RDW 84.3 (H) 88.4 - 84.4 %   Platelets 178.0 150.0 - 400.0 K/uL   Neutrophils Relative % 56.8 43.0 - 77.0 %   Lymphocytes Relative 28.6 12.0 - 46.0 %   Monocytes Relative 11.6 3.0 - 12.0 %   Eosinophils Relative 2.5 0.0 - 5.0 %   Basophils Relative 0.5 0.0 - 3.0 %   Neutro Abs 1.3 (L) 1.4 - 7.7 K/uL   Lymphs Abs 0.7 0.7 - 4.0 K/uL   Monocytes Absolute 0.3 0.1 - 1.0 K/uL   Eosinophils Absolute 0.1 0.0 - 0.7 K/uL   Basophils Absolute 0.0 0.0 - 0.1 K/uL  Sedimentation rate   Collection Time: 12/23/23  8:45 AM  Result Value Ref Range   Sed Rate 4 0 - 30 mm/hr  Folate   Collection Time: 12/23/23  8:45 AM  Result Value Ref Range   Folate >22.9 >5.9 ng/mL  B12   Collection Time: 12/23/23  8:45 AM  Result Value Ref Range   Vitamin B-12 405 211 - 911 pg/mL  Reticulocytes   Collection Time: 12/23/23  8:45 AM  Result Value Ref Range   Retic Ct Pct 0.8 %   ABS Retic 30,240 20,000 - 80,000 cells/uL    "

## 2023-12-22 NOTE — Patient Instructions (Incomplete)
 It was great to see you again today, I will be in touch with your blood work I do recommend a COVID booster this fall season as well as the Shingrix vaccine series if not already done- can get at your pharmacy!  Flu shot given today  We will set you up for a diagnostic mammogram and also left breast US  over at [facility]  Assuming all is well please see me in about 6 months

## 2023-12-23 ENCOUNTER — Encounter: Payer: Self-pay | Admitting: Family Medicine

## 2023-12-23 ENCOUNTER — Ambulatory Visit (INDEPENDENT_AMBULATORY_CARE_PROVIDER_SITE_OTHER): Admitting: Family Medicine

## 2023-12-23 VITALS — BP 100/67 | HR 81 | Ht 65.0 in | Wt 115.2 lb

## 2023-12-23 DIAGNOSIS — D619 Aplastic anemia, unspecified: Secondary | ICD-10-CM

## 2023-12-23 DIAGNOSIS — F4321 Adjustment disorder with depressed mood: Secondary | ICD-10-CM

## 2023-12-23 DIAGNOSIS — H60543 Acute eczematoid otitis externa, bilateral: Secondary | ICD-10-CM | POA: Diagnosis not present

## 2023-12-23 DIAGNOSIS — Z1329 Encounter for screening for other suspected endocrine disorder: Secondary | ICD-10-CM

## 2023-12-23 DIAGNOSIS — E785 Hyperlipidemia, unspecified: Secondary | ICD-10-CM

## 2023-12-23 DIAGNOSIS — D649 Anemia, unspecified: Secondary | ICD-10-CM | POA: Diagnosis not present

## 2023-12-23 DIAGNOSIS — N1831 Chronic kidney disease, stage 3a: Secondary | ICD-10-CM

## 2023-12-23 DIAGNOSIS — M810 Age-related osteoporosis without current pathological fracture: Secondary | ICD-10-CM

## 2023-12-23 DIAGNOSIS — R7303 Prediabetes: Secondary | ICD-10-CM

## 2023-12-23 DIAGNOSIS — Z23 Encounter for immunization: Secondary | ICD-10-CM | POA: Diagnosis not present

## 2023-12-23 DIAGNOSIS — N644 Mastodynia: Secondary | ICD-10-CM

## 2023-12-23 LAB — CBC WITH DIFFERENTIAL/PLATELET
Basophils Absolute: 0 K/uL (ref 0.0–0.1)
Basophils Relative: 0.5 % (ref 0.0–3.0)
Eosinophils Absolute: 0.1 K/uL (ref 0.0–0.7)
Eosinophils Relative: 2.5 % (ref 0.0–5.0)
HCT: 35.9 % — ABNORMAL LOW (ref 36.0–46.0)
Hemoglobin: 11.8 g/dL — ABNORMAL LOW (ref 12.0–15.0)
Lymphocytes Relative: 28.6 % (ref 12.0–46.0)
Lymphs Abs: 0.7 K/uL (ref 0.7–4.0)
MCHC: 32.7 g/dL (ref 30.0–36.0)
MCV: 95.1 fl (ref 78.0–100.0)
Monocytes Absolute: 0.3 K/uL (ref 0.1–1.0)
Monocytes Relative: 11.6 % (ref 3.0–12.0)
Neutro Abs: 1.3 K/uL — ABNORMAL LOW (ref 1.4–7.7)
Neutrophils Relative %: 56.8 % (ref 43.0–77.0)
Platelets: 178 K/uL (ref 150.0–400.0)
RBC: 3.78 Mil/uL — ABNORMAL LOW (ref 3.87–5.11)
RDW: 15.6 % — ABNORMAL HIGH (ref 11.5–15.5)
WBC: 2.4 K/uL — ABNORMAL LOW (ref 4.0–10.5)

## 2023-12-23 LAB — COMPREHENSIVE METABOLIC PANEL WITH GFR
ALT: 14 U/L (ref 0–35)
AST: 31 U/L (ref 0–37)
Albumin: 4.2 g/dL (ref 3.5–5.2)
Alkaline Phosphatase: 34 U/L — ABNORMAL LOW (ref 39–117)
BUN: 33 mg/dL — ABNORMAL HIGH (ref 6–23)
CO2: 30 meq/L (ref 19–32)
Calcium: 9.9 mg/dL (ref 8.4–10.5)
Chloride: 105 meq/L (ref 96–112)
Creatinine, Ser: 1.15 mg/dL (ref 0.40–1.20)
GFR: 44.48 mL/min — ABNORMAL LOW (ref >60.00)
Glucose, Bld: 78 mg/dL (ref 70–99)
Potassium: 4.2 meq/L (ref 3.5–5.1)
Sodium: 143 meq/L (ref 135–145)
Total Bilirubin: 0.7 mg/dL (ref 0.2–1.2)
Total Protein: 7.4 g/dL (ref 6.0–8.3)

## 2023-12-23 LAB — HEMOGLOBIN A1C: Hgb A1c MFr Bld: 5.9 % (ref 4.6–6.5)

## 2023-12-23 LAB — LIPID PANEL
Cholesterol: 78 mg/dL (ref 0–200)
HDL: 40.2 mg/dL (ref >39.00)
LDL Cholesterol: 18 mg/dL (ref 0–99)
NonHDL: 37.92
Total CHOL/HDL Ratio: 2
Triglycerides: 102 mg/dL (ref 0.0–149.0)
VLDL: 20.4 mg/dL (ref 0.0–40.0)

## 2023-12-23 LAB — VITAMIN B12: Vitamin B-12: 405 pg/mL (ref 211–911)

## 2023-12-23 LAB — FOLATE: Folate: >22.9 ng/mL (ref >5.9)

## 2023-12-23 LAB — SEDIMENTATION RATE: Sed Rate: 4 mm/h (ref 0–30)

## 2023-12-23 LAB — TSH: TSH: 1.95 u[IU]/mL (ref 0.35–5.50)

## 2023-12-23 MED ORDER — BETAMETHASONE DIPROPIONATE AUG 0.05 % EX LOTN: TOPICAL_LOTION | Freq: Two times a day (BID) | CUTANEOUS | 0 refills | Status: DC

## 2023-12-26 LAB — PROTEIN ELECTROPHORESIS, SERUM
Albumin ELP: 3.9 g/dL (ref 3.8–4.8)
Alpha 1: 0.3 g/dL (ref 0.2–0.3)
Alpha 2: 0.7 g/dL (ref 0.5–0.9)
Beta 2: 0.3 g/dL (ref 0.2–0.5)
Beta Globulin: 0.5 g/dL (ref 0.4–0.6)
Gamma Globulin: 1.2 g/dL (ref 0.8–1.7)
Total Protein: 6.9 g/dL (ref 6.1–8.1)

## 2023-12-26 LAB — ERYTHROPOIETIN: Erythropoietin: 11.4 m[IU]/mL (ref 2.6–18.5)

## 2023-12-26 LAB — METHYLMALONIC ACID, SERUM: Methylmalonic Acid, Quant: 224 nmol/L (ref 85–423)

## 2023-12-26 LAB — RETICULOCYTES
ABS Retic: 30240 {cells}/uL (ref 20000–80000)
Retic Ct Pct: 0.8 %

## 2023-12-27 ENCOUNTER — Other Ambulatory Visit: Payer: Self-pay | Admitting: Family Medicine

## 2023-12-29 ENCOUNTER — Ambulatory Visit: Payer: Self-pay | Admitting: Family Medicine

## 2024-01-01 DIAGNOSIS — D649 Anemia, unspecified: Secondary | ICD-10-CM | POA: Diagnosis not present

## 2024-01-01 DIAGNOSIS — C8518 Unspecified B-cell lymphoma, lymph nodes of multiple sites: Secondary | ICD-10-CM | POA: Diagnosis not present

## 2024-01-08 ENCOUNTER — Encounter: Payer: Self-pay | Admitting: Family Medicine

## 2024-01-08 DIAGNOSIS — R928 Other abnormal and inconclusive findings on diagnostic imaging of breast: Secondary | ICD-10-CM | POA: Diagnosis not present

## 2024-01-08 DIAGNOSIS — N6489 Other specified disorders of breast: Secondary | ICD-10-CM | POA: Diagnosis not present

## 2024-01-08 LAB — HM MAMMOGRAPHY

## 2024-01-12 ENCOUNTER — Other Ambulatory Visit: Payer: Self-pay | Admitting: Internal Medicine

## 2024-01-12 ENCOUNTER — Other Ambulatory Visit: Payer: Self-pay | Admitting: Family Medicine

## 2024-01-12 DIAGNOSIS — H60543 Acute eczematoid otitis externa, bilateral: Secondary | ICD-10-CM

## 2024-02-15 ENCOUNTER — Emergency Department (HOSPITAL_BASED_OUTPATIENT_CLINIC_OR_DEPARTMENT_OTHER)
Admission: EM | Admit: 2024-02-15 | Discharge: 2024-02-15 | Disposition: A | Discharge status: Home / Self Care | Attending: Emergency Medicine | Admitting: Emergency Medicine

## 2024-02-15 ENCOUNTER — Ambulatory Visit: Payer: Self-pay

## 2024-02-15 ENCOUNTER — Emergency Department (HOSPITAL_BASED_OUTPATIENT_CLINIC_OR_DEPARTMENT_OTHER): Admitting: Radiology

## 2024-02-15 ENCOUNTER — Encounter (HOSPITAL_BASED_OUTPATIENT_CLINIC_OR_DEPARTMENT_OTHER): Payer: Self-pay

## 2024-02-15 ENCOUNTER — Other Ambulatory Visit: Payer: Self-pay

## 2024-02-15 DIAGNOSIS — S52572A Other intraarticular fracture of lower end of left radius, initial encounter for closed fracture: Secondary | ICD-10-CM | POA: Insufficient documentation

## 2024-02-15 DIAGNOSIS — W08XXXA Fall from other furniture, initial encounter: Secondary | ICD-10-CM | POA: Diagnosis not present

## 2024-02-15 DIAGNOSIS — Y92019 Unspecified place in single-family (private) house as the place of occurrence of the external cause: Secondary | ICD-10-CM | POA: Insufficient documentation

## 2024-02-15 DIAGNOSIS — Z79899 Other long term (current) drug therapy: Secondary | ICD-10-CM | POA: Insufficient documentation

## 2024-02-15 DIAGNOSIS — I10 Essential (primary) hypertension: Secondary | ICD-10-CM | POA: Diagnosis not present

## 2024-02-15 DIAGNOSIS — S6992XA Unspecified injury of left wrist, hand and finger(s), initial encounter: Secondary | ICD-10-CM | POA: Diagnosis present

## 2024-02-15 MED ORDER — OXYCODONE-ACETAMINOPHEN 5-325 MG PO TABS: 1.0000 | ORAL_TABLET | Freq: Four times a day (QID) | ORAL | 0 refills | Status: AC | PRN

## 2024-02-15 MED ORDER — OXYCODONE-ACETAMINOPHEN 5-325 MG PO TABS
1.0000 | ORAL_TABLET | ORAL | Status: DC | PRN
  Administered 2024-02-15: 1 via ORAL
  Filled 2024-02-15: qty 1

## 2024-02-15 NOTE — ED Provider Notes (Signed)
 [place] [facility]ER[facility]ENC[facility] DEPARTMENT AT [facility] Provider Note   CSN: [id number] Arrival date & time: 02/15/24  [id number]     Patient presents with: Fall and Wrist Injury   [name] is a 83 y.o. female.   Patient is an 83 year old female with a history of MS, duodenitis, hyperlipidemia, GERD and hypertension who is presenting today after a fall at home.  She was cleaning windows and the stool fell causing her to fall on an outstretched arm.  She is having significant pain in her left wrist since that time with some deformity.  She denies hitting her head or loss of consciousness.  She was able to stand and walk after the event and denies any pain in her legs or back.  She does not take any anticoagulation.  The history is provided by the patient.  Fall  Wrist Injury      Prior to Admission medications   Medication Sig Start Date End Date Taking? Authorizing Provider  oxyCODONE-acetaminophen  (PERCOCET/ROXICET) 5-325 MG tablet Take 1 tablet by mouth every 6 (six) hours as needed for severe pain (pain score 7-10). 02/15/24  Yes Doretha Folks, MD  sacubitril -valsartan  (ENTRESTO ) 24-26 MG TAKE 1 TABLET BY MOUTH TWICE A DAY 01/14/24   Hilty, Vinie BROCKS, MD  b complex vitamins tablet Take 1 tablet by mouth daily.    [provider]  betamethasone , augmented, (DIPROLENE ) 0.05 % lotion APPLY TOPICALLY TWICE A DAY 01/13/24   Copland, Jessica C, MD  cetirizine (ZYRTEC) 10 MG tablet Take 10 mg by mouth daily.    [provider]  Cholecalciferol (VITAMIN D3) 5000 units TABS Take 5,000 Units by mouth daily.     [provider]  desonide  (DESOWEN ) 0.05 % lotion APPLY TOPICALLY 2 (TWO) TIMES DAILY. USE AS NEEDED FOR ITCHY EARS 11/27/23   Copland, Harlene BROCKS, MD  fenofibrate  (TRICOR ) 145 MG tablet TAKE 1 TABLET BY MOUTH EVERY DAY 06/12/23   Copland, Harlene BROCKS, MD  gabapentin  (NEURONTIN ) 600 MG tablet TAKE 1 TABLET BY MOUTH THREE TIMES A DAY 02/03/23   Sater, Charlie LABOR, MD  Glucosamine 500 MG TABS Take 1 tablet by mouth 2 (two) times daily.    [provider]  ibandronate  (BONIVA ) 150 MG tablet Take 1 tablet (150 mg total) by mouth every 30 (thirty) days. Take in the morning with a full glass of water, on an empty stomach, and do not take anything else by mouth or lie down for the next 30 min. 06/24/23   Copland, Harlene BROCKS, MD  memantine  (NAMENDA ) 10 MG tablet TAKE 1 TABLET BY MOUTH TWICE A DAY 08/03/23   Sater, Charlie LABOR, MD  Multiple Vitamin (MULTIVITAMIN) tablet Take 1 tablet by mouth daily.     [provider]  rosuvastatin  (CRESTOR ) 20 MG tablet TAKE 1 TABLET BY MOUTH EVERY DAY 10/19/23   Copland, Jessica C, MD    Allergies: Sulfa antibiotics, Vytorin [ezetimibe-simvastatin], Aubagio [teriflunomide], Cephalexin, Doxycycline, Erythromycin, Macrobid [nitrofurantoin macrocrystal], Morphine, Rituxan  [rituximab ], Sulfonamide derivatives, and Amoxicillin    Review of Systems  Updated Vital Signs BP 132/76 (BP Location: Right Arm)   Pulse 84   Temp 98 F (36.7 C)   Resp 16   SpO2 99%   Physical Exam Vitals reviewed.  HENT:     Head: Normocephalic.  Cardiovascular:     Pulses: Normal pulses.  Musculoskeletal:        General: Tenderness and deformity present.     Left elbow: Normal.  Left wrist: Swelling, deformity, tenderness and bony tenderness present. No snuff box tenderness. Decreased range of motion.     Comments: Normal sensation in all 5 fingers of the left hand.  Capillary refill is less than 3 seconds  Skin:    General: Skin is warm.  Neurological:     Mental Status: She is alert. Mental status is at baseline.     (all labs ordered are listed, but only abnormal results are displayed) Labs Reviewed - No data to display  EKG: None  Radiology: DG Wrist Complete Left Result Date: 02/15/2024 CLINICAL DATA:  Clemens, swelling EXAM: LEFT WRIST - COMPLETE 3+ VIEW COMPARISON:  None Available. FINDINGS: Frontal, oblique,  lateral, and ulnar deviated views of the left wrist are obtained. There is a comminuted intra-articular distal left radial fracture, with impaction and slight dorsal angulation at the fracture site. The radiocarpal joint remains intact. Multifocal osteoarthritis greatest at the first carpometacarpal joint. Diffuse soft tissue swelling, greatest dorsally. IMPRESSION: 1. Comminuted intra-articular distal left radial fracture, with impaction and dorsal angulation. 2. Diffuse soft tissue swelling. 3. Osteoarthritis. Electronically Signed   By: Ozell Daring M.D.   On: 02/15/2024 17:39     Procedures   Medications Ordered in the ED  oxyCODONE-acetaminophen  (PERCOCET/ROXICET) 5-325 MG per tablet 1 tablet (1 tablet Oral Given 02/15/24 1649)                                    Medical Decision Making Amount and/or Complexity of Data Reviewed Radiology: ordered and independent interpretation performed. Decision-making details documented in ED Course.  Risk Prescription drug management.   Patient presenting today after a fall with injury to the left wrist.  Deformity noted but neurovascularly intact. I have independently visualized and interpreted pt's images today. Wrist imaging shows a distal radius fracture with concern for intra-articular involvement.  Radiology reports comminuted intra-articular distal left radial fracture with impaction and dorsal angulation.  Discussed all the findings with the patient and her family member.  She will follow-up with hand surgery.  A sugar-tong splint was applied.     Final diagnoses:  Other closed intra-articular fracture of distal end of left radius, initial encounter    ED Discharge Orders          Ordered    oxyCODONE-acetaminophen  (PERCOCET/ROXICET) 5-325 MG tablet  Every 6 hours PRN        02/15/24 1752               Doretha Folks, MD 02/15/24 1755

## 2024-02-15 NOTE — Discharge Instructions (Addendum)
 You need to keep the splint on until you follow-up with the specialist at [facility].  Use the pain medicine as needed and make sure you are elevating your arm

## 2024-02-15 NOTE — Telephone Encounter (Signed)
 FYI Only or Action Required?: FYI only for provider: ED advised.  Patient was last seen in primary care on 12/23/2023 by Copland, Harlene BROCKS, MD.  Called Nurse Triage reporting Wrist Injury.  Symptoms began today.  Interventions attempted: Ice/heat application.  Symptoms are: unchanged.  Triage Disposition: Go to ED Now (Notify PCP)  Patient/caregiver understands and will follow disposition?: Yes   Copied from CRM [id number]. Topic: Clinical - Red Word Triage >> Feb 15, 2024  3:50 PM Nessti S wrote: [facility] that prompted transfer to Nurse Triage: fell off from standing on a bench and hurt her wrist; having pain Reason for Disposition  Sounds like a serious injury to the triager  Answer Assessment - Initial Assessment Questions Call was dropped prior to triage. RN called back and spoke with patient and daughter. Pt feel off of a bench and landed on her hand flat out in front of her. She states she immediately but ice on it but she is in severe pain. Rn advised given the way she fell would go to  ER for evaluation. Pt agreed.    1. MECHANISM: How did the injury happen?     Clemens off a bench 2. ONSET: When did the injury happen? (e.g., minutes or hours ago)      About 10 minutes prior to calling 3. APPEARANCE of INJURY: What does the injury look like?      swollen 4. SEVERITY: Can you use your wrist normally? Can you move your wrist back and forth? Can you hold something in your hand?     No, very painful just to wiggle fingers 5. SIZE: For cuts, bruises, or swelling, ask: How large is it? (e.g., inches or centimeters; entire wrist)      none 6. PAIN: How bad is the pain? (Scale 0-10; or none, mild, moderate, severe)     severe  8. OTHER SYMPTOMS: Do you have any other symptoms?      no  Protocols used: Wrist Injury-A-AH

## 2024-02-15 NOTE — ED Triage Notes (Signed)
 Patient fell and put out her left wrist to break her fall. She says that she put ice on it immediately. It is swollen in triage, sensation intact, good cap refill. No head strike, no anticoagulants.

## 2024-02-16 NOTE — Telephone Encounter (Signed)
 Pt seen at ED

## 2024-03-21 ENCOUNTER — Ambulatory Visit (HOSPITAL_COMMUNITY): Admission: RE | Admit: 2024-03-21 | Discharge: 2024-03-21 | Attending: Internal Medicine | Admitting: Internal Medicine

## 2024-03-21 DIAGNOSIS — I420 Dilated cardiomyopathy: Secondary | ICD-10-CM

## 2024-03-21 DIAGNOSIS — I428 Other cardiomyopathies: Secondary | ICD-10-CM

## 2024-03-21 LAB — ECHOCARDIOGRAM COMPLETE
Area-P 1/2: 8.43 cm2
P 1/2 time: 275 ms
S' Lateral: 2.5 cm

## 2024-03-22 ENCOUNTER — Ambulatory Visit: Payer: Self-pay | Admitting: Internal Medicine

## 2024-03-25 ENCOUNTER — Encounter: Payer: Self-pay | Admitting: Internal Medicine

## 2024-03-25 ENCOUNTER — Ambulatory Visit: Attending: Internal Medicine | Admitting: Internal Medicine

## 2024-03-25 VITALS — BP 84/60 | HR 82 | Ht 65.0 in | Wt 119.0 lb

## 2024-03-25 DIAGNOSIS — I452 Bifascicular block: Secondary | ICD-10-CM | POA: Diagnosis not present

## 2024-03-25 DIAGNOSIS — E782 Mixed hyperlipidemia: Secondary | ICD-10-CM

## 2024-03-25 DIAGNOSIS — I2510 Atherosclerotic heart disease of native coronary artery without angina pectoris: Secondary | ICD-10-CM | POA: Diagnosis not present

## 2024-03-25 DIAGNOSIS — I420 Dilated cardiomyopathy: Secondary | ICD-10-CM | POA: Diagnosis not present

## 2024-03-25 MED ORDER — FENOFIBRATE 54 MG PO TABS: 54.0000 mg | ORAL_TABLET | Freq: Every day | ORAL | 3 refills | Status: AC

## 2024-03-25 NOTE — Progress Notes (Signed)
 OFFICE CONSULT NOTE  Chief Complaint:  Follow-up  Primary Care Physician: Copland, Harlene BROCKS, MD  HPI:  [name] is a 83 y.o. female who is being seen today for the evaluation of cardiomyopathy at the request of Copland, Harlene BROCKS, MD. this is a pleasant 83 year old female who wishes to establish care in [place].  She has been seeing Dr. Monetta in [place] for a dilated cardiomyopathy.  This was diagnosed back in 2001 at which time she was found to have lymphoma and underwent R-CHOP chemotherapy.  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  This time her LVEF went down to 40 to 45%.  Subsequently her EF improved into the 45 to 50% range and then based on her last 2 echoes her EF has been at 50 to 55%.  She reports NYHA class I symptoms.  Surprisingly, however she is on no medications for heart failure.  In fact she only is on 2 lipid-lowering medications.  She reports stroke and heart disease in both parents.  Blood pressure was 120/80 today.  She says in general her blood pressure is low to low normal.  She has not previously tried any heart failure medications.  03/16/2023  [name] returns today for follow-up.  Overall she says she is doing okay.  She denies any chest pain or worsening shortness of breath.  When I last saw her I started her on low-dose Entresto  which she is tolerating although sometimes she does get a low blood pressure.  Otherwise she is generally asymptomatic.  Today 122/80.  She is on the low end of weight at 117 pounds.  She does have incomplete right bundle branch block.  Not on any AV nodal blockers.  03/25/2024  [name] is here today for follow-up.  Overall she is feeling pretty well.  She has noted some fatigue recently.  Blood pressure today was low at 84/60.  She says recently she has noted lower blood pressures particular when she goes to yoga and at other times.  Home blood pressures have been in the 90s and low 100s.  She had a  recent echo blood pressure was 100 systolic.  Fortunately echo shows her LVEF is normalized up to 60 to 65%.  Her only blood pressure lowering medicine is Entresto .  Also of note she has chronic kidney disease because of history of kidney cancer.  Her GFR is 44 but she is noted to be on fenofibrate  145 mg daily which is too high of a dose for that degree of renal dysfunction.  PMHx:  Past Medical History:  Diagnosis Date   Allergy    Arthritis, degenerative    both knees   B12 deficiency 07/06/2014   Cancer (HCC)    Colitis    Common migraine with intractable migraine 07/12/2020   Diverticulosis    Duodenitis    Eczema    Fatty liver    Gastritis    GERD (gastroesophageal reflux disease)    Heart murmur    Helicobacter pylori gastritis    Hyperlipidemia    Hypertension    not treated, pt reports no   IBD (inflammatory bowel disease)    Lymphoma (HCC)    Memory difficulty 01/05/2013   Memory loss    mild   Multiple sclerosis    Osteopenia    Vitamin B12 deficiency     Past Surgical History:  Procedure Laterality Date   ABDOMINAL HYSTERECTOMY  2010   BLADDER SURGERY  2010  Bladder Tack   CERVICAL SPINE SURGERY     CHOLECYSTECTOMY     HEMORRHOID SURGERY     ROTATOR CUFF REPAIR     SPINAL FUSION     TUBAL LIGATION      FAMHx:  Family History  Problem Relation Age of Onset   Colon polyps Mother    Heart failure Mother    Diabetes Father    Heart disease Father    Colon polyps Brother    Hypertension Brother    Pancreatic cancer Maternal Uncle    Colon cancer Maternal Grandmother        ? may be duodenal cancer   Breast cancer Cousin    Multiple sclerosis Cousin    Esophageal cancer Neg Hx    Rectal cancer Neg Hx    Stomach cancer Neg Hx     SOCHx:   reports that she quit smoking about 26 years ago. Her smoking use included cigarettes. She has been exposed to tobacco smoke. She has never used smokeless tobacco. She reports that she does not drink alcohol  and does not use drugs.  ALLERGIES:  Allergies  Allergen Reactions   Sulfa Antibiotics Hives and Nausea And Vomiting   Vytorin [Ezetimibe-Simvastatin]     White skin patch   Aubagio [Teriflunomide]     diarrhea   Cephalexin    Doxycycline     Raw mouth    Erythromycin     REACTION: weakness   Macrobid [Nitrofurantoin Macrocrystal] Hives   Morphine Nausea And Vomiting   Rituxan  [Rituximab ]     Stated had issue with lungs after her last treatment   Sulfonamide Derivatives Hives and Nausea And Vomiting   Amoxicillin Hives    ROS: Pertinent items noted in HPI and remainder of comprehensive ROS otherwise negative.  HOME MEDS: Current Outpatient Medications on File Prior to Visit  Medication Sig Dispense Refill   sacubitril -valsartan  (ENTRESTO ) 24-26 MG TAKE 1 TABLET BY MOUTH TWICE A DAY 180 tablet 0   b complex vitamins tablet Take 1 tablet by mouth daily.     betamethasone , augmented, (DIPROLENE ) 0.05 % lotion APPLY TOPICALLY TWICE A DAY 30 mL 0   cetirizine (ZYRTEC) 10 MG tablet Take 10 mg by mouth daily.     Cholecalciferol (VITAMIN D3) 5000 units TABS Take 5,000 Units by mouth daily.      desonide  (DESOWEN ) 0.05 % lotion APPLY TOPICALLY 2 (TWO) TIMES DAILY. USE AS NEEDED FOR ITCHY EARS (Patient not taking: Reported on 03/25/2024) 59 mL 1   fenofibrate  (TRICOR ) 145 MG tablet TAKE 1 TABLET BY MOUTH EVERY DAY 90 tablet 2   gabapentin  (NEURONTIN ) 600 MG tablet TAKE 1 TABLET BY MOUTH THREE TIMES A DAY 270 tablet 3   Glucosamine 500 MG TABS Take 1 tablet by mouth 2 (two) times daily.     ibandronate  (BONIVA ) 150 MG tablet Take 1 tablet (150 mg total) by mouth every 30 (thirty) days. Take in the morning with a full glass of water, on an empty stomach, and do not take anything else by mouth or lie down for the next 30 min. 3 tablet 3   memantine  (NAMENDA ) 10 MG tablet TAKE 1 TABLET BY MOUTH TWICE A DAY 180 tablet 3   Multiple Vitamin (MULTIVITAMIN) tablet Take 1 tablet by mouth daily.       oxyCODONE -acetaminophen  (PERCOCET/ROXICET) 5-325 MG tablet Take 1 tablet by mouth every 6 (six) hours as needed for severe pain (pain score 7-10). (Patient not taking: Reported on 03/25/2024)  6 tablet 0   rosuvastatin  (CRESTOR ) 20 MG tablet TAKE 1 TABLET BY MOUTH EVERY DAY 90 tablet 1   Current Facility-Administered Medications on File Prior to Visit  Medication Dose Route Frequency Provider Last Rate Last Admin   fosfomycin (MONUROL ) packet 3 g  3 g Oral Once         LABS/IMAGING: No results found for this or any previous visit (from the past 48 hours). No results found.  LIPID PANEL:    Component Value Date/Time   CHOL 78 12/23/2023 0845   TRIG 102.0 12/23/2023 0845   HDL 40.20 12/23/2023 0845   CHOLHDL 2 12/23/2023 0845   VLDL 20.4 12/23/2023 0845   LDLCALC 18 12/23/2023 0845   LDLCALC 42 01/24/2020 1032   LDLDIRECT 29.0 12/18/2021 0942    WEIGHTS: Wt Readings from Last 3 Encounters:  03/25/24 119 lb (54 kg)  12/23/23 115 lb 3.2 oz (52.3 kg)  10/09/23 112 lb (50.8 kg)    VITALS: BP (!) 84/60   Pulse 82   Ht 5' 5 (1.651 m)   Wt 119 lb (54 kg)   SpO2 96%   BMI 19.80 kg/m   EXAM: General appearance: alert and no distress Neck: no carotid bruit, no JVD, and thyroid  not enlarged, symmetric, no tenderness/mass/nodules Lungs: clear to auscultation bilaterally Heart: regular rate and rhythm, S1, S2 normal, no murmur, click, rub or gallop Abdomen: soft, non-tender; bowel sounds normal; no masses,  no organomegaly Extremities: extremities normal, atraumatic, no cyanosis or edema Pulses: 2+ and symmetric Skin: Skin color, texture, turgor normal. No rashes or lesions Neurologic: Grossly normal Psych: Pleasant  EKG: EKG Interpretation Date/Time:  Friday March 25 2024 08:34:32 EST Ventricular Rate:  82 PR Interval:  232 QRS Duration:  124 QT Interval:  372 QTC Calculation: 434 R Axis:   -49  Text Interpretation: Sinus rhythm with 1st degree A-V block  Possible Left atrial enlargement Right bundle branch block Left anterior fascicular block Bifascicular block When compared with ECG of 16-Mar-2023 08:44, No significant change was found Confirmed by Mona Kent [phone]) on 03/25/2024 8:43:19 AM    ASSESSMENT: History of dilated cardiomyopathy secondary to chemotherapy -> LVEF 40 to 45%-improved to 60 to 65% (03/2024) History of lymphoma in remission after R-CHOP Family history of cardiovascular disease in both parents Hypertriglyceridemia  PLAN: 1.   [name] has had improvement in LVEF but has been hypotensive and noted that she has been fatigued.  I would advise stopping her Entresto  since she is not on any other blood pressure lowering meds.  She should also remain hydrated.  Have encouraged her to monitor blood pressures at home over the next week and report them to me.  I also will decrease her fenofibrate  from 145 to 54 mg daily due to decreased GFR as per guidelines for reduced renal function.  Higher levels of fenofibrate  could lead to elevations in creatinine giving some degree of falsely worsened kidney function.  She has follow-up with her PCP who could recheck her lipids in March.  Plan otherwise follow-up with me annually or sooner as necessary.  Kent KYM Mona, MD, [facility], FNLA, FACP  [place]  [facility]  Medical Director of the Advanced Lipid [facility] &  Cardiovascular Risk Reduction Clinic Diplomate of the American Board of Clinical Lipidology Attending Cardiologist  Direct Dial: [phone]  Fax: [phone]  Website:  [email or web address] Kent JAYSON Mona 03/25/2024, 8:43 AM

## 2024-03-25 NOTE — Patient Instructions (Signed)
 Medication Instructions:   DECREASE fenofibrate  to 54mg  daily   STOP entresto    *If you need a refill on your cardiac medications before your next appointment, please call your pharmacy*  Follow-Up: At [facility], you and your health needs are our priority.  As part of our continuing mission to provide you with exceptional heart care, our providers are all part of one team.  This team includes your primary Cardiologist (physician) and Advanced Practice Providers or APPs (Physician Assistants and Nurse Practitioners) who all work together to provide you with the care you need, when you need it.  Your next appointment:    12 months with Dr. Mona  We recommend signing up for the patient portal called MyChart.  Sign up information is provided on this After Visit Summary.  MyChart is used to connect with patients for Virtual Visits (Telemedicine).  Patients are able to view lab/test results, encounter notes, upcoming appointments, etc.  Non-urgent messages can be sent to your provider as well.   To learn more about what you can do with MyChart, go to [email or web address].   Other Instructions

## 2024-04-10 ENCOUNTER — Encounter: Payer: Self-pay | Admitting: Family Medicine

## 2024-04-10 ENCOUNTER — Other Ambulatory Visit: Payer: Self-pay | Admitting: Neurology

## 2024-04-12 NOTE — Telephone Encounter (Signed)
 Last OV 07/29/23 Next OV 07/28/24  Last refill 02/03/23 Qty #270/3

## 2024-05-05 ENCOUNTER — Encounter: Payer: Self-pay | Admitting: Family Medicine

## 2024-05-05 ENCOUNTER — Ambulatory Visit: Payer: Self-pay

## 2024-05-05 ENCOUNTER — Ambulatory Visit (INDEPENDENT_AMBULATORY_CARE_PROVIDER_SITE_OTHER): Admitting: Physician Assistant

## 2024-05-05 ENCOUNTER — Encounter: Payer: Self-pay | Admitting: Physician Assistant

## 2024-05-05 VITALS — BP 155/91 | HR 84 | Temp 97.6°F | Resp 18 | Ht 65.0 in | Wt 120.2 lb

## 2024-05-05 DIAGNOSIS — I10 Essential (primary) hypertension: Secondary | ICD-10-CM | POA: Diagnosis not present

## 2024-05-05 MED ORDER — LOSARTAN POTASSIUM 25 MG PO TABS: 25.0000 mg | ORAL_TABLET | Freq: Every day | ORAL | 1 refills | Status: AC

## 2024-05-05 NOTE — Progress Notes (Signed)
 "  Acute Office Visit  Subjective:     Patient ID: [name], female    DOB: Aug 16, 1940, 84 y.o.   MRN: [id number]  Chief Complaint  Patient presents with   Acute Visit    HPI Patient is in today for BP check.  She was seen by cardiology 03/25/2024.  At that time, she had noted fatigue.  BP was 84/60 mmHg.  She stated that it is not uncommon and that her BP at home is typically 90s and low 100s.  Recent echocardiogram obtained 03/21/2024 showed improved LVEF to 60 to 65%.  She does have CKD subsequent to kidney cancer with most recent GFR 44.48.  Her fenofibrate  was decreased given low GFR and plan is for her to recheck lipids with primary care team 06/2024.  Additionally, she was advised to stop the Entresto  given that is her only antihypertensive medication.  Patient reports that she has been monitoring her blood pressure daily and noticed that for the past week she has been having elevated readings 150-160 systolic over 80-110 diastolic.  This is all subsequent to discontinuation of the Entresto , but she admits that her elevated pressures are likely compounded by court proceedings for divorce settlement.  She also reports that she is recovering from a wrist fracture subsequent to mechanical fall which has prevented her from participating in yoga which she reports helps with stress reduction.  Patient notices a thumping heartbeat in her right ear periodically at night that she attributes to her elevated blood pressure, but denies any associated chest discomfort, weakness, or dyspnea.  She takes Slow FE every other day for iron supplementation.    ROS See HPI, all else negative.      Objective:    BP (!) 155/91   Pulse 84   Temp 97.6 F (36.4 C)   Resp 18   Ht 5' 5 (1.651 m)   Wt 120 lb 3.2 oz (54.5 kg)   SpO2 99%   BMI 20.00 kg/m    Physical Exam Constitutional:      Appearance: Normal appearance.  Eyes:     Extraocular Movements: Extraocular movements intact.   Cardiovascular:     Rate and Rhythm: Normal rate and regular rhythm.     Pulses: Normal pulses.     Heart sounds: Normal heart sounds.  Pulmonary:     Effort: Pulmonary effort is normal.     Breath sounds: Normal breath sounds.  Musculoskeletal:        General: Normal range of motion.     Cervical back: Normal range of motion.  Neurological:     Mental Status: She is alert and oriented to person, place, and time.  Psychiatric:        Behavior: Behavior normal.     No results found for any visits on 05/05/24.      Assessment & Plan:   Problem List Items Addressed This Visit       Cardiovascular and Mediastinum   Hypertension - Primary   Relevant Medications   losartan  (COZAAR ) 25 MG tablet    Assessment and Plan Assessment & Plan Primary hypertension - Blood pressure elevated after stopping Entresto  due to hypotension and fall risk. Stressors include personal issues involving ex-husband and inability to perform yoga due to recent wrist fracture.  - Target BP is 120/80 mmHg, with acceptable systolic range of 110-120 mmHg. Risk of hypotension and falls noted. - Prescribed losartan , start with half a 25 mg pill daily for 3-5 days. If  no improvement, increase to full 25 mg daily. - Monitor blood pressure regularly. - Adjust dosage if BP decreases after stress from court proceedings subsides and she resumes yoga. - Return to clinic if persistent difficulty with blood pressure control or if she develops any new or concerning symptoms  History of chemotherapy-induced heart failure - Heart failure secondary to chemotherapy-induced cardiotoxicity resolved with normalized ejection fraction.     Meds ordered this encounter  Medications   losartan  (COZAAR ) 25 MG tablet    Sig: Take 1 tablet (25 mg total) by mouth daily. Start by cutting in half and taking 1/2 tablet for the first 3-5 days. If no improvement in BP control, begin taking the full dose. Call us  if you continue to  have issues controlling blood pressure after a couple of weeks.    Dispense:  90 tablet    Refill:  1    No follow-ups on file.  Honora Seip, PA-C   "

## 2024-05-05 NOTE — Telephone Encounter (Signed)
 FYI Only or Action Required?: FYI only for provider: appointment scheduled on 1/22.  Patient was last seen in primary care on 12/23/2023 by Copland, Harlene BROCKS, MD.  Called Nurse Triage reporting Hypertension.  Symptoms began several days ago.  Interventions attempted: Nothing.  Symptoms are: unchanged.  Triage Disposition: See Physician Within 24 Hours  Patient/caregiver understands and will follow disposition?: Yes, will follow disposition  Reason for Triage: bp has been high for 4 days 151-105 sometimes has been hearing her HB in her ear   Reason for Disposition  Systolic BP >= 180 OR Diastolic >= 110  Answer Assessment - Initial Assessment Questions 1. BLOOD PRESSURE: What is your blood pressure? Did you take at least two measurements 5 minutes apart?     151/105, 145/118 2. ONSET: When did you take your blood pressure?     This morning and again during call 3. HOW: How did you take your blood pressure? (e.g., automatic home BP monitor, visiting nurse)     auto 4. HISTORY: Do you have a history of high blood pressure?     yes 5. MEDICINES: Are you taking any medicines for blood pressure? Have you missed any doses recently?    No medication on the list, pt states that she takes eliquis for her BP, but has recently stopped taking  6. OTHER SYMPTOMS: Do you have any symptoms? (e.g., blurred vision, chest pain, difficulty breathing, headache, weakness)     When resting can hear heart beat in ears  Protocols used: Blood Pressure - High-A-AH

## 2024-05-05 NOTE — Telephone Encounter (Signed)
 Appt scheduled

## 2024-05-06 ENCOUNTER — Other Ambulatory Visit: Payer: Self-pay | Admitting: Family Medicine

## 2024-05-07 ENCOUNTER — Other Ambulatory Visit: Payer: Self-pay | Admitting: Family Medicine

## 2024-05-07 DIAGNOSIS — E785 Hyperlipidemia, unspecified: Secondary | ICD-10-CM

## 2024-06-22 ENCOUNTER — Ambulatory Visit: Admitting: Family Medicine

## 2024-06-30 ENCOUNTER — Ambulatory Visit

## 2024-06-30 ENCOUNTER — Ambulatory Visit: Admitting: Family Medicine

## 2024-07-28 ENCOUNTER — Ambulatory Visit: Admitting: Neurology
